# Patient Record
Sex: Female | Born: 1945 | Race: Black or African American | Hispanic: No | Marital: Married | Smoking: Former smoker
Health system: Southern US, Community
[De-identification: ages and names within clinical notes are randomized; demographics above are authoritative.]

## PROBLEM LIST (undated history)

## (undated) DIAGNOSIS — S62102A Fracture of unspecified carpal bone, left wrist, initial encounter for closed fracture: Secondary | ICD-10-CM

## (undated) DIAGNOSIS — M109 Gout, unspecified: Secondary | ICD-10-CM

## (undated) DIAGNOSIS — F0390 Unspecified dementia without behavioral disturbance: Secondary | ICD-10-CM

## (undated) DIAGNOSIS — S62101A Fracture of unspecified carpal bone, right wrist, initial encounter for closed fracture: Secondary | ICD-10-CM

## (undated) DIAGNOSIS — W19XXXA Unspecified fall, initial encounter: Secondary | ICD-10-CM

## (undated) DIAGNOSIS — S02839A Fracture of medial orbital wall, unspecified side, initial encounter for closed fracture: Secondary | ICD-10-CM

## (undated) DIAGNOSIS — I639 Cerebral infarction, unspecified: Secondary | ICD-10-CM

## (undated) DIAGNOSIS — I10 Essential (primary) hypertension: Secondary | ICD-10-CM

## (undated) DIAGNOSIS — R32 Unspecified urinary incontinence: Secondary | ICD-10-CM

## (undated) HISTORY — PX: OTHER SURGICAL HISTORY: SHX169

## (undated) HISTORY — DX: Fracture of medial orbital wall, unspecified side, initial encounter for closed fracture: S02.839A

## (undated) HISTORY — DX: Unspecified fall, initial encounter: W19.XXXA

## (undated) HISTORY — PX: VAGINAL HYSTERECTOMY: SUR661

---

## 1999-06-05 ENCOUNTER — Encounter: Admission: RE | Admit: 1999-06-05 | Discharge: 1999-06-05 | Payer: Self-pay | Admitting: Internal Medicine

## 1999-06-05 ENCOUNTER — Encounter: Payer: Self-pay | Admitting: Internal Medicine

## 2000-06-17 ENCOUNTER — Encounter: Payer: Self-pay | Admitting: Obstetrics and Gynecology

## 2000-06-17 ENCOUNTER — Other Ambulatory Visit: Admission: RE | Admit: 2000-06-17 | Discharge: 2000-06-17 | Payer: Self-pay | Admitting: *Deleted

## 2000-06-17 ENCOUNTER — Encounter: Admission: RE | Admit: 2000-06-17 | Discharge: 2000-06-17 | Payer: Self-pay | Admitting: Obstetrics and Gynecology

## 2001-08-23 ENCOUNTER — Encounter: Payer: Self-pay | Admitting: Neurology

## 2001-08-23 ENCOUNTER — Encounter: Payer: Self-pay | Admitting: Emergency Medicine

## 2001-08-23 ENCOUNTER — Inpatient Hospital Stay (HOSPITAL_COMMUNITY): Admission: EM | Admit: 2001-08-23 | Discharge: 2001-09-05 | Payer: Self-pay | Admitting: Emergency Medicine

## 2001-08-24 ENCOUNTER — Encounter: Payer: Self-pay | Admitting: Neurology

## 2001-08-25 ENCOUNTER — Encounter: Payer: Self-pay | Admitting: Neurology

## 2001-08-26 ENCOUNTER — Encounter: Payer: Self-pay | Admitting: Surgery

## 2001-08-27 ENCOUNTER — Encounter: Payer: Self-pay | Admitting: Surgery

## 2001-08-28 ENCOUNTER — Encounter: Payer: Self-pay | Admitting: Cardiothoracic Surgery

## 2001-08-29 ENCOUNTER — Encounter: Payer: Self-pay | Admitting: Cardiothoracic Surgery

## 2001-08-29 ENCOUNTER — Encounter: Payer: Self-pay | Admitting: Neurology

## 2001-09-04 ENCOUNTER — Encounter: Payer: Self-pay | Admitting: Surgery

## 2001-09-05 ENCOUNTER — Inpatient Hospital Stay (HOSPITAL_COMMUNITY)
Admission: RE | Admit: 2001-09-05 | Discharge: 2001-09-21 | Payer: Self-pay | Admitting: Physical Medicine & Rehabilitation

## 2001-10-03 ENCOUNTER — Encounter
Admission: RE | Admit: 2001-10-03 | Discharge: 2002-01-01 | Payer: Self-pay | Admitting: Physical Medicine & Rehabilitation

## 2002-09-07 ENCOUNTER — Encounter: Payer: Self-pay | Admitting: Internal Medicine

## 2002-09-07 ENCOUNTER — Encounter: Admission: RE | Admit: 2002-09-07 | Discharge: 2002-09-07 | Payer: Self-pay | Admitting: Internal Medicine

## 2003-11-28 ENCOUNTER — Ambulatory Visit (HOSPITAL_COMMUNITY): Admission: RE | Admit: 2003-11-28 | Discharge: 2003-11-28 | Payer: Self-pay | Admitting: General Surgery

## 2005-03-12 ENCOUNTER — Encounter: Admission: RE | Admit: 2005-03-12 | Discharge: 2005-03-12 | Payer: Self-pay | Admitting: Family Medicine

## 2006-08-23 ENCOUNTER — Inpatient Hospital Stay (HOSPITAL_COMMUNITY): Admission: RE | Admit: 2006-08-23 | Discharge: 2006-08-25 | Payer: Self-pay | Admitting: Urology

## 2011-12-18 ENCOUNTER — Other Ambulatory Visit: Payer: Self-pay | Admitting: *Deleted

## 2011-12-18 DIAGNOSIS — N939 Abnormal uterine and vaginal bleeding, unspecified: Secondary | ICD-10-CM

## 2011-12-21 ENCOUNTER — Ambulatory Visit
Admission: RE | Admit: 2011-12-21 | Discharge: 2011-12-21 | Disposition: A | Discharge status: Home / Self Care | Payer: Medicare Other | Source: Ambulatory Visit | Attending: *Deleted | Admitting: *Deleted

## 2011-12-21 ENCOUNTER — Other Ambulatory Visit: Payer: Self-pay | Admitting: Diagnostic Neuroimaging

## 2011-12-21 DIAGNOSIS — N939 Abnormal uterine and vaginal bleeding, unspecified: Secondary | ICD-10-CM

## 2011-12-22 ENCOUNTER — Other Ambulatory Visit: Payer: Self-pay | Admitting: Diagnostic Neuroimaging

## 2011-12-22 DIAGNOSIS — F0390 Unspecified dementia without behavioral disturbance: Secondary | ICD-10-CM

## 2011-12-25 ENCOUNTER — Ambulatory Visit
Admission: RE | Admit: 2011-12-25 | Discharge: 2011-12-25 | Disposition: A | Discharge status: Home / Self Care | Payer: Medicare Other | Source: Ambulatory Visit | Attending: Diagnostic Neuroimaging | Admitting: Diagnostic Neuroimaging

## 2011-12-25 DIAGNOSIS — F0390 Unspecified dementia without behavioral disturbance: Secondary | ICD-10-CM

## 2012-05-17 ENCOUNTER — Inpatient Hospital Stay (HOSPITAL_COMMUNITY)
Admission: EM | Admit: 2012-05-17 | Discharge: 2012-05-21 | DRG: 563 | Disposition: A | Discharge status: Skilled Nursing Facility | Payer: Medicare Other | Attending: Internal Medicine | Admitting: Internal Medicine

## 2012-05-17 ENCOUNTER — Emergency Department (HOSPITAL_COMMUNITY): Payer: Medicare Other

## 2012-05-17 ENCOUNTER — Encounter (HOSPITAL_COMMUNITY): Payer: Self-pay | Admitting: Physical Medicine and Rehabilitation

## 2012-05-17 DIAGNOSIS — I129 Hypertensive chronic kidney disease with stage 1 through stage 4 chronic kidney disease, or unspecified chronic kidney disease: Secondary | ICD-10-CM | POA: Diagnosis present

## 2012-05-17 DIAGNOSIS — N189 Chronic kidney disease, unspecified: Secondary | ICD-10-CM | POA: Diagnosis present

## 2012-05-17 DIAGNOSIS — D72829 Elevated white blood cell count, unspecified: Secondary | ICD-10-CM | POA: Diagnosis present

## 2012-05-17 DIAGNOSIS — W19XXXA Unspecified fall, initial encounter: Secondary | ICD-10-CM | POA: Diagnosis present

## 2012-05-17 DIAGNOSIS — S52501A Unspecified fracture of the lower end of right radius, initial encounter for closed fracture: Secondary | ICD-10-CM

## 2012-05-17 DIAGNOSIS — W06XXXA Fall from bed, initial encounter: Secondary | ICD-10-CM | POA: Diagnosis present

## 2012-05-17 DIAGNOSIS — S0003XA Contusion of scalp, initial encounter: Secondary | ICD-10-CM | POA: Diagnosis present

## 2012-05-17 DIAGNOSIS — N183 Chronic kidney disease, stage 3 unspecified: Secondary | ICD-10-CM | POA: Diagnosis present

## 2012-05-17 DIAGNOSIS — S52599A Other fractures of lower end of unspecified radius, initial encounter for closed fracture: Principal | ICD-10-CM | POA: Diagnosis present

## 2012-05-17 DIAGNOSIS — S02839A Fracture of medial orbital wall, unspecified side, initial encounter for closed fracture: Secondary | ICD-10-CM | POA: Diagnosis present

## 2012-05-17 DIAGNOSIS — F03918 Unspecified dementia, unspecified severity, with other behavioral disturbance: Secondary | ICD-10-CM | POA: Diagnosis present

## 2012-05-17 DIAGNOSIS — N289 Disorder of kidney and ureter, unspecified: Secondary | ICD-10-CM

## 2012-05-17 DIAGNOSIS — F0391 Unspecified dementia with behavioral disturbance: Secondary | ICD-10-CM | POA: Diagnosis present

## 2012-05-17 DIAGNOSIS — S0280XA Fracture of other specified skull and facial bones, unspecified side, initial encounter for closed fracture: Secondary | ICD-10-CM | POA: Diagnosis present

## 2012-05-17 DIAGNOSIS — F0390 Unspecified dementia without behavioral disturbance: Secondary | ICD-10-CM

## 2012-05-17 DIAGNOSIS — S62101A Fracture of unspecified carpal bone, right wrist, initial encounter for closed fracture: Secondary | ICD-10-CM | POA: Diagnosis present

## 2012-05-17 DIAGNOSIS — I160 Hypertensive urgency: Secondary | ICD-10-CM

## 2012-05-17 DIAGNOSIS — F17200 Nicotine dependence, unspecified, uncomplicated: Secondary | ICD-10-CM | POA: Diagnosis present

## 2012-05-17 DIAGNOSIS — I10 Essential (primary) hypertension: Secondary | ICD-10-CM | POA: Diagnosis present

## 2012-05-17 DIAGNOSIS — N179 Acute kidney failure, unspecified: Secondary | ICD-10-CM | POA: Diagnosis present

## 2012-05-17 DIAGNOSIS — S52502A Unspecified fracture of the lower end of left radius, initial encounter for closed fracture: Secondary | ICD-10-CM

## 2012-05-17 HISTORY — DX: Unspecified fall, initial encounter: W19.XXXA

## 2012-05-17 HISTORY — DX: Fracture of unspecified carpal bone, right wrist, initial encounter for closed fracture: S62.101A

## 2012-05-17 HISTORY — DX: Unspecified urinary incontinence: R32

## 2012-05-17 HISTORY — DX: Cerebral infarction, unspecified: I63.9

## 2012-05-17 HISTORY — DX: Unspecified dementia, unspecified severity, without behavioral disturbance, psychotic disturbance, mood disturbance, and anxiety: F03.90

## 2012-05-17 HISTORY — DX: Essential (primary) hypertension: I10

## 2012-05-17 HISTORY — DX: Gout, unspecified: M10.9

## 2012-05-17 HISTORY — DX: Fracture of medial orbital wall, unspecified side, initial encounter for closed fracture: S02.839A

## 2012-05-17 HISTORY — DX: Fracture of unspecified carpal bone, left wrist, initial encounter for closed fracture: S62.102A

## 2012-05-17 LAB — URINALYSIS, ROUTINE W REFLEX MICROSCOPIC
Bilirubin Urine: NEGATIVE
Glucose, UA: NEGATIVE mg/dL

## 2012-05-17 LAB — CBC WITH DIFFERENTIAL/PLATELET
Basophils Relative: 0 % (ref 0–1)
Eosinophils Absolute: 0.1 10*3/uL (ref 0.0–0.7)
Eosinophils Relative: 0 % (ref 0–5)
Lymphocytes Relative: 11 % — ABNORMAL LOW (ref 12–46)
MCH: 30.2 pg (ref 26.0–34.0)
Monocytes Relative: 4 % (ref 3–12)
RBC: 4.21 MIL/uL (ref 3.87–5.11)
RDW: 12.8 % (ref 11.5–15.5)
WBC: 12 10*3/uL — ABNORMAL HIGH (ref 4.0–10.5)

## 2012-05-17 LAB — COMPREHENSIVE METABOLIC PANEL
Albumin: 4.5 g/dL (ref 3.5–5.2)
BUN: 26 mg/dL — ABNORMAL HIGH (ref 6–23)
Calcium: 9.9 mg/dL (ref 8.4–10.5)
Chloride: 104 mEq/L (ref 96–112)
Creatinine, Ser: 1.28 mg/dL — ABNORMAL HIGH (ref 0.50–1.10)
GFR calc Af Amer: 49 mL/min — ABNORMAL LOW (ref >90)
GFR calc non Af Amer: 42 mL/min — ABNORMAL LOW (ref >90)
Total Bilirubin: 0.5 mg/dL (ref 0.3–1.2)
Total Protein: 7.8 g/dL (ref 6.0–8.3)

## 2012-05-17 LAB — PROTIME-INR
INR: 1.07 (ref 0.00–1.49)
Prothrombin Time: 13.8 seconds (ref 11.6–15.2)

## 2012-05-17 MED ORDER — MEMANTINE HCL ER 28 MG PO CP24: 28.0000 mg | ORAL_CAPSULE | Freq: Every day | ORAL | Status: DC

## 2012-05-17 MED ORDER — ACETAMINOPHEN 325 MG PO TABS: 650.0000 mg | ORAL_TABLET | Freq: Four times a day (QID) | ORAL | Status: DC | PRN

## 2012-05-17 MED ORDER — ONDANSETRON HCL 4 MG PO TABS: 4.0000 mg | ORAL_TABLET | Freq: Four times a day (QID) | ORAL | Status: DC | PRN

## 2012-05-17 MED ORDER — LABETALOL HCL 5 MG/ML IV SOLN: 20.0000 mg | Freq: Once | INTRAVENOUS | Status: DC

## 2012-05-17 MED ORDER — HYDRALAZINE HCL 20 MG/ML IJ SOLN
10.0000 mg | Freq: Four times a day (QID) | INTRAMUSCULAR | Status: DC | PRN
  Administered 2012-05-17: 0.5 mg via INTRAVENOUS
  Administered 2012-05-19: 10 mg via INTRAVENOUS
  Filled 2012-05-17 (×3): qty 0.5

## 2012-05-17 MED ORDER — DONEPEZIL HCL 10 MG PO TABS
10.0000 mg | ORAL_TABLET | Freq: Every day | ORAL | Status: DC
  Administered 2012-05-18 – 2012-05-20 (×3): 10 mg via ORAL
  Filled 2012-05-17 (×5): qty 1

## 2012-05-17 MED ORDER — ZIPRASIDONE MESYLATE 20 MG IM SOLR
10.0000 mg | Freq: Once | INTRAMUSCULAR | Status: AC
  Administered 2012-05-17: 10 mg via INTRAMUSCULAR

## 2012-05-17 MED ORDER — ONDANSETRON HCL 4 MG/2ML IJ SOLN
4.0000 mg | Freq: Once | INTRAMUSCULAR | Status: AC
  Administered 2012-05-17: 4 mg via INTRAVENOUS

## 2012-05-17 MED ORDER — SODIUM CHLORIDE 0.9 % IV SOLN
INTRAVENOUS | Status: AC
  Administered 2012-05-17 – 2012-05-18 (×2): via INTRAVENOUS

## 2012-05-17 MED ORDER — MORPHINE SULFATE 4 MG/ML IJ SOLN
INTRAMUSCULAR | Status: AC
  Filled 2012-05-17: qty 1

## 2012-05-17 MED ORDER — ZIPRASIDONE MESYLATE 20 MG IM SOLR
10.0000 mg | Freq: Once | INTRAMUSCULAR | Status: AC
  Administered 2012-05-17: 10 mg via INTRAMUSCULAR
  Filled 2012-05-17: qty 20

## 2012-05-17 MED ORDER — ACETAMINOPHEN 650 MG RE SUPP: 650.0000 mg | Freq: Four times a day (QID) | RECTAL | Status: DC | PRN

## 2012-05-17 MED ORDER — MORPHINE SULFATE 4 MG/ML IJ SOLN
4.0000 mg | Freq: Once | INTRAMUSCULAR | Status: AC
  Administered 2012-05-17: 4 mg via INTRAVENOUS

## 2012-05-17 MED ORDER — ONDANSETRON HCL 4 MG/2ML IJ SOLN: 4.0000 mg | Freq: Four times a day (QID) | INTRAMUSCULAR | Status: DC | PRN

## 2012-05-17 MED ORDER — MORPHINE SULFATE 2 MG/ML IJ SOLN
1.0000 mg | INTRAMUSCULAR | Status: DC | PRN
  Administered 2012-05-20 – 2012-05-21 (×2): 2 mg via INTRAVENOUS
  Filled 2012-05-17 (×2): qty 1

## 2012-05-17 MED ORDER — SODIUM CHLORIDE 0.9 % IV SOLN: INTRAVENOUS | Status: DC

## 2012-05-17 MED ORDER — LORAZEPAM 2 MG/ML IJ SOLN
1.0000 mg | Freq: Four times a day (QID) | INTRAMUSCULAR | Status: DC | PRN
  Administered 2012-05-17 – 2012-05-20 (×5): 1 mg via INTRAVENOUS
  Filled 2012-05-17 (×5): qty 1

## 2012-05-17 MED ORDER — LABETALOL HCL 300 MG PO TABS
300.0000 mg | ORAL_TABLET | Freq: Two times a day (BID) | ORAL | Status: DC
  Administered 2012-05-18 – 2012-05-21 (×7): 300 mg via ORAL
  Filled 2012-05-17 (×11): qty 1

## 2012-05-17 MED ORDER — SODIUM CHLORIDE 0.9 % IV SOLN: Freq: Once | INTRAVENOUS | Status: DC

## 2012-05-17 MED ORDER — LIDOCAINE HCL (PF) 1 % IJ SOLN
10.0000 mL | Freq: Once | INTRAMUSCULAR | Status: AC
  Administered 2012-05-17: 10 mL

## 2012-05-17 MED ORDER — MEMANTINE HCL 10 MG PO TABS
10.0000 mg | ORAL_TABLET | Freq: Two times a day (BID) | ORAL | Status: DC
  Administered 2012-05-18 – 2012-05-21 (×7): 10 mg via ORAL
  Filled 2012-05-17 (×9): qty 1

## 2012-05-17 MED ORDER — SENNOSIDES-DOCUSATE SODIUM 8.6-50 MG PO TABS: 1.0000 | ORAL_TABLET | Freq: Every evening | ORAL | Status: DC | PRN

## 2012-05-17 MED ORDER — CLONIDINE HCL 0.1 MG PO TABS
0.1000 mg | ORAL_TABLET | Freq: Two times a day (BID) | ORAL | Status: DC
  Administered 2012-05-18 – 2012-05-21 (×7): 0.1 mg via ORAL
  Filled 2012-05-17 (×9): qty 1

## 2012-05-17 MED ORDER — ONDANSETRON HCL 4 MG/2ML IJ SOLN
INTRAMUSCULAR | Status: AC
  Filled 2012-05-17: qty 2

## 2012-05-17 MED ORDER — ATORVASTATIN CALCIUM 10 MG PO TABS
10.0000 mg | ORAL_TABLET | Freq: Every day | ORAL | Status: DC
  Administered 2012-05-18 – 2012-05-21 (×4): 10 mg via ORAL
  Filled 2012-05-17 (×6): qty 1

## 2012-05-17 MED ORDER — LIDOCAINE HCL (PF) 1 % IJ SOLN
INTRAMUSCULAR | Status: AC
  Filled 2012-05-17: qty 5

## 2012-05-17 NOTE — Progress Notes (Signed)
Orthopedic Tech Progress Note Patient Details:  Amanda [name] 1946/03/19 [id number] Bilateral splints applied to wrists. Left: Thumb spica and ulna-gutter Right:Thumb spica and dorsal volar. Family present in room with patient. Ortho Devices Type of Ortho Device: Ulna gutter splint;Thumb spica splint;Volar splint Splint Material: Fiberglass Ortho Device/Splint Location: Bilateral Ortho Device/Splint Interventions: Application   Asia R Thompson 05/17/2012, 12:41 PM

## 2012-05-17 NOTE — ED Notes (Addendum)
Pt presents to department for evaluation of fall. Was found by son lying on floor near stairs this morning. Abrasion to forehead and L eyelid. Swelling also noted to L eye. Obvious deformity/swelling noted to L wrist. Able to move all extremities. Can answer simple questions without difficulty. Son denies any mental status changes, states "she is acting like her usual self." pt does not take blood thinners.

## 2012-05-17 NOTE — ED Notes (Signed)
Pt sitting on stretcher with family at bedside. Pt continues to yell out stating "get me off this pot, I'm done" Pt referring to bed side toilet that was removed from her room earlier.

## 2012-05-17 NOTE — Progress Notes (Signed)
Orthopedic Tech Progress Note Patient Details:  Amanda [name] 1946/03/24 [id number] Sugartong splint applied to Left wrist/arm after re-set. Doctor in room to set arm and assist with application of splint. Nurse also present in room.  Ortho Devices Type of Ortho Device: Sugartong splint Splint Material: Plaster Ortho Device/Splint Location: Left Ortho Device/Splint Interventions: Application   Asia R Thompson 05/17/2012, 2:30 PM

## 2012-05-17 NOTE — ED Notes (Addendum)
Family leaving and will return Amanda [name], husband,  [phone]

## 2012-05-17 NOTE — ED Notes (Signed)
Dr.Kuzma in room at this time. Ortho tech called

## 2012-05-17 NOTE — ED Notes (Signed)
Pt agitated and aggressive toward staff. Refuses IV and attempting to get OOB. EDP notified. Pt using BUE. Lt wrist swollen with deformity, pt refuses to allow RN to check vascular status of Lt wrist/hand.

## 2012-05-17 NOTE — ED Provider Notes (Signed)
History     CSN: [id number]  Arrival date & time 05/17/12  0800   First MD Initiated Contact with Patient 05/17/12 [phone]      Chief Complaint  Patient presents with  . Fall    (Consider location/radiation/quality/duration/timing/severity/associated sxs/prior treatment) HPI Comments: Pt's family says that pt fell this morning, suffering abrasions of the left side of the face, has pain in both wrists.  She reportedly is in early stages of dementia.  Patient is a 66 y.o. female presenting with fall.  Fall The accident occurred less than 1 hour ago. She fell from a height of 1 to 2 ft. She landed on carpet. The volume of blood lost was minimal. Point of impact: Face, both wrists. Pain scale: Does not appear to be in pain, does not complain of pain. She was not ambulatory at the scene. There was no entrapment after the fall. There was no drug use involved in the accident. There was no alcohol use involved in the accident. She has tried nothing (To [facility] via private car.) for the symptoms.    Past Medical History  Diagnosis Date  . Hypertension   . Dementia     No past surgical history on file.  No family history on file.  History  Substance Use Topics  . Smoking status: Never Smoker   . Smokeless tobacco: Not on file  . Alcohol Use: No    OB History    Grav Para Term Preterm Abortions TAB SAB Ect Mult Living                  Review of Systems  Unable to perform ROS: Dementia    Allergies  Review of patient's allergies indicates no known allergies.  Home Medications   Current Outpatient Rx  Name Route Sig Dispense Refill  . ATORVASTATIN CALCIUM 10 MG PO TABS Oral Take 10 mg by mouth daily.    Marland Kitchen CLONIDINE HCL 0.1 MG PO TABS Oral Take 0.1 mg by mouth 2 (two) times daily.    . DONEPEZIL HCL 10 MG PO TABS Oral Take 10 mg by mouth at bedtime.    Marland Kitchen LABETALOL HCL 300 MG PO TABS Oral Take 300 mg by mouth 2 (two) times daily.    Marland Kitchen MEMANTINE HCL ER 28 MG PO CP24  Oral Take 28 mg by mouth daily.      BP 193/87  Pulse 62  Resp 14  SpO2 100%  Physical Exam  Nursing note and vitals reviewed. Constitutional: She appears well-developed and well-nourished. No distress.       Has abrasion involving the left forehead, left upper and lower eyelids, with swelling and ecchymosis in the area of the left orbit.  No intrinsic left eye injury apparent.  HENT:  Head: Normocephalic and atraumatic.  Right Ear: External ear normal.  Left Ear: External ear normal.  Mouth/Throat: Oropharynx is clear and moist.  Eyes: EOM are normal. Pupils are equal, round, and reactive to light.  Neck: Normal range of motion. Neck supple.  Cardiovascular: Normal rate, regular rhythm and normal heart sounds.   Pulmonary/Chest: Effort normal and breath sounds normal.  Abdominal: Soft. Bowel sounds are normal. There is no tenderness. There is no rebound and no guarding.  Musculoskeletal:       She has an obvious fracture of the left distal radius with dorsal angulation.  She also has mild swelling over the dorsum of the right distal radius.   Neurological: She is alert.  Awake, answers some questions. No sensory or motor deficit.  Skin: Skin is warm and dry.  Psychiatric: She has a normal mood and affect. Her behavior is normal.    ED Course  Procedures (including critical care time)   Labs Reviewed  CBC WITH DIFFERENTIAL  COMPREHENSIVE METABOLIC PANEL  URINALYSIS, ROUTINE W REFLEX MICROSCOPIC  URINE CULTURE  PROTIME-INR  APTT   Dg Chest 2 View  05/17/2012  *RADIOLOGY REPORT*  Clinical Data: Fall, hypertension, smoker, dementia  CHEST - 2 VIEW  Comparison: 08/23/2006  Findings: Enlargement of cardiac silhouette post median sternotomy. Calcified tortuous thoracic aorta with question aneurysmal dilatation of the aortic arch. Prominent right superior mediastinal soft tissues unchanged, potentially related to a tortuous innominate artery. Pulmonary vascularity normal.  Lungs clear. No pleural effusion or pneumothorax. Diffuse osseous demineralization.  IMPRESSION: Enlargement of cardiac silhouette post median sternotomy. Atherosclerotic changes of the thoracic aorta with question mild aneurysmal dilatation at the aortic arch.   Original Report Authenticated By: Lollie Marrow, M.D.    Dg Wrist Complete Left  05/17/2012  *RADIOLOGY REPORT*  Clinical Data:  Left wrist pain post fall  LEFT WRIST - COMPLETE 3+ VIEW  Comparison: None  Findings: Diffuse osseous demineralization. Displaced ulnar styloid fracture. Comminuted displaced intra-articular fracture distal left radial metaphysis with intra-articular extension into the radiocarpal and questionably distal radioulnar joints. Dorsal displacement of the distal radial fragments with apex volar angulation. Associated soft tissue swelling. No definite carpal fractures identified.  IMPRESSION: Displaced ulnar styloid fracture. Displaced angulated comminuted intra-articular distal left radial metaphyseal fracture.   Original Report Authenticated By: Lollie Marrow, M.D.    Dg Wrist Complete Right  05/17/2012  *RADIOLOGY REPORT*  Clinical Data: Fall, bilateral wrist injuries, struck right frontal region, history dementia, hypertension  RIGHT WRIST - COMPLETE 3+ VIEW  Comparison: None  Findings: Osseous demineralization. Comminuted metaphyseal fracture distal right radius with dorsal displacement of distal radial dorsal fragments. Overlying soft tissue swelling at the distal forearm and carpus. Joint spaces preserved. No additional fracture or dislocation identified.  IMPRESSION: Minimally displaced and comminuted distal right radial metaphyseal fracture. Osseous demineralization.   Original Report Authenticated By: Lollie Marrow, M.D.    Ct Head Wo Contrast  05/17/2012  *RADIOLOGY REPORT*  Clinical Data:  Dementia, fall, found on floor, frontal abrasion, left periorbital swelling  CT HEAD WITHOUT CONTRAST CT CERVICAL SPINE WITHOUT  CONTRAST  Technique:  Multidetector CT imaging of the head and cervical spine was performed following the standard protocol without intravenous contrast.  Multiplanar CT image reconstructions of the cervical spine were also generated.  Comparison:  CT head 12/25/2011  CT HEAD  Findings: Generalized atrophy. Stable ventricular morphology. No midline shift or mass effect. Small vessel chronic ischemic changes of deep cerebral white matter. 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 No intracranial hemorrhage, mass lesion or evidence of acute infarction. Small frontal scalp hematoma. Minimal motion artifacts at vertex, for which repeat imaging was performed. Atherosclerotic calcifications at skull base. Partially opacified ethmoid air cells and left frontal sinus with displaced fracture of the medial wall of the left orbit. Inferior aspect of left orbit not imaged. No definite calvarial fracture identified.  IMPRESSION: Atrophy with small vessel chronic ischemic changes of deep cerebral white matter. 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 No acute intracranial abnormalities. Displaced fracture medial wall left orbit, new.  CT CERVICAL SPINE  Findings: Prevertebral soft tissues normal thickness. Multilevel disc space narrowing and endplate spur formation. Multilevel facet degenerative changes bilaterally greatest at left  C3-C4. Vertebral body heights maintained without fracture or subluxation. Visualized skull base intact. Lung apices clear. Scattered atherosclerotic calcifications in the carotid systems bilaterally.  IMPRESSION:  Degenerative disc and facet disease changes of the cervical spine as above. No acute cervical spine abnormalities.   Original Report Authenticated By: Lollie Marrow, M.D.    Ct Cervical Spine Wo Contrast  05/17/2012  *RADIOLOGY REPORT*  Clinical Data:  Dementia, fall, found on floor, frontal abrasion, left periorbital swelling  CT HEAD WITHOUT CONTRAST CT CERVICAL SPINE WITHOUT CONTRAST  Technique:   Multidetector CT imaging of the head and cervical spine was performed following the standard protocol without intravenous contrast.  Multiplanar CT image reconstructions of the cervical spine were also generated.  Comparison:  CT head 12/25/2011  CT HEAD  Findings: Generalized atrophy. Stable ventricular morphology. No midline shift or mass effect. Small vessel chronic ischemic changes of deep cerebral white matter. 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 No intracranial hemorrhage, mass lesion or evidence of acute infarction. Small frontal scalp hematoma. Minimal motion artifacts at vertex, for which repeat imaging was performed. Atherosclerotic calcifications at skull base. Partially opacified ethmoid air cells and left frontal sinus with displaced fracture of the medial wall of the left orbit. Inferior aspect of left orbit not imaged. No definite calvarial fracture identified.  IMPRESSION: Atrophy with small vessel chronic ischemic changes of deep cerebral white matter. 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 No acute intracranial abnormalities. Displaced fracture medial wall left orbit, new.  CT CERVICAL SPINE  Findings: Prevertebral soft tissues normal thickness. Multilevel disc space narrowing and endplate spur formation. Multilevel facet degenerative changes bilaterally greatest at left C3-C4. Vertebral body heights maintained without fracture or subluxation. Visualized skull base intact. Lung apices clear. Scattered atherosclerotic calcifications in the carotid systems bilaterally.  IMPRESSION:  Degenerative disc and facet disease changes of the cervical spine as above. No acute cervical spine abnormalities.   Original Report Authenticated By: Lollie Marrow, M.D.      Date: 05/17/2012  Rate: 55  Rhythm: normal sinus rhythm  QRS Axis: normal  Intervals: PR prolonged QRS:  Left ventricular hypertrophy.  Left atrial abnormality  ST/T Wave abnormalities: normal  Conduction Disutrbances:none  Narrative  Interpretation: Abnormal EKG  Old EKG Reviewed: none available     11:13 AM Pt has fractures of both wrists, left with significant displacement.  She has hypertensive urgency with Diastolic BP 112 in room at present.  Will need IV  Labetalol.  11:42 AM Now is agitated and combative.  IM Geodon 10 mg IM ordered.  2:26 PM Pt has had her fractures casted.  Her BP remains high.  Will request admission for her for hypertensive urgency.   1. Hypertensive urgency   2. Fracture of left distal radius   3. Fracture of right distal radius   4. Dementia           Carleene Cooper III, MD 05/17/12 1547  Carleene Cooper III, MD 05/29/12 1336

## 2012-05-17 NOTE — ED Notes (Addendum)
Family at bed side with pt. Ortho teck in room attempting to apply BUE splints. Pt room at nurse station with door and windows open.

## 2012-05-17 NOTE — H&P (Signed)
Patient's PCP: Katy Apo, MD  Chief Complaint: Fall and bilateral wrist pain  History of Present Illness: Amanda [name] is a 66 y.o. African American female with history of hypertension and dementia who presents with the above complaints.  Patient due to her dementia is not able to provide any history.  Most of the history was obtained from patient's husband.  He reports that this morning patient went up a flight of steps to use the bathroom and he believes that on her way back she had a mechanical fall and landed forward on her wrists and left forehead.  Husband heard her fall and hurt her crying out saying that she is unable to get up.  She was brought to the emergency department where on imaging patient had bilateral wrist fracture.  Given her uncontrolled hypertension the hospitalist service was asked to admit the patient for Dr. Nehemiah Settle for further care and management.   Past Medical History  Diagnosis Date  . Hypertension   . Dementia    History reviewed. No pertinent past surgical history. Family History  Problem Relation Age of Onset  . Dementia Mother   . Dementia Father   . Dementia Sister   . Dementia Brother    History   Social History  . Marital Status: Married    Spouse Name: N/A    Number of Children: N/A  . Years of Education: N/A   Occupational History  . Not on file.   Social History Main Topics  . Smoking status: Current Some Day Smoker -- 0.7 packs/day  . Smokeless tobacco: Not on file  . Alcohol Use: No  . Drug Use: No  . Sexually Active:    Other Topics Concern  . Not on file   Social History Narrative  . No narrative on file   Allergies: Review of patient's allergies indicates no known allergies.  Meds: Scheduled Meds:   . sodium chloride   Intravenous Once  . cloNIDine  0.1 mg Oral BID  . labetalol  300 mg Oral BID  . lidocaine  10 mL Other Once  .  morphine injection  4 mg Intravenous Once  . ondansetron (ZOFRAN) IV  4 mg  Intravenous Once  . ziprasidone  10 mg Intramuscular Once  . ziprasidone  10 mg Intramuscular Once  . DISCONTD: labetalol  20 mg Intravenous Once  . DISCONTD: labetalol  20 mg Intravenous Once   Continuous Infusions:   . sodium chloride     PRN Meds:.  Review of Systems: All systems reviewed with the patient and positive as per history of present illness, otherwise all other systems are negative.  Physical Exam: Blood pressure 186/76, pulse 63, resp. rate 10, SpO2 100.00%. General: Awake, Oriented to self, No acute distress. HEENT: EOMI, Moist mucous membranes, bruising and carpet burns over left forehead which extends from the maxilla to the scalp Neck: Supple CV: S1 and S2 Lungs: Clear to ascultation bilaterally Abdomen: Soft, Nontender, Nondistended, +bowel sounds. Ext: Good pulses. Trace edema. No clubbing or cyanosis noted.  Both left and right wrists in bandages. Neuro: Cranial Nerves II-XII grossly intact. Has 5/5 motor strength in upper and lower extremities.  Lab results:  [facility] 05/17/12 1042  NA 138  K 3.6  CL 104  CO2 20  GLUCOSE 104*  BUN 26*  CREATININE 1.28*  CALCIUM 9.9  MG --  PHOS --    Basename 05/17/12 1042  AST 33  ALT 24  ALKPHOS 84  BILITOT 0.5  PROT 7.8  ALBUMIN 4.5   No results found for this basename: LIPASE:2,AMYLASE:2 in the last 72 hours  Basename 05/17/12 1042  WBC 12.0*  NEUTROABS 10.1*  HGB 12.7  HCT 38.3  MCV 91.0  PLT 160   No results found for this basename: CKTOTAL:3,CKMB:3,CKMBINDEX:3,TROPONINI:3 in the last 72 hours No components found with this basename: POCBNP:3 No results found for this basename: DDIMER in the last 72 hours No results found for this basename: HGBA1C:2 in the last 72 hours No results found for this basename: CHOL:2,HDL:2,LDLCALC:2,TRIG:2,CHOLHDL:2,LDLDIRECT:2 in the last 72 hours No results found for this basename: TSH,T4TOTAL,FREET3,T3FREE,THYROIDAB in the last 72 hours No results found for  this basename: VITAMINB12:2,FOLATE:2,FERRITIN:2,TIBC:2,IRON:2,RETICCTPCT:2 in the last 72 hours Imaging results:  Dg Chest 2 View  05/17/2012  *RADIOLOGY REPORT*  Clinical Data: Fall, hypertension, smoker, dementia  CHEST - 2 VIEW  Comparison: 08/23/2006  Findings: Enlargement of cardiac silhouette post median sternotomy. Calcified tortuous thoracic aorta with question aneurysmal dilatation of the aortic arch. Prominent right superior mediastinal soft tissues unchanged, potentially related to a tortuous innominate artery. Pulmonary vascularity normal. Lungs clear. No pleural effusion or pneumothorax. Diffuse osseous demineralization.  IMPRESSION: Enlargement of cardiac silhouette post median sternotomy. Atherosclerotic changes of the thoracic aorta with question mild aneurysmal dilatation at the aortic arch.   Original Report Authenticated By: Lollie Marrow, M.D.    Dg Wrist 2 Views Left  05/17/2012  *RADIOLOGY REPORT*  Clinical Data: Wrist fracture, post reduction  LEFT WRIST - 2 VIEW  Comparison: 05/17/2012  Findings: Cast material noted about the wrist.  Improved alignment of the left distal radius and ulnar styloid fractures.  Mild soft tissue swelling.  IMPRESSION: Near anatomic alignment following reduction of the left distal radius and ulnar styloid fractures   Original Report Authenticated By: Judie Petit. Ruel Favors, M.D.    Dg Wrist Complete Left  05/17/2012  *RADIOLOGY REPORT*  Clinical Data:  Left wrist pain post fall  LEFT WRIST - COMPLETE 3+ VIEW  Comparison: None  Findings: Diffuse osseous demineralization. Displaced ulnar styloid fracture. Comminuted displaced intra-articular fracture distal left radial metaphysis with intra-articular extension into the radiocarpal and questionably distal radioulnar joints. Dorsal displacement of the distal radial fragments with apex volar angulation. Associated soft tissue swelling. No definite carpal fractures identified.  IMPRESSION: Displaced ulnar styloid  fracture. Displaced angulated comminuted intra-articular distal left radial metaphyseal fracture.   Original Report Authenticated By: Lollie Marrow, M.D.    Dg Wrist Complete Right  05/17/2012  *RADIOLOGY REPORT*  Clinical Data: Fall, bilateral wrist injuries, struck right frontal region, history dementia, hypertension  RIGHT WRIST - COMPLETE 3+ VIEW  Comparison: None  Findings: Osseous demineralization. Comminuted metaphyseal fracture distal right radius with dorsal displacement of distal radial dorsal fragments. Overlying soft tissue swelling at the distal forearm and carpus. Joint spaces preserved. No additional fracture or dislocation identified.  IMPRESSION: Minimally displaced and comminuted distal right radial metaphyseal fracture. Osseous demineralization.   Original Report Authenticated By: Lollie Marrow, M.D.    Ct Head Wo Contrast  05/17/2012  *RADIOLOGY REPORT*  Clinical Data:  Dementia, fall, found on floor, frontal abrasion, left periorbital swelling  CT HEAD WITHOUT CONTRAST CT CERVICAL SPINE WITHOUT CONTRAST  Technique:  Multidetector CT imaging of the head and cervical spine was performed following the standard protocol without intravenous contrast.  Multiplanar CT image reconstructions of the cervical spine were also generated.  Comparison:  CT head 12/25/2011  CT HEAD  Findings: Generalized atrophy. Stable ventricular morphology. No midline  shift or mass effect. Small vessel chronic ischemic changes of deep cerebral white matter. 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 No intracranial hemorrhage, mass lesion or evidence of acute infarction. Small frontal scalp hematoma. Minimal motion artifacts at vertex, for which repeat imaging was performed. Atherosclerotic calcifications at skull base. Partially opacified ethmoid air cells and left frontal sinus with displaced fracture of the medial wall of the left orbit. Inferior aspect of left orbit not imaged. No definite calvarial fracture identified.   IMPRESSION: Atrophy with small vessel chronic ischemic changes of deep cerebral white matter. 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 No acute intracranial abnormalities. Displaced fracture medial wall left orbit, new.  CT CERVICAL SPINE  Findings: Prevertebral soft tissues normal thickness. Multilevel disc space narrowing and endplate spur formation. Multilevel facet degenerative changes bilaterally greatest at left C3-C4. Vertebral body heights maintained without fracture or subluxation. Visualized skull base intact. Lung apices clear. Scattered atherosclerotic calcifications in the carotid systems bilaterally.  IMPRESSION:  Degenerative disc and facet disease changes of the cervical spine as above. No acute cervical spine abnormalities.   Original Report Authenticated By: Lollie Marrow, M.D.    Ct Cervical Spine Wo Contrast  05/17/2012  *RADIOLOGY REPORT*  Clinical Data:  Dementia, fall, found on floor, frontal abrasion, left periorbital swelling  CT HEAD WITHOUT CONTRAST CT CERVICAL SPINE WITHOUT CONTRAST  Technique:  Multidetector CT imaging of the head and cervical spine was performed following the standard protocol without intravenous contrast.  Multiplanar CT image reconstructions of the cervical spine were also generated.  Comparison:  CT head 12/25/2011  CT HEAD  Findings: Generalized atrophy. Stable ventricular morphology. No midline shift or mass effect. Small vessel chronic ischemic changes of deep cerebral white matter. 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 No intracranial hemorrhage, mass lesion or evidence of acute infarction. Small frontal scalp hematoma. Minimal motion artifacts at vertex, for which repeat imaging was performed. Atherosclerotic calcifications at skull base. Partially opacified ethmoid air cells and left frontal sinus with displaced fracture of the medial wall of the left orbit. Inferior aspect of left orbit not imaged. No definite calvarial fracture identified.  IMPRESSION: Atrophy  with small vessel chronic ischemic changes of deep cerebral white matter. 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 No acute intracranial abnormalities. Displaced fracture medial wall left orbit, new.  CT CERVICAL SPINE  Findings: Prevertebral soft tissues normal thickness. Multilevel disc space narrowing and endplate spur formation. Multilevel facet degenerative changes bilaterally greatest at left C3-C4. Vertebral body heights maintained without fracture or subluxation. Visualized skull base intact. Lung apices clear. Scattered atherosclerotic calcifications in the carotid systems bilaterally.  IMPRESSION:  Degenerative disc and facet disease changes of the cervical spine as above. No acute cervical spine abnormalities.   Original Report Authenticated By: Lollie Marrow, M.D.    Other results: EKG: normal sinus rhythm.  Assessment & Plan by Problem: Bilateral wrist fractures Right wrist x-ray on 05/17/2012 shows a minimally displaced and comminuted distal right radial metaphyseal fracture.  Left wrist x-ray on 05/17/2012 showed displaced ulnar styloid fracture and displaced angulated comminuted intra-articular distal left radial metaphyseal fracture. Dr. Merlyn Lot, hand ortho, evaluated the patient and reduce the fractures and splinted the wrist.  Further workup as per Dr. Merlyn Lot. Patient management with PRN morphine.  Requests PT/OT consultation.  Fall Appears to be mechanical.  EKG shows normal sinus rhythm.  CT of the head and C-spine shows no acute intracranial abnormalities and no acute cervical spine abnormalities.  No further workup.  Displaced fracture medial wall left  orbit Discussed with Dr. Clarisa Kindred, ophthalmology, recommended conservative management.  She indicated that the fracture will heal on its own.  Recommended having the patient followup with her ophthalmologist after discharge.  Uncontrolled hypertension Likely due to pain and due to rebound hypertension from lack of her home medications  this morning.  Restart the patient on home antihypertensive medications.  When necessary hydralazine for SBP greater than 160.  Renal insufficiency Uncertain if acute or chronic.  Gently hydrate the patient on IV fluids and reassess renal function tomorrow.  Leukocytosis Likely reactive from the fall and fractures.  Chest x-ray and urinalysis not suggestive of infectious etiology.  Question mild aneurysmal dilatation of aortic arch on chest x-ray Further workup as per her primary care physician as outpatient.  Left forehead bruise from the fall Conservative management.  Dementia with emotional outbursts Patient given two doses of Zyprexa done in the emergency department.  When necessary and for agitation and anxiety.  Prophylaxis SCDs.  CODE STATUS Full code.  This was discussed with husband, son and patient account admission.  Disposition Admit the patient to inpatient as MedSurg.  Husband expressed wanting to have the patient in rehabilitation.  Patient to be transferred to Dr. Idelle Crouch service on 05/18/2012 at 7:00 AM called office and left message.  Time spent on admission, talking to the patient, and coordinating care was: 60 mins.  Meena Barrantes A, MD 05/17/2012, 4:02 PM

## 2012-05-17 NOTE — ED Notes (Signed)
Pt sitting on stretcher, yelling and cursing at family and staff. Pt becoming aggressive with staff. EDP notified

## 2012-05-17 NOTE — ED Notes (Addendum)
Ulnar gutter & thumb spica placed to Lt wrist by ortho tech Thumb spica to Rt wrist by ortho tech

## 2012-05-17 NOTE — Progress Notes (Signed)
Orthopedic Tech Progress Note Patient Details:  Amanda [name] 1946-06-25 [id number] **Note that patient was not charged twice for ace bandages applied during second splint of Left arm (Sugartong splint application). Doctor applied same ace wraps used in temporary splint to patient since they were applied less than 2 hours earlier and were clean. Patient ID: Amanda [name], female   DOB: 1946-08-02, 66 y.o.   MRN: [id number]   Orie Rout 05/17/2012, 2:33 PM

## 2012-05-17 NOTE — Consult Note (Signed)
Amanda [name] is an 66 y.o. female.   Chief Complaint: bilateral distal radius fractures HPI: 66 yo rhd female fell while getting out of bed this morning.  Injury to both wrists.  Brought to [facility] where XR revealed bilateral distal radius fractures with displacement of left.  Reports no previous injuries to wrists.    Past Medical History  Diagnosis Date  . Hypertension   . Dementia     History reviewed. No pertinent past surgical history.  Family History  Problem Relation Age of Onset  . Dementia Mother   . Dementia Father   . Dementia Sister   . Dementia Brother    Social History:  reports that she has been smoking.  She does not have any smokeless tobacco history on file. She reports that she does not drink alcohol or use illicit drugs.  Allergies: No Known Allergies  Medications Prior to Admission  Medication Sig Dispense Refill  . atorvastatin (LIPITOR) 10 MG tablet Take 10 mg by mouth daily.      . cloNIDine (CATAPRES) 0.1 MG tablet Take 0.1 mg by mouth 2 (two) times daily.      Marland Kitchen donepezil (ARICEPT) 10 MG tablet Take 10 mg by mouth at bedtime.      Marland Kitchen labetalol (NORMODYNE) 300 MG tablet Take 300 mg by mouth 2 (two) times daily.      . Memantine HCl ER (NAMENDA XR) 28 MG CP24 Take 28 mg by mouth daily.        Results for orders placed during the hospital encounter of 05/17/12 (from the past 48 hour(s))  CBC WITH DIFFERENTIAL     Status: Abnormal   Collection Time   05/17/12 10:42 AM      Component Value Range Comment   WBC 12.0 (*) 4.0 - 10.5 K/uL    RBC 4.21  3.87 - 5.11 MIL/uL    Hemoglobin 12.7  12.0 - 15.0 g/dL    HCT 62.1  30.8 - 65.7 %    MCV 91.0  78.0 - 100.0 fL    MCH 30.2  26.0 - 34.0 pg    MCHC 33.2  30.0 - 36.0 g/dL    RDW 84.6  96.2 - 95.2 %    Platelets 160  150 - 400 K/uL    Neutrophils Relative 84 (*) 43 - 77 %    Neutro Abs 10.1 (*) 1.7 - 7.7 K/uL    Lymphocytes Relative 11 (*) 12 - 46 %    Lymphs Abs 1.3  0.7 - 4.0 K/uL    Monocytes  Relative 4  3 - 12 %    Monocytes Absolute 0.5  0.1 - 1.0 K/uL    Eosinophils Relative 0  0 - 5 %    Eosinophils Absolute 0.1  0.0 - 0.7 K/uL    Basophils Relative 0  0 - 1 %    Basophils Absolute 0.0  0.0 - 0.1 K/uL   COMPREHENSIVE METABOLIC PANEL     Status: Abnormal   Collection Time   05/17/12 10:42 AM      Component Value Range Comment   Sodium 138  135 - 145 mEq/L    Potassium 3.6  3.5 - 5.1 mEq/L    Chloride 104  96 - 112 mEq/L    CO2 20  19 - 32 mEq/L    Glucose, Bld 104 (*) 70 - 99 mg/dL    BUN 26 (*) 6 - 23 mg/dL    Creatinine, Ser 8.41 (*) 0.50 -  1.10 mg/dL    Calcium 9.9  8.4 - 82.9 mg/dL    Total Protein 7.8  6.0 - 8.3 g/dL    Albumin 4.5  3.5 - 5.2 g/dL    AST 33  0 - 37 U/L    ALT 24  0 - 35 U/L    Alkaline Phosphatase 84  39 - 117 U/L    Total Bilirubin 0.5  0.3 - 1.2 mg/dL    GFR calc non Af Amer 42 (*) >90 mL/min    GFR calc Af Amer 49 (*) >90 mL/min   PROTIME-INR     Status: Normal   Collection Time   05/17/12 10:42 AM      Component Value Range Comment   Prothrombin Time 13.8  11.6 - 15.2 seconds    INR 1.07  0.00 - 1.49   APTT     Status: Normal   Collection Time   05/17/12 10:42 AM      Component Value Range Comment   aPTT 29  24 - 37 seconds   URINALYSIS, ROUTINE W REFLEX MICROSCOPIC     Status: Abnormal   Collection Time   05/17/12  2:24 PM      Component Value Range Comment   Color, Urine YELLOW  YELLOW    APPearance CLEAR  CLEAR    Specific Gravity, Urine 1.008  1.005 - 1.030    pH 6.0  5.0 - 8.0    Glucose, UA NEGATIVE  NEGATIVE mg/dL    Hgb urine dipstick MODERATE (*) NEGATIVE    Bilirubin Urine NEGATIVE  NEGATIVE    Ketones, ur NEGATIVE  NEGATIVE mg/dL    Protein, ur NEGATIVE  NEGATIVE mg/dL    Urobilinogen, UA 0.2  0.0 - 1.0 mg/dL    Nitrite NEGATIVE  NEGATIVE    Leukocytes, UA TRACE (*) NEGATIVE   URINE MICROSCOPIC-ADD ON     Status: Normal   Collection Time   05/17/12  2:24 PM      Component Value Range Comment   WBC, UA 3-6  <3  WBC/hpf    RBC / HPF 0-2  <3 RBC/hpf    Bacteria, UA RARE  RARE     Dg Chest 2 View  05/17/2012  *RADIOLOGY REPORT*  Clinical Data: Fall, hypertension, smoker, dementia  CHEST - 2 VIEW  Comparison: 08/23/2006  Findings: Enlargement of cardiac silhouette post median sternotomy. Calcified tortuous thoracic aorta with question aneurysmal dilatation of the aortic arch. Prominent right superior mediastinal soft tissues unchanged, potentially related to a tortuous innominate artery. Pulmonary vascularity normal. Lungs clear. No pleural effusion or pneumothorax. Diffuse osseous demineralization.  IMPRESSION: Enlargement of cardiac silhouette post median sternotomy. Atherosclerotic changes of the thoracic aorta with question mild aneurysmal dilatation at the aortic arch.   Original Report Authenticated By: Lollie Marrow, M.D.    Dg Wrist 2 Views Left  05/17/2012  *RADIOLOGY REPORT*  Clinical Data: Wrist fracture, post reduction  LEFT WRIST - 2 VIEW  Comparison: 05/17/2012  Findings: Cast material noted about the wrist.  Improved alignment of the left distal radius and ulnar styloid fractures.  Mild soft tissue swelling.  IMPRESSION: Near anatomic alignment following reduction of the left distal radius and ulnar styloid fractures   Original Report Authenticated By: Judie Petit. Ruel Favors, M.D.    Dg Wrist Complete Left  05/17/2012  *RADIOLOGY REPORT*  Clinical Data:  Left wrist pain post fall  LEFT WRIST - COMPLETE 3+ VIEW  Comparison: None  Findings: Diffuse osseous demineralization. Displaced  ulnar styloid fracture. Comminuted displaced intra-articular fracture distal left radial metaphysis with intra-articular extension into the radiocarpal and questionably distal radioulnar joints. Dorsal displacement of the distal radial fragments with apex volar angulation. Associated soft tissue swelling. No definite carpal fractures identified.  IMPRESSION: Displaced ulnar styloid fracture. Displaced angulated comminuted  intra-articular distal left radial metaphyseal fracture.   Original Report Authenticated By: Lollie Marrow, M.D.    Dg Wrist Complete Right  05/17/2012  *RADIOLOGY REPORT*  Clinical Data: Fall, bilateral wrist injuries, struck right frontal region, history dementia, hypertension  RIGHT WRIST - COMPLETE 3+ VIEW  Comparison: None  Findings: Osseous demineralization. Comminuted metaphyseal fracture distal right radius with dorsal displacement of distal radial dorsal fragments. Overlying soft tissue swelling at the distal forearm and carpus. Joint spaces preserved. No additional fracture or dislocation identified.  IMPRESSION: Minimally displaced and comminuted distal right radial metaphyseal fracture. Osseous demineralization.   Original Report Authenticated By: Lollie Marrow, M.D.    Ct Head Wo Contrast  05/17/2012  *RADIOLOGY REPORT*  Clinical Data:  Dementia, fall, found on floor, frontal abrasion, left periorbital swelling  CT HEAD WITHOUT CONTRAST CT CERVICAL SPINE WITHOUT CONTRAST  Technique:  Multidetector CT imaging of the head and cervical spine was performed following the standard protocol without intravenous contrast.  Multiplanar CT image reconstructions of the cervical spine were also generated.  Comparison:  CT head 12/25/2011  CT HEAD  Findings: Generalized atrophy. Stable ventricular morphology. No midline shift or mass effect. Small vessel chronic ischemic changes of deep cerebral white matter. 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 No intracranial hemorrhage, mass lesion or evidence of acute infarction. Small frontal scalp hematoma. Minimal motion artifacts at vertex, for which repeat imaging was performed. Atherosclerotic calcifications at skull base. Partially opacified ethmoid air cells and left frontal sinus with displaced fracture of the medial wall of the left orbit. Inferior aspect of left orbit not imaged. No definite calvarial fracture identified.  IMPRESSION: Atrophy with small vessel  chronic ischemic changes of deep cerebral white matter. 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 No acute intracranial abnormalities. Displaced fracture medial wall left orbit, new.  CT CERVICAL SPINE  Findings: Prevertebral soft tissues normal thickness. Multilevel disc space narrowing and endplate spur formation. Multilevel facet degenerative changes bilaterally greatest at left C3-C4. Vertebral body heights maintained without fracture or subluxation. Visualized skull base intact. Lung apices clear. Scattered atherosclerotic calcifications in the carotid systems bilaterally.  IMPRESSION:  Degenerative disc and facet disease changes of the cervical spine as above. No acute cervical spine abnormalities.   Original Report Authenticated By: Lollie Marrow, M.D.    Ct Cervical Spine Wo Contrast  05/17/2012  *RADIOLOGY REPORT*  Clinical Data:  Dementia, fall, found on floor, frontal abrasion, left periorbital swelling  CT HEAD WITHOUT CONTRAST CT CERVICAL SPINE WITHOUT CONTRAST  Technique:  Multidetector CT imaging of the head and cervical spine was performed following the standard protocol without intravenous contrast.  Multiplanar CT image reconstructions of the cervical spine were also generated.  Comparison:  CT head 12/25/2011  CT HEAD  Findings: Generalized atrophy. Stable ventricular morphology. No midline shift or mass effect. Small vessel chronic ischemic changes of deep cerebral white matter. 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 No intracranial hemorrhage, mass lesion or evidence of acute infarction. Small frontal scalp hematoma. Minimal motion artifacts at vertex, for which repeat imaging was performed. Atherosclerotic calcifications at skull base. Partially opacified ethmoid air cells and left frontal sinus with displaced fracture of the medial wall of the left  orbit. Inferior aspect of left orbit not imaged. No definite calvarial fracture identified.  IMPRESSION: Atrophy with small vessel chronic ischemic  changes of deep cerebral white matter. 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 No acute intracranial abnormalities. Displaced fracture medial wall left orbit, new.  CT CERVICAL SPINE  Findings: Prevertebral soft tissues normal thickness. Multilevel disc space narrowing and endplate spur formation. Multilevel facet degenerative changes bilaterally greatest at left C3-C4. Vertebral body heights maintained without fracture or subluxation. Visualized skull base intact. Lung apices clear. Scattered atherosclerotic calcifications in the carotid systems bilaterally.  IMPRESSION:  Degenerative disc and facet disease changes of the cervical spine as above. No acute cervical spine abnormalities.   Original Report Authenticated By: Lollie Marrow, M.D.      A comprehensive review of systems was negative.  Blood pressure 186/76, pulse 78, temperature 97.4 F (36.3 C), resp. rate 20, SpO2 96.00%.  General appearance: alert, cooperative and appears stated age Head: abrasions to face.  swelling of left eye. Neck: supple, symmetrical, trachea midline Extremities: decreased sensation in fingers on initial exam.  sensation returned to normal after loosening of ace wrap on splints.  brisk capillary refill.  wiggles all digits.  swelling at left wrist.  compartments soft.  no wounds. Pulses: 2+ and symmetric Skin: Skin color, texture, turgor normal. No rashes or lesions Neurologic: Grossly normal Incision/Wound: na  Assessment/Plan Patient seen at approximately noon today.  Bilateral distal radius fractures with displacement on left side.  Discussed nature of injury with patient.  Recommend closed reduction on left.  May benefit from surgical fixation as an outpatient.  Discussed risks and benefits of closed reduction and she wishes to proceed.  Procedure: Hematoma block performed after cleaning skin with alcohol.  10 ml 1% plain lidocaine for block.  Adequate to give anesthesia at fracture site.  Closed reduction  performed and sugartong splint placed.  Intact sensation and capillary refill after reduction and splinting.  Post reduction radiographs confirm improved alignment.  Follow up in office as outpatient.  Mirl Hillery R 05/17/2012, 6:51 PM

## 2012-05-18 ENCOUNTER — Encounter (HOSPITAL_COMMUNITY): Payer: Self-pay | Admitting: General Practice

## 2012-05-18 LAB — BASIC METABOLIC PANEL
CO2: 23 mEq/L (ref 19–32)
Calcium: 9.6 mg/dL (ref 8.4–10.5)
Creatinine, Ser: 1.11 mg/dL — ABNORMAL HIGH (ref 0.50–1.10)
GFR calc non Af Amer: 50 mL/min — ABNORMAL LOW (ref >90)
Sodium: 139 mEq/L (ref 135–145)

## 2012-05-18 LAB — CBC
MCV: 89.3 fL (ref 78.0–100.0)
Platelets: 180 10*3/uL (ref 150–400)
RDW: 12.9 % (ref 11.5–15.5)
WBC: 8.3 10*3/uL (ref 4.0–10.5)

## 2012-05-18 LAB — URINE CULTURE: Culture: NO GROWTH

## 2012-05-18 MED ORDER — HYDRALAZINE HCL 20 MG/ML IJ SOLN
10.0000 mg | Freq: Once | INTRAMUSCULAR | Status: AC
  Administered 2012-05-18: 01:00:00 via INTRAVENOUS
  Filled 2012-05-18: qty 0.5

## 2012-05-18 MED ORDER — HYDRALAZINE HCL 20 MG/ML IJ SOLN
10.0000 mg | Freq: Once | INTRAMUSCULAR | Status: AC
  Administered 2012-05-18: 10 mg via INTRAVENOUS
  Filled 2012-05-18: qty 0.5

## 2012-05-18 NOTE — Clinical Documentation Improvement (Signed)
Hypertension Documentation Clarification Query  THIS DOCUMENT IS NOT A PERMANENT PART OF THE MEDICAL RECORD  TO RESPOND TO THE THIS QUERY, FOLLOW THE INSTRUCTIONS BELOW:  1. If needed, update documentation for the patient's encounter via the notes activity.  2. Access this query again and click edit on the In [employer].  3. After updating, or not, click F2 to complete all highlighted (required) fields concerning your review. Select "additional documentation in the medical record" OR "no additional documentation provided".  4. Click Sign note button.  5. The deficiency will fall out of your In Basket *Please let us know if you are not able to complete this workflow by phone or e-mail (listed below).        05/18/12  Dear Dr. Darrol Jump  Marton Redwood  In an effort to better capture your patient's severity of illness, reflect appropriate length of stay and utilization of resources, a review of the patient medical record has revealed the following indicators.    Based on your clinical judgment, please clarify and document in a progress note and/or discharge summary the clinical condition associated with the following supporting information:  In responding to this query please exercise your independent judgment.  The fact that a query is asked, does not imply that any particular answer is desired or expected.  Patient's BP ranged from systolic 254-167/161-105 (diastolic)  Patient given 2 doses of IV labetalol and 2 doses of IV Hydralazine within 24 hour period.  Would either of the following conditions be appropriate? If so please document in chart.  Thank you  Possible Clinical Conditions?     " Accelerated Hypertension  " Malignant Hypertension  " Or Other Condition  " Cannot Clinically Determine     Risk Factors: s/p mechanical fall bil radius fx's, left orbital wall fx, dementia, and htn   Signs and Symptoms: SBP range: see above  DBP range: see above     IV  Medications: 20 mg Labetalol x 2 and Hydralazine 10 mg  X 2   You may use possible, probable, or suspect with inpatient documentation. Possible, probable, suspected diagnoses MUST be documented at the time of discharge.  Reviewed: no response from MD f/up 10/15....dcd 11/11 coding pending we leave q open in protrack till then   Thank You,  Lavonda Jumbo  Clinical Documentation Specialist RN, BSN Pager [phone]  HIM off # [phone]  Health Information Management [place]

## 2012-05-18 NOTE — Progress Notes (Signed)
Clinical Social Work Department BRIEF PSYCHOSOCIAL ASSESSMENT 05/18/2012  Patient:  Amanda [name], Amanda [name]     Account Number:  [id number]     Admit date:  05/17/2012  Clinical Social Worker:  Dennison Bulla  Date/Time:  05/18/2012 03:30 PM  Referred by:  Physician  Date Referred:  05/18/2012 Referred for  SNF Placement   Other Referral:   Interview type:  Patient Other interview type:   Family    PSYCHOSOCIAL DATA Living Status:  FAMILY Admitted from facility:   Level of care:   Primary support name:  Amanda [name] Primary support relationship to patient:  SPOUSE Degree of support available:   Strong    CURRENT CONCERNS Current Concerns  Post-Acute Placement   Other Concerns:    SOCIAL WORK ASSESSMENT / PLAN CSW received referral to assist with SNF placement. CSW reviewed chart and met with patient and family at bedside. Patient was confused and struggled with participating in assessment.    CSW introduced myself and explained role. After patient began answering questions inappropriately, CSW stepped outside of room and met with 2 sons Amanda [name] and Amanda [name]). Sons report that patient lives at home with husband. Sons aware of PT/OT recommendations for SNF and are agreeable to plan. Sons report patient will not be agreeable to plan. Sons report that husband will make ultimate decision regarding which facility is chosen but gave CSW permission to fax out patient. CSW provided sons with SNF list and explained process. CSW explained Medicare benefits regarding SNF placement.    CSW submitted for [facility]arr, completed FL2 and faxed out to [facility]. CSW will follow up with bed offers. CSW placed FL2 and dementia note on chart for MD signature.   Assessment/plan status:  Psychosocial Support/Ongoing Assessment of Needs Other assessment/ plan:   Information/referral to community resources:   SNF list    PATIENT'S/FAMILY'S RESPONSE TO PLAN OF CARE: Patient alert but not oriented.  Patient's sons were engaged throughout assessment and asked appropriate questions. Sons feel [facility] SNF placement will be best for patient.        Coverage for Lovette Cliche

## 2012-05-18 NOTE — Progress Notes (Signed)
Occupational Therapy Evaluation Patient Details Name: Amanda [name] MRN: [id number] DOB: 01/19/1946 Today's Date: 05/18/2012 Time: 4098-1191 OT Time Calculation (min): 21 min  OT Assessment / Plan / Recommendation Clinical Impression  Pt. 66 yo female s/p bilateral distal radius fx. Pt. is low cognitive level at baseline due to dementia. Pt. requires +2 total (A) for safety during transfers and bed mobility. Pt. was very lethargic thoughout session, falling asleep every 4-5 minutes. Would benfit from OT acutely to address mobility and increase indepndence with ADL's.      OT Assessment  Patient needs continued OT Services    Follow Up Recommendations  Skilled nursing facility    Barriers to Discharge      Equipment Recommendations  None recommended by PT    Recommendations for Other Services    Frequency  Min 2X/week    Precautions / Restrictions Precautions Precautions: Fall   Pertinent Vitals/Pain None reported   ADL  Toilet Transfer: Simulated;+2 Total assistance Toilet Transfer: Patient Percentage: 80% Toilet Transfer Method: Sit to stand Toilet Transfer Equipment: Raised toilet seat with arms (or 3-in-1 over toilet) Equipment Used: Gait belt Transfers/Ambulation Related to ADLs: Pt. very lethargic, required +2 total (A) and max verbal cues to stay sitting up. Pt. stand-pivot to transfer to chair. Ambulation not assessed.  ADL Comments: Pt. required total (A) to brush teeth with set up in the chair. When prompted to brush teeth pt brought hand to mouth. When presented with toothbrush pt. unsure of what to do with it and would not grasp it. Therapist brushed pt. teeth. Pt. is low cognitive level due to dementia. Husband states he was helping her with a lot of her daily routine.     OT Diagnosis: Cognitive deficits;Generalized weakness  OT Problem List: Decreased activity tolerance;Decreased cognition;Decreased safety awareness;Decreased knowledge of  precautions;Impaired UE functional use OT Treatment Interventions: Therapeutic activities;Patient/family education;Therapeutic exercise;Self-care/ADL training   OT Goals Acute Rehab OT Goals OT Goal Formulation: With family ADL Goals Pt Will Perform Grooming: Sitting, chair;with supervision ADL Goal: Grooming - Progress: Goal set today Pt Will Perform Upper Body Dressing: with min assist;Sitting, chair ADL Goal: Upper Body Dressing - Progress: Goal set today Pt Will Perform Lower Body Dressing: with min assist;Sit to stand from chair ADL Goal: Lower Body Dressing - Progress: Goal set today Pt Will Transfer to Toilet: with min assist;3-in-1 ADL Goal: Toilet Transfer - Progress: Goal set today Pt Will Perform Toileting - Clothing Manipulation: with min assist;Sitting on 3-in-1 or toilet ADL Goal: Toileting - Clothing Manipulation - Progress: Goal set today Pt Will Perform Toileting - Hygiene: with min assist;Sit to stand from 3-in-1/toilet ADL Goal: Toileting - Hygiene - Progress: Goal set today  Visit Information  Last OT Received On: 05/18/12 Assistance Needed: +2 PT/OT Co-Evaluation/Treatment: Yes    Subjective Data  Subjective: [facility]  Patient Stated Goal: None stated    Prior Functioning     Home Living Lives With: Spouse Available Help at Discharge: Family Type of Home: House Home Access: Stairs to enter Secretary/administrator of Steps: 2-3 Entrance Stairs-Rails: Left Home Layout: Two level Alternate Level Stairs-Number of Steps: Split level 7 going up and 7 going down Alternate Level Stairs-Rails: Left Bathroom Shower/Tub: Tub/shower unit;Curtain Bathroom Toilet: Standard Bathroom Accessibility: Yes How Accessible: Accessible via walker Home Adaptive Equipment: None Prior Function Level of Independence: Needs assistance Needs Assistance: Meal Prep Meal Prep: Total Driving: No Vocation: Retired Musician: Expressive  difficulties Dominant Hand: Right  Vision/Perception     Cognition  Overall Cognitive Status: History of cognitive impairments - at baseline Arousal/Alertness: Lethargic Orientation Level: Disoriented to;Place;Time;Situation Behavior During Session: Lethargic    Extremity/Trunk Assessment Right Upper Extremity Assessment RUE ROM/Strength/Tone: Unable to fully assess Left Upper Extremity Assessment LUE ROM/Strength/Tone: Unable to fully assess     Mobility Bed Mobility Bed Mobility: Supine to Sit;Sitting - Scoot to Edge of Bed Supine to Sit: 1: +2 Total assist;HOB elevated Supine to Sit: Patient Percentage: 70% Sitting - Scoot to Edge of Bed: 1: +2 Total assist Sitting - Scoot to Edge of Bed: Patient Percentage: 60% Details for Bed Mobility Assistance: (A) supoorting upper body (posterior lean) and shifting hips OOB.  Max cues for proper technique, foot placement, and safety. Transfers Transfers: Sit to Stand;Stand to Sit Sit to Stand: 1: +2 Total assist;From bed Sit to Stand: Patient Percentage: 60% Stand to Sit: 1: +2 Total assist;To chair/3-in-1 Stand to Sit: Patient Percentage: 70% Details for Transfer Assistance: (A) with balance and initiating mvt.  Knee block to prevent buckling.  Max cues for proper technique, stepping pattern, and safety     Shoulder Instructions     Exercise     Balance Balance Balance Assessed: No   End of Session OT - End of Session Equipment Utilized During Treatment: Gait belt Activity Tolerance: Patient limited by fatigue Patient left: in chair;with call bell/phone within reach;with family/visitor present Nurse Communication: Mobility status  GO     Cleora Fleet 05/18/2012, 11:10 AM

## 2012-05-18 NOTE — Progress Notes (Signed)
I agree with the following treatment note after reviewing documentation.   Johnston, Jesper Stirewalt Brynn   OTR/L Pager: [phone] Office: [phone] .   

## 2012-05-18 NOTE — Progress Notes (Signed)
05/18/12 1054  PT Visit Information  Last PT Received On 05/18/12  Assistance Needed +2  PT/OT Co-Evaluation/Treatment Yes  PT Time Calculation  PT Start Time 1020  PT Stop Time 1041  PT Time Calculation (min) 21 min  Subjective Data  Subjective I am in jail.  Patient Stated Goal To get better  Precautions  Precautions Fall  Home Living  Lives With Spouse  Available Help at Discharge Family  Type of Home House  Home Access Stairs to enter  Entrance Stairs-Number of Steps 2-3  Entrance Stairs-Rails Left  Home Layout Two level  Alternate Level Stairs-Number of Steps Split level 7 going up and 7 going down  Alternate Level Stairs-Rails Left  Bathroom Shower/Tub Tub/shower unit;Curtain  Horticulturist, commercial Yes  How Accessible Accessible via walker  Home Adaptive Equipment None  Prior Function  Level of Independence Needs assistance  Needs Assistance Meal Prep  Meal Prep Total  Driving No  Vocation Retired  Geneticist, molecular Expressive difficulties  Cognition  Overall Cognitive Status History of cognitive impairments - at baseline  Arousal/Alertness Lethargic  Orientation Level Disoriented to;Place;Time;Situation  Behavior During Session Lethargic  Right Upper Extremity Assessment  RUE ROM/Strength/Tone Unable to fully assess  Left Upper Extremity Assessment  LUE ROM/Strength/Tone Unable to fully assess  Bed Mobility  Bed Mobility Supine to Sit;Sitting - Scoot to Edge of Bed  Supine to Sit 1: +2 Total assist;HOB elevated  Supine to Sit: Patient Percentage 70%  Sitting - Scoot to Edge of Bed 1: +2 Total assist  Sitting - Scoot to Edge of Bed: Patient Percentage 60%  Details for Bed Mobility Assistance (A) supoorting upper body (posterior lean) and shifting hips OOB.  Max cues for proper technique, foot placement, and safety.  Transfers  Transfers Sit to Stand;Stand to Sit;Stand Pivot Transfers  Sit to Stand 1: +2 Total  assist;From bed  Sit to Stand: Patient Percentage 60%  Stand to Sit 1: +2 Total assist;To chair/3-in-1  Stand to Sit: Patient Percentage 70%  Stand Pivot Transfers 1: +2 Total assist  Stand Pivot Transfers: Patient Percentage 80%  Details for Transfer Assistance (A) with balance and initiating mvt.  Knee block to prevent buckling.  Max cues for proper technique, stepping pattern, and safety  Ambulation/Gait  Ambulation/Gait Assistance Not tested (comment)  Wheelchair Mobility  Wheelchair Mobility No  Balance  Balance Assessed No  PT - End of Session  Equipment Utilized During Treatment Gait belt  Activity Tolerance Patient limited by fatigue;Treatment limited secondary to medical complications (Comment) (Cognition)  Patient left in chair;with call bell/phone within reach;with family/visitor present  Nurse Communication Mobility status  PT Assessment  Clinical Impression Statement Pt is 66 y.o. F s/p bilateral radial fx.  Pt unable to give history and had difficult time performing transfers and bed mobility without maximal cueing.  Pt will benefit from acute physical therapy services to increase strength, balance, and endurance.  PT Recommendation/Assessment Patient needs continued PT services  PT Problem List Decreased strength;Decreased range of motion;Decreased activity tolerance;Decreased mobility;Decreased balance;Decreased cognition;Decreased knowledge of use of DME;Decreased safety awareness;Decreased knowledge of precautions;Pain  Barriers to Discharge None  PT Therapy Diagnosis  Difficulty walking;Abnormality of gait;Generalized weakness;Acute pain  PT Plan  PT Frequency Min 3X/week  PT Treatment/Interventions DME instruction;Gait training;Stair training;Functional mobility training;Therapeutic activities;Therapeutic exercise;Balance training;Neuromuscular re-education;Cognitive remediation;Patient/family education  PT Recommendation  Recommendations for Other Services Other  (comment) (None)  Follow Up Recommendations Post acute inpatient;Supervision/Assistance - 24 hour  Does the  patient have the potential to tolerate intense rehabilitation? No, Recommend SNF  Equipment Recommended None recommended by PT  Individuals Consulted  Consulted and Agree with Results and Recommendations Patient;Family member/caregiver  Family Member Consulted husband and son  Acute Rehab PT Goals  PT Goal Formulation With patient  Time For Goal Achievement 05/25/12  Potential to Achieve Goals Fair  Pt will go Supine/Side to Sit with min assist;with HOB 0 degrees;with cues (comment type and amount)  PT Goal: Supine/Side to Sit - Progress Goal set today  Pt will go Sit to Supine/Side with min assist;with HOB 0 degrees;with cues (comment type and amount)  PT Goal: Sit to Supine/Side - Progress Goal set today  Pt will go Sit to Stand with min assist;with cues (comment type and amount);without upper extremity assist  PT Goal: Sit to Stand - Progress Goal set today  Pt will go Stand to Sit with min assist;without upper extremity assist;with cues (comment type and amount)  PT Goal: Stand to Sit - Progress Goal set today  Pt will Transfer Bed to Chair/Chair to Bed with min assist;with cues (comment type and amount)  PT Transfer Goal: Bed to Chair/Chair to Bed - Progress Goal set today  Pt will Ambulate 51 - 150 feet;with min assist  PT Goal: Ambulate - Progress Goal set today  Pt will Go Up / Down Stairs 6-9 stairs;with min assist;with cues (comment type and amount)  PT Goal: Up/Down Stairs - Progress Goal set today  Written Expression  Dominant Hand Right    Unable to rate pain.  Amy DiTommaso, SPT  [place], [place]'Brien DPT [phone]

## 2012-05-18 NOTE — Progress Notes (Addendum)
Clinical Social Work Department CLINICAL SOCIAL WORK PLACEMENT NOTE 05/18/2012  Patient:  Amanda [name], Amanda [name]  Account Number:  [id number] Admit date:  05/17/2012  Clinical Social Worker:  Unk Lightning, LCSW  Date/time:  05/18/2012 03:30 PM  Clinical Social Work is seeking post-discharge placement for this patient at the following level of care:   SKILLED NURSING   (*CSW will update this form in Epic as items are completed)   05/18/2012  Patient/family provided with [facility] Department of Clinical Social Work's list of facilities offering this level of care within the geographic area requested by the patient (or if unable, by the patient's family).  05/18/2012  Patient/family informed of their freedom to choose among providers that offer the needed level of care, that participate in Medicare, Medicaid or managed care program needed by the patient, have an available bed and are willing to accept the patient.  05/18/2012  Patient/family informed of [facility]' ownership interest in [facility], as well as of the fact that they are under no obligation to receive care at this facility.  PASARR submitted to EDS on 05/18/2012 PASARR number received from EDS on 05/19/12   FL2 transmitted to all facilities in geographic area requested by pt/family on  05/18/2012 FL2 transmitted to all facilities within larger geographic area on   Patient informed that his/her managed care company has contracts with or will negotiate with  certain facilities, including the following:     Patient/family informed of bed offers received:  05/19/12 Patient chooses bed at [facility] Physician recommends and patient chooses bed at  NA  Patient to be transferred to [facility] on  05/21/12 Patient to be transferred to facility by Ambulance [facility])  The following physician request were entered in Epic:   Additional Comments: Husband agrees to short term SNF. Patient remains alert but disoriented.  Lorri Frederick. [place] Pugh  [phone]

## 2012-05-18 NOTE — Progress Notes (Signed)
Nutrition Brief Note  Patient determined to be at nutrition risk due to low braden score.   Body mass index is 29.87 kg/(m^2). Pt meets criteria for overweight based on current BMI.   Current diet order is Heart Healthy, patient is consuming approximately 50% of meals at this time per son's report. Labs and medications reviewed.   Son reports pt is eating per her usual and is tolerating current diet well.  He denies changes to appetite, intake, or weight PTA.  RD stated availability of clinical nutrition staff and supplements should change in intake occur.  No nutrition interventions warranted at this time. If nutrition issues arise, please consult RD.   Loyce Dys, MS RD LDN Clinical Inpatient Dietitian Pager: [phone] Weekend/After hours pager: [phone]

## 2012-05-18 NOTE — Progress Notes (Signed)
Subjective: Admission H&P reviewed, recommendation from ophthalmology noted in the admitting M.D.'s note, orthopedic close reduction left wrist fracture noted, recommendations for outpatient followup also noted. Apparently patient had a mechanical fall, she has baseline dementia and poor safety awareness. No signs of infection or any other acute event. No new problems overnight. I did discuss with patient's husband, she will need to be in a skilled nursing facility as he cannot manage her at home in her current predicament  Objective: Vital signs in last 24 hours: Temp:  [97.2 F (36.2 C)-98 F (36.7 C)] 98 F (36.7 C) (10/09 0554) Pulse Rate:  [59-106] 104  (10/09 0554) Resp:  [10-20] 18  (10/09 0554) BP: (164-254)/(76-161) 181/121 mmHg (10/09 0554) SpO2:  [96 %-100 %] 100 % (10/09 0554) Weight:  [71.7 kg (158 lb 1.1 oz)] 71.7 kg (158 lb 1.1 oz) (10/09 0554) Weight change:  Last BM Date:  (prior to admission)  Intake/Output from previous day: 10/08 0701 - 10/09 0700 In: 712.5 [I.V.:712.5] Out: -  Intake/Output this shift:    General appearance: patient confused Head: left facial forehead and pale periorbita bruising. Extraocular movement intact Resp: clear to auscultation bilaterally Cardio: regular rate and rhythm, S1, S2 normal, no murmur, click, rub or gallop GI: soft, non-tender; bowel sounds normal; no masses,  no organomegaly and  Extremities: bilateral wrist splints  Lab Results:  Results for orders placed during the hospital encounter of 05/17/12 (from the past 24 hour(s))  CBC WITH DIFFERENTIAL     Status: Abnormal   Collection Time   05/17/12 10:42 AM      Component Value Range   WBC 12.0 (*) 4.0 - 10.5 K/uL   RBC 4.21  3.87 - 5.11 MIL/uL   Hemoglobin 12.7  12.0 - 15.0 g/dL   HCT 78.2  95.6 - 21.3 %   MCV 91.0  78.0 - 100.0 fL   MCH 30.2  26.0 - 34.0 pg   MCHC 33.2  30.0 - 36.0 g/dL   RDW 08.6  57.8 - 46.9 %   Platelets 160  150 - 400 K/uL   Neutrophils  Relative 84 (*) 43 - 77 %   Neutro Abs 10.1 (*) 1.7 - 7.7 K/uL   Lymphocytes Relative 11 (*) 12 - 46 %   Lymphs Abs 1.3  0.7 - 4.0 K/uL   Monocytes Relative 4  3 - 12 %   Monocytes Absolute 0.5  0.1 - 1.0 K/uL   Eosinophils Relative 0  0 - 5 %   Eosinophils Absolute 0.1  0.0 - 0.7 K/uL   Basophils Relative 0  0 - 1 %   Basophils Absolute 0.0  0.0 - 0.1 K/uL  COMPREHENSIVE METABOLIC PANEL     Status: Abnormal   Collection Time   05/17/12 10:42 AM      Component Value Range   Sodium 138  135 - 145 mEq/L   Potassium 3.6  3.5 - 5.1 mEq/L   Chloride 104  96 - 112 mEq/L   CO2 20  19 - 32 mEq/L   Glucose, Bld 104 (*) 70 - 99 mg/dL   BUN 26 (*) 6 - 23 mg/dL   Creatinine, Ser 6.29 (*) 0.50 - 1.10 mg/dL   Calcium 9.9  8.4 - 52.8 mg/dL   Total Protein 7.8  6.0 - 8.3 g/dL   Albumin 4.5  3.5 - 5.2 g/dL   AST 33  0 - 37 U/L   ALT 24  0 - 35 U/L  Alkaline Phosphatase 84  39 - 117 U/L   Total Bilirubin 0.5  0.3 - 1.2 mg/dL   GFR calc non Af Amer 42 (*) >90 mL/min   GFR calc Af Amer 49 (*) >90 mL/min  PROTIME-INR     Status: Normal   Collection Time   05/17/12 10:42 AM      Component Value Range   Prothrombin Time 13.8  11.6 - 15.2 seconds   INR 1.07  0.00 - 1.49  APTT     Status: Normal   Collection Time   05/17/12 10:42 AM      Component Value Range   aPTT 29  24 - 37 seconds  URINALYSIS, ROUTINE W REFLEX MICROSCOPIC     Status: Abnormal   Collection Time   05/17/12  2:24 PM      Component Value Range   Color, Urine YELLOW  YELLOW   APPearance CLEAR  CLEAR   Specific Gravity, Urine 1.008  1.005 - 1.030   pH 6.0  5.0 - 8.0   Glucose, UA NEGATIVE  NEGATIVE mg/dL   Hgb urine dipstick MODERATE (*) NEGATIVE   Bilirubin Urine NEGATIVE  NEGATIVE   Ketones, ur NEGATIVE  NEGATIVE mg/dL   Protein, ur NEGATIVE  NEGATIVE mg/dL   Urobilinogen, UA 0.2  0.0 - 1.0 mg/dL   Nitrite NEGATIVE  NEGATIVE   Leukocytes, UA TRACE (*) NEGATIVE  URINE MICROSCOPIC-ADD ON     Status: Normal   Collection  Time   05/17/12  2:24 PM      Component Value Range   WBC, UA 3-6  <3 WBC/hpf   RBC / HPF 0-2  <3 RBC/hpf   Bacteria, UA RARE  RARE      Studies/Results: Dg Chest 2 View  05/17/2012  *RADIOLOGY REPORT*  Clinical Data: Fall, hypertension, smoker, dementia  CHEST - 2 VIEW  Comparison: 08/23/2006  Findings: Enlargement of cardiac silhouette post median sternotomy. Calcified tortuous thoracic aorta with question aneurysmal dilatation of the aortic arch. Prominent right superior mediastinal soft tissues unchanged, potentially related to a tortuous innominate artery. Pulmonary vascularity normal. Lungs clear. No pleural effusion or pneumothorax. Diffuse osseous demineralization.  IMPRESSION: Enlargement of cardiac silhouette post median sternotomy. Atherosclerotic changes of the thoracic aorta with question mild aneurysmal dilatation at the aortic arch.   Original Report Authenticated By: Lollie Marrow, M.D.    Dg Wrist 2 Views Left  05/17/2012  *RADIOLOGY REPORT*  Clinical Data: Wrist fracture, post reduction  LEFT WRIST - 2 VIEW  Comparison: 05/17/2012  Findings: Cast material noted about the wrist.  Improved alignment of the left distal radius and ulnar styloid fractures.  Mild soft tissue swelling.  IMPRESSION: Near anatomic alignment following reduction of the left distal radius and ulnar styloid fractures   Original Report Authenticated By: Judie Petit. Ruel Favors, M.D.    Dg Wrist Complete Left  05/17/2012  *RADIOLOGY REPORT*  Clinical Data:  Left wrist pain post fall  LEFT WRIST - COMPLETE 3+ VIEW  Comparison: None  Findings: Diffuse osseous demineralization. Displaced ulnar styloid fracture. Comminuted displaced intra-articular fracture distal left radial metaphysis with intra-articular extension into the radiocarpal and questionably distal radioulnar joints. Dorsal displacement of the distal radial fragments with apex volar angulation. Associated soft tissue swelling. No definite carpal fractures  identified.  IMPRESSION: Displaced ulnar styloid fracture. Displaced angulated comminuted intra-articular distal left radial metaphyseal fracture.   Original Report Authenticated By: Lollie Marrow, M.D.    Dg Wrist Complete Right  05/17/2012  *RADIOLOGY  REPORT*  Clinical Data: Fall, bilateral wrist injuries, struck right frontal region, history dementia, hypertension  RIGHT WRIST - COMPLETE 3+ VIEW  Comparison: None  Findings: Osseous demineralization. Comminuted metaphyseal fracture distal right radius with dorsal displacement of distal radial dorsal fragments. Overlying soft tissue swelling at the distal forearm and carpus. Joint spaces preserved. No additional fracture or dislocation identified.  IMPRESSION: Minimally displaced and comminuted distal right radial metaphyseal fracture. Osseous demineralization.   Original Report Authenticated By: Lollie Marrow, M.D.    Ct Head Wo Contrast  05/17/2012  *RADIOLOGY REPORT*  Clinical Data:  Dementia, fall, found on floor, frontal abrasion, left periorbital swelling  CT HEAD WITHOUT CONTRAST CT CERVICAL SPINE WITHOUT CONTRAST  Technique:  Multidetector CT imaging of the head and cervical spine was performed following the standard protocol without intravenous contrast.  Multiplanar CT image reconstructions of the cervical spine were also generated.  Comparison:  CT head 12/25/2011  CT HEAD  Findings: Generalized atrophy. Stable ventricular morphology. No midline shift or mass effect. Small vessel chronic ischemic changes of deep cerebral white matter. 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 No intracranial hemorrhage, mass lesion or evidence of acute infarction. Small frontal scalp hematoma. Minimal motion artifacts at vertex, for which repeat imaging was performed. Atherosclerotic calcifications at skull base. Partially opacified ethmoid air cells and left frontal sinus with displaced fracture of the medial wall of the left orbit. Inferior aspect of left orbit not  imaged. No definite calvarial fracture identified.  IMPRESSION: Atrophy with small vessel chronic ischemic changes of deep cerebral white matter. 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 No acute intracranial abnormalities. Displaced fracture medial wall left orbit, new.  CT CERVICAL SPINE  Findings: Prevertebral soft tissues normal thickness. Multilevel disc space narrowing and endplate spur formation. Multilevel facet degenerative changes bilaterally greatest at left C3-C4. Vertebral body heights maintained without fracture or subluxation. Visualized skull base intact. Lung apices clear. Scattered atherosclerotic calcifications in the carotid systems bilaterally.  IMPRESSION:  Degenerative disc and facet disease changes of the cervical spine as above. No acute cervical spine abnormalities.   Original Report Authenticated By: Lollie Marrow, M.D.    Ct Cervical Spine Wo Contrast  05/17/2012  *RADIOLOGY REPORT*  Clinical Data:  Dementia, fall, found on floor, frontal abrasion, left periorbital swelling  CT HEAD WITHOUT CONTRAST CT CERVICAL SPINE WITHOUT CONTRAST  Technique:  Multidetector CT imaging of the head and cervical spine was performed following the standard protocol without intravenous contrast.  Multiplanar CT image reconstructions of the cervical spine were also generated.  Comparison:  CT head 12/25/2011  CT HEAD  Findings: Generalized atrophy. Stable ventricular morphology. No midline shift or mass effect. Small vessel chronic ischemic changes of deep cerebral white matter. 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 No intracranial hemorrhage, mass lesion or evidence of acute infarction. Small frontal scalp hematoma. Minimal motion artifacts at vertex, for which repeat imaging was performed. Atherosclerotic calcifications at skull base. Partially opacified ethmoid air cells and left frontal sinus with displaced fracture of the medial wall of the left orbit. Inferior aspect of left orbit not imaged. No definite  calvarial fracture identified.  IMPRESSION: Atrophy with small vessel chronic ischemic changes of deep cerebral white matter. 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 No acute intracranial abnormalities. Displaced fracture medial wall left orbit, new.  CT CERVICAL SPINE  Findings: Prevertebral soft tissues normal thickness. Multilevel disc space narrowing and endplate spur formation. Multilevel facet degenerative changes bilaterally greatest at left C3-C4. Vertebral body heights maintained without fracture or  subluxation. Visualized skull base intact. Lung apices clear. Scattered atherosclerotic calcifications in the carotid systems bilaterally.  IMPRESSION:  Degenerative disc and facet disease changes of the cervical spine as above. No acute cervical spine abnormalities.   Original Report Authenticated By: Lollie Marrow, M.D.     Medications:  Prior to Admission:  Prescriptions prior to admission  Medication Sig Dispense Refill  . atorvastatin (LIPITOR) 10 MG tablet Take 10 mg by mouth daily.      . cloNIDine (CATAPRES) 0.1 MG tablet Take 0.1 mg by mouth 2 (two) times daily.      Marland Kitchen donepezil (ARICEPT) 10 MG tablet Take 10 mg by mouth at bedtime.      Marland Kitchen labetalol (NORMODYNE) 300 MG tablet Take 300 mg by mouth 2 (two) times daily.      . Memantine HCl ER (NAMENDA XR) 28 MG CP24 Take 28 mg by mouth daily.       Scheduled:   . atorvastatin  10 mg Oral Daily  . cloNIDine  0.1 mg Oral BID  . donepezil  10 mg Oral QHS  . hydrALAZINE  10 mg Intravenous Once  . hydrALAZINE  10 mg Intravenous Once  . labetalol  300 mg Oral BID  . lidocaine  10 mL Other Once  . memantine  10 mg Oral BID  .  morphine injection  4 mg Intravenous Once  . ondansetron (ZOFRAN) IV  4 mg Intravenous Once  . ziprasidone  10 mg Intramuscular Once  . ziprasidone  10 mg Intramuscular Once  . DISCONTD: sodium chloride   Intravenous Once  . DISCONTD: labetalol  20 mg Intravenous Once  . DISCONTD: labetalol  20 mg Intravenous Once    . DISCONTD: Memantine HCl ER  28 mg Oral Daily   Continuous:   . sodium chloride 75 mL/hr at 05/18/12 0500  . DISCONTD: sodium chloride      Assessment/Plan: Bilateral wrist fractures  Right wrist x-ray on 05/17/2012 shows a minimally displaced and comminuted distal right radial metaphyseal fracture. Left wrist x-ray on 05/17/2012 showed displaced ulnar styloid fracture and displaced angulated comminuted intra-articular distal left radial metaphyseal fracture. Dr. Merlyn Lot, hand ortho, evaluated the patient and reduce the fractures and splinted the wrist. Further workup as per Dr. Merlyn Lot.   Fall  Appears to be mechanical. No further workup.  Displaced fracture medial wall left orbit  Note reviewed from admitting M.D. Conversation with ophthalmology, Dr. Clarisa Kindred, ophthalmology, recommended conservative management. She indicated that the fracture will heal on its own. Recommended having the patient followup with her ophthalmologist after discharge.   hypertension    Known chronic kidney disease  Leukocytosis  Likely reactive from the fall and fractures. Chest x-ray and urinalysis not suggestive of infectious etiology.  Question mild aneurysmal dilatation of aortic arch on chest x-ray  Further workup as per her primary care physician as outpatient. No plans for further evaluation Left forehead bruise from the fall  Conservative management.  Dementia.   Dorlisa Savino D 05/18/2012, 8:59 AM

## 2012-05-19 NOTE — Progress Notes (Signed)
Physical Therapy Treatment Patient Details Name: Amanda [name] MRN: [id number] DOB: [birth year] Today's Date: 05/19/2012 Time: 4098-1191 PT Time Calculation (min): 18 min  PT Assessment / Plan / Recommendation Comments on Treatment Session  Pt able to perform ambulation.  Pt very steady on feet and began to have an anterior lean.  Pt required max cues for all tansfers and ambulation. Goals updated.    Follow Up Recommendations  Post acute inpatient;Supervision/Assistance - 24 hour     Does the patient have the potential to tolerate intense rehabilitation  No, Recommend SNF  Barriers to Discharge        Equipment Recommendations  None recommended by PT    Recommendations for Other Services Other (comment) (None)  Frequency Min 3X/week   Plan Discharge plan remains appropriate;Frequency remains appropriate    Precautions / Restrictions Precautions Precautions: Fall   Pertinent Vitals/Pain Unable to rate or state pain.    Mobility  Bed Mobility Bed Mobility: Supine to Sit;Sitting - Scoot to Edge of Bed Supine to Sit: 1: +2 Total assist;HOB flat Supine to Sit: Patient Percentage: 70% Sitting - Scoot to Edge of Bed: 1: +2 Total assist Sitting - Scoot to Edge of Bed: Patient Percentage: 50% Details for Bed Mobility Assistance: (A) supporting upper body due to posterior lean.  Max cues for limb placement, proper technique, and safety. Transfers Transfers: Sit to Stand;Stand to Sit Sit to Stand: 1: +2 Total assist;From bed Sit to Stand: Patient Percentage: 70% Stand to Sit: 1: +2 Total assist;To chair/3-in-1 Stand to Sit: Patient Percentage: 90% Details for Transfer Assistance: (A) initiating mvt and balance.  Max cues for proper technique and safety Ambulation/Gait Ambulation/Gait Assistance: 1: +2 Total assist Ambulation/Gait: Patient Percentage: 70% Ambulation Distance (Feet): 10 Feet Assistive device: 2 person hand held assist Ambulation/Gait Assistance Details:  (A) with balance (very unsteady) and needed tactile cues for direction.  VC for posture and safety. Gait Pattern: Shuffle;Trunk flexed;Narrow base of support (ambulating sideways) Gait velocity: decreased Stairs: No Wheelchair Mobility Wheelchair Mobility: No    Exercises General Exercises - Lower Extremity Long Arc Quad: AROM;Both;10 reps Hip ABduction/ADduction: AROM;Both;10 reps Hip Flexion/Marching: AROM;Both;10 reps Heel Raises: AROM;Both;10 reps   PT Diagnosis:    PT Problem List:   PT Treatment Interventions:     PT Goals Acute Rehab PT Goals PT Goal Formulation: With patient Time For Goal Achievement: 05/25/12 Potential to Achieve Goals: Fair Pt will go Supine/Side to Sit: with min assist;with HOB 0 degrees;with cues (comment type and amount) PT Goal: Supine/Side to Sit - Progress: Progressing toward goal Pt will go Sit to Stand: with min assist;with cues (comment type and amount);without upper extremity assist PT Goal: Sit to Stand - Progress: Progressing toward goal Pt will go Stand to Sit: with min assist;without upper extremity assist;with cues (comment type and amount) PT Goal: Stand to Sit - Progress: Progressing toward goal Pt will Ambulate: 51 - 150 feet;with min assist PT Goal: Ambulate - Progress: Progressing toward goal Pt will Perform Home Exercise Program: with min assist PT Goal: Perform Home Exercise Program - Progress: Goal set today  Visit Information  Last PT Received On: 05/19/12 Assistance Needed: +2    Subjective Data  Subjective: Did I have a stroke? Patient Stated Goal: To get better   Cognition  Overall Cognitive Status: History of cognitive impairments - at baseline Arousal/Alertness: Lethargic Orientation Level: Disoriented to;Place;Time;Situation Behavior During Session: Lethargic    Balance  Balance Balance Assessed: No  End of Session PT -  End of Session Equipment Utilized During Treatment: Gait belt Activity Tolerance: Patient  limited by fatigue;Treatment limited secondary to medical complications (Comment) Patient left: in chair;with call bell/phone within reach Nurse Communication: Mobility status   GP     Carlon Davidson 05/19/2012, 11:56 AM

## 2012-05-19 NOTE — Progress Notes (Signed)
Clinical Social Work  CSW met with patient, husband and son at bedside. CSW explained SNF process to husband and answered questions since he was not present yesterday. CSW provided family with bed offers and answered further questions regarding SNF process. Family desires to talk about options before making a final decision. CSW agreed to follow up tomorrow to determine which bed is chosen.  [place], [place] [phone] (Coverage for Lovette Cliche)

## 2012-05-19 NOTE — Progress Notes (Signed)
Subjective: Patient resting, somewhat sedated as medication was needed for some difficult to manage behavior last night  Objective: Vital signs in last 24 hours: Temp:  [98.3 F (36.8 C)-98.5 F (36.9 C)] 98.3 F (36.8 C) (10/10 0535) Pulse Rate:  [73-84] 73  (10/10 0635) Resp:  [16] 16  (10/10 0535) BP: (168-184)/(76-80) 168/80 mmHg (10/10 0635) SpO2:  [99 %-100 %] 99 % (10/10 0535) Weight:  [71.2 kg (156 lb 15.5 oz)] 71.2 kg (156 lb 15.5 oz) (10/10 0700) Weight change: -0.5 kg (-1 lb 1.6 oz) Last BM Date: 05/17/12  Intake/Output from previous day:   Intake/Output this shift:    General appearance:  resting however easily aroused Resp: clear to auscultation bilaterally Cardio: regular rate and rhythm, S1, S2 normal, no murmur, click, rub or gallop Extremities: bilateral wrists wrapped  Lab Results:  No results found for this or any previous visit (from the past 24 hour(s)).    Studies/Results: No results found.  Medications:  Prior to Admission:  Prescriptions prior to admission  Medication Sig Dispense Refill  . atorvastatin (LIPITOR) 10 MG tablet Take 10 mg by mouth daily.      . cloNIDine (CATAPRES) 0.1 MG tablet Take 0.1 mg by mouth 2 (two) times daily.      Marland Kitchen donepezil (ARICEPT) 10 MG tablet Take 10 mg by mouth at bedtime.      Marland Kitchen labetalol (NORMODYNE) 300 MG tablet Take 300 mg by mouth 2 (two) times daily.      . Memantine HCl ER (NAMENDA XR) 28 MG CP24 Take 28 mg by mouth daily.       Scheduled:   . atorvastatin  10 mg Oral Daily  . cloNIDine  0.1 mg Oral BID  . donepezil  10 mg Oral QHS  . labetalol  300 mg Oral BID  . memantine  10 mg Oral BID   Continuous:   . sodium chloride 50 mL/hr at 05/18/12 1610   RUE:[id number], acetaminophen, hydrALAZINE, LORazepam, morphine injection, ondansetron (ZOFRAN) IV, ondansetron, senna-docusate  Assessment/Plan: Bilateral wrist fractures  Right wrist x-ray on 05/17/2012 shows a minimally displaced and  comminuted distal right radial metaphyseal fracture. Left wrist x-ray on 05/17/2012 showed displaced ulnar styloid fracture and displaced angulated comminuted intra-articular distal left radial metaphyseal fracture. Dr. Merlyn Lot, hand ortho, evaluated the patient and reduce the fractures and splinted the wrist. Further workup as per Dr. Merlyn Lot.  Fall  Appears to be mechanical. No further workup.  Displaced fracture medial wall left orbit  Note reviewed from admitting M.D. Conversation with ophthalmology, Dr. Clarisa Kindred, ophthalmology, recommended conservative management. She indicated that the fracture will heal on its own. Recommended having the patient followup with her ophthalmologist after discharge.  hypertension  Known chronic kidney disease  Leukocytosis  Likely reactive from the fall and fractures. Chest x-ray and urinalysis not suggestive of infectious etiology.  Question mild aneurysmal dilatation of aortic arch on chest x-ray  . No plans for further evaluation  Left forehead bruise from the fall  Conservative management.  Dementia.  Medically stable for discharge to skilled nursing facility when bed is available  LOS: 2 days   Donevan Biller D 05/19/2012, 3:48 PM

## 2012-05-19 NOTE — Progress Notes (Signed)
Agree with PT treatment note.  Macy Polio, PT DPT [phone]  

## 2012-05-20 MED ORDER — HALOPERIDOL LACTATE 5 MG/ML IJ SOLN
1.0000 mg | Freq: Four times a day (QID) | INTRAMUSCULAR | Status: DC | PRN
Start: 2012-05-20 — End: 2012-05-21
  Administered 2012-05-20: 1 mg via INTRAMUSCULAR
  Filled 2012-05-20: qty 1

## 2012-05-20 MED ORDER — ACETAMINOPHEN 325 MG PO TABS: 650.0000 mg | ORAL_TABLET | Freq: Four times a day (QID) | ORAL | Status: DC | PRN

## 2012-05-20 NOTE — Progress Notes (Signed)
Bed offer received and accepted by [facility] by pt's husband.  Facility required dc summary by 3 pm;  They can accept patient tomorrow (Sat)- request her to be there by 1 pm. Will notify weekend coverage.  Husband spoke with admissions at [facility] and will follow up on Monday to complete d/c paperwork.  Notified Dr. Idelle Crouch office of above; awaiting d/c summary.  AVS has been forwarded to facility for review.  Lorri Frederick. [place]  [phone]

## 2012-05-20 NOTE — Discharge Summary (Signed)
Physician Discharge Summary  Patient ID: Amanda [name] MRN: [id number] DOB/AGE: 1946-07-04 66 y.o.  Admit date: 05/17/2012 Discharge date: 05/20/2012  Admission Diagnoses: Fall with resulting wrist fracture bilateral  Discharge Diagnoses:  Principal Problem:  *Wrist fracture, bilateral Active Problems:  Uncontrolled hypertension  Dementia  Fall  Renal insufficiency  Leukocytosis  Fracture of medial wall of orbit   Discharged Condition: stable  Hospital Course:  Patient [facility] room after sustaining a fall. This was a mechanical fall secondary to poor safety awareness. She sustained wrist fractures and orbital fracture. Patient's left wrist fracture was reduced, closed reduction by orthopedics, plans for further outpatient followup. She was placed in splint bilateral. As for her orbital fracture, conversation took place via the admitting physician and ophthalmology and recommended conservative management with outpatient followup. Patient essentially was hospitalized because of her poor safety awareness and her fall risk. The family could not manage her at home, therefore placement in a skilled nursing facility was indicated. Patient was pleasantly confused throughout her hospitalization without any complaints of pain. The bruising on the left side of herforhead improved. Patient's blood pressure remained controlled. Her renal dysfunction improved dramatically throughout hospitalization after IV fluids suggesting dehydration was a big contributor to her renal dysfunction i.e. Prerenal. At this time patient is medically stable for discharge to a skilled nursing facility     Bilateral wrist fractures  Right wrist x-ray on 05/17/2012 shows a minimally displaced and comminuted distal right radial metaphyseal fracture. Left wrist x-ray on 05/17/2012 showed displaced ulnar styloid fracture and displaced angulated comminuted intra-articular distal left radial  metaphyseal fracture. Dr. Merlyn Lot, hand ortho, evaluated the patient and reduce the fractures and splinted the wrist. Further workup as per Dr. Merlyn Lot.  Fall  Appears to be mechanical. No further workup.  Displaced fracture medial wall left orbit  Note reviewed from admitting M.D. Conversation with ophthalmology, Dr. Clarisa Kindred, ophthalmology, recommended conservative management. She indicated that the fracture will heal on its own. Recommended having the patient followup with her ophthalmologist after discharge.  hypertension  Known chronic kidney disease  Leukocytosis  Likely reactive from the fall and fractures. Chest x-ray and urinalysis not suggestive of infectious etiology.  Question mild aneurysmal dilatation of aortic arch on chest x-ray  . No plans for further evaluation  Left forehead bruise from the fall  Conservative management.  Dementia.     Consults: Treatment Team:  Katy Apo, MD  Significant Diagnostic Studies:Dg Chest 2 View  05/17/2012  *RADIOLOGY REPORT*  Clinical Data: Fall, hypertension, smoker, dementia  CHEST - 2 VIEW  Comparison: 08/23/2006  Findings: Enlargement of cardiac silhouette post median sternotomy. Calcified tortuous thoracic aorta with question aneurysmal dilatation of the aortic arch. Prominent right superior mediastinal soft tissues unchanged, potentially related to a tortuous innominate artery. Pulmonary vascularity normal. Lungs clear. No pleural effusion or pneumothorax. Diffuse osseous demineralization.  IMPRESSION: Enlargement of cardiac silhouette post median sternotomy. Atherosclerotic changes of the thoracic aorta with question mild aneurysmal dilatation at the aortic arch.   Original Report Authenticated By: Lollie Marrow, M.D.    Dg Wrist 2 Views Left  05/17/2012  *RADIOLOGY REPORT*  Clinical Data: Wrist fracture, post reduction  LEFT WRIST - 2 VIEW  Comparison: 05/17/2012  Findings: Cast material noted about the wrist.  Improved alignment of the  left distal radius and ulnar styloid fractures.  Mild soft tissue swelling.  IMPRESSION: Near anatomic alignment following reduction of the left distal radius and ulnar styloid fractures  Original Report Authenticated By: Judie Petit. Ruel Favors, M.D.    Dg Wrist Complete Left  05/17/2012  *RADIOLOGY REPORT*  Clinical Data:  Left wrist pain post fall  LEFT WRIST - COMPLETE 3+ VIEW  Comparison: None  Findings: Diffuse osseous demineralization. Displaced ulnar styloid fracture. Comminuted displaced intra-articular fracture distal left radial metaphysis with intra-articular extension into the radiocarpal and questionably distal radioulnar joints. Dorsal displacement of the distal radial fragments with apex volar angulation. Associated soft tissue swelling. No definite carpal fractures identified.  IMPRESSION: Displaced ulnar styloid fracture. Displaced angulated comminuted intra-articular distal left radial metaphyseal fracture.   Original Report Authenticated By: Lollie Marrow, M.D.    Dg Wrist Complete Right  05/17/2012  *RADIOLOGY REPORT*  Clinical Data: Fall, bilateral wrist injuries, struck right frontal region, history dementia, hypertension  RIGHT WRIST - COMPLETE 3+ VIEW  Comparison: None  Findings: Osseous demineralization. Comminuted metaphyseal fracture distal right radius with dorsal displacement of distal radial dorsal fragments. Overlying soft tissue swelling at the distal forearm and carpus. Joint spaces preserved. No additional fracture or dislocation identified.  IMPRESSION: Minimally displaced and comminuted distal right radial metaphyseal fracture. Osseous demineralization.   Original Report Authenticated By: Lollie Marrow, M.D.    Ct Head Wo Contrast  05/17/2012  *RADIOLOGY REPORT*  Clinical Data:  Dementia, fall, found on floor, frontal abrasion, left periorbital swelling  CT HEAD WITHOUT CONTRAST CT CERVICAL SPINE WITHOUT CONTRAST  Technique:  Multidetector CT imaging of the head and cervical  spine was performed following the standard protocol without intravenous contrast.  Multiplanar CT image reconstructions of the cervical spine were also generated.  Comparison:  CT head 12/25/2011  CT HEAD  Findings: Generalized atrophy. Stable ventricular morphology. No midline shift or mass effect. Small vessel chronic ischemic changes of deep cerebral white matter. 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 No intracranial hemorrhage, mass lesion or evidence of acute infarction. Small frontal scalp hematoma. Minimal motion artifacts at vertex, for which repeat imaging was performed. Atherosclerotic calcifications at skull base. Partially opacified ethmoid air cells and left frontal sinus with displaced fracture of the medial wall of the left orbit. Inferior aspect of left orbit not imaged. No definite calvarial fracture identified.  IMPRESSION: Atrophy with small vessel chronic ischemic changes of deep cerebral white matter. 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 No acute intracranial abnormalities. Displaced fracture medial wall left orbit, new.  CT CERVICAL SPINE  Findings: Prevertebral soft tissues normal thickness. Multilevel disc space narrowing and endplate spur formation. Multilevel facet degenerative changes bilaterally greatest at left C3-C4. Vertebral body heights maintained without fracture or subluxation. Visualized skull base intact. Lung apices clear. Scattered atherosclerotic calcifications in the carotid systems bilaterally.  IMPRESSION:  Degenerative disc and facet disease changes of the cervical spine as above. No acute cervical spine abnormalities.   Original Report Authenticated By: Lollie Marrow, M.D.    Ct Cervical Spine Wo Contrast  05/17/2012  *RADIOLOGY REPORT*  Clinical Data:  Dementia, fall, found on floor, frontal abrasion, left periorbital swelling  CT HEAD WITHOUT CONTRAST CT CERVICAL SPINE WITHOUT CONTRAST  Technique:  Multidetector CT imaging of the head and cervical spine was performed  following the standard protocol without intravenous contrast.  Multiplanar CT image reconstructions of the cervical spine were also generated.  Comparison:  CT head 12/25/2011  CT HEAD  Findings: Generalized atrophy. Stable ventricular morphology. No midline shift or mass effect. Small vessel chronic ischemic changes of deep cerebral white matter. 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 No intracranial hemorrhage, mass  lesion or evidence of acute infarction. Small frontal scalp hematoma. Minimal motion artifacts at vertex, for which repeat imaging was performed. Atherosclerotic calcifications at skull base. Partially opacified ethmoid air cells and left frontal sinus with displaced fracture of the medial wall of the left orbit. Inferior aspect of left orbit not imaged. No definite calvarial fracture identified.  IMPRESSION: Atrophy with small vessel chronic ischemic changes of deep cerebral white matter. 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 No acute intracranial abnormalities. Displaced fracture medial wall left orbit, new.  CT CERVICAL SPINE  Findings: Prevertebral soft tissues normal thickness. Multilevel disc space narrowing and endplate spur formation. Multilevel facet degenerative changes bilaterally greatest at left C3-C4. Vertebral body heights maintained without fracture or subluxation. Visualized skull base intact. Lung apices clear. Scattered atherosclerotic calcifications in the carotid systems bilaterally.  IMPRESSION:  Degenerative disc and facet disease changes of the cervical spine as above. No acute cervical spine abnormalities.   Original Report Authenticated By: Lollie Marrow, M.D.       Discharge Exam: Blood pressure 107/55, pulse 77, temperature 97.4 F (36.3 C), temperature source Oral, resp. rate 18, height 5\' 1"  (1.549 m), weight 71.2 kg (156 lb 15.5 oz), SpO2 96.00%. General appearance: pleasantly confused Head: resolving bruising, left periorbital region. Extraocular movement intact  bilateral otherwise HEENT exam unremarkable Resp: clear to auscultation bilaterally Cardio: regular rate and rhythm, S1, S2 normal, no murmur, click, rub or gallop Extremities: bilateral wrist splints  Disposition:      Medication List     As of 05/20/2012  1:13 PM    TAKE these medications         acetaminophen 325 MG tablet   Commonly known as: TYLENOL   Take 2 tablets (650 mg total) by mouth every 6 (six) hours as needed (or Fever >/= 101).      atorvastatin 10 MG tablet   Commonly known as: LIPITOR   Take 10 mg by mouth daily.      cloNIDine 0.1 MG tablet   Commonly known as: CATAPRES   Take 0.1 mg by mouth 2 (two) times daily.      donepezil 10 MG tablet   Commonly known as: ARICEPT   Take 10 mg by mouth at bedtime.      labetalol 300 MG tablet   Commonly known as: NORMODYNE   Take 300 mg by mouth 2 (two) times daily.      NAMENDA XR 28 MG Cp24   Generic drug: Memantine HCl ER   Take 28 mg by mouth daily.           Follow-up Information    Follow up with Shade Flood, MD.   Contact information:   [address] [facility] [place] [address] [phone]          Signed: Renford Dills D 05/20/2012, 1:13 PM

## 2012-05-20 NOTE — Progress Notes (Signed)
Clinical Social Work  CSW spoke with husband who prefers [facility]. Per chart review, patient is ready for dc but awaiting pasarr number. CSW called [facility] regarding patient's choice. SNF reports they can accept patient today if pasarr number is received but they need dc summary by 3pm. CSW will continue to follow.  [place], [place] [phone]

## 2012-05-21 MED ORDER — WHITE PETROLATUM GEL
Status: AC
  Filled 2012-05-21: qty 5

## 2012-05-21 MED ORDER — HALOPERIDOL 1 MG PO TABS: 1.0000 mg | ORAL_TABLET | Freq: Two times a day (BID) | ORAL | Status: DC | PRN

## 2012-05-21 NOTE — Progress Notes (Signed)
Patient became restless in her bed shortly before 0300, and 3 staff members went in to try to settle her down.  Upon finding that patient had urinated in bed, staff attempted to change linens, patient became increasingly agitated, and as linens were adjusted patient bit myself on my upper left arm, breaking and bruising the skin and drawing a scant amount of blood.

## 2012-05-21 NOTE — Progress Notes (Signed)
At beginning of shift pt was incredibly agitated, combative with staff, impulsive, constantly attempting to get OOB and very unsteady on feet.  It took 3+ staff members to calm her down with no success. Orders received for a sitter and Haldol which was given by her RN. No sitter available at that time and also no sitter available at 2300. She was transferred into 5N32 to be closer to the nurses station and she currently is more calm and cooperative. Family arrived and is staying the night with patient. We are trying to not use a sitter since she is to be discharged to SNF today. At this time she has not needed the sitter. Currently asleep with family at bedside. Will continue to monitor.    Asher Muir, Charge RN

## 2012-05-21 NOTE — Progress Notes (Signed)
Subjective: Some agitation last night, responded to haldol.  Slept well.  Objective: Vital signs in last 24 hours: Temp:  [98.1 F (36.7 C)-98.4 F (36.9 C)] 98.4 F (36.9 C) (10/11 2259) Pulse Rate:  [72-81] 76  (10/12 0245) Resp:  [18] 18  (10/11 2259) BP: (131-177)/(62-82) 146/62 mmHg (10/12 0245) SpO2:  [98 %-100 %] 100 % (10/11 2259) Weight change:  Last BM Date: 05/17/12  Intake/Output from previous day: 10/11 0701 - 10/12 0700 In: 360 [P.O.:360] Out: -  Intake/Output this shift:    General appearance: slowed mentation  Lab Results:  [facility] 05/18/12 0939  WBC 8.3  HGB 12.1  HCT 35.9*  PLT 180   BMET  Basename 05/18/12 0939  NA 139  K 3.5  CL 101  CO2 23  GLUCOSE 162*  BUN 17  CREATININE 1.11*  CALCIUM 9.6    Studies/Results: No results found.  Medications: I have reviewed the patient's current medications.  Assessment/Plan: Dementia ready for discharge.  Haldol prn order written   LOS: 4 days   Sutton Hirsch JOSEPH 05/21/2012, 7:32 AM

## 2012-05-21 NOTE — Progress Notes (Signed)
Pt to transfer to [facility] today via [facility].  Pt's husband at bedside and aware of d/c--pt not oriented. D/C summ and AVS faxed to [facility] D/C packet complete with signed FL2 and chart copy. CSW signing off as no other CSW needs identified at this time.  Dellie Burns, MSW, LCSWA [phone] (Weekends 8:00am-4:30pm)

## 2012-05-21 NOTE — Progress Notes (Signed)
Pt discharged to [facility] via [facility]. All belongings sent with pt.

## 2012-05-27 ENCOUNTER — Other Ambulatory Visit: Payer: Self-pay | Admitting: Orthopedic Surgery

## 2012-05-30 NOTE — Progress Notes (Signed)
Pt in skilled care [facility]-will need istat

## 2012-05-31 ENCOUNTER — Ambulatory Visit (HOSPITAL_BASED_OUTPATIENT_CLINIC_OR_DEPARTMENT_OTHER)
Admission: RE | Admit: 2012-05-31 | Discharge: 2012-05-31 | Disposition: A | Discharge status: Skilled Nursing Facility | Payer: Medicare Other | Source: Ambulatory Visit | Attending: Orthopedic Surgery | Admitting: Orthopedic Surgery

## 2012-05-31 ENCOUNTER — Encounter (HOSPITAL_BASED_OUTPATIENT_CLINIC_OR_DEPARTMENT_OTHER): Payer: Self-pay

## 2012-05-31 ENCOUNTER — Encounter (HOSPITAL_BASED_OUTPATIENT_CLINIC_OR_DEPARTMENT_OTHER): Payer: Self-pay | Admitting: Anesthesiology

## 2012-05-31 ENCOUNTER — Ambulatory Visit (HOSPITAL_BASED_OUTPATIENT_CLINIC_OR_DEPARTMENT_OTHER): Payer: Medicare Other | Admitting: Anesthesiology

## 2012-05-31 ENCOUNTER — Encounter (HOSPITAL_BASED_OUTPATIENT_CLINIC_OR_DEPARTMENT_OTHER)
Admission: RE | Disposition: A | Discharge status: Skilled Nursing Facility | Payer: Self-pay | Source: Ambulatory Visit | Attending: Orthopedic Surgery

## 2012-05-31 DIAGNOSIS — G309 Alzheimer's disease, unspecified: Secondary | ICD-10-CM | POA: Insufficient documentation

## 2012-05-31 DIAGNOSIS — S52599A Other fractures of lower end of unspecified radius, initial encounter for closed fracture: Secondary | ICD-10-CM | POA: Insufficient documentation

## 2012-05-31 DIAGNOSIS — W19XXXA Unspecified fall, initial encounter: Secondary | ICD-10-CM | POA: Insufficient documentation

## 2012-05-31 DIAGNOSIS — F0280 Dementia in other diseases classified elsewhere without behavioral disturbance: Secondary | ICD-10-CM | POA: Insufficient documentation

## 2012-05-31 DIAGNOSIS — I10 Essential (primary) hypertension: Secondary | ICD-10-CM | POA: Insufficient documentation

## 2012-05-31 LAB — POCT I-STAT, CHEM 8
Chloride: 106 mEq/L (ref 96–112)
Creatinine, Ser: 1.7 mg/dL — ABNORMAL HIGH (ref 0.50–1.10)
Glucose, Bld: 86 mg/dL (ref 70–99)
Potassium: 4.1 mEq/L (ref 3.5–5.1)

## 2012-05-31 SURGERY — OPEN REDUCTION INTERNAL FIXATION (ORIF) DISTAL RADIUS FRACTURE
Anesthesia: General | Laterality: Bilateral

## 2012-05-31 MED ORDER — DEXAMETHASONE SODIUM PHOSPHATE 10 MG/ML IJ SOLN
INTRAMUSCULAR | Status: DC | PRN
  Administered 2012-05-31: 5 mg via INTRAVENOUS

## 2012-05-31 MED ORDER — FENTANYL CITRATE 0.05 MG/ML IJ SOLN
INTRAMUSCULAR | Status: DC | PRN
  Administered 2012-05-31: 25 ug via INTRAVENOUS
  Administered 2012-05-31: 12.5 ug via INTRAVENOUS
  Administered 2012-05-31 (×2): 50 ug via INTRAVENOUS

## 2012-05-31 MED ORDER — HYDROCODONE-ACETAMINOPHEN 5-325 MG PO TABS: ORAL_TABLET | ORAL | Status: DC

## 2012-05-31 MED ORDER — MIDAZOLAM HCL 2 MG/2ML IJ SOLN: 1.0000 mg | INTRAMUSCULAR | Status: DC | PRN

## 2012-05-31 MED ORDER — GLYCOPYRROLATE 0.2 MG/ML IJ SOLN
INTRAMUSCULAR | Status: DC | PRN
  Administered 2012-05-31 (×2): 0.1 mg via INTRAVENOUS

## 2012-05-31 MED ORDER — CHLORHEXIDINE GLUCONATE 4 % EX LIQD: 60.0000 mL | Freq: Once | CUTANEOUS | Status: DC

## 2012-05-31 MED ORDER — CEFAZOLIN SODIUM-DEXTROSE 2-3 GM-% IV SOLR
2.0000 g | Freq: Once | INTRAVENOUS | Status: AC
  Administered 2012-05-31: 2 g via INTRAVENOUS

## 2012-05-31 MED ORDER — LACTATED RINGERS IV SOLN
INTRAVENOUS | Status: DC
  Administered 2012-05-31 (×2): via INTRAVENOUS

## 2012-05-31 MED ORDER — EPHEDRINE SULFATE 50 MG/ML IJ SOLN
INTRAMUSCULAR | Status: DC | PRN
  Administered 2012-05-31 (×2): 10 mg via INTRAVENOUS

## 2012-05-31 MED ORDER — ONDANSETRON HCL 4 MG/2ML IJ SOLN
INTRAMUSCULAR | Status: DC | PRN
  Administered 2012-05-31: 4 mg via INTRAVENOUS

## 2012-05-31 MED ORDER — PROPOFOL 10 MG/ML IV BOLUS
INTRAVENOUS | Status: DC | PRN
  Administered 2012-05-31: 100 mg via INTRAVENOUS

## 2012-05-31 MED ORDER — BUPIVACAINE HCL (PF) 0.25 % IJ SOLN
INTRAMUSCULAR | Status: DC | PRN
  Administered 2012-05-31: 10 mL

## 2012-05-31 SURGICAL SUPPLY — 77 items
BANDAGE CONFORM 3  STR LF (GAUZE/BANDAGES/DRESSINGS) IMPLANT
BANDAGE ELASTIC 3 VELCRO ST LF (GAUZE/BANDAGES/DRESSINGS) ×3 IMPLANT
BANDAGE GAUZE ELAST BULKY 4 IN (GAUZE/BANDAGES/DRESSINGS) ×3 IMPLANT
BIT DRILL 2.0 LNG QUCK RELEASE (BIT) IMPLANT
BIT DRILL 2.8X5 QR DISP (BIT) ×2 IMPLANT
BLADE MINI RND TIP GREEN BEAV (BLADE) IMPLANT
BLADE SURG 15 STRL LF DISP TIS (BLADE) ×2 IMPLANT
BLADE SURG 15 STRL SS (BLADE) ×8
BNDG CMPR 9X4 STRL LF SNTH (GAUZE/BANDAGES/DRESSINGS) ×1
BNDG CMPR MD 5X2 ELC HKLP STRL (GAUZE/BANDAGES/DRESSINGS)
BNDG ELASTIC 2 VLCR STRL LF (GAUZE/BANDAGES/DRESSINGS) ×1 IMPLANT
BNDG ESMARK 4X9 LF (GAUZE/BANDAGES/DRESSINGS) ×2 IMPLANT
CHLORAPREP W/TINT 26ML (MISCELLANEOUS) ×3 IMPLANT
CLOTH BEACON ORANGE TIMEOUT ST (SAFETY) ×2 IMPLANT
CORDS BIPOLAR (ELECTRODE) ×2 IMPLANT
COVER MAYO STAND STRL (DRAPES) ×3 IMPLANT
COVER TABLE BACK 60X90 (DRAPES) ×2 IMPLANT
DRAPE EXTREMITY T 121X128X90 (DRAPE) ×3 IMPLANT
DRAPE OEC MINIVIEW 54X84 (DRAPES) ×2 IMPLANT
DRAPE SURG 17X23 STRL (DRAPES) ×3 IMPLANT
DRILL 2.0 LNG QUICK RELEASE (BIT) ×2
GAUZE XEROFORM 1X8 LF (GAUZE/BANDAGES/DRESSINGS) ×4 IMPLANT
GLOVE BIO SURGEON STRL SZ7.5 (GLOVE) ×2 IMPLANT
GLOVE BIOGEL PI IND STRL 8 (GLOVE) ×1 IMPLANT
GLOVE BIOGEL PI IND STRL 8.5 (GLOVE) IMPLANT
GLOVE BIOGEL PI INDICATOR 8 (GLOVE) ×1
GLOVE BIOGEL PI INDICATOR 8.5 (GLOVE) ×1
GLOVE SURG ORTHO 8.0 STRL STRW (GLOVE) ×1 IMPLANT
GOWN BRE IMP PREV XXLGXLNG (GOWN DISPOSABLE) ×3 IMPLANT
GOWN PREVENTION PLUS XLARGE (GOWN DISPOSABLE) ×2 IMPLANT
GOWN STRL REIN XL XLG (GOWN DISPOSABLE) ×4 IMPLANT
GUIDEWIRE ORTHO 0.054X6 (WIRE) ×6 IMPLANT
NEEDLE HYPO 22GX1.5 SAFETY (NEEDLE) IMPLANT
NEEDLE HYPO 25X1 1.5 SAFETY (NEEDLE) ×2 IMPLANT
NS IRRIG 1000ML POUR BTL (IV SOLUTION) ×2 IMPLANT
PACK BASIN DAY SURGERY FS (CUSTOM PROCEDURE TRAY) ×2 IMPLANT
PAD CAST 3X4 CTTN HI CHSV (CAST SUPPLIES) ×2 IMPLANT
PAD CAST 4YDX4 CTTN HI CHSV (CAST SUPPLIES) IMPLANT
PADDING CAST ABS 4INX4YD NS (CAST SUPPLIES) ×1
PADDING CAST ABS COTTON 4X4 ST (CAST SUPPLIES) ×1 IMPLANT
PADDING CAST COTTON 3X4 STRL (CAST SUPPLIES) ×4
PADDING CAST COTTON 4X4 STRL (CAST SUPPLIES)
PLATE ACULOC 2 VDR STD LT (Plate) ×2 IMPLANT
PLATE ACULOCK 2 NARROW RT (Plate) ×1 IMPLANT
SCREW 3.5MMX10.0MM (Screw) ×2 IMPLANT
SCREW 3.5MMX12.0MM (Screw) ×8 IMPLANT
SCREW CORT 24X2.3XNONTOGGLE (Screw) IMPLANT
SCREW CORT 3.5X16 (Screw) ×1 IMPLANT
SCREW CORT FT 18X2.3XLCK HEX (Screw) ×2 IMPLANT
SCREW CORT FT 20X2.3XLCK HEX (Screw) ×2 IMPLANT
SCREW CORTICAL 2.3X24 (Screw) ×2 IMPLANT
SCREW CORTICAL LOCKING 2.3X18M (Screw) ×14 IMPLANT
SCREW CORTICAL LOCKING 2.3X20M (Screw) ×8 IMPLANT
SCREW CORTICAL LOCKING 2.3X22M (Screw) ×2 IMPLANT
SCREW FX18X2.3XSMTH LCK NS CRT (Screw) IMPLANT
SCREW FX20X2.3XSMTH LCK NS CRT (Screw) IMPLANT
SCREW FX22X2.3XLCK SMTH NS CRT (Screw) IMPLANT
SLEEVE SCD COMPRESS KNEE MED (MISCELLANEOUS) IMPLANT
SPLINT PLASTER CAST XFAST 3X15 (CAST SUPPLIES) IMPLANT
SPLINT PLASTER CAST XFAST 4X15 (CAST SUPPLIES) IMPLANT
SPLINT PLASTER XTRA FAST SET 4 (CAST SUPPLIES)
SPLINT PLASTER XTRA FASTSET 3X (CAST SUPPLIES) ×12
SPONGE GAUZE 4X4 12PLY (GAUZE/BANDAGES/DRESSINGS) ×3 IMPLANT
STOCKINETTE 4X48 STRL (DRAPES) ×4 IMPLANT
SUCTION FRAZIER TIP 10 FR DISP (SUCTIONS) IMPLANT
SUT ETHILON 3 0 PS 1 (SUTURE) IMPLANT
SUT ETHILON 4 0 CL P 3 (SUTURE) ×2 IMPLANT
SUT ETHILON 4 0 PS 2 18 (SUTURE) ×2 IMPLANT
SUT VIC AB 3-0 PS1 18 (SUTURE)
SUT VIC AB 3-0 PS1 18XBRD (SUTURE) IMPLANT
SUT VICRYL 4-0 PS2 18IN ABS (SUTURE) ×4 IMPLANT
SYR BULB 3OZ (MISCELLANEOUS) ×2 IMPLANT
SYR CONTROL 10ML LL (SYRINGE) ×1 IMPLANT
TOWEL OR 17X24 6PK STRL BLUE (TOWEL DISPOSABLE) ×4 IMPLANT
TUBE CONNECTING 20X1/4 (TUBING) IMPLANT
UNDERPAD 30X30 INCONTINENT (UNDERPADS AND DIAPERS) ×4 IMPLANT
WATER STERILE IRR 1000ML POUR (IV SOLUTION) ×1 IMPLANT

## 2012-05-31 NOTE — H&P (Signed)
  Amanda [name] is an 66 y.o. female.   Chief Complaint: bilateral distal radius fractures HPI: 66 yo female fell on 05/17/12 injuring bilateral wrists.  Taken to [facility] where XR revealed bilateral distal radius fractures.  Closed reduction of left.  Followed up in office.  Reports no previous injury to wrists.  Presents with husband.  Past Medical History  Diagnosis Date  . Hypertension   . Stroke ~ 2003    "slight memory loss" (05/18/2012)  . Gout     "?feet" (05/18/2012)  . Dementia     "alzheimer's" (05/18/2012)  . Incontinence of urine   . Wrist fracture, bilateral 05/17/2012    "fell down steps" (05/18/2012)    Past Surgical History  Procedure Date  . Vaginal hysterectomy     Family History  Problem Relation Age of Onset  . Dementia Mother   . Dementia Father   . Dementia Sister   . Dementia Brother    Social History:  reports that she has been smoking Cigarettes.  She has never used smokeless tobacco. She reports that she drinks alcohol. She reports that she uses illicit drugs (Marijuana).  Allergies: No Known Allergies  Medications Prior to Admission  Medication Sig Dispense Refill  . acetaminophen (TYLENOL) 325 MG tablet Take 2 tablets (650 mg total) by mouth every 6 (six) hours as needed (or Fever >/= 101).      Marland Kitchen atorvastatin (LIPITOR) 10 MG tablet Take 10 mg by mouth daily.      . cloNIDine (CATAPRES) 0.1 MG tablet Take 0.1 mg by mouth 2 (two) times daily.      Marland Kitchen donepezil (ARICEPT) 10 MG tablet Take 10 mg by mouth at bedtime.      . haloperidol (HALDOL) 1 MG tablet Take 1 tablet (1 mg total) by mouth 2 (two) times daily as needed.  30 tablet  11  . labetalol (NORMODYNE) 300 MG tablet Take 300 mg by mouth 2 (two) times daily.      . Memantine HCl ER (NAMENDA XR) 28 MG CP24 Take 28 mg by mouth daily.        No results found for this or any previous visit (from the past 48 hour(s)).  No results found.   A comprehensive review of systems was negative except  for: Eyes: positive for cataracts and contacts/glasses  Blood pressure 133/82, pulse 54, temperature 97.6 F (36.4 C), temperature source Oral, resp. rate 18, height 5\' 1"  (1.549 m), weight 70.308 kg (155 lb), SpO2 99.00%.  General appearance: alert, cooperative and appears stated age Head: Normocephalic, without obvious abnormality, atraumatic Neck: supple, symmetrical, trachea midline Resp: clear to auscultation bilaterally Cardio: regular rate and rhythm GI: non tender Extremities: intact sensation and capillary refill all digits.  +epl/fpl/io. Pulses: 2+ and symmetric Skin: Skin color, texture, turgor normal. No rashes or lesions Neurologic: Grossly normal Incision/Wound: na  Assessment/Plan Bilateral distal radius fractures with comminution.  Non operative and operative treatment options were discussed with the patient and husband and they wish to proceed with operative treatment. Risks, benefits, and alternatives of surgery were discussed and the patient agrees with the plan of care.    Prospero Mahnke R 05/31/2012, 8:55 AM

## 2012-05-31 NOTE — Anesthesia Postprocedure Evaluation (Signed)
  Anesthesia Post-op Note  Patient: Amanda [name]  Procedure(s) Performed: Procedure(s) (LRB) with comments: OPEN REDUCTION INTERNAL FIXATION (ORIF) DISTAL RADIAL FRACTURE (Bilateral)  Patient Location: PACU  Anesthesia Type: General  Level of Consciousness: awake and alert   Airway and Oxygen Therapy: Patient Spontanous Breathing and Patient connected to face mask oxygen  Post-op Pain: mild  Post-op Assessment: Post-op Vital signs reviewed  Post-op Vital Signs: Reviewed  Complications: No apparent anesthesia complications

## 2012-05-31 NOTE — Anesthesia Preprocedure Evaluation (Addendum)
Anesthesia Evaluation  Patient identified by MRN, date of birth, ID band Patient awake    Reviewed: Allergy & Precautions, H&P , NPO status , Patient's Chart, lab work & pertinent test results, reviewed documented beta blocker date and time   Airway Mallampati: I TM Distance: >3 FB Neck ROM: Full    Dental  (+) Teeth Intact and Dental Advisory Given   Pulmonary  breath sounds clear to auscultation        Cardiovascular hypertension, Pt. on medications and Pt. on home beta blockers Angina: Cabg hx?  Denies SOB or CP. + CAD (CABG HX. Denies CP or SOB) Rhythm:Regular Rate:Normal     Neuro/Psych ? Dementia. Answers questions appropriately    GI/Hepatic   Endo/Other    Renal/GU      Musculoskeletal   Abdominal   Peds  Hematology   Anesthesia Other Findings Bilateral splints on wrist/forarms.  Reproductive/Obstetrics                           Anesthesia Physical Anesthesia Plan  ASA: III  Anesthesia Plan: General   Post-op Pain Management:    Induction: Intravenous  Airway Management Planned: LMA  Additional Equipment:   Intra-op Plan:   Post-operative Plan: Extubation in OR  Informed Consent: I have reviewed the patients History and Physical, chart, labs and discussed the procedure including the risks, benefits and alternatives for the proposed anesthesia with the patient or authorized representative who has indicated his/her understanding and acceptance.   Dental advisory given  Plan Discussed with: CRNA, Anesthesiologist and Surgeon  Anesthesia Plan Comments:         Anesthesia Quick Evaluation

## 2012-05-31 NOTE — Anesthesia Procedure Notes (Signed)
Procedure Name: LMA Insertion Date/Time: 05/31/2012 9:16 AM Performed by: Burna Cash Pre-anesthesia Checklist: Patient identified, Emergency Drugs available, Suction available and Patient being monitored Patient Re-evaluated:Patient Re-evaluated prior to inductionOxygen Delivery Method: Circle System Utilized Preoxygenation: Pre-oxygenation with 100% oxygen Intubation Type: IV induction Ventilation: Mask ventilation without difficulty LMA: LMA inserted LMA Size: 4.0 Number of attempts: 1 Airway Equipment and Method: bite block Placement Confirmation: positive ETCO2 Tube secured with: Tape Dental Injury: Teeth and Oropharynx as per pre-operative assessment

## 2012-05-31 NOTE — Transfer of Care (Signed)
Immediate Anesthesia Transfer of Care Note  Patient: Amanda [name]  Procedure(s) Performed: Procedure(s) (LRB) with comments: OPEN REDUCTION INTERNAL FIXATION (ORIF) DISTAL RADIAL FRACTURE (Bilateral)  Patient Location: PACU  Anesthesia Type: General  Level of Consciousness: sedated  Airway & Oxygen Therapy: Patient Spontanous Breathing and Patient connected to face mask oxygen  Post-op Assessment: Report given to PACU RN and Post -op Vital signs reviewed and stable  Post vital signs: Reviewed and stable  Complications: No apparent anesthesia complications

## 2012-05-31 NOTE — Op Note (Signed)
Dictation [phone]

## 2012-06-01 NOTE — Op Note (Deleted)
NAME:  Amanda [name], Amanda [name]          ACCOUNT NO.:  [id number]  MEDICAL RECORD NO.:  [id number]  LOCATION:  5N32C                        FACILITY:  [facility]  PHYSICIAN:  Haddon Fyfe, MD        DATE OF BIRTH:  12/27/1945  DATE OF PROCEDURE:  05/31/2012 DATE OF DISCHARGE:  05/31/2012                              OPERATIVE REPORT   PREOPERATIVE DIAGNOSIS:  Bilateral distal radius fractures.  POSTOPERATIVE DIAGNOSIS:  Bilateral distal radius fractures.  PROCEDURE:  Open reduction and internal fixation bilateral distal radius fractures, comminuted intra-articular on the left.  SURGEON:  Anandi Abramo, MD  ASSISTANT:  Gary Reema Chick, M.D.  ANESTHESIA:  General.  IV FLUIDS:  Per anesthesia flow sheet.  ESTIMATED BLOOD LOSS:  Minimal.  COMPLICATIONS:  None.  SPECIMENS:  None.  TOURNIQUET TIME:  Right upper extremity 49 minutes.  Left upper extremity 73 minutes.  DISPOSITION:  Stable to PACU.  INDICATIONS: Amanda [name] is a 66-year-old female who 2 weeks ago  fell injuring bilateral wrists. She was taken to the [facility]  [facility]gency Department where radiographs were taken revealing bilateral  distal radius fractures. Closed reduction was performed on the left in  the emergency department due to significant displacement. She followed  up in the office. She is present with her husband. We discussed  nonoperative and operative treatment options for treatment of distal  radius fractures. Risks, benefits, and alternatives of surgery were  discussed including the risk of blood loss, infection, damage to nerves,  vessels, tendons, ligaments, bone; failure of surgery; need for  additional surgery, complications with wound healing; continued pain;  nonunion; malunion; stiffness. They voiced understanding of these risks  and elected to proceed.  OPERATIVE COURSE: After being identified preoperatively by myself, the  patient and I agreed upon procedure and site procedure. Surgical site  was  marked. The risks, benefits, and alternatives of surgery were  reviewed and they wished to proceed. Surgical consent had been signed.  She was given 2 g of IV Ancef as preoperative antibiotic prophylaxis.  She was transported to the operating room and placed on the operative  table in supine position with the right upper extremity on arm board.  General anesthesia was induced by anesthesiologist. The right upper  extremity was prepped and draped in normal sterile orthopedic fashion.  Surgical pause performed between surgeons, anesthesia, operating staff,  and all were in agreement as to the patient procedure and site of  procedure. Tourniquet at the proximal aspect of the extremity was  inflated to 250 mmHg after exsanguination of the limb with an Esmarch  bandage. Standard volar Henry approach was used. The superficial and  deep portions of the FCR tendon sheath were incised and the FCR and FPL  swept ulnarly to protect the palmar cutaneous branch of median nerve.  The brachioradialis was released from the radial side of the wrist. The  operative drapes was released and elevated using the periosteal  elevator. Fracture site was easily identified. A narrow plate from the  Acumed volar distal radial locking set was selected. It was placed onto  the distal fragment and secured using guide pins. C-arm was used in AP  and lateral projections to ensure appropriate   reduction and position of  hardware. The plate was going to be used to regain the volar tilt. All  the distal holes were filled using standard AO drilling and measuring  technique. Smooth locking pegs were used in all holes except for the 2  radial styloid holes in which locking screws were placed. The holes in  the shaft of the plate were then filled using nonlocking cortical  screws. This provided good reduction of the fracture. C-arm was used  in AP and lateral projections to ensure appropriate reduction and  position of hardware,  which was the case. The wound was copiously  irrigated with sterile saline. The pronator quadratus was repaired back  over top of the plate using 4-0 Vicryl suture. A single inverted Vicryl  Suture was placed in the subcutaneous tissues and skin was closed with 4-0  nylon. The wound was dressed with sterile Xeroform, 4x4s, and wrapped  with Kerlix. A volar splint was placed and wrapped with Kerlix and Ace  bandage. Tourniquet was deflated at 49 minutes. The fingertips were  pink with brisk capillary refill after deflation of tourniquet.  Operative drapes were broken down. The left upper extremity was then  prepped and draped in normal sterile orthopedic fashion. The tourniquet  proximal aspect of the extremity was inflated to 250 mmHg after  exsanguination of the limb with an Esmarch bandage. Again standard  volar Henry approach was used. The superficial and deep portions of the  FCR tendon sheath were incised. The FCR and FPL were swept ulnarly to  protect the palmar cutaneous branch of median nerve. The  brachioradialis was released from the radial side of the radius and the  pronator quadratus released and elevated using the periosteal elevator.  The fracture site was identified. It was reduced under direct  visualization. A standard volar distal radial locking plate was  selected and secured to the bone using the guide pins. C-arm was used  in AP and lateral projections to ensure appropriate reduction, which was  the case. A single screw was placed in the slotted hole in the shaft of  the plate. A nonlocking screw was then placed in the distal ulnar sided  hole in the distal aspect of the plate. This brought the bone up to the  plate. The remaining holes were filled using standard AO drilling and  measuring technique with locking pegs with the exception of the radial  styloid screws, which were filled with locking screws. The remaining 2  holes in the shaft of the plate were then  filled with nonlocking screws.  C-arm was used in AP and lateral projections to ensure appropriate  reduction and position of hardware, which was the case. There was no  intra-articular penetration. The wound was irrigated copiously with  sterile saline. The pronator quadratus was repaired using 4-0 Vicryl  suture. 4-0 Vicryl suture was used for a single inverted suture in the  subcutaneous tissues. The skin was closed with 4-0 nylon. The wound  was injected with 10 mL of 0.25% plain Marcaine to aid in postoperative  analgesia. The wound was then dressed with sterile Xeroform, 4 x 4s,  and wrapped with Kerlix. A volar splint was placed and wrapped with  Kerlix and Ace bandage. Tourniquet was deflated at 73 minutes. The  fingertips were pink with brisk capillary refill after deflation of  tourniquet. Operative drapes were broken down. The patient was awoken  from anesthesia safely. She was transferred back to the stretcher and    taken to PACU in stable condition. I will see her back in the office in  1 week for postoperative followup. I will give her Norco 5/325 one to  two p.o. q.6 hours p.r.n. pain, dispensed #50.        Harjit Douds, MD    KK/MEDQ  D:  05/31/2012  T:  06/01/2012  Job:  [id number] 

## 2012-06-01 NOTE — Op Note (Signed)
NAME:  Amanda [name], MARCIAL NO.:  [id number]  MEDICAL RECORD NO.:  [id number]  LOCATION:  5N32C                        FACILITY:  [facility]  PHYSICIAN:  Betha Loa, MD        DATE OF BIRTH:  11-29-1945  DATE OF PROCEDURE:  05/31/2012 DATE OF DISCHARGE:  05/31/2012                              OPERATIVE REPORT   PREOPERATIVE DIAGNOSIS:  Bilateral distal radius fractures.  POSTOPERATIVE DIAGNOSIS:  Bilateral distal radius fractures.  PROCEDURE:  Open reduction and internal fixation bilateral distal radius fractures, comminuted intra-articular on the left.  SURGEON:  Betha Loa, MD  ASSISTANT:  Cindee Salt, M.D.  ANESTHESIA:  General.  IV FLUIDS:  Per anesthesia flow sheet.  ESTIMATED BLOOD LOSS:  Minimal.  COMPLICATIONS:  None.  SPECIMENS:  None.  TOURNIQUET TIME:  Right upper extremity 49 minutes.  Left upper extremity 73 minutes.  DISPOSITION:  Stable to PACU.  INDICATIONS: [name] is a 66 year old female who 2 weeks ago  fell injuring bilateral wrists. She was taken to the [facility]  Emergency Department where radiographs were taken revealing bilateral  distal radius fractures. Closed reduction was performed on the left in  the emergency department due to significant displacement. She followed  up in the office. She is present with her husband. We discussed  nonoperative and operative treatment options for treatment of distal  radius fractures. Risks, benefits, and alternatives of surgery were  discussed including the risk of blood loss, infection, damage to nerves,  vessels, tendons, ligaments, bone; failure of surgery; need for  additional surgery, complications with wound healing; continued pain;  nonunion; malunion; stiffness. They voiced understanding of these risks  and elected to proceed.  OPERATIVE COURSE: After being identified preoperatively by myself, the  patient and I agreed upon procedure and site procedure. Surgical site  was  marked. The risks, benefits, and alternatives of surgery were  reviewed and they wished to proceed. Surgical consent had been signed.  She was given 2 g of IV Ancef as preoperative antibiotic prophylaxis.  She was transported to the operating room and placed on the operative  table in supine position with the right upper extremity on arm board.  General anesthesia was induced by anesthesiologist. The right upper  extremity was prepped and draped in normal sterile orthopedic fashion.  Surgical pause performed between surgeons, anesthesia, operating staff,  and all were in agreement as to the patient procedure and site of  procedure. Tourniquet at the proximal aspect of the extremity was  inflated to 250 mmHg after exsanguination of the limb with an Esmarch  bandage. Standard volar Sherilyn Cooter approach was used. The superficial and  deep portions of the FCR tendon sheath were incised and the FCR and FPL  swept ulnarly to protect the palmar cutaneous branch of median nerve.  The brachioradialis was released from the radial side of the wrist. The  operative drapes was released and elevated using the periosteal  elevator. Fracture site was easily identified. A narrow plate from the  Acumed volar distal radial locking set was selected. It was placed onto  the distal fragment and secured using guide pins. C-arm was used in AP  and lateral projections to ensure appropriate  reduction and position of  hardware. The plate was going to be used to regain the volar tilt. All  the distal holes were filled using standard AO drilling and measuring  technique. Smooth locking pegs were used in all holes except for the 2  radial styloid holes in which locking screws were placed. The holes in  the shaft of the plate were then filled using nonlocking cortical  screws. This provided good reduction of the fracture. C-arm was used  in AP and lateral projections to ensure appropriate reduction and  position of hardware,  which was the case. The wound was copiously  irrigated with sterile saline. The pronator quadratus was repaired back  over top of the plate using 4-0 Vicryl suture. A single inverted Vicryl  Suture was placed in the subcutaneous tissues and skin was closed with 4-0  nylon. The wound was dressed with sterile Xeroform, 4x4s, and wrapped  with Kerlix. A volar splint was placed and wrapped with Kerlix and Ace  bandage. Tourniquet was deflated at 49 minutes. The fingertips were  pink with brisk capillary refill after deflation of tourniquet.  Operative drapes were broken down. The left upper extremity was then  prepped and draped in normal sterile orthopedic fashion. The tourniquet  proximal aspect of the extremity was inflated to 250 mmHg after  exsanguination of the limb with an Esmarch bandage. Again standard  volar Sherilyn Cooter approach was used. The superficial and deep portions of the  FCR tendon sheath were incised. The FCR and FPL were swept ulnarly to  protect the palmar cutaneous branch of median nerve. The  brachioradialis was released from the radial side of the radius and the  pronator quadratus released and elevated using the periosteal elevator.  The fracture site was identified. It was reduced under direct  visualization. A standard volar distal radial locking plate was  selected and secured to the bone using the guide pins. C-arm was used  in AP and lateral projections to ensure appropriate reduction, which was  the case. A single screw was placed in the slotted hole in the shaft of  the plate. A nonlocking screw was then placed in the distal ulnar sided  hole in the distal aspect of the plate. This brought the bone up to the  plate. The remaining holes were filled using standard AO drilling and  measuring technique with locking pegs with the exception of the radial  styloid screws, which were filled with locking screws. The remaining 2  holes in the shaft of the plate were then  filled with nonlocking screws.  C-arm was used in AP and lateral projections to ensure appropriate  reduction and position of hardware, which was the case. There was no  intra-articular penetration. The wound was irrigated copiously with  sterile saline. The pronator quadratus was repaired using 4-0 Vicryl  suture. 4-0 Vicryl suture was used for a single inverted suture in the  subcutaneous tissues. The skin was closed with 4-0 nylon. The wound  was injected with 10 mL of 0.25% plain Marcaine to aid in postoperative  analgesia. The wound was then dressed with sterile Xeroform, 4 x 4s,  and wrapped with Kerlix. A volar splint was placed and wrapped with  Kerlix and Ace bandage. Tourniquet was deflated at 73 minutes. The  fingertips were pink with brisk capillary refill after deflation of  tourniquet. Operative drapes were broken down. The patient was awoken  from anesthesia safely. She was transferred back to the stretcher and  taken to PACU in stable condition. I will see her back in the office in  1 week for postoperative followup. I will give her Norco 5/325 one to  two p.o. q.6 hours p.r.n. pain, dispensed #50.        Betha Loa, MD    KK/MEDQ  D:  05/31/2012  T:  06/01/2012  Job:  [id number]

## 2012-10-24 ENCOUNTER — Other Ambulatory Visit: Payer: Self-pay

## 2012-10-24 DIAGNOSIS — Z1231 Encounter for screening mammogram for malignant neoplasm of breast: Secondary | ICD-10-CM

## 2012-11-25 ENCOUNTER — Ambulatory Visit
Admission: RE | Admit: 2012-11-25 | Discharge: 2012-11-25 | Disposition: A | Discharge status: Home / Self Care | Payer: Medicare Other | Source: Ambulatory Visit

## 2012-11-25 DIAGNOSIS — Z1231 Encounter for screening mammogram for malignant neoplasm of breast: Secondary | ICD-10-CM

## 2012-12-12 ENCOUNTER — Encounter: Payer: Self-pay | Admitting: Nurse Practitioner

## 2012-12-12 ENCOUNTER — Non-Acute Institutional Stay (SKILLED_NURSING_FACILITY): Payer: Medicare Other | Admitting: Nurse Practitioner

## 2012-12-12 DIAGNOSIS — F0391 Unspecified dementia with behavioral disturbance: Secondary | ICD-10-CM

## 2012-12-12 DIAGNOSIS — F03918 Unspecified dementia, unspecified severity, with other behavioral disturbance: Secondary | ICD-10-CM

## 2012-12-12 DIAGNOSIS — F29 Unspecified psychosis not due to a substance or known physiological condition: Secondary | ICD-10-CM

## 2012-12-12 DIAGNOSIS — F411 Generalized anxiety disorder: Secondary | ICD-10-CM

## 2012-12-12 DIAGNOSIS — I10 Essential (primary) hypertension: Secondary | ICD-10-CM

## 2012-12-12 DIAGNOSIS — N289 Disorder of kidney and ureter, unspecified: Secondary | ICD-10-CM

## 2012-12-12 DIAGNOSIS — E559 Vitamin D deficiency, unspecified: Secondary | ICD-10-CM

## 2012-12-12 NOTE — Assessment & Plan Note (Addendum)
Currently on namenda and aricept. Recently started on neudexta by psych services due to crying without reason

## 2012-12-12 NOTE — Assessment & Plan Note (Signed)
Currently on Lamictal and Risperdal will check labs

## 2012-12-12 NOTE — Assessment & Plan Note (Signed)
On zoloft- appears stable at this time

## 2012-12-12 NOTE — Assessment & Plan Note (Signed)
Will add vit D 1000 units daily

## 2012-12-12 NOTE — Assessment & Plan Note (Signed)
Patient is stable; continue current regimen. Will monitor and make changes as necessary.  

## 2012-12-12 NOTE — Assessment & Plan Note (Signed)
Will follow up cmp

## 2012-12-12 NOTE — Progress Notes (Signed)
Patient ID: Amanda [name], female   DOB: 10-29-1945, 67 y.o.   MRN: [id number]  Chief Complaint: medical management of chronic conditions   HPI:  67 year old female with a PMH of renal insufficieny, dementia with behaviors, HTN and psychosis who is a long term resident of [facility] [place]. pt doing well in the current setting. pt is alert and pleasantly confused. does have verbal outburst with agitation that have gotten worse- psych now seeing pt and recently Rx nuedexta due to unexplained episode of crying out.  Staff currently without concerns  Review of Systems:  Review of Systems  Unable to perform ROS: dementia    Medications: Patient's Medications  New Prescriptions   No medications on file  Previous Medications   ATORVASTATIN (LIPITOR) 10 MG TABLET    Take 10 mg by mouth daily.   CLONIDINE (CATAPRES) 0.1 MG TABLET    Take 0.1 mg by mouth 2 (two) times daily.   DEXTROMETHORPHAN-QUINIDINE (NUEDEXTA) 20-10 MG CAPS    Take by mouth daily.   DONEPEZIL (ARICEPT) 10 MG TABLET    Take 10 mg by mouth at bedtime.   HYDROCODONE-ACETAMINOPHEN (NORCO) 5-325 MG PER TABLET    1-2 tabs po q6 hours prn pain   LABETALOL (NORMODYNE) 300 MG TABLET    Take 300 mg by mouth 2 (two) times daily.   LAMOTRIGINE (LAMICTAL) 100 MG TABLET    Take 100 mg by mouth daily.   LORATADINE (CLARITIN) 10 MG TABLET    Take 10 mg by mouth daily.   MEMANTINE HCL ER (NAMENDA XR) 28 MG CP24    Take 28 mg by mouth daily.   MULTIPLE MINERALS-VITAMINS (CALCIUM & VIT D3 BONE HEALTH PO)    Take by mouth daily.   RISPERIDONE (RISPERDAL) 0.25 MG TABLET    Take 0.25 mg by mouth 2 (two) times daily.   SERTRALINE (ZOLOFT) 50 MG TABLET    Take 50 mg by mouth daily.  Modified Medications   No medications on file  Discontinued Medications   HALOPERIDOL (HALDOL) 1 MG TABLET    Take 1 tablet (1 mg total) by mouth 2 (two) times daily as needed.     Physical Exam: Physical Exam  Nursing note and vitals reviewed. Constitutional: She  is well-developed, well-nourished, and in no distress. No distress.  HENT:  Head: Normocephalic and atraumatic.  Cardiovascular: Normal rate, regular rhythm and normal heart sounds.   Pulmonary/Chest: Effort normal and breath sounds normal.  Abdominal: Soft. Bowel sounds are normal. She exhibits no distension. There is no tenderness.  Musculoskeletal: Normal range of motion.  Neurological: She is alert.  Skin: Skin is warm and dry. She is not diaphoretic.     Filed Vitals:   12/12/12 1558  BP: 142/76  Pulse: 60  Temp: 97.8 F (36.6 C)  Resp: 18  Weight: 119 lb (53.978 kg)      Labs reviewed: Basic Metabolic Panel:  Recent Labs  40/98/11 1042 05/18/12 0939 05/31/12 0851  NA 138 139 144  K 3.6 3.5 4.1  CL 104 101 106  CO2 20 23  --   GLUCOSE 104* 162* 86  BUN 26* 17 39*  CREATININE 1.28* 1.11* 1.70*  CALCIUM 9.9 9.6  --     Liver Function Tests:  Recent Labs  05/17/12 1042  AST 33  ALT 24  ALKPHOS 84  BILITOT 0.5  PROT 7.8  ALBUMIN 4.5    CBC:  Recent Labs  05/17/12 1042 05/18/12 0939 05/31/12 0851  WBC  12.0* 8.3  --   NEUTROABS 10.1*  --   --   HGB 12.7 12.1 11.6*  HCT 38.3 35.9* 34.0*  MCV 91.0 89.3  --   PLT 160 180  --    09/14/12 CBC NO Diff (Complete Blood Count)       Result: 09/14/2012 2:26 PM    ( Status: F )       C     WBC  7.9        4.0-10.5  K/uL  SLN       RBC  3.80     L  3.87-5.11  MIL/uL  SLN       Hemoglobin  11.2     L  12.0-15.0  g/dL  SLN       Hematocrit  33.4     L  36.0-46.0  %  SLN       MCV  87.9        78.0-100.0  fL  SLN       MCH  29.5        26.0-34.0  pg  SLN       MCHC  33.5        30.0-36.0  g/dL  SLN       RDW  16.1        11.5-15.5  %  SLN       Platelet Count  294        150-400  K/uL  SLN      Comprehensive Metabolic Panel       Result: 09/14/2012 2:41 PM    ( Status: F )            Sodium  142        135-145  mEq/L  SLN       Potassium  3.9        3.5-5.3  mEq/L  SLN       Chloride  110        96-112   mEq/L  SLN       CO2  22        19-32  mEq/L  SLN       Glucose  95        70-99  mg/dL  SLN       BUN  39     H  6-23  mg/dL  SLN       Creatinine  1.44     H  0.50-1.10  mg/dL  SLN       Bilirubin, Total  0.2     L  0.3-1.2  mg/dL  SLN       Alkaline Phosphatase  64        39-117  U/L  SLN       AST/SGOT  33        0-37  U/L  SLN       ALT/SGPT  39     H  0-35  U/L  SLN       Total Protein  6.9        6.0-8.3  g/dL  SLN       Albumin  4.1        3.5-5.2  g/dL  SLN       Calcium  9.3        8.4-10.5     09/26/12: Lipid Profile         Cholesterol  141  0-200  mg/dL  SLN  C     Triglyceride  84        <150  mg/dL  SLN       HDL Cholesterol  44        >39  mg/dL  SLN       Total Chol/HDL Ratio  3.2         Ratio  SLN       VLDL Cholesterol (Calc)  17        0-40  mg/dL  SLN       LDL Cholesterol (Calc)  80        0-99  mg/dL  SLN  C    TSH, Ultrasensitive          TSH  1.363        0.350-4.500  uIU/mL  SLN      Vitamin D (25-Hydroxy)              Vitamin D (25-Hydroxy)  28     L  30-89  ng/mL  SLN  C         Assessment/Plan Dementia, unspecified, with behavioral disturbance Currently on namenda and aricept. Recently started on neudexta by psych services due to crying without reason  Renal insufficiency Will follow up cmp  Anxiety state, unspecified On zoloft- appears stable at this time  Unspecified psychosis Currently on Lamictal and Risperdal will check labs  Essential hypertension, benign Patient is stable; continue current regimen. Will monitor and make changes as necessary.   Unspecified vitamin D deficiency Will add vit D 1000 units daily

## 2012-12-29 ENCOUNTER — Encounter: Payer: Self-pay | Admitting: Nurse Practitioner

## 2012-12-29 ENCOUNTER — Non-Acute Institutional Stay (SKILLED_NURSING_FACILITY): Payer: Medicare Other | Admitting: Nurse Practitioner

## 2012-12-29 DIAGNOSIS — R5383 Other fatigue: Secondary | ICD-10-CM

## 2012-12-29 DIAGNOSIS — F29 Unspecified psychosis not due to a substance or known physiological condition: Secondary | ICD-10-CM

## 2012-12-29 DIAGNOSIS — R5381 Other malaise: Secondary | ICD-10-CM

## 2012-12-29 NOTE — Progress Notes (Signed)
Patient ID: ARCELIA PALS, female   DOB: February 23, 1946, 67 y.o.   MRN: [id number]  Nursing Home Location:  [facility]   Place of Service: SNF (31)   Chief Complaint: AV: change in condition  HPI:  67 year old female with a PMH of renal insufficieny, dementia with behaviors, HTN and psychosis who is a long term resident of [facility] who is being followed by psych services due to psychosis. She has been started on neudexa and lamictal (which was recently increased). Staff reports now all she does is sleep. Does not come out of her room and when placed in the wheelchair she is sleeping. Staff also notices increased confusion and more combative behaviors since these medications were added. Pt is very lethargic on exam and unable to participate in ROS and does not allow me to do a PE due to being combative and agitated.    Review of Systems:   unable due to ROS Medications: Patient's Medications  New Prescriptions   No medications on file  Previous Medications   ATORVASTATIN (LIPITOR) 10 MG TABLET    Take 10 mg by mouth daily.   CLONIDINE (CATAPRES) 0.1 MG TABLET    Take 0.1 mg by mouth 2 (two) times daily.   DEXTROMETHORPHAN-QUINIDINE (NUEDEXTA) 20-10 MG CAPS    Take by mouth daily.   DONEPEZIL (ARICEPT) 10 MG TABLET    Take 10 mg by mouth at bedtime.   HYDROCODONE-ACETAMINOPHEN (NORCO) 5-325 MG PER TABLET    1-2 tabs po q6 hours prn pain   LABETALOL (NORMODYNE) 300 MG TABLET    Take 300 mg by mouth 2 (two) times daily.   LAMOTRIGINE (LAMICTAL) 100 MG TABLET    Take 100 mg by mouth daily.   LORATADINE (CLARITIN) 10 MG TABLET    Take 10 mg by mouth daily.   MEMANTINE HCL ER (NAMENDA XR) 28 MG CP24    Take 28 mg by mouth daily.   MULTIPLE MINERALS-VITAMINS (CALCIUM & VIT D3 BONE HEALTH PO)    Take by mouth daily.   RISPERIDONE (RISPERDAL) 0.25 MG TABLET    Take 0.25 mg by mouth 2 (two) times daily.   SERTRALINE (ZOLOFT) 50 MG TABLET    Take 50 mg by mouth daily.  Modified  Medications   No medications on file  Discontinued Medications   No medications on file     Physical Exam:  Filed Vitals:   12/29/12 1631  BP: 112/77  Pulse: 71  Temp: 97.5 F (36.4 C)  Resp: 16   Unable to do PE due to pts agitation.   Assessment/Plan Unspecified psychosis Worse with recent additions of medication. Will titrate Lamictal off and discont neudexa- staff educated to monitor behaviors and to notify of any adverse changes    Fatigue and malaise  most likely due to recent increase in psych medications however will get labs at this time to rule out other causes.  UA C&S, CBC with diff, and BMP

## 2013-01-05 NOTE — Assessment & Plan Note (Signed)
Worse with recent additions of medication. Will titrate Lamictal off and discont neudexa- staff educated to monitor behaviors and to notify of any adverse changes

## 2013-01-06 ENCOUNTER — Encounter: Payer: Self-pay | Admitting: Internal Medicine

## 2013-01-31 ENCOUNTER — Encounter: Payer: Self-pay | Admitting: Nurse Practitioner

## 2013-01-31 ENCOUNTER — Non-Acute Institutional Stay (SKILLED_NURSING_FACILITY): Payer: Medicare Other | Admitting: Nurse Practitioner

## 2013-01-31 DIAGNOSIS — F0391 Unspecified dementia with behavioral disturbance: Secondary | ICD-10-CM

## 2013-01-31 DIAGNOSIS — F411 Generalized anxiety disorder: Secondary | ICD-10-CM

## 2013-01-31 DIAGNOSIS — N289 Disorder of kidney and ureter, unspecified: Secondary | ICD-10-CM

## 2013-01-31 DIAGNOSIS — I10 Essential (primary) hypertension: Secondary | ICD-10-CM

## 2013-01-31 NOTE — Progress Notes (Signed)
Patient ID: Amanda [name], female   DOB: Apr 06, 1946, 67 y.o.   MRN: [id number]  Nursing Home Location:  [facility]   Place of Service: SNF (31)   Chief Complaint: medical management of chronic conditions   HPI:  67 year old female with a PMH of renal insufficieny, dementia with behaviors, HTN and psychosis who is a long term resident of [facility] who is being seen today for routine follow up. Pt with recent medication adjustments in psych meds (lamictal and neudexa were stopped) . She is now more alert, cooperative and coherent.  Staff reports pt is doing well and has no concerns at this time.  Review of Systems:  Review of Systems  Unable to perform ROS: dementia     Medications: Patient's Medications  New Prescriptions   No medications on file  Previous Medications   ATORVASTATIN (LIPITOR) 10 MG TABLET    Take 10 mg by mouth daily.   CLONIDINE (CATAPRES) 0.1 MG TABLET    Take 0.1 mg by mouth 2 (two) times daily.   DONEPEZIL (ARICEPT) 10 MG TABLET    Take 10 mg by mouth at bedtime.   HYDROCODONE-ACETAMINOPHEN (NORCO) 5-325 MG PER TABLET    1-2 tabs po q6 hours prn pain   LABETALOL (NORMODYNE) 300 MG TABLET    Take 300 mg by mouth 2 (two) times daily.   LORATADINE (CLARITIN) 10 MG TABLET    Take 10 mg by mouth daily.   MEMANTINE HCL ER (NAMENDA XR) 28 MG CP24    Take 28 mg by mouth daily.   MULTIPLE MINERALS-VITAMINS (CALCIUM & VIT D3 BONE HEALTH PO)    Take by mouth daily.   RISPERIDONE (RISPERDAL) 0.25 MG TABLET    Take 0.25 mg by mouth at bedtime.    SERTRALINE (ZOLOFT) 50 MG TABLET    Take 50 mg by mouth daily.  Modified Medications   No medications on file  Discontinued Medications   DEXTROMETHORPHAN-QUINIDINE (NUEDEXTA) 20-10 MG CAPS    Take by mouth daily.   LAMOTRIGINE (LAMICTAL) 100 MG TABLET    Take 100 mg by mouth daily.     Physical Exam:  Filed Vitals:   01/31/13 1715  BP: 180/87  Pulse: 60  Temp: 97.8 F (36.6 C)  Resp: 18  Weight:  126 lb (57.153 kg)    Physical Exam  Constitutional: She is well-developed, well-nourished, and in no distress. No distress.  HENT:  Head: Normocephalic and atraumatic.  Cardiovascular: Normal rate, regular rhythm and normal heart sounds.   Pulmonary/Chest: Effort normal and breath sounds normal.  Abdominal: Soft. Bowel sounds are normal. She exhibits no distension. There is no tenderness.  Musculoskeletal: Normal range of motion. She exhibits no edema and no tenderness.  Neurological: She is alert.  Skin: Skin is warm and dry. She is not diaphoretic.  Psychiatric:  pleasantly confused      Labs reviewed:       Result: 12/17/2012 11:10 PM    ( Status: F )       C     Sodium  137        135-145  mEq/L  SLN       Potassium  3.8        3.5-5.3  mEq/L  SLN       Chloride  105        96-112  mEq/L  SLN       CO2  24  19-32  mEq/L  SLN       Glucose  87        70-99  mg/dL  SLN       BUN  29     H  6-23  mg/dL  SLN       Creatinine  1.37     H  0.50-1.10  mg/dL  SLN       Calcium  9.6        8.4-10.5  mg/dL  SLN       Est GFR, African American  45     L   mL/min  SLN       Est GFR, NonAfrican American  39     L   mL/min  SLN  C    CBC NO Diff (Complete Blood Count)       Result: 12/17/2012 11:01 PM    ( Status: F )            WBC  5.9        4.0-10.5  K/uL  SLN       RBC  3.75     L  3.87-5.11  MIL/uL  SLN       Hemoglobin  11.4     L  12.0-15.0  g/dL  SLN       Hematocrit  33.3     L  36.0-46.0  %  SLN       MCV  88.8        78.0-100.0  fL  SLN       MCH  30.4        26.0-34.0  pg  SLN       MCHC  34.2        30.0-36.0  g/dL  SLN       RDW  21.3        11.5-15.5  %  SLN       Platelet Count  264        150-400  K/uL  SLN      Hemoglobin A1C       Result: 12/17/2012 11:56 PM    ( Status: F )            Hemoglobin A1C  5.9     H  <5.7  %  SLN  C     Estimated Average Glucose  123     H  <117  mg/dL  SLN    CBC NO Diff (Complete Blood Count)       Result: 12/30/2012 1:57 PM     ( Status: F )            WBC  7.9        4.0-10.5  K/uL  SLN       RBC  4.09        3.87-5.11  MIL/uL  SLN       Hemoglobin  12.4        12.0-15.0  g/dL  SLN       Hematocrit  36.2        36.0-46.0  %  SLN       MCV  88.5        78.0-100.0  fL  SLN       MCH  30.3        26.0-34.0  pg  SLN       MCHC  34.3        30.0-36.0  g/dL  SLN       RDW  13.6        11.5-15.5  %  SLN       Platelet Count  233        150-400  K/uL  SLN      Basic Metabolic Panel       Result: 12/30/2012 3:48 PM    ( Status: F )            Sodium  139        135-145  mEq/L  SLN       Potassium  3.9        3.5-5.3  mEq/L  SLN       Chloride  102        96-112  mEq/L  SLN       CO2  28        19-32  mEq/L  SLN       Glucose  93        70-99  mg/dL  SLN       BUN  33     H  6-23  mg/dL  SLN       Creatinine  1.61     H  0.50-1.10  mg/dL  SLN       Calcium  9.5        COLONY COUNT:                      NO GROWTH  FINAL REPORT                      NO GROWTH  Assessment/Plan   1.   Anxiety state, unspecified 300.00     Pt currently taking risperdal q hs and stable- min outburst per staff- pt generally in a good mood   2.   Dementia, unspecified, with behavioral disturbance 294.21     Stable on aricept and namenda   3.   Essential hypertension, benign 401.1     Elevation in blood pressures occasionally - will add PRN clonidine 0.1 for sbp over 180  VS q am   4.   Renal insufficiency 593.9  Patient is stable; continue current regimen.

## 2013-02-27 ENCOUNTER — Encounter: Payer: Self-pay | Admitting: Nurse Practitioner

## 2013-02-27 ENCOUNTER — Non-Acute Institutional Stay (SKILLED_NURSING_FACILITY): Payer: Medicare Other | Admitting: Nurse Practitioner

## 2013-02-27 DIAGNOSIS — F411 Generalized anxiety disorder: Secondary | ICD-10-CM

## 2013-02-27 DIAGNOSIS — I10 Essential (primary) hypertension: Secondary | ICD-10-CM

## 2013-02-27 DIAGNOSIS — F0391 Unspecified dementia with behavioral disturbance: Secondary | ICD-10-CM

## 2013-02-27 DIAGNOSIS — E785 Hyperlipidemia, unspecified: Secondary | ICD-10-CM | POA: Insufficient documentation

## 2013-02-27 NOTE — Progress Notes (Signed)
Patient ID: Amanda [name], female   DOB: 01-18-1946, 67 y.o.   MRN: [id number]  Nursing Home Location:  [facility]   Place of Service: SNF (31)  Chief Complaint  Patient presents with  . Medical Managment of Chronic Issues    HPI:  67 year old female with a PMH of renal insufficieny, dementia with behaviors, HTN and psychosis who is a long term resident of [facility] who is being seen today for routine follow up. Husband with pt during today's visit and has no concerns.  Assessment of ongoing issues: Hypertension- uncontrolled- on reviewing VS sbp elevated above 180 frequently- taking clonidine 0.1 mg q 12 hours and labetalol 300 mg BID  pt with PRN clonidine 0.1 mg  Dementia- stable-- pt conts to be stable on medications-- more alert, cooperative and coherent. Anxiety- stable on zoloft Hyperlipidemia- stable on lipitor Staff reports pt is doing well and has no concerns at this time.    Review of Systems:  Unable to preform ROS due to dementia  Medications: Patient's Medications  New Prescriptions   No medications on file  Previous Medications   ATORVASTATIN (LIPITOR) 10 MG TABLET    Take 10 mg by mouth daily.   CLONIDINE (CATAPRES) 0.1 MG TABLET    Take 0.1 mg by mouth 2 (two) times daily.   DONEPEZIL (ARICEPT) 10 MG TABLET    Take 10 mg by mouth at bedtime.   HYDROCODONE-ACETAMINOPHEN (NORCO) 5-325 MG PER TABLET    1-2 tabs po q6 hours prn pain   LABETALOL (NORMODYNE) 300 MG TABLET    Take 300 mg by mouth 2 (two) times daily.   LORATADINE (CLARITIN) 10 MG TABLET    Take 10 mg by mouth daily.   MEMANTINE HCL ER (NAMENDA XR) 28 MG CP24    Take 28 mg by mouth daily.   MULTIPLE MINERALS-VITAMINS (CALCIUM & VIT D3 BONE HEALTH PO)    Take by mouth daily.   RISPERIDONE (RISPERDAL) 0.25 MG TABLET    Take 0.25 mg by mouth at bedtime.    SERTRALINE (ZOLOFT) 50 MG TABLET    Take 50 mg by mouth daily.  Modified Medications   No medications on file  Discontinued  Medications   No medications on file     Physical Exam:  Filed Vitals:   02/27/13 0943  BP: 153/86  Pulse: 62  Temp: 97.7 F (36.5 C)  Resp: 20  Weight: 126 lb (57.153 kg)    Physical Exam  Constitutional: She is well-developed, well-nourished, and in no distress. No distress.  HENT:  Head: Normocephalic and atraumatic.  Mouth/Throat: Oropharynx is clear and moist. No oropharyngeal exudate.  Eyes: Conjunctivae and EOM are normal. Pupils are equal, round, and reactive to light.  Neck: Normal range of motion. Neck supple. No thyromegaly present.  Cardiovascular: Normal rate, regular rhythm and normal heart sounds.   Pulmonary/Chest: Effort normal and breath sounds normal. No respiratory distress.  Abdominal: Soft. Bowel sounds are normal. She exhibits no distension.  Musculoskeletal: She exhibits no edema and no tenderness.  Self propels in WC; no pain on tenderness noted when joints examined    Neurological: She is alert.  Skin: Skin is warm and dry. She is not diaphoretic.      Labs reviewed/Significant Diagnostic Results: Lipid Profile       Result: 02/17/2013 4:23 PM    ( Status: F )            Cholesterol  147  0-200  mg/dL  SLN  C     Triglyceride  67        <150  mg/dL  SLN       HDL Cholesterol  42        >39  mg/dL  SLN       Total Chol/HDL Ratio  3.5         Ratio  SLN       VLDL Cholesterol (Calc)  13        0-40  mg/dL  SLN       LDL Cholesterol (Calc)  92        0-99  mg/dL  SLN     CBC NO Diff (Complete Blood Count)       Result: 12/30/2012 1:57 PM    ( Status: F )            WBC  7.9        4.0-10.5  K/uL  SLN       RBC  4.09        3.87-5.11  MIL/uL  SLN       Hemoglobin  12.4        12.0-15.0  g/dL  SLN       Hematocrit  36.2        36.0-46.0  %  SLN       MCV  88.5        78.0-100.0  fL  SLN       MCH  30.3        26.0-34.0  pg  SLN       MCHC  34.3        30.0-36.0  g/dL  SLN       RDW  08.6        11.5-15.5  %  SLN       Platelet Count  233         150-400  K/uL  SLN      Basic Metabolic Panel       Result: 12/30/2012 3:48 PM    ( Status: F )            Sodium  139        135-145  mEq/L  SLN       Potassium  3.9        3.5-5.3  mEq/L  SLN       Chloride  102        96-112  mEq/L  SLN       CO2  28        19-32  mEq/L  SLN       Glucose  93        70-99  mg/dL  SLN       BUN  33     H  6-23  mg/dL  SLN       Creatinine  1.61     H  0.50-1.10  mg/dL  SLN       Calcium  9.5        8.4-10.5  mg/dL  SLN         Assessment/Plan  1.   Dementia, unspecified, with behavioral disturbance 294.21   Patients dementia is stable; expect a slow decline due to disease progression- continue current regimen. Will monitor and make changes as necessary.   2.   Anxiety state, unspecified 300.00   Stable on current medication   3.   Essential hypertension,  benign 401.1   Worse- will increase clonidine to 0.1 mg TID and have staff monitor bp q shift   4.   Other and unspecified hyperlipidemia stable; no side effects noted from medications. Will cont current plan of care.

## 2013-03-08 ENCOUNTER — Other Ambulatory Visit: Payer: Self-pay | Admitting: Geriatric Medicine

## 2013-03-08 MED ORDER — HYDROCODONE-ACETAMINOPHEN 5-325 MG PO TABS: ORAL_TABLET | ORAL | Status: DC

## 2013-03-14 ENCOUNTER — Encounter: Payer: Self-pay | Admitting: Nurse Practitioner

## 2013-03-14 ENCOUNTER — Non-Acute Institutional Stay (SKILLED_NURSING_FACILITY): Payer: Medicare Other | Admitting: Nurse Practitioner

## 2013-03-14 DIAGNOSIS — I10 Essential (primary) hypertension: Secondary | ICD-10-CM

## 2013-03-14 NOTE — Progress Notes (Signed)
Patient ID: Amanda [name], female   DOB: 09/06/1945, 67 y.o.   MRN: [id number]  Nursing Home Location:  [facility]   Place of Service: SNF (31)  Chief Complaint  Patient presents with  . Acute Visit    HPI:  67 year old female with hx of htn, dementia, renal insuffiencey, and psychosis was seen today due to elevation of blood pressure. At last visit pts blood was elevated and clonidine was increase however blood pressure remains high. Pt with advanced dementia and unable to provide history but no headaches, facial droop or slurred speech.  Review of Systems:   DATA OBTAINED: from patient, nurse, medical record GENERAL: Feels well no fevers, fatigue, appetite changes EYES: No eye pain, redness, discharge EARS: No tinnitus NOSE: No congestion, drainage or bleeding RESPIRATORY: No cough, wheezing, SOB CARDIAC: No chest pain, palpitations, lower extremity edema  NEUROLOGIC: Awake, alert, appropriate to situation, No change in mental status. Moves all four, no focal deficits PSYCHIATRIC: No overt anxiety or sadness. Sleeps well. No behavior issue.  AMBULATION:  Self propels    Medications: Patient's Medications  New Prescriptions   No medications on file  Previous Medications   ATORVASTATIN (LIPITOR) 10 MG TABLET    Take 10 mg by mouth daily.   CLONIDINE (CATAPRES) 0.1 MG TABLET    Take 0.1 mg by mouth 3 (three) times daily.    DONEPEZIL (ARICEPT) 10 MG TABLET    Take 10 mg by mouth at bedtime.   HYDROCODONE-ACETAMINOPHEN (NORCO) 5-325 MG PER TABLET    1-2 tabs po q6 hours prn pain   LABETALOL (NORMODYNE) 300 MG TABLET    Take 300 mg by mouth 2 (two) times daily.   LORATADINE (CLARITIN) 10 MG TABLET    Take 10 mg by mouth daily.   MEMANTINE HCL ER (NAMENDA XR) 28 MG CP24    Take 28 mg by mouth daily.   MULTIPLE MINERALS-VITAMINS (CALCIUM & VIT D3 BONE HEALTH PO)    Take by mouth daily.   RISPERIDONE (RISPERDAL) 0.25 MG TABLET    Take 0.25 mg by mouth at  bedtime.    SERTRALINE (ZOLOFT) 50 MG TABLET    Take 50 mg by mouth daily.  Modified Medications   No medications on file  Discontinued Medications   No medications on file     Physical Exam:  Filed Vitals:   03/14/13 1516  BP: 192/97  Pulse: 70  Temp: 98.2 F (36.8 C)  Resp: 18    GENERAL APPEARANCE: Alert, conversant. Appropriately groomed. No acute distress.  SKIN: No diaphoresis rash, or wounds HEAD: Normocephalic, atraumatic   EYES: Conjunctiva/lids clear. Pupils round, reactive. EOMs intact.  EARS: External exam WNL, canals clear. Hearing grossly normal.  NOSE: No deformity or discharge.  MOUTH/THROAT: Lips w/o lesions. Mouth and throat normal. Tongue moist, w/o lesion.  NECK: No thyroid tenderness, enlargement or nodule  RESPIRATORY: Breathing is even, unlabored. Lung sounds are clear   CARDIOVASCULAR: Heart RRR no murmurs, rubs or gallops. No peripheral edema.  ARTERIAL: radial pulse 2+ GASTROINTESTINAL: Abdomen is soft, non-tender, not distended w/ normal bowel sounds. MUSCULOSKELETAL: No abnormal joints or musculature NEUROLOGIC: Oriented to self. Cranial nerves 2-12 grossly intact. Moves all extremities no tremor. PSYCHIATRIC: Mood and affect appropriate to situation, no behavioral issues  Labs reviewed/Significant Diagnostic Results: Lipid Profile  Result: 02/17/2013 4:23 PM ( Status: F )  Cholesterol 147 0-200 mg/dL SLN C  Triglyceride 67 <150 mg/dL SLN  HDL Cholesterol 42 >39  mg/dL SLN  Total Chol/HDL Ratio 3.5 Ratio SLN  VLDL Cholesterol (Calc) 13 1-61 mg/dL SLN  LDL Cholesterol (Calc) 92 0-96 mg/dL SLN  CBC NO Diff (Complete Blood Count)  Result: 12/30/2012 1:57 PM ( Status: F )  WBC 7.9 4.0-10.5 K/uL SLN  RBC 4.09 3.87-5.11 MIL/uL SLN  Hemoglobin 12.4 12.0-15.0 g/dL SLN  Hematocrit 04.5 40.9-81.1 % SLN  MCV 88.5 78.0-100.0 fL SLN  MCH 30.3 26.0-34.0 pg SLN  MCHC 34.3 30.0-36.0 g/dL SLN  RDW 91.4 78.2-95.6 % SLN  Platelet Count 233 150-400 K/uL SLN   Basic Metabolic Panel  Result: 12/30/2012 3:48 PM ( Status: F )  Sodium 139 135-145 mEq/L SLN  Potassium 3.9 3.5-5.3 mEq/L SLN  Chloride 102 96-112 mEq/L SLN  CO2 28 19-32 mEq/L SLN  Glucose 93 70-99 mg/dL SLN  BUN 33 H 2-13 mg/dL SLN  Creatinine 0.86 H 0.50-1.10 mg/dL SLN  Calcium 9.5 5.7-84.6 mg/dL SLN      Assessment/Plan Hypertension- cont PRNs and current medications will add Norvasc 10 mg daily and cont to monitor bps q shift

## 2013-03-21 ENCOUNTER — Other Ambulatory Visit: Payer: Self-pay | Admitting: Geriatric Medicine

## 2013-03-27 ENCOUNTER — Non-Acute Institutional Stay (SKILLED_NURSING_FACILITY): Payer: Medicare Other | Admitting: Nurse Practitioner

## 2013-03-27 DIAGNOSIS — I10 Essential (primary) hypertension: Secondary | ICD-10-CM

## 2013-03-27 DIAGNOSIS — F0391 Unspecified dementia with behavioral disturbance: Secondary | ICD-10-CM

## 2013-03-27 DIAGNOSIS — F411 Generalized anxiety disorder: Secondary | ICD-10-CM

## 2013-03-27 DIAGNOSIS — N289 Disorder of kidney and ureter, unspecified: Secondary | ICD-10-CM

## 2013-03-27 DIAGNOSIS — E785 Hyperlipidemia, unspecified: Secondary | ICD-10-CM

## 2013-03-27 NOTE — Progress Notes (Signed)
Patient ID: Amanda [name], female   DOB: Mar 10, 1946, 67 y.o.   MRN: [id number]  Nursing Home Location:  [facility]   Place of Service: SNF (31)  Chief Complaint  Patient presents with  . Medical Managment of Chronic Issues    HPI:  67 year old female with a PMH of renal insufficieny, dementia with behaviors, HTN and psychosis who is a long term resident of [facility] who is being seen today for routine follow up. Pt was recently seen due to worsening blood pressure and started on norvasc. Staff without any concerns today and pt without complaints Assessment of ongoing issues:  Hypertension- improved-taking clonidine 0.1 mg q 12 hours and labetalol 300 mg BID and norvasc 10 mg daily  pt with PRN clonidine 0.1 mg  Dementia- stable-- pt conts to be stable on medications-- more alert, cooperative and coherent.  Anxiety- stable on zoloft  Hyperlipidemia- stable on lipitor  Staff reports pt is doing well and has no concerns at this time.    Review of Systems:  Data difficult to obtain due to pts dementia- pt reports she is fine today and does not offer any other information.  Medications: Patient's Medications  New Prescriptions   No medications on file  Previous Medications   AMLODIPINE (NORVASC) 10 MG TABLET    Take 10 mg by mouth daily.   ATORVASTATIN (LIPITOR) 10 MG TABLET    Take 10 mg by mouth daily.   CLONIDINE (CATAPRES) 0.1 MG TABLET    Take 0.1 mg by mouth 3 (three) times daily.    DONEPEZIL (ARICEPT) 10 MG TABLET    Take 10 mg by mouth at bedtime.   HYDROCODONE-ACETAMINOPHEN (NORCO) 5-325 MG PER TABLET    1-2 tabs po q6 hours prn pain   LABETALOL (NORMODYNE) 300 MG TABLET    Take 300 mg by mouth 2 (two) times daily.   LORATADINE (CLARITIN) 10 MG TABLET    Take 10 mg by mouth daily.   MEMANTINE HCL ER (NAMENDA XR) 28 MG CP24    Take 28 mg by mouth daily.   MULTIPLE MINERALS-VITAMINS (CALCIUM & VIT D3 BONE HEALTH PO)    Take by mouth daily.   RISPERIDONE  (RISPERDAL) 0.25 MG TABLET    Take 0.25 mg by mouth at bedtime.    SERTRALINE (ZOLOFT) 50 MG TABLET    Take 50 mg by mouth daily.  Modified Medications   No medications on file  Discontinued Medications   No medications on file     Physical Exam:  Filed Vitals:   03/27/13 1225  BP: 146/72  Pulse: 62  Temp: 98.5 F (36.9 C)  Resp: 18    GENERAL APPEARANCE: Alert, Appropriately groomed. No acute distress.  SKIN: No diaphoresis rash, or wounds RESPIRATORY: Breathing is even, unlabored. Lung sounds are clear   CARDIOVASCULAR: Heart RRR no murmurs, rubs or gallops. No peripheral edema.  ARTERIAL: radial pulse 2+   GASTROINTESTINAL: Abdomen is soft, non-tender, not distended w/ normal bowel sounds. GENITOURINARY: Bladder non tender, not distended  MUSCULOSKELETAL: No abnormal joints or musculature NEUROLOGIC: Oriented to self only  PSYCHIATRIC: Mood and affect appropriate to situation, no behavioral issues noted pt easily redirected   Labs reviewed/Significant Diagnostic Results: Lipid Profile       Result: 02/17/2013 4:23 PM    ( Status: F )            Cholesterol  147        0-200  mg/dL  SLN  C     Triglyceride  67        <150  mg/dL  SLN       HDL Cholesterol  42        >39  mg/dL  SLN       Total Chol/HDL Ratio  3.5         Ratio  SLN       VLDL Cholesterol (Calc)  13        0-40  mg/dL  SLN       LDL Cholesterol (Calc)  92        0-99  mg/dL  SLN  C    Result: 04/23/7828 1:57 PM ( Status: F )  WBC 7.9 4.0-10.5 K/uL SLN  RBC 4.09 3.87-5.11 MIL/uL SLN  Hemoglobin 12.4 12.0-15.0 g/dL SLN  Hematocrit 56.2 13.0-86.5 % SLN  MCV 88.5 78.0-100.0 fL SLN  MCH 30.3 26.0-34.0 pg SLN  MCHC 34.3 30.0-36.0 g/dL SLN  RDW 78.4 69.6-29.5 % SLN  Platelet Count 233 150-400 K/uL SLN  Basic Metabolic Panel  Result: 12/30/2012 3:48 PM ( Status: F )  Sodium 139 135-145 mEq/L SLN  Potassium 3.9 3.5-5.3 mEq/L SLN  Chloride 102 96-112 mEq/L SLN  CO2 28 19-32 mEq/L SLN  Glucose 93 70-99 mg/dL  SLN  BUN 33 H 2-84 mg/dL SLN  Creatinine 1.32 H 0.50-1.10 mg/dL SLN  Calcium 9.5 4.4-01.0 mg/dL SLN         Assessment/Plan 1. Essential hypertension, benign Improved with starting norvasc; no side effects noted from medication  2. Dementia, unspecified, with behavioral disturbance Stable on current medicatins  3. Anxiety state, unspecified Stable no noted increase in behaviors from staff   4. Other and unspecified hyperlipidemia Stable recent lipid panel at goal  5. Renal insufficiency  will follow up bmp

## 2013-04-05 ENCOUNTER — Other Ambulatory Visit: Payer: Self-pay | Admitting: *Deleted

## 2013-04-05 MED ORDER — AMBULATORY NON FORMULARY MEDICATION: Status: DC

## 2013-04-11 ENCOUNTER — Other Ambulatory Visit: Payer: Self-pay | Admitting: *Deleted

## 2013-04-11 MED ORDER — AMBULATORY NON FORMULARY MEDICATION: Status: DC

## 2013-04-26 ENCOUNTER — Encounter: Payer: Self-pay | Admitting: Internal Medicine

## 2013-04-26 ENCOUNTER — Non-Acute Institutional Stay (SKILLED_NURSING_FACILITY): Payer: Medicare Other | Admitting: Internal Medicine

## 2013-04-26 DIAGNOSIS — H6122 Impacted cerumen, left ear: Secondary | ICD-10-CM

## 2013-04-26 DIAGNOSIS — H6120 Impacted cerumen, unspecified ear: Secondary | ICD-10-CM

## 2013-04-26 NOTE — Progress Notes (Signed)
MRN: [id number] Name: Amanda [name]  Sex: female Age: 67 y.o. DOB: 1945-11-28  PSC #: Sonny Dandy Facility/Room: 117A Level Of Care: SNF Provider: Merrilee Seashore D Emergency Contacts: Extended Emergency Contact Information Primary Emergency Contact: Berman,[name] Address: [address]          [place], [place] [address] [place] Home Phone: [phone] Mobile Phone: [phone] Relation: Spouse Secondary Emergency Contact: Romeo [place] Mobile Phone: [phone] Relation: Son     Allergies: Review of patient's allergies indicates no known allergies.  Chief Complaint  Patient presents with  . Acute Visit    HPI: Patient is 67 y.o. female who the nurses asked me to see for wax in her L ear.   Past Medical History  Diagnosis Date  . Hypertension   . Stroke ~ 2003    "slight memory loss" (05/18/2012)  . Gout     "?feet" (05/18/2012)  . Dementia     "alzheimer's" (05/18/2012)  . Incontinence of urine   . Wrist fracture, bilateral 05/17/2012    "fell down steps" (05/18/2012)    Past Surgical History  Procedure Laterality Date  . Vaginal hysterectomy        Medication List       This list is accurate as of: 04/26/13 10:09 PM.  Always use your most recent med list.               AMBULATORY NON FORMULARY MEDICATION  - Lorazepam 0.5mg  gel  - Sig: Apply 0.5mg  topically every 12 hours as needed for anxiety     amLODipine 10 MG tablet  Commonly known as:  NORVASC  Take 10 mg by mouth daily.     atorvastatin 10 MG tablet  Commonly known as:  LIPITOR  Take 10 mg by mouth daily.     CALCIUM & VIT D3 BONE HEALTH PO  Take by mouth daily.     cloNIDine 0.1 MG tablet  Commonly known as:  CATAPRES  Take 0.1 mg by mouth 3 (three) times daily.     donepezil 10 MG tablet  Commonly known as:  ARICEPT  Take 10 mg by mouth at bedtime.     HYDROcodone-acetaminophen 5-325 MG per tablet  Commonly known as:   NORCO  1-2 tabs po q6 hours prn pain     labetalol 300 MG tablet  Commonly known as:  NORMODYNE  Take 300 mg by mouth 2 (two) times daily.     loratadine 10 MG tablet  Commonly known as:  CLARITIN  Take 10 mg by mouth daily.     NAMENDA XR 28 MG Cp24  Generic drug:  Memantine HCl ER  Take 28 mg by mouth daily.     risperiDONE 0.25 MG tablet  Commonly known as:  RISPERDAL  Take 0.25 mg by mouth at bedtime.     sertraline 50 MG tablet  Commonly known as:  ZOLOFT  Take 50 mg by mouth daily.        No orders of the defined types were placed in this encounter.     There is no immunization history on file for this patient.  History  Substance Use Topics  . Smoking status: Current Some Day Smoker    Types: Cigarettes  . Smokeless tobacco: Never Used     Comment: 05/18/2012 "used to smoke like a fish; now smokes 1-2 cigarettes/wk; has smoked 30-40 years"  . Alcohol Use: Yes     Comment: 05/18/2012 "probably drank 3  beers/wk til maybe 2 yr ago"    Family history is noncontributory    Review of Systems  DATA OBTAINED: from patient, nurse, GENERAL: Feels well no fevers, fatigue SKIN: No itching, rash or wounds EYES: No eye pain, redness, discharge EARS: No earache, tinnitus, some decrease in hearing NOSE: No congestion, drainage or bleeding  MOUTH/THROAT: No mouth or tooth pain, No sore throat, No difficulty chewing or swallowing    Filed Vitals:   04/26/13 1605  BP: 161/98  Pulse: 78  Temp: 97.8 F (36.6 C)  Resp: 20    Physical Exam  GENERAL APPEARANCE: Alert, conversant. Appropriately groomed. No acute distress.  SKIN: No diaphoresis rash HEAD: Normocephalic, atraumatic  EYES: Conjunctiva/lids clear. Pupils round, reactive. EOMs intact.  EARS: External exam WNL, L canal with visible wax; Hearing grossly normal.  NOSE: No deformity or discharge.  MOUTH/THROAT: Lips w/o lesions.   RESPIRATORY: Breathing is even, unlabored. Lung sounds are clear    CARDIOVASCULAR: Heart RRR no murmurs, rubs or gallops. No peripheral edema.  MUSCULOSKELETAL: No abnormal joints or musculature. PSYCHIATRIC: Mood and affect appropriate to situation, no behavioral issues  Patient Active Problem List   Diagnosis Date Noted  . Other and unspecified hyperlipidemia 02/27/2013  . Unspecified psychosis 12/12/2012  . Anxiety state, unspecified 12/12/2012  . Unspecified vitamin D deficiency 12/12/2012  . Wrist fracture, bilateral 05/17/2012  . Fall 05/17/2012  . Renal insufficiency 05/17/2012  . Leukocytosis 05/17/2012  . Fracture of medial wall of orbit 05/17/2012  . Essential hypertension, benign   . Dementia, unspecified, with behavioral disturbance      Procedure - a large piece of wax was removed manually from pt's ear canal per ADAMD; pt tolerated well    Assessment and Plan  CERUMEN IMPACTION LEFT EAR - large piece was removed manually;orderd debrox twice a day  Margit Hanks, MD

## 2013-05-01 ENCOUNTER — Encounter: Payer: Self-pay | Admitting: Nurse Practitioner

## 2013-05-01 ENCOUNTER — Non-Acute Institutional Stay (SKILLED_NURSING_FACILITY): Payer: Medicare Other | Admitting: Nurse Practitioner

## 2013-05-01 DIAGNOSIS — E559 Vitamin D deficiency, unspecified: Secondary | ICD-10-CM

## 2013-05-01 DIAGNOSIS — E785 Hyperlipidemia, unspecified: Secondary | ICD-10-CM

## 2013-05-01 DIAGNOSIS — I10 Essential (primary) hypertension: Secondary | ICD-10-CM

## 2013-05-01 DIAGNOSIS — N289 Disorder of kidney and ureter, unspecified: Secondary | ICD-10-CM

## 2013-05-01 DIAGNOSIS — F0391 Unspecified dementia with behavioral disturbance: Secondary | ICD-10-CM

## 2013-05-01 DIAGNOSIS — F411 Generalized anxiety disorder: Secondary | ICD-10-CM

## 2013-05-01 NOTE — Progress Notes (Signed)
Patient ID: Amanda [name], female   DOB: [birth year], 67 y.o.   MRN: [id number]  Nursing Home Location:  [facility]   Place of Service: SNF (31)  Chief Complaint  Patient presents with  . Medical Managment of Chronic Issues    HPI:  67 year old female with a PMH of renal insufficieny, dementia with behaviors, HTN and psychosis who is a long term resident of [facility] who is being seen today for routine follow up. Staff without any concerns today and pt without complaints  Assessment of ongoing issues:  Hypertension- improved however still with increase BP; infrequent BP on review of nursing notes-taking clonidine 0.1 mg q 12 hours and labetalol 300 mg BID and norvasc 10 mg daily pt with PRN clonidine 0.1 mg  Dementia- stable-- pt conts to be stable on medications-- more alert, cooperative and coherent.  Anxiety- stable on zoloft; takes Risperdal 0.25mg  qhs Hyperlipidemia- stable on lipitor   Review of Systems:  Unable to obtain due to dementia; pt denies SOB, chest pain, changes or problems in bowel or bladder; denies pain   Medications: Patient's Medications  New Prescriptions   No medications on file  Previous Medications   AMBULATORY NON FORMULARY MEDICATION    Lorazepam 0.5mg  gel Sig: Apply 0.5mg  topically every 12 hours as needed for anxiety   AMLODIPINE (NORVASC) 10 MG TABLET    Take 10 mg by mouth daily.   ATORVASTATIN (LIPITOR) 10 MG TABLET    Take 10 mg by mouth daily.   CLONIDINE (CATAPRES) 0.1 MG TABLET    Take 0.1 mg by mouth 3 (three) times daily.    DONEPEZIL (ARICEPT) 10 MG TABLET    Take 10 mg by mouth at bedtime.   HYDROCODONE-ACETAMINOPHEN (NORCO) 5-325 MG PER TABLET    1-2 tabs po q6 hours prn pain   LABETALOL (NORMODYNE) 300 MG TABLET    Take 300 mg by mouth 2 (two) times daily.   LORATADINE (CLARITIN) 10 MG TABLET    Take 10 mg by mouth daily.   MEMANTINE HCL ER (NAMENDA XR) 28 MG CP24    Take 28 mg by mouth daily.   MULTIPLE  MINERALS-VITAMINS (CALCIUM & VIT D3 BONE HEALTH PO)    Take by mouth daily.   RISPERIDONE (RISPERDAL) 0.25 MG TABLET    Take 0.25 mg by mouth at bedtime.    SERTRALINE (ZOLOFT) 50 MG TABLET    Take 50 mg by mouth daily.  Modified Medications   No medications on file  Discontinued Medications   No medications on file     Physical Exam:  Filed Vitals:   05/01/13 1225  BP: 140/72  Pulse: 70  Temp: 98.6 F (37 C)  Resp: 18    GENERAL APPEARANCE: Alert, conversant. Appropriately groomed. No acute distress.  SKIN: No diaphoresis rash, or wounds HEAD: Normocephalic, atraumatic  EYES: Conjunctiva/lids clear. Pupils round, reactive. EOMs intact.  EARS: External exam WNL NOSE: No deformity or discharge.  MOUTH/THROAT: Lips w/o lesions. Mouth and throat normal. Tongue moist, w/o lesion.  NECK: No thyroid tenderness, enlargement or nodule   RESPIRATORY: Breathing is even, unlabored. Lung sounds are clear  CARDIOVASCULAR: Heart RRR no murmurs, rubs or gallops. No peripheral edema.  ARTERIAL: radial pulse 2+  GASTROINTESTINAL: Abdomen is soft, non-tender, not distended w/ normal bowel sounds. GENITOURINARY: Bladder non tender, not distended  MUSCULOSKELETAL: No abnormal joints or musculature  NEUROLOGIC: Oriented to self only  PSYCHIATRIC: Mood and affect appropriate to situation, no behavioral  issues noted pt easily redirected    Labs reviewed/Significant Diagnostic Results: Lipid Profile  Result: 02/17/2013 4:23 PM ( Status: F )  Cholesterol 147 0-200 mg/dL SLN C  Triglyceride 67 <150 mg/dL SLN  HDL Cholesterol 42 >39 mg/dL SLN  Total Chol/HDL Ratio 3.5 Ratio SLN  VLDL Cholesterol (Calc) 13 2-95 mg/dL SLN  LDL Cholesterol (Calc) 92 6-21 mg/dL SLN C  Result: 10/15/6576 1:57 PM ( Status: F )  WBC 7.9 4.0-10.5 K/uL SLN  RBC 4.09 3.87-5.11 MIL/uL SLN  Hemoglobin 12.4 12.0-15.0 g/dL SLN  Hematocrit 46.9 62.9-52.8 % SLN  MCV 88.5 78.0-100.0 fL SLN  MCH 30.3 26.0-34.0 pg SLN  MCHC  34.3 30.0-36.0 g/dL SLN  RDW 41.3 24.4-01.0 % SLN  Platelet Count 233 150-400 K/uL SLN  Basic Metabolic Panel  Result: 12/30/2012 3:48 PM ( Status: F )  Sodium 139 135-145 mEq/L SLN  Potassium 3.9 3.5-5.3 mEq/L SLN  Chloride 102 96-112 mEq/L SLN  CO2 28 19-32 mEq/L SLN  Glucose 93 70-99 mg/dL SLN  BUN 33 H 2-72 mg/dL SLN  Creatinine 5.36 H 0.50-1.10 mg/dL SLN  Calcium 9.5 6.4-40.3 mg/dL SLN   Lipid Profile       Result: 02/17/2013 4:23 PM    ( Status: F )            Cholesterol  147        0-200  mg/dL  SLN  C     Triglyceride  67        <150  mg/dL  SLN       HDL Cholesterol  42        >39  mg/dL  SLN       Total Chol/HDL Ratio  3.5         Ratio  SLN       VLDL Cholesterol (Calc)  13        0-40  mg/dL  SLN       LDL Cholesterol (Calc)  92     BMP with Estimated GFR       Result: 04/04/2013 11:33 AM    ( Status: F )            Sodium  141        135-145  mEq/L  SLN       Potassium  3.7        3.5-5.3  mEq/L  SLN       Chloride  105        96-112  mEq/L  SLN       CO2  24        19-32  mEq/L  SLN       Glucose  81        70-99  mg/dL  SLN       BUN  31     H  6-23  mg/dL  SLN       Creatinine  1.34     H  0.50-1.10  mg/dL  SLN       Calcium  9.4        8.4-10.5  mg/dL  SLN       Est GFR, African American  47     L   mL/min  SLN       Est GFR, NonAfrican American  40     L   mL/min  SLN  C        Assessment/Plan  1. Essential hypertension, benign  Improved with starting  norvasc however still elevated; ordered daily BP   2. Dementia, unspecified, with behavioral disturbance  Stable on current medicatins   3. Anxiety state, unspecified  Stable no noted increase in behaviors from staff; on risperdal for behaviors; will decrease 0.25 mg to 1/2 tablet to attempt dose reduction; will also get A1c due to being on risperdal   4. Other and unspecified hyperlipidemia  Stable and tolerating medications  5. Renal insufficiency  Kidney function stable  6. Vid D Deficiency Will  follow up VIt D level

## 2013-05-23 ENCOUNTER — Non-Acute Institutional Stay (SKILLED_NURSING_FACILITY): Payer: Medicare Other | Admitting: Nurse Practitioner

## 2013-05-23 ENCOUNTER — Encounter: Payer: Self-pay | Admitting: Nurse Practitioner

## 2013-05-23 DIAGNOSIS — I10 Essential (primary) hypertension: Secondary | ICD-10-CM

## 2013-05-23 DIAGNOSIS — F411 Generalized anxiety disorder: Secondary | ICD-10-CM

## 2013-05-23 DIAGNOSIS — E559 Vitamin D deficiency, unspecified: Secondary | ICD-10-CM

## 2013-05-23 DIAGNOSIS — F0391 Unspecified dementia with behavioral disturbance: Secondary | ICD-10-CM

## 2013-05-23 NOTE — Progress Notes (Signed)
Patient ID: Amanda [name], female   DOB: April 15, 1946, 67 y.o.   MRN: [id number]   PCP: Katy Apo, MD   No Known Allergies  Chief Complaint  Patient presents with  . Medical Managment of Chronic Issues    HPI:  67 year old female with a PMH of renal insufficieny, dementia with behaviors, HTN and psychosis who is a long term resident of [facility] who is being seen today for routine follow up.  Staff without any concerns today and pt without complaints  Assessment of ongoing issues:  Hypertension- improved; taking clonidine 0.1 mg q 12 hours and labetalol 300 mg BID and norvasc 10 mg daily pt with PRN clonidine 0.1 mg  Dementia- stable-- pt conts to be stable on medications-- more alert, cooperative and coherent.  Anxiety- stable on zoloft; takes Risperdal 0.125mg  qhs which has recently been reduced; no changes in behavior or anxiety noted Hyperlipidemia- stable on lipitor      Review of Systems:  Unable to obtain due to dementia; husband at bedside- no concerns    Past Medical History  Diagnosis Date  . Hypertension   . Stroke ~ 2003    "slight memory loss" (05/18/2012)  . Gout     "?feet" (05/18/2012)  . Dementia     "alzheimer's" (05/18/2012)  . Incontinence of urine   . Wrist fracture, bilateral 05/17/2012    "fell down steps" (05/18/2012)   Past Surgical History  Procedure Laterality Date  . Vaginal hysterectomy     Social History:   reports that she has been smoking Cigarettes.  She has been smoking about 0.00 packs per day. She has never used smokeless tobacco. She reports that she drinks alcohol. She reports that she uses illicit drugs (Marijuana).  Family History  Problem Relation Age of Onset  . Dementia Mother   . Dementia Father   . Dementia Sister   . Dementia Brother     Medications: Patient's Medications  New Prescriptions   No medications on file  Previous Medications   AMBULATORY NON FORMULARY MEDICATION    Lorazepam 0.5mg  gel Sig:  Apply 0.5mg  topically every 12 hours as needed for anxiety   AMLODIPINE (NORVASC) 10 MG TABLET    Take 10 mg by mouth daily.   ATORVASTATIN (LIPITOR) 10 MG TABLET    Take 10 mg by mouth daily.   CLONIDINE (CATAPRES) 0.1 MG TABLET    Take 0.1 mg by mouth 3 (three) times daily.    DONEPEZIL (ARICEPT) 10 MG TABLET    Take 10 mg by mouth at bedtime.   HYDROCODONE-ACETAMINOPHEN (NORCO) 5-325 MG PER TABLET    1-2 tabs po q6 hours prn pain   LABETALOL (NORMODYNE) 300 MG TABLET    Take 300 mg by mouth 2 (two) times daily.   LORATADINE (CLARITIN) 10 MG TABLET    Take 10 mg by mouth daily.   MEMANTINE HCL ER (NAMENDA XR) 28 MG CP24    Take 28 mg by mouth daily.   MULTIPLE MINERALS-VITAMINS (CALCIUM & VIT D3 BONE HEALTH PO)    Take by mouth daily.   RISPERIDONE (RISPERDAL) 0.25 MG TABLET    Take 0.125 mg by mouth at bedtime.    SERTRALINE (ZOLOFT) 50 MG TABLET    Take 50 mg by mouth daily.  Modified Medications   No medications on file  Discontinued Medications   No medications on file     Physical Exam:  Filed Vitals:   05/23/13 1316  BP: 140/77  Pulse:  70  Temp: 97.8 F (36.6 C)  Resp: 18   GENERAL APPEARANCE: Alert, conversant. Appropriately groomed. No acute distress.  SKIN: No diaphoresis rash, or wounds  HEENT:unremarkable NECK: No thyroid tenderness, enlargement or nodule  RESPIRATORY: Breathing is even, unlabored. Lung sounds are clear  CARDIOVASCULAR: Heart RRR no murmurs, rubs or gallops. No peripheral edema.  ARTERIAL: radial pulse 2+  GASTROINTESTINAL: Abdomen is soft, non-tender, not distended w/ normal bowel sounds.  GENITOURINARY: Bladder non tender, not distended  MUSCULOSKELETAL: No abnormal joints or musculature  NEUROLOGIC: Oriented to self only  PSYCHIATRIC: Mood and affect appropriate to situation, no behavioral issues noted pt easily redirected     Labs reviewed: Basic Metabolic Panel:  Recent Labs  16/10/96 0851  NA 144  K 4.1  CL 106  GLUCOSE 86  BUN  39*  CREATININE 1.70*   Liver Function Tests: No results found for this basename: AST, ALT, ALKPHOS, BILITOT, PROT, ALBUMIN,  in the last 8760 hours No results found for this basename: LIPASE, AMYLASE,  in the last 8760 hours No results found for this basename: AMMONIA,  in the last 8760 hours CBC:  Recent Labs  05/31/12 0851  HGB 11.6*  HCT 34.0*   Lipid Profile  Result: 02/17/2013 4:23 PM ( Status: F )  Cholesterol 147 0-200 mg/dL SLN C  Triglyceride 67 <150 mg/dL SLN  HDL Cholesterol 42 >39 mg/dL SLN  Total Chol/HDL Ratio 3.5 Ratio SLN  VLDL Cholesterol (Calc) 13 0-45 mg/dL SLN  LDL Cholesterol (Calc) 92 4-09 mg/dL SLN C  Result: 03/20/9146 1:57 PM ( Status: F )  WBC 7.9 4.0-10.5 K/uL SLN  RBC 4.09 3.87-5.11 MIL/uL SLN  Hemoglobin 12.4 12.0-15.0 g/dL SLN  Hematocrit 82.9 56.2-13.0 % SLN  MCV 88.5 78.0-100.0 fL SLN  MCH 30.3 26.0-34.0 pg SLN  MCHC 34.3 30.0-36.0 g/dL SLN  RDW 86.5 78.4-69.6 % SLN  Platelet Count 233 150-400 K/uL SLN  Basic Metabolic Panel  Result: 12/30/2012 3:48 PM ( Status: F )  Sodium 139 135-145 mEq/L SLN  Potassium 3.9 3.5-5.3 mEq/L SLN  Chloride 102 96-112 mEq/L SLN  CO2 28 19-32 mEq/L SLN  Glucose 93 70-99 mg/dL SLN  BUN 33 H 2-95 mg/dL SLN  Creatinine 2.84 H 0.50-1.10 mg/dL SLN  Calcium 9.5 1.3-24.4 mg/dL SLN  Lipid Profile  Result: 02/17/2013 4:23 PM ( Status: F )  Cholesterol 147 0-200 mg/dL SLN C  Triglyceride 67 <150 mg/dL SLN  HDL Cholesterol 42 >39 mg/dL SLN  Total Chol/HDL Ratio 3.5 Ratio SLN  VLDL Cholesterol (Calc) 13 0-10 mg/dL SLN  LDL Cholesterol (Calc) 92  BMP with Estimated GFR  Result: 04/04/2013 11:33 AM ( Status: F )  Sodium 141 135-145 mEq/L SLN  Potassium 3.7 3.5-5.3 mEq/L SLN  Chloride 105 96-112 mEq/L SLN  CO2 24 19-32 mEq/L SLN  Glucose 81 70-99 mg/dL SLN  BUN 31 H 2-72 mg/dL SLN  Creatinine 5.36 H 0.50-1.10 mg/dL SLN  Calcium 9.4 6.4-40.3 mg/dL SLN  Est GFR, African American 47 L mL/min SLN  Est GFR, NonAfrican  American 40 L mL/min SLN C   Hemoglobin A1C       Result: 05/02/2013 6:07 PM    ( Status: F )            Hemoglobin A1C  6.2     H  <5.7  %  SLN  C     Estimated Average Glucose  131     H  <117  mg/dL  SLN  Vitamin D (25-Hydroxy)       Result: 05/03/2013 1:00 AM    ( Status: F )            Vitamin D (25-Hydroxy)  45       Assessment/Plan  1. Essential hypertension, benign Improved; blood pressure at goal sbp <140   2. Dementia, unspecified, with behavioral disturbance Patient is stable; continue current regimen. Will monitor and make changes as necessary.  3. Anxiety state, unspecified Stable; dose reduction of Risperdal last month; no increase in behaviors noted; will dc Risperdal and cont to monitor   4. Unspecified vitamin D deficiency Patient is stable; continue current regimen. Will monitor and make changes as necessary.

## 2013-06-27 ENCOUNTER — Non-Acute Institutional Stay (SKILLED_NURSING_FACILITY): Payer: Medicare Other | Admitting: Nurse Practitioner

## 2013-06-27 DIAGNOSIS — E785 Hyperlipidemia, unspecified: Secondary | ICD-10-CM

## 2013-06-27 DIAGNOSIS — F411 Generalized anxiety disorder: Secondary | ICD-10-CM

## 2013-06-27 DIAGNOSIS — F0391 Unspecified dementia with behavioral disturbance: Secondary | ICD-10-CM

## 2013-06-27 DIAGNOSIS — I10 Essential (primary) hypertension: Secondary | ICD-10-CM

## 2013-06-27 DIAGNOSIS — E559 Vitamin D deficiency, unspecified: Secondary | ICD-10-CM

## 2013-06-27 NOTE — Progress Notes (Signed)
Patient ID: Amanda [name], female   DOB: 09/15/1945, 67 y.o.   MRN: [id number] Nursing Home Location:  [facility]   Place of Service: SNF (31)  PCP: Katy Apo, MD  No Known Allergies  Chief Complaint  Patient presents with  . Medical Managment of Chronic Issues    HPI:  67 year old female with a PMH of renal insufficieny, dementia with behaviors, HTN and psychosis who is a long term resident of [facility] who is being seen today for routine follow up.  Staff without any concerns today and pt without complaints  Assessment of ongoing issues:  Hypertension- improved; taking clonidine 0.1 mg q 12 hours and labetalol 300 mg BID and norvasc 10 mg daily pt with PRN clonidine 0.1 mg  Dementia- stable-- pt conts to be stable on medications-- more alert, cooperative and coherent.  Anxiety- stable on zoloft; takes Risperdal has been stopped; no changes in behavior or anxiety noted  Hyperlipidemia- stable on lipitor    Review of Systems:  Unable to obtain  Past Medical History  Diagnosis Date  . Hypertension   . Stroke ~ 2003    "slight memory loss" (05/18/2012)  . Gout     "?feet" (05/18/2012)  . Dementia     "alzheimer's" (05/18/2012)  . Incontinence of urine   . Wrist fracture, bilateral 05/17/2012    "fell down steps" (05/18/2012)   Past Surgical History  Procedure Laterality Date  . Vaginal hysterectomy     Social History:   reports that she has been smoking Cigarettes.  She has been smoking about 0.00 packs per day. She has never used smokeless tobacco. She reports that she drinks alcohol. She reports that she uses illicit drugs (Marijuana).  Family History  Problem Relation Age of Onset  . Dementia Mother   . Dementia Father   . Dementia Sister   . Dementia Brother     Medications: Patient's Medications  New Prescriptions   No medications on file  Previous Medications   AMBULATORY NON FORMULARY MEDICATION    Lorazepam 0.5mg  gel Sig: Apply  0.5mg  topically every 12 hours as needed for anxiety   AMLODIPINE (NORVASC) 10 MG TABLET    Take 10 mg by mouth daily.   ATORVASTATIN (LIPITOR) 10 MG TABLET    Take 10 mg by mouth daily.   CLONIDINE (CATAPRES) 0.1 MG TABLET    Take 0.1 mg by mouth 3 (three) times daily.    DONEPEZIL (ARICEPT) 10 MG TABLET    Take 10 mg by mouth at bedtime.   HYDROCODONE-ACETAMINOPHEN (NORCO) 5-325 MG PER TABLET    1-2 tabs po q6 hours prn pain   LABETALOL (NORMODYNE) 300 MG TABLET    Take 300 mg by mouth 2 (two) times daily.   LORATADINE (CLARITIN) 10 MG TABLET    Take 10 mg by mouth daily.   MEMANTINE HCL ER (NAMENDA XR) 28 MG CP24    Take 28 mg by mouth daily.   MULTIPLE MINERALS-VITAMINS (CALCIUM & VIT D3 BONE HEALTH PO)    Take by mouth daily.   SERTRALINE (ZOLOFT) 50 MG TABLET    Take 50 mg by mouth daily.  Modified Medications   No medications on file  Discontinued Medications   RISPERIDONE (RISPERDAL) 0.25 MG TABLET    Take 0.125 mg by mouth at bedtime.      Physical Exam: Physical Exam  Constitutional: She is well-developed, well-nourished, and in no distress. No distress.  HENT:  Head: Normocephalic and  atraumatic.  Eyes: Conjunctivae and EOM are normal. Pupils are equal, round, and reactive to light.  Cardiovascular: Normal rate, regular rhythm and normal heart sounds.   Pulmonary/Chest: Effort normal and breath sounds normal. No respiratory distress.  Abdominal: Soft. Bowel sounds are normal. She exhibits no distension.  Musculoskeletal: She exhibits no edema and no tenderness.  Self propels in WC; no pain on tenderness noted when joints examined    Neurological: She is alert.  Skin: Skin is warm and dry. She is not diaphoretic.     Filed Vitals:   06/27/13 1353  BP: 132/79  Pulse: 79  Resp: 20  Weight: 128 lb (58.06 kg)      Labs reviewed: Result: 02/17/2013 4:23 PM ( Status: F )  Cholesterol 147 0-200 mg/dL SLN C  Triglyceride 67 <150 mg/dL SLN  HDL Cholesterol 42 >39 mg/dL  SLN  Total Chol/HDL Ratio 3.5 Ratio SLN  VLDL Cholesterol (Calc) 13 9-60 mg/dL SLN  LDL Cholesterol (Calc) 92 4-54 mg/dL SLN C  Result: 0/98/1191 1:57 PM ( Status: F )  WBC 7.9 4.0-10.5 K/uL SLN  RBC 4.09 3.87-5.11 MIL/uL SLN  Hemoglobin 12.4 12.0-15.0 g/dL SLN  Hematocrit 47.8 29.5-62.1 % SLN  MCV 88.5 78.0-100.0 fL SLN  MCH 30.3 26.0-34.0 pg SLN  MCHC 34.3 30.0-36.0 g/dL SLN  RDW 30.8 65.7-84.6 % SLN  Platelet Count 233 150-400 K/uL SLN  Basic Metabolic Panel  Result: 12/30/2012 3:48 PM ( Status: F )  Sodium 139 135-145 mEq/L SLN  Potassium 3.9 3.5-5.3 mEq/L SLN  Chloride 102 96-112 mEq/L SLN  CO2 28 19-32 mEq/L SLN  Glucose 93 70-99 mg/dL SLN  BUN 33 H 9-62 mg/dL SLN  Creatinine 9.52 H 0.50-1.10 mg/dL SLN  Calcium 9.5 8.4-13.2 mg/dL SLN  Lipid Profile  Result: 02/17/2013 4:23 PM ( Status: F )  Cholesterol 147 0-200 mg/dL SLN C  Triglyceride 67 <150 mg/dL SLN  HDL Cholesterol 42 >39 mg/dL SLN  Total Chol/HDL Ratio 3.5 Ratio SLN  VLDL Cholesterol (Calc) 13 4-40 mg/dL SLN  LDL Cholesterol (Calc) 92  BMP with Estimated GFR  Result: 04/04/2013 11:33 AM ( Status: F )  Sodium 141 135-145 mEq/L SLN  Potassium 3.7 3.5-5.3 mEq/L SLN  Chloride 105 96-112 mEq/L SLN  CO2 24 19-32 mEq/L SLN  Glucose 81 70-99 mg/dL SLN  BUN 31 H 1-02 mg/dL SLN  Creatinine 7.25 H 0.50-1.10 mg/dL SLN  Calcium 9.4 3.6-64.4 mg/dL SLN  Est GFR, African American 47 L mL/min SLN  Est GFR, NonAfrican American 40 L mL/min SLN C   Hemoglobin A1C  Result: 05/02/2013 6:07 PM ( Status: F )  Hemoglobin A1C 6.2 H <5.7 % SLN C  Estimated Average Glucose 131 H <117 mg/dL SLN  Vitamin D (03-[id number])  Result: 05/03/2013 1:00 AM ( Status: F )  Vitamin D (25-Hydroxy) 45  Lipid Profile    Result: 06/19/2013 4:04 PM   ( Status: F )       Cholesterol 148     0-200 mg/dL SLN C Triglyceride 638   H <150 mg/dL SLN   HDL Cholesterol 36   L >39 mg/dL SLN   Total Chol/HDL Ratio 4.1      Ratio SLN   VLDL Cholesterol  (Calc) 57   H 0-40 mg/dL SLN   LDL Cholesterol (Calc) 55     0-99 mg/dL SLN C Liver Profile    Result: 06/19/2013 4:04 PM   ( Status: F )       Bilirubin, Total 0.3  0.3-1.2 mg/dL SLN   Bilirubin, Direct <0.1     0.0-0.3 mg/dL SLN   Indirect Bilirubin NOT CALC     0.0-0.9 mg/dL SLN   Alkaline Phosphatase 77     39-117 U/L SLN   AST/SGOT 19     0-37 U/L SLN   ALT/SGPT 12     0-35 U/L SLN   Total Protein 7.3     6.0-8.3 g/dL SLN   Albumin 4.3     Assessment/Plan 1. Essential hypertension, benign Patient is stable; continue current regimen. Will monitor and make changes as necessary.  2. Dementia, unspecified, with behavioral disturbance Remains stable on current medications  3. Anxiety state, unspecified Stable; off Risperdal for 1 month; no behaviors noted; will cont to monitor; next month possible attempt for dose reduction of zoloft if no increase in anxiety n0ted  4. Other and unspecified hyperlipidemia triglycerides markedly higher than lipids done less than 6 months ago; will have staff redraw making sure pt has been fasting  5. Unspecified vitamin D deficiency Stable at this time.

## 2013-07-24 ENCOUNTER — Encounter: Payer: Self-pay | Admitting: Internal Medicine

## 2013-07-24 ENCOUNTER — Non-Acute Institutional Stay (SKILLED_NURSING_FACILITY): Payer: Medicare Other | Admitting: Internal Medicine

## 2013-07-24 DIAGNOSIS — E559 Vitamin D deficiency, unspecified: Secondary | ICD-10-CM

## 2013-07-24 DIAGNOSIS — N289 Disorder of kidney and ureter, unspecified: Secondary | ICD-10-CM

## 2013-07-24 DIAGNOSIS — F0391 Unspecified dementia with behavioral disturbance: Secondary | ICD-10-CM

## 2013-07-24 DIAGNOSIS — I10 Essential (primary) hypertension: Secondary | ICD-10-CM

## 2013-07-24 DIAGNOSIS — E785 Hyperlipidemia, unspecified: Secondary | ICD-10-CM

## 2013-07-24 DIAGNOSIS — R05 Cough: Secondary | ICD-10-CM

## 2013-07-24 NOTE — Assessment & Plan Note (Signed)
Vit D 04/2013 was 45 so 1000u daily should be fine

## 2013-07-24 NOTE — Assessment & Plan Note (Signed)
Last BUN/Cr  31/1.34  03/2013;stable

## 2013-07-24 NOTE — Assessment & Plan Note (Signed)
Pt no longer on Neudexta; no behavoir problems today; continue namemda, aricept, zoloft

## 2013-07-24 NOTE — Assessment & Plan Note (Signed)
FLP 06/28/2013  HDL 35; LDL84; Pt's HbA1c 08/6107 was 6.2 so LDL is fine; LFT's fine, continue lipitor

## 2013-07-24 NOTE — Progress Notes (Signed)
MRN: [id number] Name: Amanda [name]  Sex: female Age: 67 y.o. DOB: [birth year]  PSC #: [facility] Facility/Room: 117A Level Of Care: SNF Provider: Merrilee Seashore D Emergency Contacts: Extended Emergency Contact Information Primary Emergency Contact: Neto,[name] Address: [address]          [place], [place] [address] [place] Home Phone: [phone] Mobile Phone: [phone] Relation: Spouse Secondary Emergency Contact: Romeo [place] Mobile Phone: [phone] Relation: Son  Code Status: FULL  Allergies: Review of patient's allergies indicates no known allergies.  Chief Complaint  Patient presents with  . Medical Managment of Chronic Issues    HPI: Patient is 67 y.o. female who is full time resident. Following chronic med problems as well as new cough.  Past Medical History  Diagnosis Date  . Hypertension   . Stroke ~ 2003    "slight memory loss" (05/18/2012)  . Gout     "?feet" (05/18/2012)  . Dementia     "alzheimer's" (05/18/2012)  . Incontinence of urine   . Wrist fracture, bilateral 05/17/2012    "fell down steps" (05/18/2012)    Past Surgical History  Procedure Laterality Date  . Vaginal hysterectomy        Medication List       This list is accurate as of: 07/24/13  3:11 PM.  Always use your most recent med list.               AMBULATORY NON FORMULARY MEDICATION  - Lorazepam 0.5mg  gel  - Sig: Apply 0.5mg  topically every 12 hours as needed for anxiety     amLODipine 10 MG tablet  Commonly known as:  NORVASC  Take 10 mg by mouth daily.     atorvastatin 10 MG tablet  Commonly known as:  LIPITOR  Take 10 mg by mouth daily.     CALCIUM & VIT D3 BONE HEALTH PO  Take by mouth daily.     cloNIDine 0.1 MG tablet  Commonly known as:  CATAPRES  Take 0.1 mg by mouth 3 (three) times daily.     donepezil 10 MG tablet  Commonly known as:  ARICEPT  Take 10 mg by mouth at bedtime.     HYDROcodone-acetaminophen 5-325 MG per tablet  Commonly known as:  NORCO  1-2 tabs po q6 hours prn pain     labetalol 300 MG tablet  Commonly known as:  NORMODYNE  Take 300 mg by mouth 2 (two) times daily.     loratadine 10 MG tablet  Commonly known as:  CLARITIN  Take 10 mg by mouth daily.     NAMENDA XR 28 MG Cp24  Generic drug:  Memantine HCl ER  Take 28 mg by mouth daily.     sertraline 50 MG tablet  Commonly known as:  ZOLOFT  Take 50 mg by mouth daily.        No orders of the defined types were placed in this encounter.    Immunization History  Administered Date(s) Administered  . Influenza-Unspecified 05/10/2013  . Pneumococcal-Unspecified 06/03/2012    History  Substance Use Topics  . Smoking status: Current Some Day Smoker    Types: Cigarettes  . Smokeless tobacco: Never Used     Comment: 05/18/2012 "used to smoke like a fish; now smokes 1-2 cigarettes/wk; has smoked 30-40 years"  . Alcohol Use: Yes     Comment: 05/18/2012 "probably drank 3 beers/wk til maybe 2 yr ago"    Review of Systems- UTO  from pt sec dementia; nurse reports new dry cough; no change in MS    Filed Vitals:   07/24/13 1457  BP: 121/72  Pulse: 60  Temp: 97 F (36.1 C)  Resp: 18    Physical Exam  GENERAL APPEARANCE: Alert, conversant. Appropriately groomed. No acute distress  SKIN: No diaphoresis rash, or wounds HEENT: Unremarkable RESPIRATORY: Breathing is even, unlabored. Lung sounds are clear  O2 sat at bedside Per ADAMD was 96-97 with pulse 61 CARDIOVASCULAR: Heart RRR no murmurs, rubs or gallops. No peripheral edema  GASTROINTESTINAL: Abdomen is soft, non-tender, not distended w/ normal bowel sounds.  GENITOURINARY: Bladder non tender, not distended  MUSCULOSKELETAL: No abnormal joints or musculature NEUROLOGIC: Cranial nerves 2-12 grossly intact. Dec movement RUE PSYCHIATRIC: Happily having a life in her mind, no behavioral issues  Patient Active Problem List    Diagnosis Date Noted  . Other and unspecified hyperlipidemia 02/27/2013  . Unspecified psychosis 12/12/2012  . Anxiety state, unspecified 12/12/2012  . Unspecified vitamin D deficiency 12/12/2012  . Wrist fracture, bilateral 05/17/2012  . Fall 05/17/2012  . Renal insufficiency 05/17/2012  . Leukocytosis 05/17/2012  . Fracture of medial wall of orbit 05/17/2012  . Essential hypertension, benign   . Dementia, unspecified, with behavioral disturbance     CBC    Component Value Date/Time   WBC 8.3 05/18/2012 0939   RBC 4.02 05/18/2012 0939   HGB 11.6* 05/31/2012 0851   HCT 34.0* 05/31/2012 0851   PLT 180 05/18/2012 0939   MCV 89.3 05/18/2012 0939   LYMPHSABS 1.3 05/17/2012 1042   MONOABS 0.5 05/17/2012 1042   EOSABS 0.1 05/17/2012 1042   BASOSABS 0.0 05/17/2012 1042    CMP     Component Value Date/Time   NA 144 05/31/2012 0851   K 4.1 05/31/2012 0851   CL 106 05/31/2012 0851   CO2 23 05/18/2012 0939   GLUCOSE 86 05/31/2012 0851   BUN 39* 05/31/2012 0851   CREATININE 1.70* 05/31/2012 0851   CALCIUM 9.6 05/18/2012 0939   PROT 7.8 05/17/2012 1042   ALBUMIN 4.5 05/17/2012 1042   AST 33 05/17/2012 1042   ALT 24 05/17/2012 1042   ALKPHOS 84 05/17/2012 1042   BILITOT 0.5 05/17/2012 1042   GFRNONAA 50* 05/18/2012 0939   GFRAA 58* 05/18/2012 0939    Assessment and Plan  Essential hypertension, benign Appears controlled on current regimen-will continue labetolol, clonidine, norvasc;lAST bun/cR  31/1.34  Renal insufficiency Last BUN/Cr  31/1.34  03/2013;stable  Dementia, unspecified, with behavioral disturbance Pt no longer on Neudexta; no behavoir problems today; continue namemda, aricept, zoloft  Other and unspecified hyperlipidemia FLP 06/28/2013  HDL 35; LDL84; Pt's HbA1c 08/6107 was 6.2 so LDL is fine; LFT's fine, continue lipitor  Unspecified vitamin D deficiency Vit D 04/2013 was 45 so 1000u daily should be fine  COUGH- pt did not cough during interview, exam benign, O2 sat  good, pt does not look sick -will start Robitussin 2 tsp q 6 prn  Margit Hanks, MD

## 2013-07-24 NOTE — Assessment & Plan Note (Addendum)
Appears controlled on current regimen-will continue labetolol, clonidine, norvasc;lAST bun/cR  31/1.34

## 2013-08-29 ENCOUNTER — Non-Acute Institutional Stay (SKILLED_NURSING_FACILITY): Payer: Medicare Other | Admitting: Nurse Practitioner

## 2013-08-29 ENCOUNTER — Encounter: Payer: Self-pay | Admitting: Nurse Practitioner

## 2013-08-29 DIAGNOSIS — N289 Disorder of kidney and ureter, unspecified: Secondary | ICD-10-CM

## 2013-08-29 DIAGNOSIS — E785 Hyperlipidemia, unspecified: Secondary | ICD-10-CM

## 2013-08-29 DIAGNOSIS — F0391 Unspecified dementia with behavioral disturbance: Secondary | ICD-10-CM

## 2013-08-29 DIAGNOSIS — F411 Generalized anxiety disorder: Secondary | ICD-10-CM

## 2013-08-29 DIAGNOSIS — I10 Essential (primary) hypertension: Secondary | ICD-10-CM

## 2013-08-29 DIAGNOSIS — E559 Vitamin D deficiency, unspecified: Secondary | ICD-10-CM

## 2013-08-29 DIAGNOSIS — F03918 Unspecified dementia, unspecified severity, with other behavioral disturbance: Secondary | ICD-10-CM

## 2013-08-29 NOTE — Progress Notes (Signed)
Patient ID: Amanda [name], female   DOB: 04/27/1946, 68 y.o.   MRN: [id number]    Nursing Home Location:  [facility]   Place of Service: SNF (31)  PCP: Katy Apo, MD  No Known Allergies  Chief Complaint  Patient presents with  . Medical Managment of Chronic Issues    HPI:  68 year old female with a PMH of renal insufficieny, dementia with behaviors, HTN and psychosis who is a long term resident of [facility] who is being seen today for routine follow up on chronic conditions  Staff without any concerns today and pt without complaints  Assessment of ongoing issues:  Hypertension- currently stable on current medication Dementia- stable and doing well without behaviors Anxiety- stable on zoloft Hyperlipidemia- stable on lipitor      Review of Systems:  Unable to obtain  Past Medical History  Diagnosis Date  . Hypertension   . Stroke ~ 2003    "slight memory loss" (05/18/2012)  . Gout     "?feet" (05/18/2012)  . Dementia     "alzheimer's" (05/18/2012)  . Incontinence of urine   . Wrist fracture, bilateral 05/17/2012    "fell down steps" (05/18/2012)   Past Surgical History  Procedure Laterality Date  . Vaginal hysterectomy     Social History:   reports that she has been smoking Cigarettes.  She has been smoking about 0.00 packs per day. She has never used smokeless tobacco. She reports that she drinks alcohol. She reports that she uses illicit drugs (Marijuana).  Family History  Problem Relation Age of Onset  . Dementia Mother   . Dementia Father   . Dementia Sister   . Dementia Brother     Medications: Patient's Medications  New Prescriptions   No medications on file  Previous Medications   AMBULATORY NON FORMULARY MEDICATION    Lorazepam 0.5mg  gel Sig: Apply 0.5mg  topically every 12 hours as needed for anxiety   AMLODIPINE (NORVASC) 10 MG TABLET    Take 10 mg by mouth daily.   ATORVASTATIN (LIPITOR) 10 MG TABLET    Take 10 mg by  mouth daily.   CLONIDINE (CATAPRES) 0.1 MG TABLET    Take 0.1 mg by mouth 3 (three) times daily.    DONEPEZIL (ARICEPT) 10 MG TABLET    Take 10 mg by mouth at bedtime.   HYDROCODONE-ACETAMINOPHEN (NORCO) 5-325 MG PER TABLET    1-2 tabs po q6 hours prn pain   LABETALOL (NORMODYNE) 300 MG TABLET    Take 300 mg by mouth 2 (two) times daily.   LORATADINE (CLARITIN) 10 MG TABLET    Take 10 mg by mouth daily.   MEMANTINE HCL ER (NAMENDA XR) 28 MG CP24    Take 28 mg by mouth daily.   MULTIPLE MINERALS-VITAMINS (CALCIUM & VIT D3 BONE HEALTH PO)    Take by mouth daily.   SERTRALINE (ZOLOFT) 50 MG TABLET    Take 50 mg by mouth daily.  Modified Medications   No medications on file  Discontinued Medications   No medications on file     Physical Exam: Physical Exam  Constitutional: She is well-developed, well-nourished, and in no distress. No distress.  HENT:  Head: Normocephalic and atraumatic.  Mouth/Throat: Oropharynx is clear and moist. No oropharyngeal exudate.  Eyes: Conjunctivae and EOM are normal. Pupils are equal, round, and reactive to light.  Cardiovascular: Normal rate, regular rhythm and normal heart sounds.   Pulmonary/Chest: Effort normal and breath sounds normal.  Abdominal: Soft. Bowel sounds are normal. She exhibits no distension. There is no tenderness.  Musculoskeletal: Normal range of motion. She exhibits no edema and no tenderness.  Neurological: [facility] is alert.  [facility]  Skin: Skin is warm and dry. She is not diaphoretic.  Psychiatric:  pleasantly confused      Filed Vitals:   08/29/13 1607  BP: 138/77  Pulse: 67  Temp: 97 F (36.1 C)  Resp: 20      Labs reviewed: Hemoglobin A1C  Result: 05/02/2013 6:07 PM ( Status: F )  Hemoglobin A1C 6.2 H <5.7 % SLN C  Estimated Average Glucose 131 H <117 mg/dL SLN  Vitamin D (96-[id number]-Hydroxy)  Result: 05/03/2013 1:00 AM ( Status: F )  Vitamin D (25-Hydroxy) 45  Lipid Profile  Result: 06/19/2013 4:04 PM ( Status: F )    Cholesterol 148 0-200 mg/dL SLN C  Triglyceride 119285 H <150 mg/dL SLN  HDL Cholesterol 36 L >39 mg/dL SLN  Total Chol/HDL Ratio 4.1 Ratio SLN  VLDL Cholesterol (Calc) 57 H 0-40 mg/dL SLN  LDL Cholesterol (Calc) 55 1-470-99 mg/dL SLN C  Liver Profile  Result: 06/19/2013 4:04 PM ( Status: F )  Bilirubin, Total 0.3 0.3-1.2 mg/dL SLN  Bilirubin, Direct <0.1 0.0-0.3 mg/dL SLN  Indirect Bilirubin NOT CALC 0.0-0.9 mg/dL SLN  Alkaline Phosphatase 77 39-117 U/L SLN  AST/SGOT 19 0-37 U/L SLN  ALT/SGPT 12 0-35 U/L SLN  Total Protein 7.3 6.0-8.3 g/dL SLN  Albumin 4.3      Assessment/Plan 1. Dementia, unspecified, with behavioral disturbance -does well in currentl environment; conts on namenda and aricept  2. Renal insufficiency -will follow up blood work; cbc and cmp  3. Anxiety state, unspecified -conts on zoloft  4. Unspecified vitamin D deficiency -conts vit D supplement  5. Other and unspecified hyperlipidemia -conts lipitor; LDL at goal  6. Essential hypertension, benign -has been hard to control in the past; blood pressure has been better on current medications

## 2013-10-03 ENCOUNTER — Encounter: Payer: Self-pay | Admitting: Nurse Practitioner

## 2013-10-03 ENCOUNTER — Non-Acute Institutional Stay (SKILLED_NURSING_FACILITY): Payer: Medicare Other | Admitting: Nurse Practitioner

## 2013-10-03 DIAGNOSIS — F0391 Unspecified dementia with behavioral disturbance: Secondary | ICD-10-CM

## 2013-10-03 DIAGNOSIS — F411 Generalized anxiety disorder: Secondary | ICD-10-CM

## 2013-10-03 DIAGNOSIS — F03918 Unspecified dementia, unspecified severity, with other behavioral disturbance: Secondary | ICD-10-CM

## 2013-10-03 DIAGNOSIS — I10 Essential (primary) hypertension: Secondary | ICD-10-CM

## 2013-10-03 DIAGNOSIS — E785 Hyperlipidemia, unspecified: Secondary | ICD-10-CM

## 2013-10-03 NOTE — Progress Notes (Signed)
Patient ID: Amanda [name], female   DOB: June 22, 1946, 68 y.o.   MRN: [id number]    Nursing Home Location:  [facility]   Place of Service: SNF (53)  PCP: Kandice Hams, MD  No Known Allergies  Chief Complaint  Patient presents with  . Medical Managment of Chronic Issues    HPI:  68 year old female with a PMH of renal insufficieny, dementia with behaviors, HTN and psychosis who is a long term resident of [facility] who is being seen today for routine follow up on chronic conditions; there has been no notable changes in the last month, pt doing well and Staff without any concerns today and pt without complaints  Assessment of ongoing issues:  Hypertension- currently stable on current medication  Dementia- stable and doing well without behaviors  Anxiety- stable on Zoloft; has done well with dose reduction  Hyperlipidemia- stable on lipitor   Review of Systems:  Unable to obtain  Past Medical History  Diagnosis Date  . Hypertension   . Stroke ~ 2003    "slight memory loss" (05/18/2012)  . Gout     "?feet" (05/18/2012)  . Dementia     "alzheimer's" (05/18/2012)  . Incontinence of urine   . Wrist fracture, bilateral 05/17/2012    "fell down steps" (05/18/2012)   Past Surgical History  Procedure Laterality Date  . Vaginal hysterectomy     Social History:   reports that she has been smoking Cigarettes.  She has been smoking about 0.00 packs per day. She has never used smokeless tobacco. She reports that she drinks alcohol. She reports that she uses illicit drugs (Marijuana).  Family History  Problem Relation Age of Onset  . Dementia Mother   . Dementia Father   . Dementia Sister   . Dementia Brother     Medications: Patient's Medications  New Prescriptions   No medications on file  Previous Medications   AMBULATORY NON FORMULARY MEDICATION    Lorazepam 0.51m gel Sig: Apply 0.530mtopically every 12 hours as needed for anxiety   AMLODIPINE  (NORVASC) 10 MG TABLET    Take 10 mg by mouth daily.   ATORVASTATIN (LIPITOR) 10 MG TABLET    Take 10 mg by mouth daily.   CLONIDINE (CATAPRES) 0.1 MG TABLET    Take 0.1 mg by mouth 3 (three) times daily.    DONEPEZIL (ARICEPT) 10 MG TABLET    Take 10 mg by mouth at bedtime.   HYDROCODONE-ACETAMINOPHEN (NORCO) 5-325 MG PER TABLET    1-2 tabs po q6 hours prn pain   LABETALOL (NORMODYNE) 300 MG TABLET    Take 300 mg by mouth 2 (two) times daily.   LORATADINE (CLARITIN) 10 MG TABLET    Take 10 mg by mouth daily.   MEMANTINE HCL ER (NAMENDA XR) 28 MG CP24    Take 28 mg by mouth daily.   MULTIPLE MINERALS-VITAMINS (CALCIUM & VIT D3 BONE HEALTH PO)    Take by mouth daily.   SERTRALINE (ZOLOFT) 50 MG TABLET    Take 25 mg by mouth daily.   Modified Medications   No medications on file  Discontinued Medications   No medications on file     Physical Exam:  Filed Vitals:   10/03/13 1552  BP: 131/76  Pulse: 69  Temp: 97.9 F (36.6 C)  Resp: 20    Physical Exam  Constitutional: She is well-developed, well-nourished, and in no distress.  HENT:  Head: Normocephalic and atraumatic.  Nose: Nose normal.  Mouth/Throat: Oropharynx is clear and moist. No oropharyngeal exudate.  Eyes: Conjunctivae and EOM are normal. Pupils are equal, round, and reactive to light.  Neck: Normal range of motion.  Cardiovascular: Normal rate, regular rhythm and normal heart sounds.   Pulmonary/Chest: Effort normal and breath sounds normal. No respiratory distress.  Abdominal: Soft. Bowel sounds are normal. She exhibits no distension.  Musculoskeletal: Normal range of motion. She exhibits no edema and no tenderness.  Neurological: She is alert.  In [facility]  Skin: Skin is warm and dry. She is not diaphoretic.  Psychiatric:  pleasantly confused      Labs reviewed: BMP with Estimated GFR    Result: 04/04/2013 11:33 AM   ( Status: F )       Sodium 141     135-145 mEq/L SLN   Potassium 3.7     3.5-5.3 mEq/L SLN     Chloride 105     96-112 mEq/L SLN   CO2 24     19-32 mEq/L SLN   Glucose 81     70-99 mg/dL SLN   BUN 31   H 6-23 mg/dL SLN   Creatinine 1.34   H 0.50-1.10 mg/dL SLN   Calcium 9.4     8.4-10.5 mg/dL SLN   Est GFR, African American 47   L  mL/min SLN   Est GFR, NonAfrican American 40   L  mL/min SLN  Hemoglobin A1C  Result: 05/02/2013 6:07 PM ( Status: F )  Hemoglobin A1C 6.2 H <5.7 % SLN C  Estimated Average Glucose 131 H <117 mg/dL SLN  Vitamin D (25-Hydroxy)  Result: 05/03/2013 1:00 AM ( Status: F )  Vitamin D (25-Hydroxy) 45  Lipid Profile  Result: 06/19/2013 4:04 PM ( Status: F )  Cholesterol 148 0-200 mg/dL SLN C  Triglyceride 285 H <150 mg/dL SLN  HDL Cholesterol 36 L >39 mg/dL SLN  Total Chol/HDL Ratio 4.1 Ratio SLN  VLDL Cholesterol (Calc) 57 H 0-40 mg/dL SLN  LDL Cholesterol (Calc) 55 0-99 mg/dL SLN C  Liver Profile  Result: 06/19/2013 4:04 PM ( Status: F )  Bilirubin, Total 0.3 0.3-1.2 mg/dL SLN  Bilirubin, Direct <0.1 0.0-0.3 mg/dL SLN  Indirect Bilirubin NOT CALC 0.0-0.9 mg/dL SLN  Alkaline Phosphatase 77 39-117 U/L SLN  AST/SGOT 19 0-37 U/L SLN  ALT/SGPT 12 0-35 U/L SLN  Total Protein 7.3 6.0-8.3 g/dL SLN  Albumin 4.3  CBC with Diff    Result: 08/31/2013 11:26 PM   ( Status: F )     C WBC 6.9     4.0-10.5 K/uL SLN   RBC 4.04     3.87-5.11 MIL/uL SLN   Hemoglobin 12.3     12.0-15.0 g/dL SLN   Hematocrit 36.6     36.0-46.0 % SLN   MCV 90.6     78.0-100.0 fL SLN   MCH 30.4     26.0-34.0 pg SLN   MCHC 33.6     30.0-36.0 g/dL SLN   RDW 13.0     11.5-15.5 % SLN   Platelet Count 263     150-400 K/uL SLN   Granulocyte % 52     43-77 % SLN   Absolute Gran 3.6     1.7-7.7 K/uL SLN   Lymph % 37     12-46 % SLN   Absolute Lymph 2.6     0.7-4.0 K/uL SLN   Mono % 7     3-12 % SLN   Absolute Mono  0.5     0.1-1.0 K/uL SLN   Eos % 4     0-5 % SLN   Absolute Eos 0.3     0.0-0.7 K/uL SLN   Baso % 0     0-1 % SLN   Absolute Baso 0.0     0.0-0.1 K/uL SLN   Smear  Review Criteria for review not met  SLN   Comprehensive Metabolic Panel    Result: 09/01/2013 1:23 AM   ( Status: F )       Sodium 142     135-145 mEq/L SLN   Potassium 3.9     3.5-5.3 mEq/L SLN   Chloride 105     96-112 mEq/L SLN   CO2 28     19-32 mEq/L SLN   Glucose 94     70-99 mg/dL SLN   BUN 25   H 6-23 mg/dL SLN   Creatinine 1.38   H 0.50-1.10 mg/dL SLN   Bilirubin, Total 0.4     0.3-1.2 mg/dL SLN   Alkaline Phosphatase 82     39-117 U/L SLN   AST/SGOT 20     0-37 U/L SLN   ALT/SGPT 17     0-35 U/L SLN   Total Protein 7.2     6.0-8.3 g/dL SLN   Albumin 4.1     3.5-5.2 g/dL SLN   Calcium 9.6     8.4-10.5 mg/dL SLN    Lipid Profile    Result: 09/21/2013 10:32 AM   ( Status: F )     C Cholesterol 133     0-200 mg/dL SLN C Triglyceride 161   H <150 mg/dL SLN   HDL Cholesterol 37   L >39 mg/dL SLN   Total Chol/HDL Ratio 3.6      Ratio SLN   VLDL Cholesterol (Calc) 32     0-40 mg/dL SLN   LDL Cholesterol (Calc) 64     0-99  Assessment/Plan 1. Dementia, unspecified, with behavioral disturbance -stable in current environment; no notable changes in the last month  2. Essential hypertension, benign -stable on current medications  3. Other and unspecified hyperlipidemia -LdLat goal; will cont to follow conts on lipitor  4. Anxiety state, unspecified -doing well; decrease zoloft and pt has tolerated change so far

## 2013-10-31 ENCOUNTER — Other Ambulatory Visit: Payer: Self-pay | Admitting: *Deleted

## 2013-10-31 ENCOUNTER — Non-Acute Institutional Stay (SKILLED_NURSING_FACILITY): Payer: Medicare Other | Admitting: Nurse Practitioner

## 2013-10-31 DIAGNOSIS — F29 Unspecified psychosis not due to a substance or known physiological condition: Secondary | ICD-10-CM

## 2013-10-31 DIAGNOSIS — E559 Vitamin D deficiency, unspecified: Secondary | ICD-10-CM

## 2013-10-31 DIAGNOSIS — E785 Hyperlipidemia, unspecified: Secondary | ICD-10-CM

## 2013-10-31 DIAGNOSIS — F0391 Unspecified dementia with behavioral disturbance: Secondary | ICD-10-CM

## 2013-10-31 DIAGNOSIS — I10 Essential (primary) hypertension: Secondary | ICD-10-CM

## 2013-10-31 DIAGNOSIS — F03918 Unspecified dementia, unspecified severity, with other behavioral disturbance: Secondary | ICD-10-CM

## 2013-10-31 DIAGNOSIS — F411 Generalized anxiety disorder: Secondary | ICD-10-CM

## 2013-10-31 MED ORDER — HYDROCODONE-ACETAMINOPHEN 5-325 MG PO TABS: ORAL_TABLET | ORAL | Status: DC

## 2013-10-31 NOTE — Telephone Encounter (Signed)
[facility] 

## 2013-10-31 NOTE — Progress Notes (Signed)
Patient ID: Amanda [name], female   DOB: 1946/01/11, 68 y.o.   MRN: [id number]    Nursing Home Location:  [facility]   Place of Service: SNF (46)  PCP: Kandice Hams, MD  No Known Allergies  Chief Complaint  Patient presents with  . Medical Managment of Chronic Issues    HPI:  68 year old female with a PMH of renal insufficieny, dementia with behaviors, HTN and psychosis who is a long term resident of [facility] who is being seen today for routine follow up on chronic conditions; there has been no notable changes in the last month, pt doing well and Staff without any concerns today, husband with pt during the visit and reports she has been healthy other than her memory loss. Doing well at [facility], no increase in behaviors  Assessment of ongoing issues:  Hypertension- currently stable on current medication  Dementia- stable and doing well without behaviors  Anxiety- stable on Zoloft Hyperlipidemia- stable on lipitor    Review of Systems:  Review of Systems  Unable to perform ROS: dementia     Past Medical History  Diagnosis Date  . Hypertension   . Stroke ~ 2003    "slight memory loss" (05/18/2012)  . Gout     "?feet" (05/18/2012)  . Dementia     "alzheimer's" (05/18/2012)  . Incontinence of urine   . Wrist fracture, bilateral 05/17/2012    "fell down steps" (05/18/2012)   Past Surgical History  Procedure Laterality Date  . Vaginal hysterectomy     Social History:   reports that she has been smoking Cigarettes.  She has been smoking about 0.00 packs per day. She has never used smokeless tobacco. She reports that she drinks alcohol. She reports that she uses illicit drugs (Marijuana).  Family History  Problem Relation Age of Onset  . Dementia Mother   . Dementia Father   . Dementia Sister   . Dementia Brother     Medications: Patient's Medications  New Prescriptions   No medications on file  Previous Medications   AMBULATORY NON  FORMULARY MEDICATION    Lorazepam 0.56m gel Sig: Apply 0.551mtopically every 12 hours as needed for anxiety   AMLODIPINE (NORVASC) 10 MG TABLET    Take 10 mg by mouth daily.   ATORVASTATIN (LIPITOR) 10 MG TABLET    Take 10 mg by mouth daily.   CLONIDINE (CATAPRES) 0.1 MG TABLET    Take 0.1 mg by mouth 3 (three) times daily.    DONEPEZIL (ARICEPT) 10 MG TABLET    Take 10 mg by mouth at bedtime.   HYDROCODONE-ACETAMINOPHEN (NORCO) 5-325 MG PER TABLET    Take one tablet by mouth every 6 hours as needed; Take two tablets by mouth every 6 hours as needed for pain   LABETALOL (NORMODYNE) 300 MG TABLET    Take 300 mg by mouth 2 (two) times daily.   LORATADINE (CLARITIN) 10 MG TABLET    Take 10 mg by mouth daily.   MEMANTINE HCL ER (NAMENDA XR) 28 MG CP24    Take 28 mg by mouth daily.   MULTIPLE MINERALS-VITAMINS (CALCIUM & VIT D3 BONE HEALTH PO)    Take by mouth daily.   SERTRALINE (ZOLOFT) 50 MG TABLET    Take 25 mg by mouth daily.   Modified Medications   No medications on file  Discontinued Medications   No medications on file     Physical Exam:  Filed Vitals:   10/31/13  1146  BP: 120/64  Pulse: 60  Temp: 98.2 F (36.8 C)  Resp: 18  Weight: 125 lb (56.7 kg)   Physical Exam  Constitutional: She is well-developed, well-nourished, and in no distress. No distress.  HENT:  Head: Normocephalic and atraumatic.  Right Ear: External ear normal.  Left Ear: External ear normal.  Nose: Nose normal.  Mouth/Throat: Oropharynx is clear and moist. No oropharyngeal exudate.  Eyes: Conjunctivae and EOM are normal. Pupils are equal, round, and reactive to light.  Cardiovascular: Normal rate, regular rhythm and normal heart sounds.   Pulmonary/Chest: Effort normal and breath sounds normal. No respiratory distress.  Abdominal: Soft. Bowel sounds are normal. She exhibits no distension. There is no tenderness.  Musculoskeletal: Normal range of motion. She exhibits no edema and no tenderness.    Neurological: She is alert.  In [facility]  Skin: Skin is warm and dry. She is not diaphoretic.  Psychiatric:  pleasantly confused       Labs reviewed: BMP with Estimated GFR  Result: 04/04/2013 11:33 AM ( Status: F )  Sodium 141 135-145 mEq/L SLN  Potassium 3.7 3.5-5.3 mEq/L SLN  Chloride 105 96-112 mEq/L SLN  CO2 24 19-32 mEq/L SLN  Glucose 81 70-99 mg/dL SLN  BUN 31 H 6-23 mg/dL SLN  Creatinine 1.34 H 0.50-1.10 mg/dL SLN  Calcium 9.4 8.4-10.5 mg/dL SLN  Est GFR, African American 47 L mL/min SLN  Est GFR, NonAfrican American 40 L mL/min SLN  Hemoglobin A1C  Result: 05/02/2013 6:07 PM ( Status: F )  Hemoglobin A1C 6.2 H <5.7 % SLN C  Estimated Average Glucose 131 H <117 mg/dL SLN  Vitamin D (25-Hydroxy)  Result: 05/03/2013 1:00 AM ( Status: F )  Vitamin D (25-Hydroxy) 45  Lipid Profile  Result: 06/19/2013 4:04 PM ( Status: F )  Cholesterol 148 0-200 mg/dL SLN C  Triglyceride 285 H <150 mg/dL SLN  HDL Cholesterol 36 L >39 mg/dL SLN  Total Chol/HDL Ratio 4.1 Ratio SLN  VLDL Cholesterol (Calc) 57 H 0-40 mg/dL SLN  LDL Cholesterol (Calc) 55 0-99 mg/dL SLN C  Liver Profile  Result: 06/19/2013 4:04 PM ( Status: F )  Bilirubin, Total 0.3 0.3-1.2 mg/dL SLN  Bilirubin, Direct <0.1 0.0-0.3 mg/dL SLN  Indirect Bilirubin NOT CALC 0.0-0.9 mg/dL SLN  Alkaline Phosphatase 77 39-117 U/L SLN  AST/SGOT 19 0-37 U/L SLN  ALT/SGPT 12 0-35 U/L SLN  Total Protein 7.3 6.0-8.3 g/dL SLN  Albumin 4.3  CBC with Diff  Result: 08/31/2013 11:26 PM ( Status: F ) C  WBC 6.9 4.0-10.5 K/uL SLN  RBC 4.04 3.87-5.11 MIL/uL SLN  Hemoglobin 12.3 12.0-15.0 g/dL SLN  Hematocrit 36.6 36.0-46.0 % SLN  MCV 90.6 78.0-100.0 fL SLN  MCH 30.4 26.0-34.0 pg SLN  MCHC 33.6 30.0-36.0 g/dL SLN  RDW 13.0 11.5-15.5 % SLN  Platelet Count 263 150-400 K/uL SLN  Granulocyte % 52 43-77 % SLN  Absolute Gran 3.6 1.7-7.7 K/uL SLN  Lymph % 37 12-46 % SLN  Absolute Lymph 2.6 0.7-4.0 K/uL SLN  Mono % 7 3-12 % SLN  Absolute Mono  0.5 0.1-1.0 K/uL SLN  Eos % 4 0-5 % SLN  Absolute Eos 0.3 0.0-0.7 K/uL SLN  Baso % 0 0-1 % SLN  Absolute Baso 0.0 0.0-0.1 K/uL SLN  Smear Review Criteria for review not met SLN  Comprehensive Metabolic Panel  Result: 1/60/1093 1:23 AM ( Status: F )  Sodium 142 135-145 mEq/L SLN  Potassium 3.9 3.5-5.3 mEq/L SLN  Chloride 105 96-112 mEq/L  SLN  CO2 28 19-32 mEq/L SLN  Glucose 94 70-99 mg/dL SLN  BUN 25 H 6-23 mg/dL SLN  Creatinine 1.38 H 0.50-1.10 mg/dL SLN  Bilirubin, Total 0.4 0.3-1.2 mg/dL SLN  Alkaline Phosphatase 82 39-117 U/L SLN  AST/SGOT 20 0-37 U/L SLN  ALT/SGPT 17 0-35 U/L SLN  Total Protein 7.2 6.0-8.3 g/dL SLN  Albumin 4.1 3.5-5.2 g/dL SLN  Calcium 9.6 8.4-10.5 mg/dL SLN  Lipid Profile  Result: 09/21/2013 10:32 AM ( Status: F ) C  Cholesterol 133 0-200 mg/dL SLN C  Triglyceride 161 H <150 mg/dL SLN  HDL Cholesterol 37 L >39 mg/dL SLN  Total Chol/HDL Ratio 3.6 Ratio SLN  VLDL Cholesterol (Calc) 32 0-40 mg/dL SLN  LDL Cholesterol (Calc) 64 0-99  Assessment/Plan 1. Essential hypertension, benign -overall remains at goal; conts on clonidine, norvasc, labetalol  2. Dementia, unspecified, with behavioral disturbance -advanced, requires max assistance, eating well per husband, no major changes in the last months -no behaviors  -cont on namenda and aricept   3. Unspecified psychosis -remains controlled, no episodes  4. Anxiety state, unspecified -remains stable; conts zoloft 25 mg   5. Unspecified vitamin D deficiency -cont Vit D 1000 units daily as supplement  6. Other and unspecified hyperlipidemia LDL at goal; conts lipitor

## 2013-11-24 ENCOUNTER — Non-Acute Institutional Stay (SKILLED_NURSING_FACILITY): Payer: Medicare Other | Admitting: Nurse Practitioner

## 2013-11-24 DIAGNOSIS — I10 Essential (primary) hypertension: Secondary | ICD-10-CM

## 2013-11-24 DIAGNOSIS — F411 Generalized anxiety disorder: Secondary | ICD-10-CM

## 2013-11-24 DIAGNOSIS — E785 Hyperlipidemia, unspecified: Secondary | ICD-10-CM

## 2013-11-24 DIAGNOSIS — F29 Unspecified psychosis not due to a substance or known physiological condition: Secondary | ICD-10-CM

## 2013-11-24 DIAGNOSIS — F03918 Unspecified dementia, unspecified severity, with other behavioral disturbance: Secondary | ICD-10-CM

## 2013-11-24 DIAGNOSIS — E559 Vitamin D deficiency, unspecified: Secondary | ICD-10-CM

## 2013-11-24 DIAGNOSIS — J309 Allergic rhinitis, unspecified: Secondary | ICD-10-CM

## 2013-11-24 DIAGNOSIS — F0391 Unspecified dementia with behavioral disturbance: Secondary | ICD-10-CM

## 2013-11-24 NOTE — Progress Notes (Signed)
Patient ID: Amanda [name], female   DOB: [birth year], 68 y.o.   MRN: [id number]    Nursing Home Location:  [facility]   Place of Service: SNF (77)  PCP: Kandice Hams, MD  No Known Allergies  Chief Complaint  Patient presents with  . Medical Managment of Chronic Issues    HPI:  68 year old female with a PMH of renal insufficieny, dementia with behaviors, HTN and psychosis who is a long term resident of [facility] who is being seen today for routine follow up on chronic conditions; There has been no significant change in chronic conditions the last month and no new concerns per family or nursing.   Review of Systems:  Review of Systems  Unable to perform ROS: dementia     Past Medical History  Diagnosis Date  . Hypertension   . Stroke ~ 2003    "slight memory loss" (05/18/2012)  . Gout     "?feet" (05/18/2012)  . Dementia     "alzheimer's" (05/18/2012)  . Incontinence of urine   . Wrist fracture, bilateral 05/17/2012    "fell down steps" (05/18/2012)   Past Surgical History  Procedure Laterality Date  . Vaginal hysterectomy     Social History:   reports that she has been smoking Cigarettes.  She has been smoking about 0.00 packs per day. She has never used smokeless tobacco. She reports that she drinks alcohol. She reports that she uses illicit drugs (Marijuana).  Family History  Problem Relation Age of Onset  . Dementia Mother   . Dementia Father   . Dementia Sister   . Dementia Brother     Medications: Patient's Medications  New Prescriptions   No medications on file  Previous Medications   AMBULATORY NON FORMULARY MEDICATION    Lorazepam 0.33m gel Sig: Apply 0.523mtopically every 12 hours as needed for anxiety   AMLODIPINE (NORVASC) 10 MG TABLET    Take 10 mg by mouth daily.   ATORVASTATIN (LIPITOR) 10 MG TABLET    Take 10 mg by mouth daily.   CLONIDINE (CATAPRES) 0.1 MG TABLET    Take 0.1 mg by mouth 3 (three) times daily.    DONEPEZIL (ARICEPT) 10 MG TABLET    Take 10 mg by mouth at bedtime.   HYDROCODONE-ACETAMINOPHEN (NORCO) 5-325 MG PER TABLET    Take one tablet by mouth every 6 hours as needed; Take two tablets by mouth every 6 hours as needed for pain   LABETALOL (NORMODYNE) 300 MG TABLET    Take 300 mg by mouth 2 (two) times daily.   LORATADINE (CLARITIN) 10 MG TABLET    Take 10 mg by mouth daily.   MEMANTINE HCL ER (NAMENDA XR) 28 MG CP24    Take 28 mg by mouth daily.   MULTIPLE MINERALS-VITAMINS (CALCIUM & VIT D3 BONE HEALTH PO)    Take by mouth daily.   SERTRALINE (ZOLOFT) 50 MG TABLET    Take 25 mg by mouth daily.   Modified Medications   No medications on file  Discontinued Medications   No medications on file     Physical Exam:  Filed Vitals:   11/24/13 1254  BP: 140/78  Pulse: 68  Temp: 97.1 F (36.2 C)  Resp: 20   Physical Exam  Constitutional: She is well-developed, well-nourished, and in no distress.  HENT:  Head: Normocephalic and atraumatic.  Right Ear: External ear normal.  Left Ear: External ear normal.  Nose: Nose normal.  Mouth/Throat:  Oropharynx is clear and moist. No oropharyngeal exudate.  Eyes: Conjunctivae and EOM are normal. Pupils are equal, round, and reactive to light.  Neck: Normal range of motion. Neck supple.  Cardiovascular: Normal rate, regular rhythm and normal heart sounds.   Pulmonary/Chest: Effort normal and breath sounds normal. No respiratory distress.  Abdominal: Soft. Bowel sounds are normal. She exhibits no distension. There is no tenderness.  Musculoskeletal: Normal range of motion. She exhibits no edema and no tenderness.  Neurological: She is alert.  Skin: Skin is warm and dry. She is not diaphoretic.  Psychiatric:  pleasantly confused with occasional outburst that are able to be re-directed      Labs reviewed: BMP with Estimated GFR  Result: 04/04/2013 11:33 AM ( Status: F )  Sodium 141 135-145 mEq/L SLN  Potassium 3.7 3.5-5.3 mEq/L SLN    Chloride 105 96-112 mEq/L SLN  CO2 24 19-32 mEq/L SLN  Glucose 81 70-99 mg/dL SLN  BUN 31 H 6-23 mg/dL SLN  Creatinine 1.34 H 0.50-1.10 mg/dL SLN  Calcium 9.4 8.4-10.5 mg/dL SLN  Est GFR, African American 47 L mL/min SLN  Est GFR, NonAfrican American 40 L mL/min SLN  Hemoglobin A1C  Result: 05/02/2013 6:07 PM ( Status: F )  Hemoglobin A1C 6.2 H <5.7 % SLN C  Estimated Average Glucose 131 H <117 mg/dL SLN  Vitamin D (25-Hydroxy)  Result: 05/03/2013 1:00 AM ( Status: F )  Vitamin D (25-Hydroxy) 45  Lipid Profile  Result: 06/19/2013 4:04 PM ( Status: F )  Cholesterol 148 0-200 mg/dL SLN C  Triglyceride 285 H <150 mg/dL SLN  HDL Cholesterol 36 L >39 mg/dL SLN  Total Chol/HDL Ratio 4.1 Ratio SLN  VLDL Cholesterol (Calc) 57 H 0-40 mg/dL SLN  LDL Cholesterol (Calc) 55 0-99 mg/dL SLN C  Liver Profile  Result: 06/19/2013 4:04 PM ( Status: F )  Bilirubin, Total 0.3 0.3-1.2 mg/dL SLN  Bilirubin, Direct <0.1 0.0-0.3 mg/dL SLN  Indirect Bilirubin NOT CALC 0.0-0.9 mg/dL SLN  Alkaline Phosphatase 77 39-117 U/L SLN  AST/SGOT 19 0-37 U/L SLN  ALT/SGPT 12 0-35 U/L SLN  Total Protein 7.3 6.0-8.3 g/dL SLN  Albumin 4.3  CBC with Diff  Result: 08/31/2013 11:26 PM ( Status: F ) C  WBC 6.9 4.0-10.5 K/uL SLN  RBC 4.04 3.87-5.11 MIL/uL SLN  Hemoglobin 12.3 12.0-15.0 g/dL SLN  Hematocrit 36.6 36.0-46.0 % SLN  MCV 90.6 78.0-100.0 fL SLN  MCH 30.4 26.0-34.0 pg SLN  MCHC 33.6 30.0-36.0 g/dL SLN  RDW 13.0 11.5-15.5 % SLN  Platelet Count 263 150-400 K/uL SLN  Granulocyte % 52 43-77 % SLN  Absolute Gran 3.6 1.7-7.7 K/uL SLN  Lymph % 37 12-46 % SLN  Absolute Lymph 2.6 0.7-4.0 K/uL SLN  Mono % 7 3-12 % SLN  Absolute Mono 0.5 0.1-1.0 K/uL SLN  Eos % 4 0-5 % SLN  Absolute Eos 0.3 0.0-0.7 K/uL SLN  Baso % 0 0-1 % SLN  Absolute Baso 0.0 0.0-0.1 K/uL SLN  Smear Review Criteria for review not met SLN  Comprehensive Metabolic Panel  Result: 11/03/7122 1:23 AM ( Status: F )  Sodium 142 135-145 mEq/L SLN   Potassium 3.9 3.5-5.3 mEq/L SLN  Chloride 105 96-112 mEq/L SLN  CO2 28 19-32 mEq/L SLN  Glucose 94 70-99 mg/dL SLN  BUN 25 H 6-23 mg/dL SLN  Creatinine 1.38 H 0.50-1.10 mg/dL SLN  Bilirubin, Total 0.4 0.3-1.2 mg/dL SLN  Alkaline Phosphatase 82 39-117 U/L SLN  AST/SGOT 20 0-37 U/L SLN  ALT/SGPT 17  0-35 U/L SLN  Total Protein 7.2 6.0-8.3 g/dL SLN  Albumin 4.1 3.5-5.2 g/dL SLN  Calcium 9.6 8.4-10.5 mg/dL SLN  Lipid Profile  Result: 09/21/2013 10:32 AM ( Status: F ) C  Cholesterol 133 0-200 mg/dL SLN C  Triglyceride 161 H <150 mg/dL SLN  HDL Cholesterol 37 L >39 mg/dL SLN  Total Chol/HDL Ratio 3.6 Ratio SLN  VLDL Cholesterol (Calc) 32 0-40 mg/dL SLN  LDL Cholesterol (Calc) 64 0-99  Assessment/Plan 1. Unspecified psychosis -remains stable. No worsening behaviors noted  2. Other and unspecified hyperlipidemia -conts lipitor- LDL currently at goal.   3. Anxiety state, unspecified -stable on current medication  4. Dementia, unspecified, with behavioral disturbance -stable at this time and without significant   5. Essential hypertension, benign -elevated at times on reivew of vital signs but overall all controlled for age   97. Unspecified vitamin D deficiency -conts on vit d 1000 units daily   7. Allergic rhinitis -stable on claritin

## 2013-12-04 ENCOUNTER — Other Ambulatory Visit: Payer: Self-pay | Admitting: *Deleted

## 2013-12-04 MED ORDER — LORAZEPAM 1 MG PO TABS: ORAL_TABLET | ORAL | Status: DC

## 2013-12-04 MED ORDER — LORAZEPAM 2 MG PO TABS: ORAL_TABLET | ORAL | Status: DC

## 2013-12-04 NOTE — Telephone Encounter (Signed)
[facility]  

## 2013-12-04 NOTE — Telephone Encounter (Signed)
[facility] 

## 2013-12-26 ENCOUNTER — Non-Acute Institutional Stay (SKILLED_NURSING_FACILITY): Payer: Medicare Other | Admitting: Nurse Practitioner

## 2013-12-26 DIAGNOSIS — F03918 Unspecified dementia, unspecified severity, with other behavioral disturbance: Secondary | ICD-10-CM

## 2013-12-26 DIAGNOSIS — I10 Essential (primary) hypertension: Secondary | ICD-10-CM

## 2013-12-26 DIAGNOSIS — E559 Vitamin D deficiency, unspecified: Secondary | ICD-10-CM

## 2013-12-26 DIAGNOSIS — F0391 Unspecified dementia with behavioral disturbance: Secondary | ICD-10-CM

## 2013-12-26 DIAGNOSIS — F411 Generalized anxiety disorder: Secondary | ICD-10-CM

## 2013-12-26 DIAGNOSIS — J309 Allergic rhinitis, unspecified: Secondary | ICD-10-CM

## 2013-12-26 NOTE — Progress Notes (Signed)
Patient ID: Amanda [name], female   DOB: [birth year], 68 y.o.   MRN: [id number]    Nursing Home Location:  [facility]   Place of Service: SNF (17)  PCP: Kandice Hams, MD  No Known Allergies  Chief Complaint  Patient presents with  . Medical Management of Chronic Issues    HPI:  68 year old female with a PMH of renal insufficieny, dementia with behaviors, HTN and psychosis who is a long term resident of [facility] who is being seen today for routine follow up on chronic conditions; husband at bedside without concerns.  There has been no significant change in chronic conditions the last month and no new concerns per family or nursing.   Review of Systems:  Review of Systems  Unable to perform ROS: dementia     Past Medical History  Diagnosis Date  . Hypertension   . Stroke ~ 2003    "slight memory loss" (05/18/2012)  . Gout     "?feet" (05/18/2012)  . Dementia     "alzheimer's" (05/18/2012)  . Incontinence of urine   . Wrist fracture, bilateral 05/17/2012    "fell down steps" (05/18/2012)   Past Surgical History  Procedure Laterality Date  . Vaginal hysterectomy     Social History:   reports that she has been smoking Cigarettes.  She has been smoking about 0.00 packs per day. She has never used smokeless tobacco. She reports that she drinks alcohol. She reports that she uses illicit drugs (Marijuana).  Family History  Problem Relation Age of Onset  . Dementia Mother   . Dementia Father   . Dementia Sister   . Dementia Brother     Medications: Patient's Medications  New Prescriptions   No medications on file  Previous Medications   AMBULATORY NON FORMULARY MEDICATION    Lorazepam 0.56m gel Sig: Apply 0.565mtopically every 12 hours as needed for anxiety   AMLODIPINE (NORVASC) 10 MG TABLET    Take 10 mg by mouth daily.   ATORVASTATIN (LIPITOR) 10 MG TABLET    Take 10 mg by mouth daily.   CLONIDINE (CATAPRES) 0.1 MG TABLET    Take 0.1 mg by  mouth 3 (three) times daily.    DONEPEZIL (ARICEPT) 10 MG TABLET    Take 10 mg by mouth at bedtime.   HYDROCODONE-ACETAMINOPHEN (NORCO) 5-325 MG PER TABLET    Take one tablet by mouth every 6 hours as needed; Take two tablets by mouth every 6 hours as needed for pain   LABETALOL (NORMODYNE) 300 MG TABLET    Take 300 mg by mouth 2 (two) times daily.   LORATADINE (CLARITIN) 10 MG TABLET    Take 10 mg by mouth daily.   LORAZEPAM (ATIVAN) 1 MG TABLET    Take one tablet by mouth prior to dental procedure   LORAZEPAM (ATIVAN) 2 MG TABLET    Take one tablet by mouth prior to next dentist visit   MEMANTINE HCL ER (NAMENDA XR) 28 MG CP24    Take 28 mg by mouth daily.   MULTIPLE MINERALS-VITAMINS (CALCIUM & VIT D3 BONE HEALTH PO)    Take by mouth daily.   SERTRALINE (ZOLOFT) 50 MG TABLET    Take 25 mg by mouth daily.   Modified Medications   No medications on file  Discontinued Medications   No medications on file     Physical Exam:  Filed Vitals:   12/26/13 1231  BP: 105/65  Pulse: 54  Temp:  97.9 F (36.6 C)  Resp: 18  Weight: 125 lb 6.4 oz (56.881 kg)   Physical Exam  Constitutional: She is well-developed, well-nourished, and in no distress.  HENT:  Head: Normocephalic and atraumatic.  Right Ear: External ear normal.  Left Ear: External ear normal.  Nose: Nose normal.  Mouth/Throat: Oropharynx is clear and moist. No oropharyngeal exudate.  Eyes: Conjunctivae and EOM are normal. Pupils are equal, round, and reactive to light.  Neck: Normal range of motion. Neck supple.  Cardiovascular: Normal rate, regular rhythm and normal heart sounds.   Pulmonary/Chest: Effort normal and breath sounds normal. No respiratory distress.  Abdominal: Soft. Bowel sounds are normal.  Musculoskeletal: Normal range of motion. She exhibits no edema and no tenderness.  Neurological: She is alert.  Skin: Skin is warm and dry. She is not diaphoretic.  Psychiatric:  conts to be pleasantly confused with  occasional outburst that are able to be re-directed      Labs reviewed: BMP with Estimated GFR  Result: 04/04/2013 11:33 AM ( Status: F )  Sodium 141 135-145 mEq/L SLN  Potassium 3.7 3.5-5.3 mEq/L SLN  Chloride 105 96-112 mEq/L SLN  CO2 24 19-32 mEq/L SLN  Glucose 81 70-99 mg/dL SLN  BUN 31 H 6-23 mg/dL SLN  Creatinine 1.34 H 0.50-1.10 mg/dL SLN  Calcium 9.4 8.4-10.5 mg/dL SLN  Est GFR, African American 47 L mL/min SLN  Est GFR, NonAfrican American 40 L mL/min SLN  Hemoglobin A1C  Result: 05/02/2013 6:07 PM ( Status: F )  Hemoglobin A1C 6.2 H <5.7 % SLN C  Estimated Average Glucose 131 H <117 mg/dL SLN  Vitamin D (25-Hydroxy)  Result: 05/03/2013 1:00 AM ( Status: F )  Vitamin D (25-Hydroxy) 45  Lipid Profile  Result: 06/19/2013 4:04 PM ( Status: F )  Cholesterol 148 0-200 mg/dL SLN C  Triglyceride 285 H <150 mg/dL SLN  HDL Cholesterol 36 L >39 mg/dL SLN  Total Chol/HDL Ratio 4.1 Ratio SLN  VLDL Cholesterol (Calc) 57 H 0-40 mg/dL SLN  LDL Cholesterol (Calc) 55 0-99 mg/dL SLN C  Liver Profile  Result: 06/19/2013 4:04 PM ( Status: F )  Bilirubin, Total 0.3 0.3-1.2 mg/dL SLN  Bilirubin, Direct <0.1 0.0-0.3 mg/dL SLN  Indirect Bilirubin NOT CALC 0.0-0.9 mg/dL SLN  Alkaline Phosphatase 77 39-117 U/L SLN  AST/SGOT 19 0-37 U/L SLN  ALT/SGPT 12 0-35 U/L SLN  Total Protein 7.3 6.0-8.3 g/dL SLN  Albumin 4.3  CBC with Diff  Result: 08/31/2013 11:26 PM ( Status: F ) C  WBC 6.9 4.0-10.5 K/uL SLN  RBC 4.04 3.87-5.11 MIL/uL SLN  Hemoglobin 12.3 12.0-15.0 g/dL SLN  Hematocrit 36.6 36.0-46.0 % SLN  MCV 90.6 78.0-100.0 fL SLN  MCH 30.4 26.0-34.0 pg SLN  MCHC 33.6 30.0-36.0 g/dL SLN  RDW 13.0 11.5-15.5 % SLN  Platelet Count 263 150-400 K/uL SLN  Granulocyte % 52 43-77 % SLN  Absolute Gran 3.6 1.7-7.7 K/uL SLN  Lymph % 37 12-46 % SLN  Absolute Lymph 2.6 0.7-4.0 K/uL SLN  Mono % 7 3-12 % SLN  Absolute Mono 0.5 0.1-1.0 K/uL SLN  Eos % 4 0-5 % SLN  Absolute Eos 0.3 0.0-0.7 K/uL SLN    Baso % 0 0-1 % SLN  Absolute Baso 0.0 0.0-0.1 K/uL SLN  Smear Review Criteria for review not met SLN  Comprehensive Metabolic Panel  Result: 9/81/1914 1:23 AM ( Status: F )  Sodium 142 135-145 mEq/L SLN  Potassium 3.9 3.5-5.3 mEq/L SLN  Chloride 105 96-112 mEq/L SLN  CO2 28 19-32 mEq/L SLN  Glucose 94 70-99 mg/dL SLN  BUN 25 H 6-23 mg/dL SLN  Creatinine 1.38 H 0.50-1.10 mg/dL SLN  Bilirubin, Total 0.4 0.3-1.2 mg/dL SLN  Alkaline Phosphatase 82 39-117 U/L SLN  AST/SGOT 20 0-37 U/L SLN  ALT/SGPT 17 0-35 U/L SLN  Total Protein 7.2 6.0-8.3 g/dL SLN  Albumin 4.1 3.5-5.2 g/dL SLN  Calcium 9.6 8.4-10.5 mg/dL SLN  Lipid Profile  Result: 09/21/2013 10:32 AM ( Status: F ) C  Cholesterol 133 0-200 mg/dL SLN C  Triglyceride 161 H <150 mg/dL SLN  HDL Cholesterol 37 L >39 mg/dL SLN  Total Chol/HDL Ratio 3.6 Ratio SLN  VLDL Cholesterol (Calc) 32 0-40 mg/dL SLN  LDL Cholesterol (Calc) 64 0-99   Assessment/Plan 1. Anxiety state, unspecified -REMAINS STABLE ON ZOLOFT  2. Dementia, unspecified, with behavioral disturbance -WITHOUT SIGNIFICANT CHANGES, CONTS ON ARICEPT AND NAMENDA  3. Allergic rhinitis -CONT CLARITIN   4. Essential hypertension, benign -WELL CONTROLLED CURRENTLY  5. Unspecified vitamin D deficiency -WILL FOLLOW UP VIT D LEVEL   LABS- WILL GET VIT D, CBC, CMP

## 2014-01-08 ENCOUNTER — Other Ambulatory Visit: Payer: Self-pay | Admitting: *Deleted

## 2014-01-08 MED ORDER — HYDROCODONE-ACETAMINOPHEN 5-325 MG PO TABS: ORAL_TABLET | ORAL | Status: DC

## 2014-01-08 NOTE — Telephone Encounter (Signed)
[facility] 

## 2014-01-24 ENCOUNTER — Other Ambulatory Visit: Payer: Self-pay | Admitting: *Deleted

## 2014-01-24 MED ORDER — AMBULATORY NON FORMULARY MEDICATION: Status: DC

## 2014-01-24 NOTE — Telephone Encounter (Signed)
[facility] 

## 2014-02-02 ENCOUNTER — Non-Acute Institutional Stay (SKILLED_NURSING_FACILITY): Payer: Medicare Other | Admitting: Nurse Practitioner

## 2014-02-02 DIAGNOSIS — E559 Vitamin D deficiency, unspecified: Secondary | ICD-10-CM

## 2014-02-02 DIAGNOSIS — F0391 Unspecified dementia with behavioral disturbance: Secondary | ICD-10-CM

## 2014-02-02 DIAGNOSIS — I10 Essential (primary) hypertension: Secondary | ICD-10-CM

## 2014-02-02 DIAGNOSIS — F411 Generalized anxiety disorder: Secondary | ICD-10-CM

## 2014-02-02 DIAGNOSIS — F03918 Unspecified dementia, unspecified severity, with other behavioral disturbance: Secondary | ICD-10-CM

## 2014-02-02 DIAGNOSIS — J301 Allergic rhinitis due to pollen: Secondary | ICD-10-CM

## 2014-02-02 NOTE — Progress Notes (Signed)
Patient ID: Amanda [name], female   DOB: 1945/10/04, 68 y.o.   MRN: [id number]    Nursing Home Location:  [facility]   Place of Service: SNF (105)  PCP: Kandice Hams, MD  No Known Allergies  Chief Complaint  Patient presents with  . Medical Management of Chronic Issues    HPI:  68 year old female with a PMH of renal insufficieny, dementia with behaviors, HTN and psychosis who is a long term resident of [facility] who is being seen today for routine follow up on chronic conditions, in the past month there has been no significant change in chronic conditions. Staff reports pt is doing well and has no concerns at this time. conts to have good appetite, good BMs, requiring care for ADLs. No changes or worsening behaviors noted.  Review of Systems:  Unable to obtain due to dementia   Past Medical History  Diagnosis Date  . Hypertension   . Stroke ~ 2003    "slight memory loss" (05/18/2012)  . Gout     "?feet" (05/18/2012)  . Dementia     "alzheimer's" (05/18/2012)  . Incontinence of urine   . Wrist fracture, bilateral 05/17/2012    "fell down steps" (05/18/2012)   Past Surgical History  Procedure Laterality Date  . Vaginal hysterectomy     Social History:   reports that she has been smoking Cigarettes.  She has been smoking about 0.00 packs per day. She has never used smokeless tobacco. She reports that she drinks alcohol. She reports that she uses illicit drugs (Marijuana).  Family History  Problem Relation Age of Onset  . Dementia Mother   . Dementia Father   . Dementia Sister   . Dementia Brother     Medications: Patient's Medications  New Prescriptions   No medications on file  Previous Medications   AMBULATORY NON FORMULARY MEDICATION    Lorazepam 0.25m gel Sig: Apply 0.521mtopically every 12 hours as needed for anxiety   AMLODIPINE (NORVASC) 10 MG TABLET    Take 10 mg by mouth daily.   ATORVASTATIN (LIPITOR) 10 MG TABLET    Take 10 mg by  mouth daily.   CLONIDINE (CATAPRES) 0.1 MG TABLET    Take 0.1 mg by mouth 3 (three) times daily.    DONEPEZIL (ARICEPT) 10 MG TABLET    Take 10 mg by mouth at bedtime.   HYDROCODONE-ACETAMINOPHEN (NORCO) 5-325 MG PER TABLET    Take one tablet by mouth every 6 hours as needed; Take two tablets by mouth every 6 hours as needed for pain   LABETALOL (NORMODYNE) 300 MG TABLET    Take 300 mg by mouth 2 (two) times daily.   LORATADINE (CLARITIN) 10 MG TABLET    Take 10 mg by mouth daily.   LORAZEPAM (ATIVAN) 1 MG TABLET    Take one tablet by mouth prior to dental procedure   LORAZEPAM (ATIVAN) 2 MG TABLET    Take one tablet by mouth prior to next dentist visit   MEMANTINE HCL ER (NAMENDA XR) 28 MG CP24    Take 28 mg by mouth daily.   MULTIPLE MINERALS-VITAMINS (CALCIUM & VIT D3 BONE HEALTH PO)    Take by mouth daily.   SERTRALINE (ZOLOFT) 50 MG TABLET    Take 25 mg by mouth daily.   Modified Medications   No medications on file  Discontinued Medications   No medications on file     Physical Exam:  Filed Vitals:  02/02/14 1738  BP: 116/68  Pulse: 58  Temp: 98 F (36.7 C)  Resp: 18   Physical Exam  Constitutional: She is well-developed, well-nourished, and in no distress.  Eyes: Conjunctivae are normal.  Neck: Normal range of motion. Neck supple.  Cardiovascular: Normal rate, regular rhythm and normal heart sounds.   Pulmonary/Chest: Effort normal and breath sounds normal. No respiratory distress.  Abdominal: Soft. Bowel sounds are normal.  Musculoskeletal: Normal range of motion. She exhibits no edema and no tenderness.  Neurological: She is alert.  Skin: Skin is warm and dry. She is not diaphoretic.  Psychiatric:  dementia      Labs reviewed: BMP with Estimated GFR  Result: 04/04/2013 11:33 AM ( Status: F )  Sodium 141 135-145 mEq/L SLN  Potassium 3.7 3.5-5.3 mEq/L SLN  Chloride 105 96-112 mEq/L SLN  CO2 24 19-32 mEq/L SLN  Glucose 81 70-99 mg/dL SLN  BUN 31 H 6-23 mg/dL  SLN  Creatinine 1.34 H 0.50-1.10 mg/dL SLN  Calcium 9.4 8.4-10.5 mg/dL SLN  Est GFR, African American 47 L mL/min SLN  Est GFR, NonAfrican American 40 L mL/min SLN  Hemoglobin A1C  Result: 05/02/2013 6:07 PM ( Status: F )  Hemoglobin A1C 6.2 H <5.7 % SLN C  Estimated Average Glucose 131 H <117 mg/dL SLN  Vitamin D (25-Hydroxy)  Result: 05/03/2013 1:00 AM ( Status: F )  Vitamin D (25-Hydroxy) 45  Lipid Profile  Result: 06/19/2013 4:04 PM ( Status: F )  Cholesterol 148 0-200 mg/dL SLN C  Triglyceride 285 H <150 mg/dL SLN  HDL Cholesterol 36 L >39 mg/dL SLN  Total Chol/HDL Ratio 4.1 Ratio SLN  VLDL Cholesterol (Calc) 57 H 0-40 mg/dL SLN  LDL Cholesterol (Calc) 55 0-99 mg/dL SLN C  Liver Profile  Result: 06/19/2013 4:04 PM ( Status: F )  Bilirubin, Total 0.3 0.3-1.2 mg/dL SLN  Bilirubin, Direct <0.1 0.0-0.3 mg/dL SLN  Indirect Bilirubin NOT CALC 0.0-0.9 mg/dL SLN  Alkaline Phosphatase 77 39-117 U/L SLN  AST/SGOT 19 0-37 U/L SLN  ALT/SGPT 12 0-35 U/L SLN  Total Protein 7.3 6.0-8.3 g/dL SLN  Albumin 4.3  CBC with Diff  Result: 08/31/2013 11:26 PM ( Status: F ) C  WBC 6.9 4.0-10.5 K/uL SLN  RBC 4.04 3.87-5.11 MIL/uL SLN  Hemoglobin 12.3 12.0-15.0 g/dL SLN  Hematocrit 36.6 36.0-46.0 % SLN  MCV 90.6 78.0-100.0 fL SLN  MCH 30.4 26.0-34.0 pg SLN  MCHC 33.6 30.0-36.0 g/dL SLN  RDW 13.0 11.5-15.5 % SLN  Platelet Count 263 150-400 K/uL SLN  Granulocyte % 52 43-77 % SLN  Absolute Gran 3.6 1.7-7.7 K/uL SLN  Lymph % 37 12-46 % SLN  Absolute Lymph 2.6 0.7-4.0 K/uL SLN  Mono % 7 3-12 % SLN  Absolute Mono 0.5 0.1-1.0 K/uL SLN  Eos % 4 0-5 % SLN  Absolute Eos 0.3 0.0-0.7 K/uL SLN  Baso % 0 0-1 % SLN  Absolute Baso 0.0 0.0-0.1 K/uL SLN  Smear Review Criteria for review not met SLN  Comprehensive Metabolic Panel  Result: 7/62/8315 1:23 AM ( Status: F )  Sodium 142 135-145 mEq/L SLN  Potassium 3.9 3.5-5.3 mEq/L SLN  Chloride 105 96-112 mEq/L SLN  CO2 28 19-32 mEq/L SLN  Glucose 94  70-99 mg/dL SLN  BUN 25 H 6-23 mg/dL SLN  Creatinine 1.38 H 0.50-1.10 mg/dL SLN  Bilirubin, Total 0.4 0.3-1.2 mg/dL SLN  Alkaline Phosphatase 82 39-117 U/L SLN  AST/SGOT 20 0-37 U/L SLN  ALT/SGPT 17 0-35 U/L SLN  Total Protein  7.2 6.0-8.3 g/dL SLN  Albumin 4.1 3.5-5.2 g/dL SLN  Calcium 9.6 8.4-10.5 mg/dL SLN  Lipid Profile  Result: 09/21/2013 10:32 AM ( Status: F ) C  Cholesterol 133 0-200 mg/dL SLN C  Triglyceride 161 H <150 mg/dL SLN  HDL Cholesterol 37 L >39 mg/dL SLN  Total Chol/HDL Ratio 3.6 Ratio SLN  VLDL Cholesterol (Calc) 32 0-40 mg/dL SLN  LDL Cholesterol (Calc) 64 0-99   Assessment/Plan 1. Dementia, unspecified, with behavioral disturbance Behaviors have been stable, there is no significant decline in the past months.   2. Essential hypertension, benign Currently well controlled on catapres, norvasc, and labetalol -follow up CMP  3. Allergic rhinitis due to pollen -without symptoms, conts on claritin   4. Anxiety state, unspecified Has been stable with zoloft 25 mg daily  5. Unspecified vitamin D deficiency conts VIt d, follow up vit d level

## 2014-03-12 ENCOUNTER — Non-Acute Institutional Stay (SKILLED_NURSING_FACILITY): Payer: Medicare Other | Admitting: Nurse Practitioner

## 2014-03-12 DIAGNOSIS — F411 Generalized anxiety disorder: Secondary | ICD-10-CM

## 2014-03-12 DIAGNOSIS — I10 Essential (primary) hypertension: Secondary | ICD-10-CM

## 2014-03-12 DIAGNOSIS — N289 Disorder of kidney and ureter, unspecified: Secondary | ICD-10-CM

## 2014-03-12 DIAGNOSIS — F0391 Unspecified dementia with behavioral disturbance: Secondary | ICD-10-CM

## 2014-03-12 DIAGNOSIS — F03918 Unspecified dementia, unspecified severity, with other behavioral disturbance: Secondary | ICD-10-CM

## 2014-03-12 NOTE — Progress Notes (Signed)
Patient ID: Amanda [name], female   DOB: Nov 01, 1945, 68 y.o.   MRN: [id number]    Nursing Home Location:  [facility]   Place of Service: SNF (28)  PCP: Kandice Hams, MD  No Known Allergies  Chief Complaint  Patient presents with  . Medical Management of Chronic Issues    HPI:  68 year old female with a PMH of renal insufficieny, dementia with behaviors, HTN and psychosis who is a long term resident of [facility] who is being seen today for routine follow up on chronic conditions, in the past month there has been no significant change in chronic conditions.  Labs revealed slightly worsening renal function and fluid has been encouraged.     Review of Systems:  Unable to obtain due to dementia   Past Medical History  Diagnosis Date  . Hypertension   . Stroke ~ 2003    "slight memory loss" (05/18/2012)  . Gout     "?feet" (05/18/2012)  . Dementia     "alzheimer's" (05/18/2012)  . Incontinence of urine   . Wrist fracture, bilateral 05/17/2012    "fell down steps" (05/18/2012)   Past Surgical History  Procedure Laterality Date  . Vaginal hysterectomy     Social History:   reports that she has been smoking Cigarettes.  She has been smoking about 0.00 packs per day. She has never used smokeless tobacco. She reports that she drinks alcohol. She reports that she uses illicit drugs (Marijuana).  Family History  Problem Relation Age of Onset  . Dementia Mother   . Dementia Father   . Dementia Sister   . Dementia Brother     Medications: Patient's Medications  New Prescriptions   No medications on file  Previous Medications   AMBULATORY NON FORMULARY MEDICATION    Lorazepam 0.48m gel Sig: Apply 0.570mtopically every 12 hours as needed for anxiety   AMLODIPINE (NORVASC) 10 MG TABLET    Take 10 mg by mouth daily.   ATORVASTATIN (LIPITOR) 10 MG TABLET    Take 10 mg by mouth daily.   CLONIDINE (CATAPRES) 0.1 MG TABLET    Take 0.1 mg by mouth 3 (three)  times daily.    DONEPEZIL (ARICEPT) 10 MG TABLET    Take 10 mg by mouth at bedtime.   HYDROCODONE-ACETAMINOPHEN (NORCO) 5-325 MG PER TABLET    Take one tablet by mouth every 6 hours as needed; Take two tablets by mouth every 6 hours as needed for pain   LABETALOL (NORMODYNE) 300 MG TABLET    Take 300 mg by mouth 2 (two) times daily.   LORATADINE (CLARITIN) 10 MG TABLET    Take 10 mg by mouth daily.   LORAZEPAM (ATIVAN) 1 MG TABLET    Take one tablet by mouth prior to dental procedure   LORAZEPAM (ATIVAN) 2 MG TABLET    Take one tablet by mouth prior to next dentist visit   MEMANTINE HCL ER (NAMENDA XR) 28 MG CP24    Take 28 mg by mouth daily.   MULTIPLE MINERALS-VITAMINS (CALCIUM & VIT D3 BONE HEALTH PO)    Take by mouth daily.   SERTRALINE (ZOLOFT) 50 MG TABLET    Take 25 mg by mouth daily.   Modified Medications   No medications on file  Discontinued Medications   No medications on file     Physical Exam:  Filed Vitals:   03/12/14 1257  BP: 101/64  Pulse: 60  Temp: 97 F (36.1 C)  Resp: 20  Weight: 123 lb (55.792 kg)   Physical Exam  Constitutional: She is well-developed, well-nourished, and in no distress.  Eyes: Conjunctivae are normal.  Neck: Normal range of motion. Neck supple.  Cardiovascular: Normal rate, regular rhythm and normal heart sounds.   Pulmonary/Chest: Effort normal and breath sounds normal. No respiratory distress.  Abdominal: Soft. Bowel sounds are normal.  Musculoskeletal: Normal range of motion. She exhibits no edema.  Neurological: She is alert.  Skin: Skin is warm and dry. She is not diaphoretic.  Psychiatric:  dementia      Labs reviewed: BMP with Estimated GFR  Result: 04/04/2013 11:33 AM ( Status: F )  Sodium 141 135-145 mEq/L SLN  Potassium 3.7 3.5-5.3 mEq/L SLN  Chloride 105 96-112 mEq/L SLN  CO2 24 19-32 mEq/L SLN  Glucose 81 70-99 mg/dL SLN  BUN 31 H 6-23 mg/dL SLN  Creatinine 1.34 H 0.50-1.10 mg/dL SLN  Calcium 9.4 8.4-10.5 mg/dL  SLN  Est GFR, African American 47 L mL/min SLN  Est GFR, NonAfrican American 40 L mL/min SLN  Hemoglobin A1C  Result: 05/02/2013 6:07 PM ( Status: F )  Hemoglobin A1C 6.2 H <5.7 % SLN C  Estimated Average Glucose 131 H <117 mg/dL SLN  Vitamin D (25-Hydroxy)  Result: 05/03/2013 1:00 AM ( Status: F )  Vitamin D (25-Hydroxy) 45  Lipid Profile  Result: 06/19/2013 4:04 PM ( Status: F )  Cholesterol 148 0-200 mg/dL SLN C  Triglyceride 285 H <150 mg/dL SLN  HDL Cholesterol 36 L >39 mg/dL SLN  Total Chol/HDL Ratio 4.1 Ratio SLN  VLDL Cholesterol (Calc) 57 H 0-40 mg/dL SLN  LDL Cholesterol (Calc) 55 0-99 mg/dL SLN C  Liver Profile  Result: 06/19/2013 4:04 PM ( Status: F )  Bilirubin, Total 0.3 0.3-1.2 mg/dL SLN  Bilirubin, Direct <0.1 0.0-0.3 mg/dL SLN  Indirect Bilirubin NOT CALC 0.0-0.9 mg/dL SLN  Alkaline Phosphatase 77 39-117 U/L SLN  AST/SGOT 19 0-37 U/L SLN  ALT/SGPT 12 0-35 U/L SLN  Total Protein 7.3 6.0-8.3 g/dL SLN  Albumin 4.3  CBC with Diff  Result: 08/31/2013 11:26 PM ( Status: F ) C  WBC 6.9 4.0-10.5 K/uL SLN  RBC 4.04 3.87-5.11 MIL/uL SLN  Hemoglobin 12.3 12.0-15.0 g/dL SLN  Hematocrit 36.6 36.0-46.0 % SLN  MCV 90.6 78.0-100.0 fL SLN  MCH 30.4 26.0-34.0 pg SLN  MCHC 33.6 30.0-36.0 g/dL SLN  RDW 13.0 11.5-15.5 % SLN  Platelet Count 263 150-400 K/uL SLN  Granulocyte % 52 43-77 % SLN  Absolute Gran 3.6 1.7-7.7 K/uL SLN  Lymph % 37 12-46 % SLN  Absolute Lymph 2.6 0.7-4.0 K/uL SLN  Mono % 7 3-12 % SLN  Absolute Mono 0.5 0.1-1.0 K/uL SLN  Eos % 4 0-5 % SLN  Absolute Eos 0.3 0.0-0.7 K/uL SLN  Baso % 0 0-1 % SLN  Absolute Baso 0.0 0.0-0.1 K/uL SLN  Smear Review Criteria for review not met SLN  Comprehensive Metabolic Panel  Result: 01/24/736 1:23 AM ( Status: F )  Sodium 142 135-145 mEq/L SLN  Potassium 3.9 3.5-5.3 mEq/L SLN  Chloride 105 96-112 mEq/L SLN  CO2 28 19-32 mEq/L SLN  Glucose 94 70-99 mg/dL SLN  BUN 25 H 6-23 mg/dL SLN  Creatinine 1.38 H 0.50-1.10 mg/dL  SLN  Bilirubin, Total 0.4 0.3-1.2 mg/dL SLN  Alkaline Phosphatase 82 39-117 U/L SLN  AST/SGOT 20 0-37 U/L SLN  ALT/SGPT 17 0-35 U/L SLN  Total Protein 7.2 6.0-8.3 g/dL SLN  Albumin 4.1 3.5-5.2 g/dL SLN  Calcium  9.6 8.4-10.5 mg/dL SLN  Lipid Profile  Result: 09/21/2013 10:32 AM ( Status: F ) C  Cholesterol 133 0-200 mg/dL SLN C  Triglyceride 161 H <150 mg/dL SLN  HDL Cholesterol 37 L >39 mg/dL SLN  Total Chol/HDL Ratio 3.6 Ratio SLN  VLDL Cholesterol (Calc) 32 0-40 mg/dL SLN  LDL Cholesterol (Calc) 64 0-99 CBC with Diff    Result: 02/05/2014 2:26 PM   ( Status: F )       WBC 7.4     4.0-10.5 K/uL SLN   RBC 3.84   L 3.87-5.11 MIL/uL SLN   Hemoglobin 11.8   L 12.0-15.0 g/dL SLN   Hematocrit 34.2   L 36.0-46.0 % SLN   MCV 89.1     78.0-100.0 fL SLN   MCH 30.7     26.0-34.0 pg SLN   MCHC 34.5     30.0-36.0 g/dL SLN   RDW 13.3     11.5-15.5 % SLN   Platelet Count 304     150-400 K/uL SLN   Granulocyte % 55     43-77 % SLN   Absolute Gran 4.1     1.7-7.7 K/uL SLN   Lymph % 37     12-46 % SLN   Absolute Lymph 2.7     0.7-4.0 K/uL SLN   Mono % 6     3-12 % SLN   Absolute Mono 0.4     0.1-1.0 K/uL SLN   Eos % 2     0-5 % SLN   Absolute Eos 0.1     0.0-0.7 K/uL SLN   Baso % 0     0-1 % SLN   Absolute Baso 0.0     0.0-0.1 K/uL SLN   Smear Review Criteria for review not met  SLN   Comprehensive Metabolic Panel    Result: 02/05/2014 2:43 PM   ( Status: F )       Sodium 143     135-145 mEq/L SLN   Potassium 4.3     3.5-5.3 mEq/L SLN   Chloride 106     96-112 mEq/L SLN   CO2 25     19-32 mEq/L SLN   Glucose 98     70-99 mg/dL SLN   BUN 36   H 6-23 mg/dL SLN   Creatinine 1.84   H 0.50-1.10 mg/dL SLN   Bilirubin, Total 0.3     0.2-1.2 mg/dL SLN   Alkaline Phosphatase 80     39-117 U/L SLN   AST/SGOT 23     0-37 U/L SLN   ALT/SGPT 17     0-35 U/L SLN   Total Protein 7.0     6.0-8.3 g/dL SLN   Albumin 4.1     3.5-5.2 g/dL SLN   Calcium 9.3     8.4-10.5 mg/dL SLN   Vitamin D  (25-Hydroxy)    Result: 02/06/2014 4:02 AM   ( Status: F )       Vitamin D (25-Hydroxy) 45     30-89 ng/mL SLN C  Assessment/Plan 1. Dementia, unspecified, with behavioral disturbance -stable at this time, without changes in function or cognitive status  2. Anxiety state, unspecified Has been stable, will dc off zoloft at this time  3. Essential hypertension, benign -Patients hypertension is stable; continue current regimen. Will monitor and make changes as necessary.  4. Renal insufficiency -encouraged fluids, will follow up bmp

## 2014-05-04 ENCOUNTER — Non-Acute Institutional Stay (SKILLED_NURSING_FACILITY): Payer: Medicare Other | Admitting: Nurse Practitioner

## 2014-05-04 DIAGNOSIS — N289 Disorder of kidney and ureter, unspecified: Secondary | ICD-10-CM

## 2014-05-04 DIAGNOSIS — I10 Essential (primary) hypertension: Secondary | ICD-10-CM

## 2014-05-04 DIAGNOSIS — F411 Generalized anxiety disorder: Secondary | ICD-10-CM

## 2014-05-04 DIAGNOSIS — F0391 Unspecified dementia with behavioral disturbance: Secondary | ICD-10-CM

## 2014-05-04 DIAGNOSIS — J301 Allergic rhinitis due to pollen: Secondary | ICD-10-CM

## 2014-05-04 DIAGNOSIS — F03918 Unspecified dementia, unspecified severity, with other behavioral disturbance: Secondary | ICD-10-CM

## 2014-05-04 NOTE — Progress Notes (Signed)
Patient ID: Amanda [name], female   DOB: [birth year], 68 y.o.   MRN: [id number]    Nursing Home Location:  [facility]   Place of Service: SNF (5)  PCP: Kandice Hams, MD  No Known Allergies  Chief Complaint  Patient presents with  . Medical Management of Chronic Issues    HPI:  68 year old female with a PMH of renal insufficieny, dementia with behaviors, HTN and psychosis who is a long term resident of [facility] who is being seen today for routine follow up on chronic conditions, in the past month there has been no significant change in chronic conditions.  Labs revealed slightly worsening renal function; this is unchanged. Pt with good PO intake per staff. Compliant with drinking increased fluids. Pt has been off Zoloft for over a month. No worsening anxiety. Staff without concerns at this time    Review of Systems:  Unable to obtain due to dementia   Past Medical History  Diagnosis Date  . Hypertension   . Stroke ~ 2003    "slight memory loss" (05/18/2012)  . Gout     "?feet" (05/18/2012)  . Dementia     "alzheimer's" (05/18/2012)  . Incontinence of urine   . Wrist fracture, bilateral 05/17/2012    "fell down steps" (05/18/2012)   Past Surgical History  Procedure Laterality Date  . Vaginal hysterectomy     Social History:   reports that she has been smoking Cigarettes.  She has been smoking about 0.00 packs per day. She has never used smokeless tobacco. She reports that she drinks alcohol. She reports that she uses illicit drugs (Marijuana).  Family History  Problem Relation Age of Onset  . Dementia Mother   . Dementia Father   . Dementia Sister   . Dementia Brother     Medications: Patient's Medications  New Prescriptions   No medications on file  Previous Medications   AMBULATORY NON FORMULARY MEDICATION    Lorazepam 0.29m gel Sig: Apply 0.519mtopically every 12 hours as needed for anxiety   AMLODIPINE (NORVASC) 10 MG TABLET    Take 10  mg by mouth daily.   ATORVASTATIN (LIPITOR) 10 MG TABLET    Take 10 mg by mouth daily.   CLONIDINE (CATAPRES) 0.1 MG TABLET    Take 0.1 mg by mouth 3 (three) times daily.    DONEPEZIL (ARICEPT) 10 MG TABLET    Take 10 mg by mouth at bedtime.   HYDROCODONE-ACETAMINOPHEN (NORCO) 5-325 MG PER TABLET    Take one tablet by mouth every 6 hours as needed; Take two tablets by mouth every 6 hours as needed for pain   LABETALOL (NORMODYNE) 300 MG TABLET    Take 300 mg by mouth 2 (two) times daily.   LORATADINE (CLARITIN) 10 MG TABLET    Take 10 mg by mouth daily.   LORAZEPAM (ATIVAN) 1 MG TABLET    Take one tablet by mouth prior to dental procedure   LORAZEPAM (ATIVAN) 2 MG TABLET    Take one tablet by mouth prior to next dentist visit   MEMANTINE HCL ER (NAMENDA XR) 28 MG CP24    Take 28 mg by mouth daily.   MULTIPLE MINERALS-VITAMINS (CALCIUM & VIT D3 BONE HEALTH PO)    Take by mouth daily.  Modified Medications   No medications on file  Discontinued Medications   SERTRALINE (ZOLOFT) 50 MG TABLET    Take 25 mg by mouth daily.  Physical Exam:  Filed Vitals:   05/04/14 1602  BP: 117/74  Pulse: 69  Temp: 97 F (36.1 C)  Resp: 20  Weight: 128 lb (58.06 kg)   Physical Exam  Constitutional: She is well-developed, well-nourished, and in no distress.  Eyes: Conjunctivae are normal. Pupils are equal, round, and reactive to light.  Neck: Normal range of motion. Neck supple.  Cardiovascular: Normal rate, regular rhythm and normal heart sounds.   Pulmonary/Chest: Effort normal and breath sounds normal. No respiratory distress.  Abdominal: Soft. Bowel sounds are normal.  Musculoskeletal: Normal range of motion. She exhibits no edema.  Neurological: She is alert.  advanced dementia   Skin: Skin is warm and dry. She is not diaphoretic.  Psychiatric:  Can be agitated easily but able to be redirected      Labs reviewed: BMP with Estimated GFR  Result: 04/04/2013 11:33 AM ( Status: F )    Sodium 141 135-145 mEq/L SLN  Potassium 3.7 3.5-5.3 mEq/L SLN  Chloride 105 96-112 mEq/L SLN  CO2 24 19-32 mEq/L SLN  Glucose 81 70-99 mg/dL SLN  BUN 31 H 6-23 mg/dL SLN  Creatinine 1.34 H 0.50-1.10 mg/dL SLN  Calcium 9.4 8.4-10.5 mg/dL SLN  Est GFR, African American 47 L mL/min SLN  Est GFR, NonAfrican American 40 L mL/min SLN  Hemoglobin A1C  Result: 05/02/2013 6:07 PM ( Status: F )  Hemoglobin A1C 6.2 H <5.7 % SLN C  Estimated Average Glucose 131 H <117 mg/dL SLN  Vitamin D (25-Hydroxy)  Result: 05/03/2013 1:00 AM ( Status: F )  Vitamin D (25-Hydroxy) 45  Lipid Profile  Result: 06/19/2013 4:04 PM ( Status: F )  Cholesterol 148 0-200 mg/dL SLN C  Triglyceride 285 H <150 mg/dL SLN  HDL Cholesterol 36 L >39 mg/dL SLN  Total Chol/HDL Ratio 4.1 Ratio SLN  VLDL Cholesterol (Calc) 57 H 0-40 mg/dL SLN  LDL Cholesterol (Calc) 55 0-99 mg/dL SLN C  Liver Profile  Result: 06/19/2013 4:04 PM ( Status: F )  Bilirubin, Total 0.3 0.3-1.2 mg/dL SLN  Bilirubin, Direct <0.1 0.0-0.3 mg/dL SLN  Indirect Bilirubin NOT CALC 0.0-0.9 mg/dL SLN  Alkaline Phosphatase 77 39-117 U/L SLN  AST/SGOT 19 0-37 U/L SLN  ALT/SGPT 12 0-35 U/L SLN  Total Protein 7.3 6.0-8.3 g/dL SLN  Albumin 4.3  CBC with Diff  Result: 08/31/2013 11:26 PM ( Status: F ) C  WBC 6.9 4.0-10.5 K/uL SLN  RBC 4.04 3.87-5.11 MIL/uL SLN  Hemoglobin 12.3 12.0-15.0 g/dL SLN  Hematocrit 36.6 36.0-46.0 % SLN  MCV 90.6 78.0-100.0 fL SLN  MCH 30.4 26.0-34.0 pg SLN  MCHC 33.6 30.0-36.0 g/dL SLN  RDW 13.0 11.5-15.5 % SLN  Platelet Count 263 150-400 K/uL SLN  Granulocyte % 52 43-77 % SLN  Absolute Gran 3.6 1.7-7.7 K/uL SLN  Lymph % 37 12-46 % SLN  Absolute Lymph 2.6 0.7-4.0 K/uL SLN  Mono % 7 3-12 % SLN  Absolute Mono 0.5 0.1-1.0 K/uL SLN  Eos % 4 0-5 % SLN  Absolute Eos 0.3 0.0-0.7 K/uL SLN  Baso % 0 0-1 % SLN  Absolute Baso 0.0 0.0-0.1 K/uL SLN  Smear Review Criteria for review not met SLN  Comprehensive Metabolic Panel   Result: 3/55/7322 1:23 AM ( Status: F )  Sodium 142 135-145 mEq/L SLN  Potassium 3.9 3.5-5.3 mEq/L SLN  Chloride 105 96-112 mEq/L SLN  CO2 28 19-32 mEq/L SLN  Glucose 94 70-99 mg/dL SLN  BUN 25 H 6-23 mg/dL SLN  Creatinine 1.38 H 0.50-1.10  mg/dL SLN  Bilirubin, Total 0.4 0.3-1.2 mg/dL SLN  Alkaline Phosphatase 82 39-117 U/L SLN  AST/SGOT 20 0-37 U/L SLN  ALT/SGPT 17 0-35 U/L SLN  Total Protein 7.2 6.0-8.3 g/dL SLN  Albumin 4.1 3.5-5.2 g/dL SLN  Calcium 9.6 8.4-10.5 mg/dL SLN  Lipid Profile  Result: 09/21/2013 10:32 AM ( Status: F ) C  Cholesterol 133 0-200 mg/dL SLN C  Triglyceride 161 H <150 mg/dL SLN  HDL Cholesterol 37 L >39 mg/dL SLN  Total Chol/HDL Ratio 3.6 Ratio SLN  VLDL Cholesterol (Calc) 32 0-40 mg/dL SLN  LDL Cholesterol (Calc) 64 0-99 CBC with Diff    Result: 02/05/2014 2:26 PM   ( Status: F )       WBC 7.4     4.0-10.5 K/uL SLN   RBC 3.84   L 3.87-5.11 MIL/uL SLN   Hemoglobin 11.8   L 12.0-15.0 g/dL SLN   Hematocrit 34.2   L 36.0-46.0 % SLN   MCV 89.1     78.0-100.0 fL SLN   MCH 30.7     26.0-34.0 pg SLN   MCHC 34.5     30.0-36.0 g/dL SLN   RDW 13.3     11.5-15.5 % SLN   Platelet Count 304     150-400 K/uL SLN   Granulocyte % 55     43-77 % SLN   Absolute Gran 4.1     1.7-7.7 K/uL SLN   Lymph % 37     12-46 % SLN   Absolute Lymph 2.7     0.7-4.0 K/uL SLN   Mono % 6     3-12 % SLN   Absolute Mono 0.4     0.1-1.0 K/uL SLN   Eos % 2     0-5 % SLN   Absolute Eos 0.1     0.0-0.7 K/uL SLN   Baso % 0     0-1 % SLN   Absolute Baso 0.0     0.0-0.1 K/uL SLN   Smear Review Criteria for review not met  SLN   Comprehensive Metabolic Panel    Result: 02/05/2014 2:43 PM   ( Status: F )       Sodium 143     135-145 mEq/L SLN   Potassium 4.3     3.5-5.3 mEq/L SLN   Chloride 106     96-112 mEq/L SLN   CO2 25     19-32 mEq/L SLN   Glucose 98     70-99 mg/dL SLN   BUN 36   H 6-23 mg/dL SLN   Creatinine 1.84   H 0.50-1.10 mg/dL SLN   Bilirubin, Total 0.3      0.2-1.2 mg/dL SLN   Alkaline Phosphatase 80     39-117 U/L SLN   AST/SGOT 23     0-37 U/L SLN   ALT/SGPT 17     0-35 U/L SLN   Total Protein 7.0     6.0-8.3 g/dL SLN   Albumin 4.1     3.5-5.2 g/dL SLN   Calcium 9.3     8.4-10.5 mg/dL SLN   Vitamin D (25-Hydroxy)    Result: 02/06/2014 4:02 AM   ( Status: F )       Vitamin D (25-Hydroxy) 45     30-89 ng/mL SLN C Basic Metabolic Panel    Result: 03/15/2014 3:19 PM   ( Status: F )       Sodium 142     135-145 mEq/L SLN   Potassium 3.9  3.5-5.3 mEq/L SLN   Chloride 107     96-112 mEq/L SLN   CO2 26     19-32 mEq/L SLN   Glucose 93     70-99 mg/dL SLN   BUN 45   H 6-23 mg/dL SLN   Creatinine 2.11   H 0.50-1.10 mg/dL SLN   Calcium 9.4 Assessment/Plan   1. Essential hypertension, benign Patients hypertension is stable; continue current regimen. Will monitor and make changes as necessary.  2. Allergic rhinitis due to pollen conts on claritin  3. Dementia, unspecified, with behavioral disturbance Advanced but no recent changes in cognitive or functional status   4. Anxiety state, unspecified Stable off medications  5. Renal insufficiency Unchanged, staff reports good PO intake, will cont to follow

## 2014-05-24 ENCOUNTER — Non-Acute Institutional Stay (SKILLED_NURSING_FACILITY): Payer: Medicare Other | Admitting: Internal Medicine

## 2014-05-24 ENCOUNTER — Encounter: Payer: Self-pay | Admitting: Internal Medicine

## 2014-05-24 DIAGNOSIS — F0391 Unspecified dementia with behavioral disturbance: Secondary | ICD-10-CM

## 2014-05-24 DIAGNOSIS — E785 Hyperlipidemia, unspecified: Secondary | ICD-10-CM

## 2014-05-24 DIAGNOSIS — N183 Chronic kidney disease, stage 3 unspecified: Secondary | ICD-10-CM

## 2014-05-24 DIAGNOSIS — E559 Vitamin D deficiency, unspecified: Secondary | ICD-10-CM

## 2014-05-24 DIAGNOSIS — I10 Essential (primary) hypertension: Secondary | ICD-10-CM

## 2014-05-24 DIAGNOSIS — F03918 Unspecified dementia, unspecified severity, with other behavioral disturbance: Secondary | ICD-10-CM

## 2014-05-24 DIAGNOSIS — J301 Allergic rhinitis due to pollen: Secondary | ICD-10-CM

## 2014-05-24 NOTE — Assessment & Plan Note (Signed)
Continue claritin 10 mg daily

## 2014-05-24 NOTE — Assessment & Plan Note (Signed)
Pt without declines on aricept and namenda with prn ativan gel for behavoirs ythat are absent today

## 2014-05-24 NOTE — Assessment & Plan Note (Signed)
Control on multiple meds-labetolol, norvasc, and clonidine

## 2014-05-24 NOTE — Assessment & Plan Note (Signed)
09/2013  LDL  64, HDL 37 on lipitor 10mg 

## 2014-05-24 NOTE — Progress Notes (Signed)
MRN: [id number] Name: Amanda [name]  Sex: female Age: 68 y.o. DOB: [birth year]  [facility] Facility[facility]Room: 117A Level Of Care: SNF Provider: Merrilee SeashoreALEXANDER, Aylyn Wenzler D Emergency Contacts: Extended Emergency Contact Information Primary Emergency Contact: Wyne,[name] Address: [address]          [place], [place] [address] [place] Home Phone: [phone] Mobile Phone: [phone] Relation: Spouse Secondary Emergency Contact: Romeo AppleWilliamson,[name] J  [place] Mobile Phone: [phone] Relation: Son     Allergies: Review of patient's allergies indicates no known allergies.  Chief Complaint  Patient presents with  . Medical Management of Chronic Issues    HPI: Patient is 68 y.o. female who is being seen for routine problems.  Past Medical History  Diagnosis Date  . Hypertension   . Stroke ~ 2003    "slight memory loss" (05/18/2012)  . Gout     "?feet" (05/18/2012)  . Dementia     "alzheimer's" (05/18/2012)  . Incontinence of urine   . Wrist fracture, bilateral 05/17/2012    "fell down steps" (05/18/2012)    Past Surgical History  Procedure Laterality Date  . Vaginal hysterectomy        Medication List       This list is accurate as of: 05/24/14  7:48 PM.  Always use your most recent med list.               AMBULATORY NON FORMULARY MEDICATION  - Lorazepam 0.5mg  gel  - Sig: Apply 0.5mg  topically every 12 hours as needed for anxiety     amLODipine 10 MG tablet  Commonly known as:  NORVASC  Take 10 mg by mouth daily.     atorvastatin 10 MG tablet  Commonly known as:  LIPITOR  Take 10 mg by mouth daily.     CALCIUM & VIT D3 BONE HEALTH PO  Take by mouth daily.     cloNIDine 0.1 MG tablet  Commonly known as:  CATAPRES  Take 0.1 mg by mouth 3 (three) times daily.     donepezil 10 MG tablet  Commonly known as:  ARICEPT  Take 10 mg by mouth at bedtime.     HYDROcodone-acetaminophen 5-325 MG per tablet   Commonly known as:  NORCO  Take one tablet by mouth every 6 hours as needed; Take two tablets by mouth every 6 hours as needed for pain     labetalol 300 MG tablet  Commonly known as:  NORMODYNE  Take 300 mg by mouth 2 (two) times daily.     loratadine 10 MG tablet  Commonly known as:  CLARITIN  Take 10 mg by mouth daily.     LORazepam 1 MG tablet  Commonly known as:  ATIVAN  Take one tablet by mouth prior to dental procedure     LORazepam 2 MG tablet  Commonly known as:  ATIVAN  Take one tablet by mouth prior to next dentist visit     NAMENDA XR 28 MG Cp24  Generic drug:  Memantine HCl ER  Take 28 mg by mouth daily.        No orders of the defined types were placed in this encounter.    Immunization History  Administered Date(s) Administered  . Influenza-Unspecified 05/10/2013  . Pneumococcal-Unspecified 06/03/2012    History  Substance Use Topics  . Smoking status: Current Some Day Smoker    Types: Cigarettes  . Smokeless tobacco: Never Used     Comment: 05/18/2012 "used to smoke like a  fish; now smokes 1-2 cigarettes/wk; has smoked 30-40 years"  . Alcohol Use: Yes     Comment: 05/18/2012 "probably drank 3 beers/wk til maybe 2 yr ago"    Review of Systems  DATA OBTAINED: from patient, nurse; no c/o GENERAL:  no fevers, fatigue, appetite changes SKIN: No itching, rash HEENT: No complaint RESPIRATORY: No cough, wheezing, SOB CARDIAC: No chest pain, palpitations, lower extremity edema  GI: No abdominal pain, No N/V/D or constipation, No heartburn or reflux  GU: No dysuria, frequency or urgency, or incontinence  MUSCULOSKELETAL: No unrelieved bone/joint pain NEUROLOGIC: No headache, dizziness  PSYCHIATRIC: No overt anxiety or sadness  Filed Vitals:   05/24/14 1506  BP: 115/65  Pulse: 57  Temp: 97.7 F (36.5 C)  Resp: 18    Physical Exam  GENERAL APPEARANCE: Alert,mod conversant, very pleasantly demented BF SKIN: No diaphoresis rash HEENT:  Unremarkable RESPIRATORY: Breathing is even, unlabored. Lung sounds are clear   CARDIOVASCULAR: Heart RRR no murmurs, rubs or gallops. No peripheral edema  GASTROINTESTINAL: Abdomen is soft, non-tender, not distended w/ normal bowel sounds.  GENITOURINARY: Bladder non tender, not distended  MUSCULOSKELETAL: No abnormal joints or musculature NEUROLOGIC: Cranial nerves 2-12 grossly intact. Moves all extremities PSYCHIATRIC: Mood and affect appropriate to situation, no behavioral issues  Patient Active Problem List   Diagnosis Date Noted  . Allergic rhinitis 11/24/2013  . Hyperlipidemia 02/27/2013  . Psychosis 12/12/2012  . Anxiety state, unspecified 12/12/2012  . Vitamin D deficiency 12/12/2012  . Wrist fracture, bilateral 05/17/2012  . Fall 05/17/2012  . CKD (chronic kidney disease) stage 3, GFR 30-59 ml/min 05/17/2012  . Leukocytosis 05/17/2012  . Fracture of medial wall of orbit 05/17/2012  . Essential hypertension, benign   . Dementia with behavioral disturbance     CBC    Component Value Date/Time   WBC 8.3 05/18/2012 0939   RBC 4.02 05/18/2012 0939   HGB 11.6* 05/31/2012 0851   HCT 34.0* 05/31/2012 0851   PLT 180 05/18/2012 0939   MCV 89.3 05/18/2012 0939   LYMPHSABS 1.3 05/17/2012 1042   MONOABS 0.5 05/17/2012 1042   EOSABS 0.1 05/17/2012 1042   BASOSABS 0.0 05/17/2012 1042    CMP     Component Value Date/Time   NA 144 05/31/2012 0851   K 4.1 05/31/2012 0851   CL 106 05/31/2012 0851   CO2 23 05/18/2012 0939   GLUCOSE 86 05/31/2012 0851   BUN 39* 05/31/2012 0851   CREATININE 1.70* 05/31/2012 0851   CALCIUM 9.6 05/18/2012 0939   PROT 7.8 05/17/2012 1042   ALBUMIN 4.5 05/17/2012 1042   AST 33 05/17/2012 1042   ALT 24 05/17/2012 1042   ALKPHOS 84 05/17/2012 1042   BILITOT 0.5 05/17/2012 1042   GFRNONAA 50* 05/18/2012 0939   GFRAA 58* 05/18/2012 0939    Assessment and Plan  Dementia with behavioral disturbance Pt without declines on aricept and namenda with prn ativan  gel for behavoirs ythat are absent today  Essential hypertension, benign Control on multiple meds-labetolol, norvasc, and clonidine  CKD (chronic kidney disease) stage 3, GFR 30-59 ml/min BUN/Cr 45/2.11 in 03/2014 which appears to be progression of dx/baseline new  Hyperlipidemia 09/2013  LDL  64, HDL 37 on lipitor 10mg   Vitamin D deficiency Level 45 in 01/2014; can continue 1000 u daily  Allergic rhinitis Continue claritin 10 mg daily    Margit HanksALEXANDER, Tammra Pressman D, MD

## 2014-05-24 NOTE — Assessment & Plan Note (Signed)
BUN/Cr 45/2.11 in 03/2014 which appears to be progression of dx/baseline new

## 2014-05-24 NOTE — Assessment & Plan Note (Signed)
Level 45 in 01/2014; can continue 1000 u daily

## 2014-06-29 ENCOUNTER — Non-Acute Institutional Stay (SKILLED_NURSING_FACILITY): Payer: Medicare Other | Admitting: Nurse Practitioner

## 2014-06-29 DIAGNOSIS — N183 Chronic kidney disease, stage 3 unspecified: Secondary | ICD-10-CM

## 2014-06-29 DIAGNOSIS — F03918 Unspecified dementia, unspecified severity, with other behavioral disturbance: Secondary | ICD-10-CM

## 2014-06-29 DIAGNOSIS — J301 Allergic rhinitis due to pollen: Secondary | ICD-10-CM

## 2014-06-29 DIAGNOSIS — F0391 Unspecified dementia with behavioral disturbance: Secondary | ICD-10-CM

## 2014-06-29 DIAGNOSIS — I10 Essential (primary) hypertension: Secondary | ICD-10-CM

## 2014-06-29 NOTE — Progress Notes (Signed)
Patient ID: Amanda [name], female   DOB: January 22, 1946, 68 y.o.   MRN: [id number]    Nursing Home Location:  [facility]   Place of Service: SNF (14)  PCP: Kandice Hams, MD  No Known Allergies  Chief Complaint  Patient presents with  . Medical Management of Chronic Issues    HPI:  Patient is a 68 y.o. female seen today at [facility] for routine follow up on chronic conditions. Pt with a  PMH of renal insufficieny, dementia with behaviors, HTN and psychosis. Pt has been stable in the last month without acute issues noted. Staff has no concerns at this time. Pt conts to eat and drink well. No reports of constipation. Husband frequently comes and sits with pt at meal times.  Review of Systems:  Review of Systems  Unable to perform ROS: Dementia    Past Medical History  Diagnosis Date  . Hypertension   . Stroke ~ 2003    "slight memory loss" (05/18/2012)  . Gout     "?feet" (05/18/2012)  . Dementia     "alzheimer's" (05/18/2012)  . Incontinence of urine   . Wrist fracture, bilateral 05/17/2012    "fell down steps" (05/18/2012)   Past Surgical History  Procedure Laterality Date  . Vaginal hysterectomy     Social History:   reports that she has been smoking Cigarettes.  She has been smoking about 0.00 packs per day. She has never used smokeless tobacco. She reports that she drinks alcohol. She reports that she uses illicit drugs (Marijuana).  Family History  Problem Relation Age of Onset  . Dementia Mother   . Dementia Father   . Dementia Sister   . Dementia Brother     Medications: Patient's Medications  New Prescriptions   No medications on file  Previous Medications   AMBULATORY NON FORMULARY MEDICATION    Lorazepam 0.56m gel Sig: Apply 0.564mtopically every 12 hours as needed for anxiety   AMLODIPINE (NORVASC) 10 MG TABLET    Take 10 mg by mouth daily.   ATORVASTATIN (LIPITOR) 10 MG TABLET    Take 10 mg by mouth daily.   CLONIDINE (CATAPRES) 0.1 MG TABLET    Take 0.1 mg by mouth 3 (three) times daily.    DONEPEZIL (ARICEPT) 10 MG TABLET    Take 10 mg by mouth at bedtime.   HYDROCODONE-ACETAMINOPHEN (NORCO) 5-325 MG PER TABLET    Take one tablet by mouth every 6 hours as needed; Take two tablets by mouth every 6 hours as needed for pain   LABETALOL (NORMODYNE) 300 MG TABLET    Take 300 mg by mouth 2 (two) times daily.   LORATADINE (CLARITIN) 10 MG TABLET    Take 10 mg by mouth daily.   LORAZEPAM (ATIVAN) 1 MG TABLET    Take one tablet by mouth prior to dental procedure   LORAZEPAM (ATIVAN) 2 MG TABLET    Take one tablet by mouth prior to next dentist visit   MEMANTINE HCL ER (NAMENDA XR) 28 MG CP24    Take 28 mg by mouth daily.   MULTIPLE MINERALS-VITAMINS (CALCIUM & VIT D3 BONE HEALTH PO)    Take by mouth daily.  Modified Medications   No medications on file  Discontinued Medications   No medications on file     Physical Exam: Filed Vitals:   06/29/14 1618  BP: 108/63  Pulse: 88  Temp: 98.3 F (36.8 C)  Resp: 20  Physical Exam  Constitutional: She appears well-developed and well-nourished. No distress.  HENT:  Head: Normocephalic and atraumatic.  Mouth/Throat: Oropharynx is clear and moist. No oropharyngeal exudate.  Eyes: Conjunctivae are normal. Pupils are equal, round, and reactive to light.  Neck: Normal range of motion.  Cardiovascular: Normal rate, regular rhythm and normal heart sounds.   Pulmonary/Chest: Effort normal and breath sounds normal.  Abdominal: Soft. Bowel sounds are normal.  Musculoskeletal: She exhibits no edema or tenderness.  Neurological: She is alert.  Skin: Skin is warm and dry. She is not diaphoretic.  Psychiatric: She has a normal mood and affect.    Labs reviewed: Basic Metabolic Panel: No results for input(s): NA, K, CL, CO2, GLUCOSE, BUN, CREATININE, CALCIUM, MG, PHOS in the last 8760 hours. Liver Function Tests: No results for input(s): AST, ALT,  ALKPHOS, BILITOT, PROT, ALBUMIN in the last 8760 hours. No results for input(s): LIPASE, AMYLASE in the last 8760 hours. No results for input(s): AMMONIA in the last 8760 hours. CBC: No results for input(s): WBC, NEUTROABS, HGB, HCT, MCV, PLT in the last 8760 hours. TSH: No results for input(s): TSH in the last 8760 hours. A1C: No results found for: HGBA1C Lipid Panel: No results for input(s): CHOL, HDL, LDLCALC, TRIG, CHOLHDL, LDLDIRECT in the last 8760 hours.  Lipid Profile  Result: 06/19/2013 4:04 PM ( Status: F )  Cholesterol 148 0-200 mg/dL SLN C  Triglyceride 285 H <150 mg/dL SLN  HDL Cholesterol 36 L >39 mg/dL SLN  Total Chol/HDL Ratio 4.1 Ratio SLN  VLDL Cholesterol (Calc) 57 H 0-40 mg/dL SLN  LDL Cholesterol (Calc) 55 0-99 mg/dL SLN C  Liver Profile  Result: 06/19/2013 4:04 PM ( Status: F )  Bilirubin, Total 0.3 0.3-1.2 mg/dL SLN  Bilirubin, Direct <0.1 0.0-0.3 mg/dL SLN  Indirect Bilirubin NOT CALC 0.0-0.9 mg/dL SLN  Alkaline Phosphatase 77 39-117 U/L SLN  AST/SGOT 19 0-37 U/L SLN  ALT/SGPT 12 0-35 U/L SLN  Total Protein 7.3 6.0-8.3 g/dL SLN  Albumin 4.3  CBC with Diff  Result: 08/31/2013 11:26 PM ( Status: F ) C  WBC 6.9 4.0-10.5 K/uL SLN  RBC 4.04 3.87-5.11 MIL/uL SLN  Hemoglobin 12.3 12.0-15.0 g/dL SLN  Hematocrit 36.6 36.0-46.0 % SLN  MCV 90.6 78.0-100.0 fL SLN  MCH 30.4 26.0-34.0 pg SLN  MCHC 33.6 30.0-36.0 g/dL SLN  RDW 13.0 11.5-15.5 % SLN  Platelet Count 263 150-400 K/uL SLN  Granulocyte % 52 43-77 % SLN  Absolute Gran 3.6 1.7-7.7 K/uL SLN  Lymph % 37 12-46 % SLN  Absolute Lymph 2.6 0.7-4.0 K/uL SLN  Mono % 7 3-12 % SLN  Absolute Mono 0.5 0.1-1.0 K/uL SLN  Eos % 4 0-5 % SLN  Absolute Eos 0.3 0.0-0.7 K/uL SLN  Baso % 0 0-1 % SLN  Absolute Baso 0.0 0.0-0.1 K/uL SLN  Smear Review Criteria for review not met SLN  Comprehensive Metabolic Panel  Result: 8/33/8250 1:23 AM ( Status: F )  Sodium 142 135-145 mEq/L  SLN  Potassium 3.9 3.5-5.3 mEq/L SLN  Chloride 105 96-112 mEq/L SLN  CO2 28 19-32 mEq/L SLN  Glucose 94 70-99 mg/dL SLN  BUN 25 H 6-23 mg/dL SLN  Creatinine 1.38 H 0.50-1.10 mg/dL SLN  Bilirubin, Total 0.4 0.3-1.2 mg/dL SLN  Alkaline Phosphatase 82 39-117 U/L SLN  AST/SGOT 20 0-37 U/L SLN  ALT/SGPT 17 0-35 U/L SLN  Total Protein 7.2 6.0-8.3 g/dL SLN  Albumin 4.1 3.5-5.2 g/dL SLN  Calcium 9.6 8.4-10.5 mg/dL SLN  Lipid Profile  Result: 09/21/2013 10:32 AM ( Status: F ) C  Cholesterol 133 0-200 mg/dL SLN C  Triglyceride 161 H <150 mg/dL SLN  HDL Cholesterol 37 L >39 mg/dL SLN  Total Chol/HDL Ratio 3.6 Ratio SLN  VLDL Cholesterol (Calc) 32 0-40 mg/dL SLN  LDL Cholesterol (Calc) 64 0-99 CBC with Diff  Result: 02/05/2014 2:26 PM ( Status: F )   WBC7.4  4.0-10.5K/uLSLN  RBC3.84 L3.87-5.11MIL/uLSLN  Hemoglobin11.8 L12.0-15.0g/dLSLN  Hematocrit34.2 L36.0-46.0%SLN  MCV89.1  78.0-100.66fSLN  MCH30.7  26.0-34.0pgSLN  MCHC34.5  30.0-36.0g/dLSLN  RDW13.3  11.5-15.5%SLN  Platelet Count304  150-400K/uLSLN  Granulocyte %55  43-77%SLN  Absolute Gran4.1  1.7-7.7K/uLSLN  Lymph %37  12-46%SLN  Absolute Lymph2.7  0.7-4.0K/uLSLN  Mono %6   3-12%SLN  Absolute Mono0.4  0.1-1.0K/uLSLN  Eos %2  0-5%SLN  Absolute Eos0.1  0.0-0.7K/uLSLN  Baso %0  0-1%SLN  Absolute Baso0.0  0.0-0.1K/uLSLN  Smear ReviewCriteria for review not metSLN  Comprehensive Metabolic Panel  Result: 69/64/38382:43 PM ( Status: F )   Sodium143  135-1431m/LSLN  Potassium4.3  3.5-5.97m71mLSLN  Chloride106  96-112m32mSLN  CO225  19-32mE20mLN  Glucose98  70-99mg/65mN  BUN36 H6-297mg/d35m  Creatinine1.84 H0.50-1.10mg/dLSLN  Bilirubin, Total0.3  0.2-1.2mg/dLS26m Alkaline Phosphatase80  39-117U/LSLN  AST/SGOT23  0-37U/LSLN  ALT/SGPT17  0-35U/LSLN  Total Protein7.0  6.0-8.3g/dLSLN  Albumin4.1  3.5-5.2g/dLSLN  Calcium9.3  8.4-10.5mg/dLSL6mVitamin D (25-Hydroxy)  Result: 02/06/2014 4:02 AM ( Status: F )   Vitamin D (25-Hydroxy)45  30-89ng/m18-40RF/VOHKGOtabolic  Panel  Result: 03/15/2014 02/14/339( Status: F )   Sodium142  135-145mEq/LSL72motassium3.9  3.5-5.97mEq/LSLN 56mloride107  96-112mEq/LSLN 197m26  19-32mEq/LSLN  39mose93  70-99mg/dLSLN  B79m H6-297mg/dLSLN  Cr42mnine2.11 H0.50-1.10mg/dLSLN  Calcium9.4  Assessment/Plan 1. Allergic rhinitis due to pollen occ increase in nasal congestion, will cont Claritin.   2. Essential hypertension, benign Blood pressures stable at this time, will cont current regimen  3. Dementia with behavioral disturbance Stable at this time. No acute changes in cognitive or functional status.   4. CKD (chronic kidney disease) stage 3, GFR 30-59 ml/min Cr at 2.1, will cont to follow

## 2014-07-27 ENCOUNTER — Non-Acute Institutional Stay (SKILLED_NURSING_FACILITY): Payer: Medicare Other | Admitting: Nurse Practitioner

## 2014-07-27 DIAGNOSIS — J301 Allergic rhinitis due to pollen: Secondary | ICD-10-CM

## 2014-07-27 DIAGNOSIS — F411 Generalized anxiety disorder: Secondary | ICD-10-CM

## 2014-07-27 DIAGNOSIS — N183 Chronic kidney disease, stage 3 unspecified: Secondary | ICD-10-CM

## 2014-07-27 DIAGNOSIS — F0391 Unspecified dementia with behavioral disturbance: Secondary | ICD-10-CM

## 2014-07-27 DIAGNOSIS — F03918 Unspecified dementia, unspecified severity, with other behavioral disturbance: Secondary | ICD-10-CM

## 2014-07-27 DIAGNOSIS — E785 Hyperlipidemia, unspecified: Secondary | ICD-10-CM

## 2014-07-27 DIAGNOSIS — I10 Essential (primary) hypertension: Secondary | ICD-10-CM

## 2014-07-27 NOTE — Progress Notes (Signed)
Patient ID: Amanda [name], female   DOB: October 08, 1945, 68 y.o.   MRN: [id number]    Nursing Home Location:  [facility]   Place of Service: SNF (34)  PCP: Kandice Hams, MD  No Known Allergies  Chief Complaint  Patient presents with  . Medical Management of Chronic Issues    HPI:  Patient is a 68 y.o. female seen today at [facility] for routine follow up on chronic conditions. Pt with a  PMH of renal insufficieny, dementia with behaviors, HTN and psychosis. Pt has done well in the last month, dementia conts at baseline. conts to have nasal congestion off and on. Nursing without any concerns.   Review of Systems:  Review of Systems  Unable to perform ROS: Dementia    Past Medical History  Diagnosis Date  . Hypertension   . Stroke ~ 2003    "slight memory loss" (05/18/2012)  . Gout     "?feet" (05/18/2012)  . Dementia     "alzheimer's" (05/18/2012)  . Incontinence of urine   . Wrist fracture, bilateral 05/17/2012    "fell down steps" (05/18/2012)   Past Surgical History  Procedure Laterality Date  . Vaginal hysterectomy     Social History:   reports that she has been smoking Cigarettes.  She has been smoking about 0.00 packs per day. She has never used smokeless tobacco. She reports that she drinks alcohol. She reports that she uses illicit drugs (Marijuana).  Family History  Problem Relation Age of Onset  . Dementia Mother   . Dementia Father   . Dementia Sister   . Dementia Brother     Medications: Patient's Medications  New Prescriptions   No medications on file  Previous Medications   AMBULATORY NON FORMULARY MEDICATION    Lorazepam 0.46m gel Sig: Apply 0.557mtopically every 12 hours as needed for anxiety   AMLODIPINE (NORVASC) 10 MG TABLET    Take 10 mg by mouth daily.   ATORVASTATIN (LIPITOR) 10 MG TABLET    Take 10 mg by mouth daily.   CLONIDINE (CATAPRES) 0.1 MG TABLET    Take 0.1 mg by mouth 3 (three) times daily.    DONEPEZIL (ARICEPT) 10 MG TABLET    Take 10 mg by mouth at bedtime.   HYDROCODONE-ACETAMINOPHEN (NORCO) 5-325 MG PER TABLET    Take one tablet by mouth every 6 hours as needed; Take two tablets by mouth every 6 hours as needed for pain   LABETALOL (NORMODYNE) 300 MG TABLET    Take 300 mg by mouth 2 (two) times daily.   LORATADINE (CLARITIN) 10 MG TABLET    Take 10 mg by mouth daily.   LORAZEPAM (ATIVAN) 1 MG TABLET    Take one tablet by mouth prior to dental procedure   LORAZEPAM (ATIVAN) 2 MG TABLET    Take one tablet by mouth prior to next dentist visit   MEMANTINE HCL ER (NAMENDA XR) 28 MG CP24    Take 28 mg by mouth daily.   MULTIPLE MINERALS-VITAMINS (CALCIUM & VIT D3 BONE HEALTH PO)    Take by mouth daily.  Modified Medications   No medications on file  Discontinued Medications   No medications on file     Physical Exam: Filed Vitals:   07/27/14 1535  BP: 144/65  Pulse: 86  Temp: 97.5 F (36.4 C)  Resp: 20    Physical Exam  Constitutional: She appears well-developed and well-nourished. No distress.  HENT:  Head: Normocephalic and atraumatic.  Mouth/Throat: Oropharynx is clear and moist. No oropharyngeal exudate.  Eyes: Conjunctivae are normal. Pupils are equal, round, and reactive to light.  Neck: Normal range of motion.  Cardiovascular: Normal rate, regular rhythm and normal heart sounds.   Pulmonary/Chest: Effort normal and breath sounds normal.  Abdominal: Soft. Bowel sounds are normal.  Musculoskeletal: She exhibits no edema or tenderness.  Neurological: She is alert.  Skin: Skin is warm and dry. She is not diaphoretic.  Psychiatric: She has a normal mood and affect.    Labs reviewed: Basic Metabolic Panel: No results for input(s): NA, K, CL, CO2, GLUCOSE, BUN, CREATININE, CALCIUM, MG, PHOS in the last 8760 hours. Liver Function Tests: No results for input(s): AST, ALT, ALKPHOS, BILITOT, PROT, ALBUMIN in the last 8760 hours. No results for input(s): LIPASE,  AMYLASE in the last 8760 hours. No results for input(s): AMMONIA in the last 8760 hours. CBC: No results for input(s): WBC, NEUTROABS, HGB, HCT, MCV, PLT in the last 8760 hours. TSH: No results for input(s): TSH in the last 8760 hours. A1C: No results found for: HGBA1C Lipid Panel: No results for input(s): CHOL, HDL, LDLCALC, TRIG, CHOLHDL, LDLDIRECT in the last 8760 hours.  Lipid Profile  Result: 06/19/2013 4:04 PM ( Status: F )  Cholesterol 148 0-200 mg/dL SLN C  Triglyceride 285 H <150 mg/dL SLN  HDL Cholesterol 36 L >39 mg/dL SLN  Total Chol/HDL Ratio 4.1 Ratio SLN  VLDL Cholesterol (Calc) 57 H 0-40 mg/dL SLN  LDL Cholesterol (Calc) 55 0-99 mg/dL SLN C  Liver Profile  Result: 06/19/2013 4:04 PM ( Status: F )  Bilirubin, Total 0.3 0.3-1.2 mg/dL SLN  Bilirubin, Direct <0.1 0.0-0.3 mg/dL SLN  Indirect Bilirubin NOT CALC 0.0-0.9 mg/dL SLN  Alkaline Phosphatase 77 39-117 U/L SLN  AST/SGOT 19 0-37 U/L SLN  ALT/SGPT 12 0-35 U/L SLN  Total Protein 7.3 6.0-8.3 g/dL SLN  Albumin 4.3  CBC with Diff  Result: 08/31/2013 11:26 PM ( Status: F ) C  WBC 6.9 4.0-10.5 K/uL SLN  RBC 4.04 3.87-5.11 MIL/uL SLN  Hemoglobin 12.3 12.0-15.0 g/dL SLN  Hematocrit 36.6 36.0-46.0 % SLN  MCV 90.6 78.0-100.0 fL SLN  MCH 30.4 26.0-34.0 pg SLN  MCHC 33.6 30.0-36.0 g/dL SLN  RDW 13.0 11.5-15.5 % SLN  Platelet Count 263 150-400 K/uL SLN  Granulocyte % 52 43-77 % SLN  Absolute Gran 3.6 1.7-7.7 K/uL SLN  Lymph % 37 12-46 % SLN  Absolute Lymph 2.6 0.7-4.0 K/uL SLN  Mono % 7 3-12 % SLN  Absolute Mono 0.5 0.1-1.0 K/uL SLN  Eos % 4 0-5 % SLN  Absolute Eos 0.3 0.0-0.7 K/uL SLN  Baso % 0 0-1 % SLN  Absolute Baso 0.0 0.0-0.1 K/uL SLN  Smear Review Criteria for review not met SLN  Comprehensive Metabolic Panel  Result: 9/32/3557 1:23 AM ( Status: F )  Sodium 142 135-145 mEq/L SLN  Potassium 3.9 3.5-5.3 mEq/L SLN  Chloride 105 96-112 mEq/L SLN  CO2 28 19-32  mEq/L SLN  Glucose 94 70-99 mg/dL SLN  BUN 25 H 6-23 mg/dL SLN  Creatinine 1.38 H 0.50-1.10 mg/dL SLN  Bilirubin, Total 0.4 0.3-1.2 mg/dL SLN  Alkaline Phosphatase 82 39-117 U/L SLN  AST/SGOT 20 0-37 U/L SLN  ALT/SGPT 17 0-35 U/L SLN  Total Protein 7.2 6.0-8.3 g/dL SLN  Albumin 4.1 3.5-5.2 g/dL SLN  Calcium 9.6 8.4-10.5 mg/dL SLN  Lipid Profile  Result: 09/21/2013 10:32 AM ( Status: F ) C  Cholesterol 133 0-200 mg/dL  SLN C  Triglyceride 161 H <150 mg/dL SLN  HDL Cholesterol 37 L >39 mg/dL SLN  Total Chol/HDL Ratio 3.6 Ratio SLN  VLDL Cholesterol (Calc) 32 0-40 mg/dL SLN  LDL Cholesterol (Calc) 64 0-99 CBC with Diff  Result: 02/05/2014 2:26 PM ( Status: F )   WBC7.4  4.0-10.5K/uLSLN  RBC3.84 L3.87-5.11MIL/uLSLN  Hemoglobin11.8 L12.0-15.0g/dLSLN  Hematocrit34.2 L36.0-46.0%SLN  MCV89.1  78.0-100.85fSLN  MCH30.7  26.0-34.0pgSLN  MCHC34.5  30.0-36.0g/dLSLN  RDW13.3  11.5-15.5%SLN  Platelet Count304  150-400K/uLSLN  Granulocyte %55  43-77%SLN  Absolute Gran4.1  1.7-7.7K/uLSLN  Lymph %37  12-46%SLN  Absolute Lymph2.7  0.7-4.0K/uLSLN  Mono %6  3-12%SLN  Absolute Mono0.4   0.1-1.0K/uLSLN  Eos %2  0-5%SLN  Absolute Eos0.1  0.0-0.7K/uLSLN  Baso %0  0-1%SLN  Absolute Baso0.0  0.0-0.1K/uLSLN  Smear ReviewCriteria for review not metSLN  Comprehensive Metabolic Panel  Result: 65/32/99242:43 PM ( Status: F )   Sodium143  135-1480m/LSLN  Potassium4.3  3.5-5.52m77mLSLN  Chloride106  96-112m76mSLN  CO225  19-32mE1mLN  Glucose98  70-99mg/48mN  BUN36 H6-252mg/d48m  Creatinine1.84 H0.50-1.10mg/dLSLN  Bilirubin, Total0.3  0.2-1.2mg/dLS60m Alkaline Phosphatase80  39-117U/LSLN  AST/SGOT23  0-37U/LSLN  ALT/SGPT17  0-35U/LSLN  Total Protein7.0  6.0-8.3g/dLSLN  Albumin4.1  3.5-5.2g/dLSLN  Calcium9.3  8.4-10.5mg/dLSL68mVitamin D (25-Hydroxy)  Result: 02/06/2014 4:02 AM ( Status: F )   Vitamin D (25-Hydroxy)45  30-89ng/m26-83MH/DQQIWLtabolic Panel  Result: 03/15/2014 02/16/8920( Status: F )    Sodium142  135-145mEq/LSL56motassium3.9  3.5-5.52mEq/LSLN 94mloride107  96-112mEq/LSLN 652m26  19-32mEq/LSLN  8mose93  70-99mg/dLSLN  B552m H6-252mg/dLSLN  Cr37mnine2.11 H0.50-1.10mg/dLSLN  Calcium9.4  Lipid Panel    Result: 05/23/2014 11:35 AM   ( Status: F )     C Cholesterol 129     0-200 mg/dL SLN C Triglyceride 46     <150 mg/dL SLN   HDL Cholesterol 43     >39 mg/dL SLN   Total Chol/HDL Ratio 3.0      Ratio SLN   VLDL Cholesterol (Calc) 9     0-40 mg/dL SLN   LDL Cholesterol (Calc) 77     0-99 mg/dL SLN C  Assessment/Plan 1. Essential hypertension, benign -blood pressure controled on current regimen  2. Allergic rhinitis due to pollen Stable on claritin   3. Dementia with behavioral disturbance Without acute changes, conts on namenda  4. CKD (chronic kidney disease) stage 3, GFR 30-59 ml/min Will follow up renal function next month  5. Anxiety state Remains controlled without medicaition  6. Hyperlipidemia LDL at goal on recent labs, conts on lipitor

## 2014-08-01 ENCOUNTER — Encounter: Payer: Self-pay | Admitting: *Deleted

## 2014-08-21 ENCOUNTER — Non-Acute Institutional Stay (SKILLED_NURSING_FACILITY): Payer: Medicare Other | Admitting: Nurse Practitioner

## 2014-08-21 ENCOUNTER — Encounter: Payer: Self-pay | Admitting: Nurse Practitioner

## 2014-08-21 DIAGNOSIS — F0391 Unspecified dementia with behavioral disturbance: Secondary | ICD-10-CM

## 2014-08-21 DIAGNOSIS — I10 Essential (primary) hypertension: Secondary | ICD-10-CM

## 2014-08-21 DIAGNOSIS — F03918 Unspecified dementia, unspecified severity, with other behavioral disturbance: Secondary | ICD-10-CM

## 2014-08-21 DIAGNOSIS — E785 Hyperlipidemia, unspecified: Secondary | ICD-10-CM

## 2014-08-21 DIAGNOSIS — N183 Chronic kidney disease, stage 3 unspecified: Secondary | ICD-10-CM

## 2014-08-21 DIAGNOSIS — J301 Allergic rhinitis due to pollen: Secondary | ICD-10-CM

## 2014-08-21 DIAGNOSIS — F411 Generalized anxiety disorder: Secondary | ICD-10-CM

## 2014-08-21 NOTE — Progress Notes (Signed)
Patient ID: Amanda [name], female   DOB: 1946/01/29, 69 y.o.   MRN: [id number]    Nursing Home Location:  [facility]   Place of Service: SNF (23)  PCP: Kandice Hams, MD  No Known Allergies  Chief Complaint  Patient presents with  . Medical Management of Chronic Issues    HPI:  Patient is a 69 y.o. female seen today at [name] for routine follow up on chronic conditions. Pt with a  PMH of renal insufficieny, dementia with behaviors, HTN and psychosis.pt has been in her usual state of health over the last month. No acute issues noted. No worsening of mood or behaviors related to dementia. Nursing without acute concerns.   Review of Systems:  Review of Systems  Unable to perform ROS: Dementia    Past Medical History  Diagnosis Date  . Hypertension   . Stroke ~ 2003    "slight memory loss" (05/18/2012)  . Gout     "?feet" (05/18/2012)  . Dementia     "alzheimer's" (05/18/2012)  . Incontinence of urine   . Wrist fracture, bilateral 05/17/2012    "fell down steps" (05/18/2012)  . Fracture of medial wall of orbit 05/17/2012  . Fall 05/17/2012   Past Surgical History  Procedure Laterality Date  . Vaginal hysterectomy     Social History:   reports that she has been smoking Cigarettes.  She has been smoking about 0.00 packs per day. She has never used smokeless tobacco. She reports that she drinks alcohol. She reports that she uses illicit drugs (Marijuana).  Family History  Problem Relation Age of Onset  . Dementia Mother   . Dementia Father   . Dementia Sister   . Dementia Brother     Medications: Patient's Medications  New Prescriptions   No medications on file  Previous Medications   AMBULATORY NON FORMULARY MEDICATION    Lorazepam 0.70m gel Sig: Apply 0.541mtopically every 12 hours as needed for anxiety   AMLODIPINE (NORVASC) 10 MG TABLET    Take 10 mg by mouth daily.   ATORVASTATIN (LIPITOR) 10 MG TABLET    Take 10 mg by  mouth daily.   CLONIDINE (CATAPRES) 0.1 MG TABLET    Take 0.1 mg by mouth 3 (three) times daily.    DONEPEZIL (ARICEPT) 10 MG TABLET    Take 10 mg by mouth at bedtime.   HYDROCODONE-ACETAMINOPHEN (NORCO) 5-325 MG PER TABLET    Take one tablet by mouth every 6 hours as needed; Take two tablets by mouth every 6 hours as needed for pain   LABETALOL (NORMODYNE) 300 MG TABLET    Take 300 mg by mouth 2 (two) times daily.   LORATADINE (CLARITIN) 10 MG TABLET    Take 10 mg by mouth daily.   LORAZEPAM (ATIVAN) 1 MG TABLET    Take one tablet by mouth prior to dental procedure   LORAZEPAM (ATIVAN) 2 MG TABLET    Take one tablet by mouth prior to next dentist visit   MEMANTINE HCL ER (NAMENDA XR) 28 MG CP24    Take 28 mg by mouth daily.   MULTIPLE MINERALS-VITAMINS (CALCIUM & VIT D3 BONE HEALTH PO)    Take by mouth daily.  Modified Medications   No medications on file  Discontinued Medications   No medications on file     Physical Exam: Filed Vitals:   08/21/14 1603  BP: 122/52  Pulse: 80  Temp: 97 F (36.1 C)  Resp: 20  Weight: 136 lb (61.689 kg)    Physical Exam  Constitutional: She appears well-developed and well-nourished. No distress.  HENT:  Head: Normocephalic and atraumatic.  Mouth/Throat: Oropharynx is clear and moist. No oropharyngeal exudate.  Eyes: Conjunctivae are normal. Pupils are equal, round, and reactive to light.  Neck: Normal range of motion.  Cardiovascular: Normal rate, regular rhythm and normal heart sounds.   Pulmonary/Chest: Effort normal and breath sounds normal.  Abdominal: Soft. Bowel sounds are normal.  Musculoskeletal: She exhibits no edema or tenderness.  Neurological: She is alert.  Skin: Skin is warm and dry. She is not diaphoretic.  Psychiatric: She has a normal mood and affect.    Labs reviewed: Basic Metabolic Panel: No results for input(s): NA, K, CL, CO2, GLUCOSE, BUN, CREATININE, CALCIUM, MG, PHOS in the last 8760 hours. Liver Function  Tests: No results for input(s): AST, ALT, ALKPHOS, BILITOT, PROT, ALBUMIN in the last 8760 hours. No results for input(s): LIPASE, AMYLASE in the last 8760 hours. No results for input(s): AMMONIA in the last 8760 hours. CBC: No results for input(s): WBC, NEUTROABS, HGB, HCT, MCV, PLT in the last 8760 hours. TSH: No results for input(s): TSH in the last 8760 hours. A1C: No results found for: HGBA1C Lipid Panel: No results for input(s): CHOL, HDL, LDLCALC, TRIG, CHOLHDL, LDLDIRECT in the last 8760 hours.  Lipid Profile  Result: 06/19/2013 4:04 PM ( Status: F )  Cholesterol 148 0-200 mg/dL SLN C  Triglyceride 285 H <150 mg/dL SLN  HDL Cholesterol 36 L >39 mg/dL SLN  Total Chol/HDL Ratio 4.1 Ratio SLN  VLDL Cholesterol (Calc) 57 H 0-40 mg/dL SLN  LDL Cholesterol (Calc) 55 0-99 mg/dL SLN C  Liver Profile  Result: 06/19/2013 4:04 PM ( Status: F )  Bilirubin, Total 0.3 0.3-1.2 mg/dL SLN  Bilirubin, Direct <0.1 0.0-0.3 mg/dL SLN  Indirect Bilirubin NOT CALC 0.0-0.9 mg/dL SLN  Alkaline Phosphatase 77 39-117 U/L SLN  AST/SGOT 19 0-37 U/L SLN  ALT/SGPT 12 0-35 U/L SLN  Total Protein 7.3 6.0-8.3 g/dL SLN  Albumin 4.3  CBC with Diff  Result: 08/31/2013 11:26 PM ( Status: F ) C  WBC 6.9 4.0-10.5 K/uL SLN  RBC 4.04 3.87-5.11 MIL/uL SLN  Hemoglobin 12.3 12.0-15.0 g/dL SLN  Hematocrit 36.6 36.0-46.0 % SLN  MCV 90.6 78.0-100.0 fL SLN  MCH 30.4 26.0-34.0 pg SLN  MCHC 33.6 30.0-36.0 g/dL SLN  RDW 13.0 11.5-15.5 % SLN  Platelet Count 263 150-400 K/uL SLN  Granulocyte % 52 43-77 % SLN  Absolute Gran 3.6 1.7-7.7 K/uL SLN  Lymph % 37 12-46 % SLN  Absolute Lymph 2.6 0.7-4.0 K/uL SLN  Mono % 7 3-12 % SLN  Absolute Mono 0.5 0.1-1.0 K/uL SLN  Eos % 4 0-5 % SLN  Absolute Eos 0.3 0.0-0.7 K/uL SLN  Baso % 0 0-1 % SLN  Absolute Baso 0.0 0.0-0.1 K/uL SLN  Smear Review Criteria for review not met SLN  Comprehensive Metabolic Panel  Result: 11/16/8117 1:23 AM  ( Status: F )  Sodium 142 135-145 mEq/L SLN  Potassium 3.9 3.5-5.3 mEq/L SLN  Chloride 105 96-112 mEq/L SLN  CO2 28 19-32 mEq/L SLN  Glucose 94 70-99 mg/dL SLN  BUN 25 H 6-23 mg/dL SLN  Creatinine 1.38 H 0.50-1.10 mg/dL SLN  Bilirubin, Total 0.4 0.3-1.2 mg/dL SLN  Alkaline Phosphatase 82 39-117 U/L SLN  AST/SGOT 20 0-37 U/L SLN  ALT/SGPT 17 0-35 U/L SLN  Total Protein 7.2 6.0-8.3 g/dL SLN  Albumin 4.1 3.5-5.2 g/dL  SLN  Calcium 9.6 8.4-10.5 mg/dL SLN  Lipid Profile  Result: 09/21/2013 10:32 AM ( Status: F ) C  Cholesterol 133 0-200 mg/dL SLN C  Triglyceride 161 H <150 mg/dL SLN  HDL Cholesterol 37 L >39 mg/dL SLN  Total Chol/HDL Ratio 3.6 Ratio SLN  VLDL Cholesterol (Calc) 32 0-40 mg/dL SLN  LDL Cholesterol (Calc) 64 0-99 CBC with Diff  Result: 02/05/2014 2:26 PM ( Status: F )   WBC7.4  4.0-10.5K/uLSLN  RBC3.84 L3.87-5.11MIL/uLSLN  Hemoglobin11.8 L12.0-15.0g/dLSLN  Hematocrit34.2 L36.0-46.0%SLN  MCV89.1  78.0-100.6fSLN  MCH30.7  26.0-34.0pgSLN  MCHC34.5  30.0-36.0g/dLSLN  RDW13.3  11.5-15.5%SLN  Platelet Count304  150-400K/uLSLN  Granulocyte %55  43-77%SLN  Absolute Gran4.1  1.7-7.7K/uLSLN  Lymph %37  12-46%SLN  Absolute Lymph2.7  0.7-4.0K/uLSLN  Mono  %6  3-12%SLN  Absolute Mono0.4  0.1-1.0K/uLSLN  Eos %2  0-5%SLN  Absolute Eos0.1  0.0-0.7K/uLSLN  Baso %0  0-1%SLN  Absolute Baso0.0  0.0-0.1K/uLSLN  Smear ReviewCriteria for review not metSLN  Comprehensive Metabolic Panel  Result: 62/02/54272:43 PM ( Status: F )   Sodium143  135-1427m/LSLN  Potassium4.3  3.5-5.74m54mLSLN  Chloride106  96-112m57mSLN  CO225  19-32mE45mLN  Glucose98  70-99mg/74mN  BUN36 H6-274mg/d48m  Creatinine1.84 H0.50-1.10mg/dLSLN  Bilirubin, Total0.3  0.2-1.2mg/dLS71m Alkaline Phosphatase80  39-117U/LSLN  AST/SGOT23  0-37U/LSLN  ALT/SGPT17  0-35U/LSLN  Total Protein7.0  6.0-8.3g/dLSLN  Albumin4.1  3.5-5.2g/dLSLN  Calcium9.3  8.4-10.5mg/dLSL80mVitamin D (25-Hydroxy)  Result: 02/06/2014 4:02 AM ( Status: F )   Vitamin D (25-Hydroxy)45   30-89ng/m06-23JS/EGBTDVtabolic Panel  Result: 03/15/2014 02/12/1606( Status: F )   Sodium142  135-145mEq/LSL77motassium3.9  3.5-5.74mEq/LSLN 55mloride107  96-112mEq/LSLN 4m26  19-32mEq/LSLN  25mose93  70-99mg/dLSLN  B36m H6-274mg/dLSLN  Cr45mnine2.11 H0.50-1.10mg/dLSLN  Calcium9.4  Lipid Panel    Result: 05/23/2014 11:35 AM   ( Status: F )     C Cholesterol 129     0-200 mg/dL SLN C Triglyceride 46     <150 mg/dL SLN   HDL Cholesterol 43     >39 mg/dL SLN   Total Chol/HDL Ratio 3.0      Ratio SLN   VLDL Cholesterol (Calc) 9     0-40 mg/dL SLN   LDL Cholesterol (Calc) 77     0-99 mg/dL SLN C  Assessment/Plan  1. Essential hypertension, benign Currently controlled on catapres TID, Norvasc, labetalol,   2. Allergic rhinitis due to pollen conts on claritin   3. Dementia with behavioral disturbance Advanced dementia, conts on namenda and aricept Behaviors are controlled with out new behaviors noted Cognitive and functional status remains unchanged  4. CKD (chronic kidney disease) stage 3, GFR 30-59 ml/min Follow up bmp and cbc  5. Anxiety state Remains stable, with increase anxiety at times can be redirected. conts on have PRN lorazepam gel if needed  6. Hyperlipidemia LDL at goal on recent labs, conts on lipitor

## 2014-09-25 ENCOUNTER — Non-Acute Institutional Stay (SKILLED_NURSING_FACILITY): Payer: Medicare Other | Admitting: Nurse Practitioner

## 2014-09-25 DIAGNOSIS — F411 Generalized anxiety disorder: Secondary | ICD-10-CM

## 2014-09-25 DIAGNOSIS — N183 Chronic kidney disease, stage 3 unspecified: Secondary | ICD-10-CM

## 2014-09-25 DIAGNOSIS — J301 Allergic rhinitis due to pollen: Secondary | ICD-10-CM

## 2014-09-25 DIAGNOSIS — E785 Hyperlipidemia, unspecified: Secondary | ICD-10-CM

## 2014-09-25 DIAGNOSIS — F03918 Unspecified dementia, unspecified severity, with other behavioral disturbance: Secondary | ICD-10-CM

## 2014-09-25 DIAGNOSIS — B351 Tinea unguium: Secondary | ICD-10-CM | POA: Diagnosis not present

## 2014-09-25 DIAGNOSIS — F0391 Unspecified dementia with behavioral disturbance: Secondary | ICD-10-CM

## 2014-09-25 DIAGNOSIS — I10 Essential (primary) hypertension: Secondary | ICD-10-CM | POA: Diagnosis not present

## 2014-09-25 NOTE — Progress Notes (Signed)
Patient ID: Amanda [name], female   DOB: [birth year], 69 y.o.   MRN: [id number]    Nursing Home Location:  [facility]   Place of Service: SNF (74)  PCP: Kandice Hams, MD  No Known Allergies  Chief Complaint  Patient presents with  . Medical Management of Chronic Issues    HPI:  Patient is a 69 y.o. female seen today at [facility] for routine follow up on chronic conditions. Pt with a  PMH of renal insufficieny, dementia with behaviors, HTN and psychosis.pt has been in her usual state of health over the last month. Nursing without concerns. Pt reports pain in her right leg.   Review of Systems:  Review of Systems  Unable to perform ROS: Dementia    Past Medical History  Diagnosis Date  . Hypertension   . Stroke ~ 2003    "slight memory loss" (05/18/2012)  . Gout     "?feet" (05/18/2012)  . Dementia     "alzheimer's" (05/18/2012)  . Incontinence of urine   . Wrist fracture, bilateral 05/17/2012    "fell down steps" (05/18/2012)  . Fracture of medial wall of orbit 05/17/2012  . Fall 05/17/2012   Past Surgical History  Procedure Laterality Date  . Vaginal hysterectomy     Social History:   reports that she has been smoking Cigarettes.  She has never used smokeless tobacco. She reports that she drinks alcohol. She reports that she uses illicit drugs (Marijuana).  Family History  Problem Relation Age of Onset  . Dementia Mother   . Dementia Father   . Dementia Sister   . Dementia Brother     Medications: Patient's Medications  New Prescriptions   No medications on file  Previous Medications   AMBULATORY NON FORMULARY MEDICATION    Lorazepam 0.4m gel Sig: Apply 0.559mtopically every 12 hours as needed for anxiety   AMLODIPINE (NORVASC) 10 MG TABLET    Take 10 mg by mouth daily.   ATORVASTATIN (LIPITOR) 10 MG TABLET    Take 10 mg by mouth daily.   CLONIDINE (CATAPRES) 0.1 MG TABLET    Take 0.1 mg by mouth 3 (three) times daily.     DONEPEZIL (ARICEPT) 10 MG TABLET    Take 10 mg by mouth at bedtime.   HYDROCODONE-ACETAMINOPHEN (NORCO) 5-325 MG PER TABLET    Take one tablet by mouth every 6 hours as needed; Take two tablets by mouth every 6 hours as needed for pain   LABETALOL (NORMODYNE) 300 MG TABLET    Take 300 mg by mouth 2 (two) times daily.   LORATADINE (CLARITIN) 10 MG TABLET    Take 10 mg by mouth daily.   LORAZEPAM (ATIVAN) 2 MG TABLET    Take one tablet by mouth prior to next dentist visit   MEMANTINE HCL ER (NAMENDA XR) 28 MG CP24    Take 28 mg by mouth daily.   MULTIPLE MINERALS-VITAMINS (CALCIUM & VIT D3 BONE HEALTH PO)    Take by mouth daily.  Modified Medications   No medications on file  Discontinued Medications   No medications on file     Physical Exam: Filed Vitals:   09/25/14 1602  BP: 109/72  Pulse: 55  Temp: 96.8 F (36 C)  Resp: 19  Weight: 136 lb (61.689 kg)    Physical Exam  Constitutional: She appears well-developed and well-nourished. No distress.  HENT:  Head: Normocephalic and atraumatic.  Mouth/Throat: Oropharynx is clear and  moist. No oropharyngeal exudate.  Eyes: Conjunctivae are normal. Pupils are equal, round, and reactive to light.  Neck: Normal range of motion.  Cardiovascular: Normal rate, regular rhythm and normal heart sounds.   Pulmonary/Chest: Effort normal and breath sounds normal.  Abdominal: Soft. Bowel sounds are normal.  Musculoskeletal: She exhibits no edema or tenderness.  Neurological: She is alert.  Skin: Skin is warm and dry. She is not diaphoretic.  Toenail fungus with surrounding tissues red and tender.   Psychiatric: She has a normal mood and affect.    Labs reviewed: Basic Metabolic Panel: No results for input(s): NA, K, CL, CO2, GLUCOSE, BUN, CREATININE, CALCIUM, MG, PHOS in the last 8760 hours. Liver Function Tests: No results for input(s): AST, ALT, ALKPHOS, BILITOT, PROT, ALBUMIN in the last 8760 hours. No results for input(s): LIPASE,  AMYLASE in the last 8760 hours. No results for input(s): AMMONIA in the last 8760 hours. CBC: No results for input(s): WBC, NEUTROABS, HGB, HCT, MCV, PLT in the last 8760 hours. TSH: No results for input(s): TSH in the last 8760 hours. A1C: No results found for: HGBA1C Lipid Panel: No results for input(s): CHOL, HDL, LDLCALC, TRIG, CHOLHDL, LDLDIRECT in the last 8760 hours.  Lipid Profile  Result: 06/19/2013 4:04 PM ( Status: F )  Cholesterol 148 0-200 mg/dL SLN C  Triglyceride 285 H <150 mg/dL SLN  HDL Cholesterol 36 L >39 mg/dL SLN  Total Chol/HDL Ratio 4.1 Ratio SLN  VLDL Cholesterol (Calc) 57 H 0-40 mg/dL SLN  LDL Cholesterol (Calc) 55 0-99 mg/dL SLN C  Liver Profile  Result: 06/19/2013 4:04 PM ( Status: F )  Bilirubin, Total 0.3 0.3-1.2 mg/dL SLN  Bilirubin, Direct <0.1 0.0-0.3 mg/dL SLN  Indirect Bilirubin NOT CALC 0.0-0.9 mg/dL SLN  Alkaline Phosphatase 77 39-117 U/L SLN  AST/SGOT 19 0-37 U/L SLN  ALT/SGPT 12 0-35 U/L SLN  Total Protein 7.3 6.0-8.3 g/dL SLN  Albumin 4.3  CBC with Diff  Result: 08/31/2013 11:26 PM ( Status: F ) C  WBC 6.9 4.0-10.5 K/uL SLN  RBC 4.04 3.87-5.11 MIL/uL SLN  Hemoglobin 12.3 12.0-15.0 g/dL SLN  Hematocrit 36.6 36.0-46.0 % SLN  MCV 90.6 78.0-100.0 fL SLN  MCH 30.4 26.0-34.0 pg SLN  MCHC 33.6 30.0-36.0 g/dL SLN  RDW 13.0 11.5-15.5 % SLN  Platelet Count 263 150-400 K/uL SLN  Granulocyte % 52 43-77 % SLN  Absolute Gran 3.6 1.7-7.7 K/uL SLN  Lymph % 37 12-46 % SLN  Absolute Lymph 2.6 0.7-4.0 K/uL SLN  Mono % 7 3-12 % SLN  Absolute Mono 0.5 0.1-1.0 K/uL SLN  Eos % 4 0-5 % SLN  Absolute Eos 0.3 0.0-0.7 K/uL SLN  Baso % 0 0-1 % SLN  Absolute Baso 0.0 0.0-0.1 K/uL SLN  Smear Review Criteria for review not met SLN  Comprehensive Metabolic Panel  Result: 0/16/0109 1:23 AM ( Status: F )  Sodium 142 135-145 mEq/L SLN  Potassium 3.9 3.5-5.3 mEq/L SLN  Chloride 105 96-112 mEq/L SLN  CO2 28 19-32  mEq/L SLN  Glucose 94 70-99 mg/dL SLN  BUN 25 H 6-23 mg/dL SLN  Creatinine 1.38 H 0.50-1.10 mg/dL SLN  Bilirubin, Total 0.4 0.3-1.2 mg/dL SLN  Alkaline Phosphatase 82 39-117 U/L SLN  AST/SGOT 20 0-37 U/L SLN  ALT/SGPT 17 0-35 U/L SLN  Total Protein 7.2 6.0-8.3 g/dL SLN  Albumin 4.1 3.5-5.2 g/dL SLN  Calcium 9.6 8.4-10.5 mg/dL SLN  Lipid Profile  Result: 09/21/2013 10:32 AM ( Status: F ) C  Cholesterol 133 0-200 mg/dL  SLN C  Triglyceride 161 H <150 mg/dL SLN  HDL Cholesterol 37 L >39 mg/dL SLN  Total Chol/HDL Ratio 3.6 Ratio SLN  VLDL Cholesterol (Calc) 32 0-40 mg/dL SLN  LDL Cholesterol (Calc) 64 0-99 CBC with Diff  Result: 02/05/2014 2:26 PM ( Status: F )   WBC7.4  4.0-10.5K/uLSLN  RBC3.84 L3.87-5.11MIL/uLSLN  Hemoglobin11.8 L12.0-15.0g/dLSLN  Hematocrit34.2 L36.0-46.0%SLN  MCV89.1  78.0-100.1fSLN  MCH30.7  26.0-34.0pgSLN  MCHC34.5  30.0-36.0g/dLSLN  RDW13.3  11.5-15.5%SLN  Platelet Count304  150-400K/uLSLN  Granulocyte %55  43-77%SLN  Absolute Gran4.1  1.7-7.7K/uLSLN  Lymph %37  12-46%SLN  Absolute Lymph2.7  0.7-4.0K/uLSLN  Mono %6  3-12%SLN  Absolute Mono0.4   0.1-1.0K/uLSLN  Eos %2  0-5%SLN  Absolute Eos0.1  0.0-0.7K/uLSLN  Baso %0  0-1%SLN  Absolute Baso0.0  0.0-0.1K/uLSLN  Smear ReviewCriteria for review not metSLN  Comprehensive Metabolic Panel  Result: 65/85/27782:43 PM ( Status: F )   Sodium143  135-1442m/LSLN  Potassium4.3  3.5-5.42m37mLSLN  Chloride106  96-112m25mSLN  CO225  19-32mE74mLN  Glucose98  70-99mg/242mN  BUN36 H6-242mg/d1m  Creatinine1.84 H0.50-1.10mg/dLSLN  Bilirubin, Total0.3  0.2-1.2mg/dLS20m Alkaline Phosphatase80  39-117U/LSLN  AST/SGOT23  0-37U/LSLN  ALT/SGPT17  0-35U/LSLN  Total Protein7.0  6.0-8.3g/dLSLN  Albumin4.1  3.5-5.2g/dLSLN  Calcium9.3  8.4-10.5mg/dLSL67mVitamin D (25-Hydroxy)  Result: 02/06/2014 4:02 AM ( Status: F )   Vitamin D (25-Hydroxy)45  30-89ng/m24-23NT/IRWERXtabolic Panel  Result: 03/15/2014 12/11/84( Status: F )    Sodium142  135-145mEq/LSL41motassium3.9  3.5-5.42mEq/LSLN 26mloride107  96-112mEq/LSLN 99m26  19-32mEq/LSLN  1mose93  70-99mg/dLSLN  B81m H6-242mg/dLSLN  Cr87mnine2.11 H0.50-1.10mg/dLSLN  Calcium9.4  Lipid Panel    Result: 05/23/2014 11:35 AM   ( Status: F )     C Cholesterol 129     0-200 mg/dL SLN C Triglyceride 46     <150 mg/dL SLN   HDL Cholesterol 43     >39 mg/dL SLN   Total Chol/HDL Ratio 3.0      Ratio SLN   VLDL Cholesterol (Calc) 9     0-40 mg/dL SLN   LDL Cholesterol (Calc) 77     0-99 mg/dL SLN C  Assessment/Plan  1. Essential hypertension, benign Currently controlled on multiple medications, conts on catapres TID, Norvasc, labetalol,   2. Dementia with behavioral disturbance Unchanged, no acute changes in function or cognitive status. conts on namenda and aricept.   3. Allergic rhinitis due to pollen conts claritin 10 mg daily   4. Anxiety state stable, conts on have PRN lorazepam gel if needed  5. Hyperlipidemia Will get CMP, conts on Lipitor   6. Tinea unguium Will get podiatry consult. Warm water epsom salt soaks TID  7. CKD (chronic kidney disease) stage 3, GFR 30-59 ml/min cmp and cbc were not done last month, rewritten

## 2014-10-08 ENCOUNTER — Encounter: Payer: Self-pay | Admitting: Internal Medicine

## 2014-10-08 ENCOUNTER — Non-Acute Institutional Stay (SKILLED_NURSING_FACILITY): Payer: Medicare Other | Admitting: Internal Medicine

## 2014-10-08 DIAGNOSIS — E559 Vitamin D deficiency, unspecified: Secondary | ICD-10-CM | POA: Diagnosis not present

## 2014-10-08 DIAGNOSIS — N183 Chronic kidney disease, stage 3 unspecified: Secondary | ICD-10-CM

## 2014-10-08 DIAGNOSIS — D638 Anemia in other chronic diseases classified elsewhere: Secondary | ICD-10-CM | POA: Diagnosis not present

## 2014-10-08 NOTE — Progress Notes (Signed)
MRN: [id number] Name: Amanda [name]  Sex: female Age: 69 y.o. DOB: Jul 19, 1946  PSC #: Sonny Dandy Facility/Room: 117a Level Of Care: SNF Provider: Merrilee Seashore D Emergency Contacts: Extended Emergency Contact Information Primary Emergency Contact: Chicoine,[name] Address: [address]          [place], [place] [address] [place] Home Phone: [phone] Mobile Phone: [phone] Relation: Spouse Secondary Emergency Contact: Romeo [place] Mobile Phone: [phone] Relation: Son  Code Status:FULL   Allergies: Review of patient's allergies indicates no known allergies.  Chief Complaint  Patient presents with  . Medical Management of Chronic Issues    HPI: Patient is 69 y.o. female who is being seen for new labs and review of those problems.  Past Medical History  Diagnosis Date  . Hypertension   . Stroke ~ 2003    "slight memory loss" (05/18/2012)  . Gout     "?feet" (05/18/2012)  . Dementia     "alzheimer's" (05/18/2012)  . Incontinence of urine   . Wrist fracture, bilateral 05/17/2012    "fell down steps" (05/18/2012)  . Fracture of medial wall of orbit 05/17/2012  . Fall 05/17/2012    Past Surgical History  Procedure Laterality Date  . Vaginal hysterectomy        Medication List       This list is accurate as of: 10/08/14  1:37 PM.  Always use your most recent med list.               AMBULATORY NON FORMULARY MEDICATION  - Lorazepam 0.5mg  gel  - Sig: Apply 0.5mg  topically every 12 hours as needed for anxiety     amLODipine 10 MG tablet  Commonly known as:  NORVASC  Take 10 mg by mouth daily.     atorvastatin 10 MG tablet  Commonly known as:  LIPITOR  Take 10 mg by mouth daily.     CALCIUM & VIT D3 BONE HEALTH PO  Take by mouth daily.     cloNIDine 0.1 MG tablet  Commonly known as:  CATAPRES  Take 0.1 mg by mouth 3 (three) times daily.     donepezil 10 MG tablet  Commonly known as:   ARICEPT  Take 10 mg by mouth at bedtime.     HYDROcodone-acetaminophen 5-325 MG per tablet  Commonly known as:  NORCO  Take one tablet by mouth every 6 hours as needed; Take two tablets by mouth every 6 hours as needed for pain     labetalol 300 MG tablet  Commonly known as:  NORMODYNE  Take 300 mg by mouth 2 (two) times daily.     loratadine 10 MG tablet  Commonly known as:  CLARITIN  Take 10 mg by mouth daily.     LORazepam 2 MG tablet  Commonly known as:  ATIVAN  Take one tablet by mouth prior to next dentist visit     NAMENDA XR 28 MG Cp24 24 hr capsule  Generic drug:  memantine  Take 28 mg by mouth daily.        No orders of the defined types were placed in this encounter.    Immunization History  Administered Date(s) Administered  . Influenza-Unspecified 05/10/2013, 05/11/2014  . Pneumococcal-Unspecified 06/03/2012    History  Substance Use Topics  . Smoking status: Current Some Day Smoker    Types: Cigarettes  . Smokeless tobacco: Never Used     Comment: 05/18/2012 "used to smoke like a fish; now smokes  1-2 cigarettes/wk; has smoked 30-40 years"  . Alcohol Use: Yes     Comment: 05/18/2012 "probably drank 3 beers/wk til maybe 2 yr ago"    Review of Systems  DATA OBTAINED: from patient; no c/o -not reliable 2/2 dementia;nurse without concerns GENERAL:  no fevers, fatigue, appetite changes SKIN: No itching, rash HEENT: No complaint RESPIRATORY: No cough, wheezing, SOB CARDIAC: No chest pain, palpitations, lower extremity edema  GI: No abdominal pain, No N/V/D or constipation, No heartburn or reflux  GU: No dysuria, frequency or urgency, or incontinence  MUSCULOSKELETAL: No unrelieved bone/joint pain NEUROLOGIC: No headache, dizziness  PSYCHIATRIC: No overt anxiety or sadness  Filed Vitals:   10/08/14 1323  BP: 117/73  Pulse: 53  Temp: 97.6 F (36.4 C)  Resp: 18    Physical Exam  GENERAL APPEARANCE: Alert, BF No acute distress  SKIN: No  diaphoresis rash HEENT: Unremarkable RESPIRATORY: Breathing is even, unlabored. Lung sounds are clear   CARDIOVASCULAR: Heart RRR no murmurs, rubs or gallops. No peripheral edema  GASTROINTESTINAL: Abdomen is soft, non-tender, not distended w/ normal bowel sounds.  GENITOURINARY: Bladder non tender, not distended  MUSCULOSKELETAL: No abnormal joints or musculature NEUROLOGIC: Cranial nerves 2-12 grossly intact PSYCHIATRIC: dementia, no behavioral issues  Patient Active Problem List   Diagnosis Date Noted  . Anemia of chronic disease 10/08/2014  . Allergic rhinitis 11/24/2013  . Hyperlipidemia 02/27/2013  . Psychosis 12/12/2012  . Anxiety state 12/12/2012  . Vitamin D deficiency 12/12/2012  . CKD (chronic kidney disease) stage 3, GFR 30-59 ml/min 05/17/2012  . Essential hypertension, benign   . Dementia with behavioral disturbance     CBC    Component Value Date/Time   WBC 8.3 05/18/2012 0939   RBC 4.02 05/18/2012 0939   HGB 11.6* 05/31/2012 0851   HCT 34.0* 05/31/2012 0851   PLT 180 05/18/2012 0939   MCV 89.3 05/18/2012 0939   LYMPHSABS 1.3 05/17/2012 1042   MONOABS 0.5 05/17/2012 1042   EOSABS 0.1 05/17/2012 1042   BASOSABS 0.0 05/17/2012 1042    CMP     Component Value Date/Time   NA 144 05/31/2012 0851   K 4.1 05/31/2012 0851   CL 106 05/31/2012 0851   CO2 23 05/18/2012 0939   GLUCOSE 86 05/31/2012 0851   BUN 39* 05/31/2012 0851   CREATININE 1.70* 05/31/2012 0851   CALCIUM 9.6 05/18/2012 0939   PROT 7.8 05/17/2012 1042   ALBUMIN 4.5 05/17/2012 1042   AST 33 05/17/2012 1042   ALT 24 05/17/2012 1042   ALKPHOS 84 05/17/2012 1042   BILITOT 0.5 05/17/2012 1042   GFRNONAA 50* 05/18/2012 0939   GFRAA 58* 05/18/2012 0939    Assessment and Plan  Vitamin D deficiency Vit D on 2/18 was 23, worse than prior. Pt was started on 50,000U weekly for 12 weeks then back to daily 1000 units.   CKD (chronic kidney disease) stage 3, GFR 30-59 ml/min On 2/16  BUN  32/Cr  1.78 which is baseline for pt; will continue to monitor.   Anemia of chronic disease On 2/16 Hb 11.3/ HCT 33.2 MCV 88.3 which is exactly baseline for her     Margit HanksALEXANDER, Evelyna Folker D, MD

## 2014-10-08 NOTE — Assessment & Plan Note (Signed)
Vit D on 2/18 was 23, worse than prior. Pt was started on 50,000U weekly for 12 weeks then back to daily 1000 units.

## 2014-10-08 NOTE — Assessment & Plan Note (Signed)
On 2/16  BUN  32/Cr 1.78 which is baseline for pt; will continue to monitor.

## 2014-10-08 NOTE — Assessment & Plan Note (Signed)
On 2/16 Hb 11.3/ HCT 33.2 MCV 88.3 which is exactly baseline for her

## 2014-11-06 ENCOUNTER — Non-Acute Institutional Stay (SKILLED_NURSING_FACILITY): Payer: Medicare Other | Admitting: Nurse Practitioner

## 2014-11-06 DIAGNOSIS — J301 Allergic rhinitis due to pollen: Secondary | ICD-10-CM

## 2014-11-06 DIAGNOSIS — I10 Essential (primary) hypertension: Secondary | ICD-10-CM

## 2014-11-06 DIAGNOSIS — F411 Generalized anxiety disorder: Secondary | ICD-10-CM | POA: Diagnosis not present

## 2014-11-06 DIAGNOSIS — N183 Chronic kidney disease, stage 3 unspecified: Secondary | ICD-10-CM

## 2014-11-06 DIAGNOSIS — F0391 Unspecified dementia with behavioral disturbance: Secondary | ICD-10-CM | POA: Diagnosis not present

## 2014-11-06 DIAGNOSIS — E559 Vitamin D deficiency, unspecified: Secondary | ICD-10-CM

## 2014-11-06 DIAGNOSIS — D638 Anemia in other chronic diseases classified elsewhere: Secondary | ICD-10-CM

## 2014-11-06 DIAGNOSIS — F03918 Unspecified dementia, unspecified severity, with other behavioral disturbance: Secondary | ICD-10-CM

## 2014-11-06 NOTE — Progress Notes (Signed)
Patient ID: Amanda [name], female   DOB: 1946-04-26, 69 y.o.   MRN: [id number]    Nursing Home Location:  [facility]   Place of Service: SNF (45)  PCP: Kandice Hams, MD  No Known Allergies  Chief Complaint  Patient presents with  . Medical Management of Chronic Issues    HPI:  Patient is a 69 y.o. female seen today at [facility] for routine follow up on chronic conditions. Pt with a  PMH of renal insufficieny, dementia with behaviors, HTN and psychosis.pt has been in her usual state of health over the last month no acute issues. Vit D 50000 units was started after low vit D level. No concerns per nursing.   Review of Systems:  Review of Systems  Unable to perform ROS: Dementia    Past Medical History  Diagnosis Date  . Hypertension   . Stroke ~ 2003    "slight memory loss" (05/18/2012)  . Gout     "?feet" (05/18/2012)  . Dementia     "alzheimer's" (05/18/2012)  . Incontinence of urine   . Wrist fracture, bilateral 05/17/2012    "fell down steps" (05/18/2012)  . Fracture of medial wall of orbit 05/17/2012  . Fall 05/17/2012   Past Surgical History  Procedure Laterality Date  . Vaginal hysterectomy     Social History:   reports that she has been smoking Cigarettes.  She has never used smokeless tobacco. She reports that she drinks alcohol. She reports that she uses illicit drugs (Marijuana).  Family History  Problem Relation Age of Onset  . Dementia Mother   . Dementia Father   . Dementia Sister   . Dementia Brother     Medications: Patient's Medications  New Prescriptions   No medications on file  Previous Medications   AMBULATORY NON FORMULARY MEDICATION    Lorazepam 0.34m gel Sig: Apply 0.573mtopically every 12 hours as needed for anxiety   AMLODIPINE (NORVASC) 10 MG TABLET    Take 10 mg by mouth daily.   ATORVASTATIN (LIPITOR) 10 MG TABLET    Take 10 mg by mouth daily.   CLONIDINE (CATAPRES) 0.1 MG TABLET    Take 0.1  mg by mouth 3 (three) times daily.    DONEPEZIL (ARICEPT) 10 MG TABLET    Take 10 mg by mouth at bedtime.   HYDROCODONE-ACETAMINOPHEN (NORCO) 5-325 MG PER TABLET    Take one tablet by mouth every 6 hours as needed; Take two tablets by mouth every 6 hours as needed for pain   LABETALOL (NORMODYNE) 300 MG TABLET    Take 300 mg by mouth 2 (two) times daily.   LORATADINE (CLARITIN) 10 MG TABLET    Take 10 mg by mouth daily.   LORAZEPAM (ATIVAN) 2 MG TABLET    Take one tablet by mouth prior to next dentist visit   MEMANTINE HCL ER (NAMENDA XR) 28 MG CP24    Take 28 mg by mouth daily.   MULTIPLE MINERALS-VITAMINS (CALCIUM & VIT D3 BONE HEALTH PO)    Take by mouth daily.   VITAMIN D, ERGOCALCIFEROL, (DRISDOL) 50000 UNITS CAPS CAPSULE    Take 50,000 Units by mouth every 7 (seven) days.  Modified Medications   No medications on file  Discontinued Medications   No medications on file     Physical Exam: Filed Vitals:   11/06/14 1412  BP: 108/77  Pulse: 78  Temp: 97.3 F (36.3 C)  Resp: 20  Weight: 140  lb (63.504 kg)    Physical Exam  Constitutional: She appears well-developed and well-nourished. No distress.  HENT:  Head: Normocephalic and atraumatic.  Mouth/Throat: Oropharynx is clear and moist. No oropharyngeal exudate.  Eyes: Conjunctivae are normal. Pupils are equal, round, and reactive to light.  Neck: Normal range of motion.  Cardiovascular: Normal rate, regular rhythm and normal heart sounds.   Pulmonary/Chest: Effort normal and breath sounds normal.  Abdominal: Soft. Bowel sounds are normal.  Musculoskeletal: She exhibits no edema or tenderness.  Neurological: She is alert.  Skin: Skin is warm and dry. She is not diaphoretic.  Toenail fungus with surrounding tissues red and tender.   Psychiatric: She has a normal mood and affect.    Labs reviewed: Basic Metabolic Panel: No results for input(s): NA, K, CL, CO2, GLUCOSE, BUN, CREATININE, CALCIUM, MG, PHOS in the last 8760  hours. Liver Function Tests: No results for input(s): AST, ALT, ALKPHOS, BILITOT, PROT, ALBUMIN in the last 8760 hours. No results for input(s): LIPASE, AMYLASE in the last 8760 hours. No results for input(s): AMMONIA in the last 8760 hours. CBC: No results for input(s): WBC, NEUTROABS, HGB, HCT, MCV, PLT in the last 8760 hours. TSH: No results for input(s): TSH in the last 8760 hours. A1C: No results found for: HGBA1C Lipid Panel: No results for input(s): CHOL, HDL, LDLCALC, TRIG, CHOLHDL, LDLDIRECT in the last 8760 hours.  Lipid Profile  Result: 06/19/2013 4:04 PM ( Status: F )  Cholesterol 148 0-200 mg/dL SLN C  Triglyceride 285 H <150 mg/dL SLN  HDL Cholesterol 36 L >39 mg/dL SLN  Total Chol/HDL Ratio 4.1 Ratio SLN  VLDL Cholesterol (Calc) 57 H 0-40 mg/dL SLN  LDL Cholesterol (Calc) 55 0-99 mg/dL SLN C  Liver Profile  Result: 06/19/2013 4:04 PM ( Status: F )  Bilirubin, Total 0.3 0.3-1.2 mg/dL SLN  Bilirubin, Direct <0.1 0.0-0.3 mg/dL SLN  Indirect Bilirubin NOT CALC 0.0-0.9 mg/dL SLN  Alkaline Phosphatase 77 39-117 U/L SLN  AST/SGOT 19 0-37 U/L SLN  ALT/SGPT 12 0-35 U/L SLN  Total Protein 7.3 6.0-8.3 g/dL SLN  Albumin 4.3  CBC with Diff  Result: 08/31/2013 11:26 PM ( Status: F ) C  WBC 6.9 4.0-10.5 K/uL SLN  RBC 4.04 3.87-5.11 MIL/uL SLN  Hemoglobin 12.3 12.0-15.0 g/dL SLN  Hematocrit 36.6 36.0-46.0 % SLN  MCV 90.6 78.0-100.0 fL SLN  MCH 30.4 26.0-34.0 pg SLN  MCHC 33.6 30.0-36.0 g/dL SLN  RDW 13.0 11.5-15.5 % SLN  Platelet Count 263 150-400 K/uL SLN  Granulocyte % 52 43-77 % SLN  Absolute Gran 3.6 1.7-7.7 K/uL SLN  Lymph % 37 12-46 % SLN  Absolute Lymph 2.6 0.7-4.0 K/uL SLN  Mono % 7 3-12 % SLN  Absolute Mono 0.5 0.1-1.0 K/uL SLN  Eos % 4 0-5 % SLN  Absolute Eos 0.3 0.0-0.7 K/uL SLN  Baso % 0 0-1 % SLN  Absolute Baso 0.0 0.0-0.1 K/uL SLN  Smear Review Criteria for review not met SLN  Comprehensive Metabolic Panel   Result: 6/83/4196 1:23 AM ( Status: F )  Sodium 142 135-145 mEq/L SLN  Potassium 3.9 3.5-5.3 mEq/L SLN  Chloride 105 96-112 mEq/L SLN  CO2 28 19-32 mEq/L SLN  Glucose 94 70-99 mg/dL SLN  BUN 25 H 6-23 mg/dL SLN  Creatinine 1.38 H 0.50-1.10 mg/dL SLN  Bilirubin, Total 0.4 0.3-1.2 mg/dL SLN  Alkaline Phosphatase 82 39-117 U/L SLN  AST/SGOT 20 0-37 U/L SLN  ALT/SGPT 17 0-35 U/L SLN  Total Protein 7.2 6.0-8.3 g/dL SLN  Albumin 4.1 3.5-5.2 g/dL SLN  Calcium 9.6 8.4-10.5 mg/dL SLN  Lipid Profile  Result: 09/21/2013 10:32 AM ( Status: F ) C  Cholesterol 133 0-200 mg/dL SLN C  Triglyceride 161 H <150 mg/dL SLN  HDL Cholesterol 37 L >39 mg/dL SLN  Total Chol/HDL Ratio 3.6 Ratio SLN  VLDL Cholesterol (Calc) 32 0-40 mg/dL SLN  LDL Cholesterol (Calc) 64 0-99 CBC with Diff  Result: 02/05/2014 2:26 PM ( Status: F )   WBC7.4  4.0-10.5K/uLSLN  RBC3.84 L3.87-5.11MIL/uLSLN  Hemoglobin11.8 L12.0-15.0g/dLSLN  Hematocrit34.2 L36.0-46.0%SLN  MCV89.1  78.0-100.25fSLN  MCH30.7  26.0-34.0pgSLN  MCHC34.5  30.0-36.0g/dLSLN  RDW13.3  11.5-15.5%SLN  Platelet Count304  150-400K/uLSLN  Granulocyte %55  43-77%SLN  Absolute Gran4.1  1.7-7.7K/uLSLN  Lymph %37  12-46%SLN  Absolute Lymph2.7  0.7-4.0K/uLSLN   Mono %6  3-12%SLN  Absolute Mono0.4  0.1-1.0K/uLSLN  Eos %2  0-5%SLN  Absolute Eos0.1  0.0-0.7K/uLSLN  Baso %0  0-1%SLN  Absolute Baso0.0  0.0-0.1K/uLSLN  Smear ReviewCriteria for review not metSLN  Comprehensive Metabolic Panel  Result: 63/97/67342:43 PM ( Status: F )   Sodium143  135-1474m/LSLN  Potassium4.3  3.5-5.32m4mLSLN  Chloride106  96-112m37mSLN  CO225  19-32mE49mLN  Glucose98  70-99mg/432mN  BUN36 H6-232mg/d49m  Creatinine1.84 H0.50-1.10mg/dLSLN  Bilirubin, Total0.3  0.2-1.2mg/dLS36m Alkaline Phosphatase80  39-117U/LSLN  AST/SGOT23  0-37U/LSLN  ALT/SGPT17  0-35U/LSLN  Total Protein7.0  6.0-8.3g/dLSLN  Albumin4.1  3.5-5.2g/dLSLN  Calcium9.3  8.4-10.5mg/dLSL15mVitamin D (25-Hydroxy)  Result: 02/06/2014 4:02 AM ( Status: F )   Vitamin D (25-Hydroxy)45   30-89ng/m19-37TK/WIOXBDtabolic Panel  Result: 03/15/2014 12/11/2990( Status: F )   Sodium142  135-145mEq/LSL40motassium3.9  3.5-5.32mEq/LSLN 27mloride107  96-112mEq/LSLN 37m26  19-32mEq/LSLN  40mose93  70-99mg/dLSLN  B67m H6-232mg/dLSLN  Cr109mnine2.11 H0.50-1.10mg/dLSLN  Calcium9.4  Lipid Panel    Result: 05/23/2014 11:35 AM   ( Status: F )     C Cholesterol 129     0-200 mg/dL SLN C Triglyceride 46     <150 mg/dL SLN   HDL Cholesterol 43     >39 mg/dL SLN   Total Chol/HDL Ratio 3.0      Ratio SLN   VLDL Cholesterol (Calc) 9     0-40 mg/dL SLN   LDL Cholesterol (Calc) 77     0-99 mg/dL SLN C  CBC with Diff    Result: 09/25/2014 10:36 PM   ( Status: F )     C WBC 9.4     4.0-10.5 K/uL SLN   RBC 3.76   L 3.87-5.11 MIL/uL SLN   Hemoglobin 11.3   L 12.0-15.0 g/dL SLN   Hematocrit 33.2   L 36.0-46.0 % SLN   MCV 88.3     78.0-100.0 fL SLN   MCH 30.1     26.0-34.0 pg SLN   MCHC 34.0     30.0-36.0 g/dL SLN   RDW 14.1     11.5-15.5 % SLN   Platelet Count 254     150-400 K/uL SLN   MPV 9.0     8.6-12.4 fL SLN   Granulocyte % 51     43-77 % SLN   Absolute Gran 4.8     1.7-7.7 K/uL SLN   Lymph % 38     12-46 % SLN   Absolute Lymph 3.6     0.7-4.0 K/uL SLN   Mono % 8     3-12 % SLN   Absolute Mono 0.8     0.1-1.0 K/uL SLN   Eos %  3     0-5 % SLN   Absolute Eos 0.3     0.0-0.7 K/uL SLN   Baso % 0     0-1 % SLN   Absolute Baso 0.0     0.0-0.1 K/uL SLN   Smear Review Criteria for review not met  SLN   Basic Metabolic Panel    Result: 09/25/2014 11:21 PM   ( Status: F )       Sodium 143      135-145 mEq/L SLN   Potassium 4.0     3.5-5.3 mEq/L SLN   Chloride 109     96-112 mEq/L SLN   CO2 25     19-32 mEq/L SLN   Glucose 87     70-99 mg/dL SLN   BUN 32   H 6-23 mg/dL SLN   Creatinine 1.78   H 0.50-1.10 mg/dL SLN   Calcium 9.0     8.4-10.5 mg/dL SLN   TSH    Result: 09/25/2014 11:08 PM   ( Status: F )       TSH 1.638     0.350-4.500 uIU/mL SLN   Vitamin D, 25-Hydroxy, Total    Result: 09/26/2014 2:35 AM   ( Status: F )       Vitamin D (25-Hydroxy) 23   L 30-100 ng/mL SLN C  Assessment/Plan  1. Essential hypertension, benign Controlled on current regimen.   2. Allergic rhinitis due to pollen Remains on Claritin daily   3. Dementia with behavioral disturbance Dementia remains unchanged, conts on aricept and namenda, no worsening of behaviors, cognitive or functional status  4. CKD (chronic kidney disease) stage 3, GFR 30-59 ml/min BUN/Cr stable   5. Vitamin D deficiency Conts on Vit D 50000 units weekly  6. Anemia of chronic disease hgb stable on recent labs, currently 11.3  7. Anxiety state Controlled, no worsening anxiety noted

## 2014-12-14 ENCOUNTER — Non-Acute Institutional Stay (SKILLED_NURSING_FACILITY): Payer: Medicare Other | Admitting: Nurse Practitioner

## 2014-12-14 DIAGNOSIS — N183 Chronic kidney disease, stage 3 unspecified: Secondary | ICD-10-CM

## 2014-12-14 DIAGNOSIS — I10 Essential (primary) hypertension: Secondary | ICD-10-CM

## 2014-12-14 DIAGNOSIS — E559 Vitamin D deficiency, unspecified: Secondary | ICD-10-CM | POA: Diagnosis not present

## 2014-12-14 DIAGNOSIS — F03918 Unspecified dementia, unspecified severity, with other behavioral disturbance: Secondary | ICD-10-CM

## 2014-12-14 DIAGNOSIS — F0391 Unspecified dementia with behavioral disturbance: Secondary | ICD-10-CM | POA: Diagnosis not present

## 2014-12-14 DIAGNOSIS — F411 Generalized anxiety disorder: Secondary | ICD-10-CM | POA: Diagnosis not present

## 2014-12-14 NOTE — Progress Notes (Signed)
Patient ID: Amanda [name], female   DOB: 04/27/1946, 69 y.o.   MRN: [id number]    Nursing Home Location:  [facility]   Place of Service: SNF (38)  PCP: Kandice Hams, MD  No Known Allergies  Chief Complaint  Patient presents with  . Medical Management of Chronic Issues    HPI:  Patient is a 69 y.o. female seen today at [facility] for routine follow up on chronic conditions. Pt with a  PMH of renal insufficieny, dementia with behaviors, HTN and psychosis.pt has been in her usual state of health over the last month no acute issues. conts Vit D 50000 units weekly due to vit D def, after she completes 3 months will reduce to 1000 units daily. Pt without any changes to cognitive or functional status noted.    Review of Systems:  Review of Systems  Unable to perform ROS: Dementia    Past Medical History  Diagnosis Date  . Hypertension   . Stroke ~ 2003    "slight memory loss" (05/18/2012)  . Gout     "?feet" (05/18/2012)  . Dementia     "alzheimer's" (05/18/2012)  . Incontinence of urine   . Wrist fracture, bilateral 05/17/2012    "fell down steps" (05/18/2012)  . Fracture of medial wall of orbit 05/17/2012  . Fall 05/17/2012   Past Surgical History  Procedure Laterality Date  . Vaginal hysterectomy     Social History:   reports that she has been smoking Cigarettes.  She has never used smokeless tobacco. She reports that she drinks alcohol. She reports that she uses illicit drugs (Marijuana).  Family History  Problem Relation Age of Onset  . Dementia Mother   . Dementia Father   . Dementia Sister   . Dementia Brother     Medications: Patient's Medications  New Prescriptions   No medications on file  Previous Medications   AMBULATORY NON FORMULARY MEDICATION    Lorazepam 0.41m gel Sig: Apply 0.563mtopically every 12 hours as needed for anxiety   AMLODIPINE (NORVASC) 10 MG TABLET    Take 10 mg by mouth daily.   ATORVASTATIN  (LIPITOR) 10 MG TABLET    Take 10 mg by mouth daily.   CLONIDINE (CATAPRES) 0.1 MG TABLET    Take 0.1 mg by mouth 3 (three) times daily.    DONEPEZIL (ARICEPT) 10 MG TABLET    Take 10 mg by mouth at bedtime.   HYDROCODONE-ACETAMINOPHEN (NORCO) 5-325 MG PER TABLET    Take one tablet by mouth every 6 hours as needed; Take two tablets by mouth every 6 hours as needed for pain   LABETALOL (NORMODYNE) 300 MG TABLET    Take 300 mg by mouth 2 (two) times daily.   LORATADINE (CLARITIN) 10 MG TABLET    Take 10 mg by mouth daily.   LORAZEPAM (ATIVAN) 2 MG TABLET    Take one tablet by mouth prior to next dentist visit   MEMANTINE HCL ER (NAMENDA XR) 28 MG CP24    Take 14 mg by mouth daily.    MULTIPLE MINERALS-VITAMINS (CALCIUM & VIT D3 BONE HEALTH PO)    Take by mouth daily.   VITAMIN D, ERGOCALCIFEROL, (DRISDOL) 50000 UNITS CAPS CAPSULE    Take 50,000 Units by mouth every 7 (seven) days.  Modified Medications   No medications on file  Discontinued Medications   No medications on file     Physical Exam: Filed Vitals:   12/14/14  1410  BP: 129/54  Pulse: 70  Temp: 98.5 F (36.9 C)  Resp: 20  Weight: 135 lb (61.236 kg)    Physical Exam  Constitutional: She appears well-developed and well-nourished. No distress.  HENT:  Head: Normocephalic and atraumatic.  Mouth/Throat: Oropharynx is clear and moist. No oropharyngeal exudate.  Eyes: Conjunctivae are normal. Pupils are equal, round, and reactive to light.  Neck: Normal range of motion.  Cardiovascular: Normal rate, regular rhythm and normal heart sounds.   Pulmonary/Chest: Effort normal and breath sounds normal.  Abdominal: Soft. Bowel sounds are normal.  Musculoskeletal: She exhibits no edema or tenderness.  Neurological: She is alert.  Skin: Skin is warm and dry. She is not diaphoretic.  Psychiatric:  STML, does not answer questions appropriately     Labs reviewed: Basic Metabolic Panel: No results for input(s): NA, K, CL, CO2,  GLUCOSE, BUN, CREATININE, CALCIUM, MG, PHOS in the last 8760 hours. Liver Function Tests: No results for input(s): AST, ALT, ALKPHOS, BILITOT, PROT, ALBUMIN in the last 8760 hours. No results for input(s): LIPASE, AMYLASE in the last 8760 hours. No results for input(s): AMMONIA in the last 8760 hours. CBC: No results for input(s): WBC, NEUTROABS, HGB, HCT, MCV, PLT in the last 8760 hours. TSH: No results for input(s): TSH in the last 8760 hours. A1C: No results found for: HGBA1C Lipid Panel: No results for input(s): CHOL, HDL, LDLCALC, TRIG, CHOLHDL, LDLDIRECT in the last 8760 hours.  Lipid Profile  Result: 06/19/2013 4:04 PM ( Status: F )  Cholesterol 148 0-200 mg/dL SLN C  Triglyceride 285 H <150 mg/dL SLN  HDL Cholesterol 36 L >39 mg/dL SLN  Total Chol/HDL Ratio 4.1 Ratio SLN  VLDL Cholesterol (Calc) 57 H 0-40 mg/dL SLN  LDL Cholesterol (Calc) 55 0-99 mg/dL SLN C  Liver Profile  Result: 06/19/2013 4:04 PM ( Status: F )  Bilirubin, Total 0.3 0.3-1.2 mg/dL SLN  Bilirubin, Direct <0.1 0.0-0.3 mg/dL SLN  Indirect Bilirubin NOT CALC 0.0-0.9 mg/dL SLN  Alkaline Phosphatase 77 39-117 U/L SLN  AST/SGOT 19 0-37 U/L SLN  ALT/SGPT 12 0-35 U/L SLN  Total Protein 7.3 6.0-8.3 g/dL SLN  Albumin 4.3  CBC with Diff  Result: 08/31/2013 11:26 PM ( Status: F ) C  WBC 6.9 4.0-10.5 K/uL SLN  RBC 4.04 3.87-5.11 MIL/uL SLN  Hemoglobin 12.3 12.0-15.0 g/dL SLN  Hematocrit 36.6 36.0-46.0 % SLN  MCV 90.6 78.0-100.0 fL SLN  MCH 30.4 26.0-34.0 pg SLN  MCHC 33.6 30.0-36.0 g/dL SLN  RDW 13.0 11.5-15.5 % SLN  Platelet Count 263 150-400 K/uL SLN  Granulocyte % 52 43-77 % SLN  Absolute Gran 3.6 1.7-7.7 K/uL SLN  Lymph % 37 12-46 % SLN  Absolute Lymph 2.6 0.7-4.0 K/uL SLN  Mono % 7 3-12 % SLN  Absolute Mono 0.5 0.1-1.0 K/uL SLN  Eos % 4 0-5 % SLN  Absolute Eos 0.3 0.0-0.7 K/uL SLN  Baso % 0 0-1 % SLN  Absolute Baso 0.0 0.0-0.1 K/uL SLN  Smear Review Criteria  for review not met SLN  Comprehensive Metabolic Panel  Result: 11/25/4079 1:23 AM ( Status: F )  Sodium 142 135-145 mEq/L SLN  Potassium 3.9 3.5-5.3 mEq/L SLN  Chloride 105 96-112 mEq/L SLN  CO2 28 19-32 mEq/L SLN  Glucose 94 70-99 mg/dL SLN  BUN 25 H 6-23 mg/dL SLN  Creatinine 1.38 H 0.50-1.10 mg/dL SLN  Bilirubin, Total 0.4 0.3-1.2 mg/dL SLN  Alkaline Phosphatase 82 39-117 U/L SLN  AST/SGOT 20 0-37 U/L SLN  ALT/SGPT 17  0-35 U/L SLN  Total Protein 7.2 6.0-8.3 g/dL SLN  Albumin 4.1 3.5-5.2 g/dL SLN  Calcium 9.6 8.4-10.5 mg/dL SLN  Lipid Profile  Result: 09/21/2013 10:32 AM ( Status: F ) C  Cholesterol 133 0-200 mg/dL SLN C  Triglyceride 161 H <150 mg/dL SLN  HDL Cholesterol 37 L >39 mg/dL SLN  Total Chol/HDL Ratio 3.6 Ratio SLN  VLDL Cholesterol (Calc) 32 0-40 mg/dL SLN  LDL Cholesterol (Calc) 64 0-99 CBC with Diff  Result: 02/05/2014 2:26 PM ( Status: F )   WBC7.4  4.0-10.5K/uLSLN  RBC3.84 L3.87-5.11MIL/uLSLN  Hemoglobin11.8 L12.0-15.0g/dLSLN  Hematocrit34.2 L36.0-46.0%SLN  MCV89.1  78.0-100.43fSLN  MCH30.7  26.0-34.0pgSLN  MCHC34.5  30.0-36.0g/dLSLN  RDW13.3  11.5-15.5%SLN  Platelet Count304  150-400K/uLSLN  Granulocyte %55  43-77%SLN  Absolute Gran4.1  1.7-7.7K/uLSLN  Lymph %37  12-46%SLN  Absolute  Lymph2.7  0.7-4.0K/uLSLN  Mono %6  3-12%SLN  Absolute Mono0.4  0.1-1.0K/uLSLN  Eos %2  0-5%SLN  Absolute Eos0.1  0.0-0.7K/uLSLN  Baso %0  0-1%SLN  Absolute Baso0.0  0.0-0.1K/uLSLN  Smear ReviewCriteria for review not metSLN  Comprehensive Metabolic Panel  Result: 61/93/79022:43 PM ( Status: F )   Sodium143  135-1455m/LSLN  Potassium4.3  3.5-5.6m104mLSLN  Chloride106  96-112m16mSLN  CO225  19-32mE29mLN  Glucose98  70-99mg/46mN  BUN36 H6-26mg/d12m  Creatinine1.84 H0.50-1.10mg/dLSLN  Bilirubin, Total0.3  0.2-1.2mg/dLS34m Alkaline Phosphatase80  39-117U/LSLN  AST/SGOT23  0-37U/LSLN  ALT/SGPT17  0-35U/LSLN  Total Protein7.0  6.0-8.3g/dLSLN  Albumin4.1  3.5-5.2g/dLSLN  Calcium9.3  8.4-10.5mg/dLSL42mVitamin D (25-Hydroxy)  Result: 02/06/2014 4:02 AM ( Status: F )    Vitamin D (25-Hydroxy)45  30-89ng/m40-97DZ/HGDJMEtabolic Panel  Result: 03/15/2014 09/16/8339( Status: F )   Sodium142  135-145mEq/LSL54motassium3.9  3.5-5.6mEq/LSLN 97mloride107  96-112mEq/LSLN 26m26  19-32mEq/LSLN  50mose93  70-99mg/dLSLN  B30m H6-26mg/dLSLN  Cr29mnine2.11 H0.50-1.10mg/dLSLN  Calcium9.4  Lipid Panel    Result: 05/23/2014 11:35 AM   ( Status: F )     C Cholesterol 129     0-200 mg/dL SLN C Triglyceride 46     <150 mg/dL SLN   HDL Cholesterol 43     >39 mg/dL SLN   Total Chol/HDL Ratio 3.0      Ratio SLN   VLDL Cholesterol (Calc) 9     0-40 mg/dL SLN   LDL Cholesterol (Calc) 77     0-99 mg/dL SLN C  CBC with Diff    Result: 09/25/2014 10:36 PM   ( Status: F )     C WBC 9.4     4.0-10.5 K/uL SLN   RBC 3.76   L 3.87-5.11 MIL/uL SLN   Hemoglobin 11.3   L 12.0-15.0 g/dL SLN   Hematocrit 33.2   L 36.0-46.0 % SLN   MCV 88.3     78.0-100.0 fL SLN   MCH 30.1     26.0-34.0 pg SLN   MCHC 34.0     30.0-36.0 g/dL SLN   RDW 14.1     11.5-15.5 % SLN   Platelet Count 254     150-400 K/uL SLN   MPV 9.0     8.6-12.4 fL SLN   Granulocyte % 51     43-77 % SLN   Absolute Gran 4.8     1.7-7.7 K/uL SLN   Lymph % 38     12-46 % SLN   Absolute Lymph 3.6     0.7-4.0 K/uL SLN   Mono % 8     3-12 % SLN   Absolute Mono  0.8     0.1-1.0 K/uL SLN   Eos % 3     0-5 % SLN   Absolute Eos 0.3     0.0-0.7 K/uL SLN   Baso % 0     0-1 % SLN   Absolute Baso 0.0     0.0-0.1 K/uL SLN   Smear Review Criteria for review not met  SLN   Basic Metabolic  Panel    Result: 09/25/2014 11:21 PM   ( Status: F )       Sodium 143     135-145 mEq/L SLN   Potassium 4.0     3.5-5.3 mEq/L SLN   Chloride 109     96-112 mEq/L SLN   CO2 25     19-32 mEq/L SLN   Glucose 87     70-99 mg/dL SLN   BUN 32   H 6-23 mg/dL SLN   Creatinine 1.78   H 0.50-1.10 mg/dL SLN   Calcium 9.0     8.4-10.5 mg/dL SLN   TSH    Result: 09/25/2014 11:08 PM   ( Status: F )       TSH 1.638     0.350-4.500 uIU/mL SLN   Vitamin D, 25-Hydroxy, Total    Result: 09/26/2014 2:35 AM   ( Status: F )       Vitamin D (25-Hydroxy) 23   L 30-100 ng/mL SLN C  Assessment/Plan  1. Vitamin D deficiency -cont vit d 50000 units weekly for 3 months then will change to 1000 units PO daily   2. Anxiety state -well controlled on current medications, currently on no routine medications for this, has ativan as needed only   3. CKD (chronic kidney disease) stage 3, GFR 30-59 ml/min Worsening renal function, on no nephrotoxic medications  4. Dementia with behavioral disturbance namenda has been reduced due to worsening renal function, conts on aricept, no changes in behavior at this time  5. Essential hypertension, benign Controlled on current regimen, conts on catapres, labetalol and norvasc

## 2014-12-20 LAB — LIPID PANEL
Cholesterol: 114 mg/dL (ref 0–200)
HDL: 31 mg/dL — AB (ref 35–70)
LDL CALC: 66 mg/dL
TRIGLYCERIDES: 144 mg/dL (ref 40–160)

## 2014-12-20 LAB — CBC AND DIFFERENTIAL
HEMATOCRIT: 33 % — AB (ref 36–46)
HEMOGLOBIN: 11.4 g/dL — AB (ref 12.0–16.0)
Platelets: 267 10*3/uL (ref 150–399)
WBC: 7.4 10^3/mL

## 2014-12-20 LAB — HEPATIC FUNCTION PANEL
ALT: 9 U/L (ref 7–35)
AST: 11 U/L — AB (ref 13–35)
Alkaline Phosphatase: 81 U/L (ref 25–125)
BILIRUBIN DIRECT: 0.1 mg/dL (ref 0.01–0.4)
Bilirubin, Total: 0.3 mg/dL

## 2015-01-18 ENCOUNTER — Non-Acute Institutional Stay (SKILLED_NURSING_FACILITY): Payer: Medicare Other | Admitting: Nurse Practitioner

## 2015-01-18 DIAGNOSIS — F0391 Unspecified dementia with behavioral disturbance: Secondary | ICD-10-CM

## 2015-01-18 DIAGNOSIS — N183 Chronic kidney disease, stage 3 unspecified: Secondary | ICD-10-CM

## 2015-01-18 DIAGNOSIS — I10 Essential (primary) hypertension: Secondary | ICD-10-CM

## 2015-01-18 DIAGNOSIS — F411 Generalized anxiety disorder: Secondary | ICD-10-CM

## 2015-01-18 DIAGNOSIS — F03918 Unspecified dementia, unspecified severity, with other behavioral disturbance: Secondary | ICD-10-CM

## 2015-01-18 DIAGNOSIS — J301 Allergic rhinitis due to pollen: Secondary | ICD-10-CM

## 2015-01-18 DIAGNOSIS — E785 Hyperlipidemia, unspecified: Secondary | ICD-10-CM

## 2015-01-18 NOTE — Progress Notes (Signed)
Patient ID: Amanda [name], female   DOB: 04/01/1946, 69 y.o.   MRN: [id number]    Nursing Home Location:  [facility]   Place of Service: SNF (60)  PCP: Kandice Hams, MD  No Known Allergies  Chief Complaint  Patient presents with  . Medical Management of Chronic Issues    HPI:  Patient is a 69 y.o. female seen today at [facility] for routine follow up on chronic conditions. Pt with a  PMH of renal insufficieny, dementia with behaviors, HTN and psychosis.pt has been in her usual state of health over the last month no acute issues. Pts husband with her today. No concerns from him reports she is doing well. Eating good. No changes in behaviors. Bowel moving regularly.   Review of Systems:  Review of Systems  Unable to perform ROS: Dementia    Past Medical History  Diagnosis Date  . Hypertension   . Stroke ~ 2003    "slight memory loss" (05/18/2012)  . Gout     "?feet" (05/18/2012)  . Dementia     "alzheimer's" (05/18/2012)  . Incontinence of urine   . Wrist fracture, bilateral 05/17/2012    "fell down steps" (05/18/2012)  . Fracture of medial wall of orbit 05/17/2012  . Fall 05/17/2012   Past Surgical History  Procedure Laterality Date  . Vaginal hysterectomy     Social History:   reports that she has been smoking Cigarettes.  She has never used smokeless tobacco. She reports that she drinks alcohol. She reports that she uses illicit drugs (Marijuana).  Family History  Problem Relation Age of Onset  . Dementia Mother   . Dementia Father   . Dementia Sister   . Dementia Brother     Medications: Patient's Medications  New Prescriptions   No medications on file  Previous Medications   AMBULATORY NON FORMULARY MEDICATION    Lorazepam 0.10m gel Sig: Apply 0.583mtopically every 12 hours as needed for anxiety   AMLODIPINE (NORVASC) 10 MG TABLET    Take 10 mg by mouth daily.   ATORVASTATIN (LIPITOR) 10 MG TABLET    Take 10 mg by  mouth daily.   CLONIDINE (CATAPRES) 0.1 MG TABLET    Take 0.1 mg by mouth 3 (three) times daily.    DONEPEZIL (ARICEPT) 10 MG TABLET    Take 10 mg by mouth at bedtime.   HYDROCODONE-ACETAMINOPHEN (NORCO) 5-325 MG PER TABLET    Take one tablet by mouth every 6 hours as needed; Take two tablets by mouth every 6 hours as needed for pain   LABETALOL (NORMODYNE) 300 MG TABLET    Take 300 mg by mouth 2 (two) times daily.   LORATADINE (CLARITIN) 10 MG TABLET    Take 10 mg by mouth daily.   LORAZEPAM (ATIVAN) 2 MG TABLET    Take one tablet by mouth prior to next dentist visit   MEMANTINE HCL ER (NAMENDA XR) 28 MG CP24    Take 14 mg by mouth daily.    MULTIPLE MINERALS-VITAMINS (CALCIUM & VIT D3 BONE HEALTH PO)    Take by mouth daily.   VITAMIN D, ERGOCALCIFEROL, (DRISDOL) 50000 UNITS CAPS CAPSULE    Take 50,000 Units by mouth every 7 (seven) days.  Modified Medications   No medications on file  Discontinued Medications   No medications on file     Physical Exam: Filed Vitals:   01/18/15 1729  BP: 158/96  Pulse: 96  Temp: 97.3  F (36.3 C)  Resp: 18  Weight: 137 lb (62.143 kg)    Physical Exam  Constitutional: She appears well-developed and well-nourished. No distress.  HENT:  Head: Normocephalic and atraumatic.  Mouth/Throat: Oropharynx is clear and moist. No oropharyngeal exudate.  Eyes: Conjunctivae are normal. Pupils are equal, round, and reactive to light.  Neck: Normal range of motion.  Cardiovascular: Normal rate, regular rhythm and normal heart sounds.   Pulmonary/Chest: Effort normal and breath sounds normal.  Abdominal: Soft. Bowel sounds are normal.  Musculoskeletal: She exhibits no edema or tenderness.  Neurological: She is alert.  Skin: Skin is warm and dry. She is not diaphoretic.  Psychiatric:  Advanced dementia, mumbles      Labs reviewed: Basic Metabolic Panel: No results for input(s): NA, K, CL, CO2, GLUCOSE, BUN, CREATININE, CALCIUM, MG, PHOS in the last 8760  hours. Liver Function Tests: No results for input(s): AST, ALT, ALKPHOS, BILITOT, PROT, ALBUMIN in the last 8760 hours. No results for input(s): LIPASE, AMYLASE in the last 8760 hours. No results for input(s): AMMONIA in the last 8760 hours. CBC: No results for input(s): WBC, NEUTROABS, HGB, HCT, MCV, PLT in the last 8760 hours. TSH: No results for input(s): TSH in the last 8760 hours. A1C: No results found for: HGBA1C Lipid Panel: No results for input(s): CHOL, HDL, LDLCALC, TRIG, CHOLHDL, LDLDIRECT in the last 8760 hours.  Lipid Profile  Result: 06/19/2013 4:04 PM ( Status: F )  Cholesterol 148 0-200 mg/dL SLN C  Triglyceride 285 H <150 mg/dL SLN  HDL Cholesterol 36 L >39 mg/dL SLN  Total Chol/HDL Ratio 4.1 Ratio SLN  VLDL Cholesterol (Calc) 57 H 0-40 mg/dL SLN  LDL Cholesterol (Calc) 55 0-99 mg/dL SLN C  Liver Profile  Result: 06/19/2013 4:04 PM ( Status: F )  Bilirubin, Total 0.3 0.3-1.2 mg/dL SLN  Bilirubin, Direct <0.1 0.0-0.3 mg/dL SLN  Indirect Bilirubin NOT CALC 0.0-0.9 mg/dL SLN  Alkaline Phosphatase 77 39-117 U/L SLN  AST/SGOT 19 0-37 U/L SLN  ALT/SGPT 12 0-35 U/L SLN  Total Protein 7.3 6.0-8.3 g/dL SLN  Albumin 4.3  CBC with Diff  Result: 08/31/2013 11:26 PM ( Status: F ) C  WBC 6.9 4.0-10.5 K/uL SLN  RBC 4.04 3.87-5.11 MIL/uL SLN  Hemoglobin 12.3 12.0-15.0 g/dL SLN  Hematocrit 36.6 36.0-46.0 % SLN  MCV 90.6 78.0-100.0 fL SLN  MCH 30.4 26.0-34.0 pg SLN  MCHC 33.6 30.0-36.0 g/dL SLN  RDW 13.0 11.5-15.5 % SLN  Platelet Count 263 150-400 K/uL SLN  Granulocyte % 52 43-77 % SLN  Absolute Gran 3.6 1.7-7.7 K/uL SLN  Lymph % 37 12-46 % SLN  Absolute Lymph 2.6 0.7-4.0 K/uL SLN  Mono % 7 3-12 % SLN  Absolute Mono 0.5 0.1-1.0 K/uL SLN  Eos % 4 0-5 % SLN  Absolute Eos 0.3 0.0-0.7 K/uL SLN  Baso % 0 0-1 % SLN  Absolute Baso 0.0 0.0-0.1 K/uL SLN  Smear Review Criteria for review not met SLN  Comprehensive Metabolic Panel   Result: 7/79/3903 1:23 AM ( Status: F )  Sodium 142 135-145 mEq/L SLN  Potassium 3.9 3.5-5.3 mEq/L SLN  Chloride 105 96-112 mEq/L SLN  CO2 28 19-32 mEq/L SLN  Glucose 94 70-99 mg/dL SLN  BUN 25 H 6-23 mg/dL SLN  Creatinine 1.38 H 0.50-1.10 mg/dL SLN  Bilirubin, Total 0.4 0.3-1.2 mg/dL SLN  Alkaline Phosphatase 82 39-117 U/L SLN  AST/SGOT 20 0-37 U/L SLN  ALT/SGPT 17 0-35 U/L SLN  Total Protein 7.2 6.0-8.3 g/dL SLN  Albumin  4.1 3.5-5.2 g/dL SLN  Calcium 9.6 8.4-10.5 mg/dL SLN  Lipid Profile  Result: 09/21/2013 10:32 AM ( Status: F ) C  Cholesterol 133 0-200 mg/dL SLN C  Triglyceride 161 H <150 mg/dL SLN  HDL Cholesterol 37 L >39 mg/dL SLN  Total Chol/HDL Ratio 3.6 Ratio SLN  VLDL Cholesterol (Calc) 32 0-40 mg/dL SLN  LDL Cholesterol (Calc) 64 0-99 CBC with Diff  Result: 02/05/2014 2:26 PM ( Status: F )   WBC7.4  4.0-10.5K/uLSLN  RBC3.84 L3.87-5.11MIL/uLSLN  Hemoglobin11.8 L12.0-15.0g/dLSLN  Hematocrit34.2 L36.0-46.0%SLN  MCV89.1  78.0-100.28fSLN  MCH30.7  26.0-34.0pgSLN  MCHC34.5  30.0-36.0g/dLSLN  RDW13.3  11.5-15.5%SLN  Platelet Count304  150-400K/uLSLN  Granulocyte %55  43-77%SLN  Absolute Gran4.1  1.7-7.7K/uLSLN  Lymph %37  12-46%SLN  Absolute Lymph2.7  0.7-4.0K/uLSLN   Mono %6  3-12%SLN  Absolute Mono0.4  0.1-1.0K/uLSLN  Eos %2  0-5%SLN  Absolute Eos0.1  0.0-0.7K/uLSLN  Baso %0  0-1%SLN  Absolute Baso0.0  0.0-0.1K/uLSLN  Smear ReviewCriteria for review not metSLN  Comprehensive Metabolic Panel  Result: 69/47/65462:43 PM ( Status: F )   Sodium143  135-1415m/LSLN  Potassium4.3  3.5-5.80m780mLSLN  Chloride106  96-112m50mSLN  CO225  19-32mE69mLN  Glucose98  70-99mg/62mN  BUN36 H6-280mg/d28m  Creatinine1.84 H0.50-1.10mg/dLSLN  Bilirubin, Total0.3  0.2-1.2mg/dLS10m Alkaline Phosphatase80  39-117U/LSLN  AST/SGOT23  0-37U/LSLN  ALT/SGPT17  0-35U/LSLN  Total Protein7.0  6.0-8.3g/dLSLN  Albumin4.1  3.5-5.2g/dLSLN  Calcium9.3  8.4-10.5mg/dLSL85mVitamin D (25-Hydroxy)  Result: 02/06/2014 4:02 AM ( Status: F )   Vitamin D (25-Hydroxy)45   30-89ng/m50-35WS/FKCLEXtabolic Panel  Result: 03/15/2014 12/08/6999( Status: F )   Sodium142  135-145mEq/LSL780motassium3.9  3.5-5.80mEq/LSLN 44mloride107  96-112mEq/LSLN 80m26  19-32mEq/LSLN  24mose93  70-99mg/dLSLN  B75m H6-280mg/dLSLN  Cr680mnine2.11 H0.50-1.10mg/dLSLN  Calcium9.4  Lipid Panel    Result: 05/23/2014 11:35 AM   ( Status: F )     C Cholesterol 129     0-200 mg/dL SLN C Triglyceride 46     <150 mg/dL SLN   HDL Cholesterol 43     >39 mg/dL SLN   Total Chol/HDL Ratio 3.0      Ratio SLN   VLDL Cholesterol (Calc) 9     0-40 mg/dL SLN   LDL Cholesterol (Calc) 77     0-99 mg/dL SLN C  CBC with Diff    Result: 09/25/2014 10:36 PM   ( Status: F )     C WBC 9.4     4.0-10.5 K/uL SLN   RBC 3.76   L 3.87-5.11 MIL/uL SLN   Hemoglobin 11.3   L 12.0-15.0 g/dL SLN   Hematocrit 33.2   L 36.0-46.0 % SLN   MCV 88.3     78.0-100.0 fL SLN   MCH 30.1     26.0-34.0 pg SLN   MCHC 34.0     30.0-36.0 g/dL SLN   RDW 14.1     11.5-15.5 % SLN   Platelet Count 254     150-400 K/uL SLN   MPV 9.0     8.6-12.4 fL SLN   Granulocyte % 51     43-77 % SLN   Absolute Gran 4.8     1.7-7.7 K/uL SLN   Lymph % 38     12-46 % SLN   Absolute Lymph 3.6     0.7-4.0 K/uL SLN   Mono % 8     3-12 % SLN   Absolute Mono 0.8     0.1-1.0 K/uL SLN   Eos %  3     0-5 % SLN   Absolute Eos 0.3     0.0-0.7 K/uL SLN   Baso % 0     0-1 % SLN   Absolute Baso 0.0     0.0-0.1 K/uL SLN   Smear Review Criteria for review not met  SLN   Basic Metabolic Panel    Result: 09/25/2014 11:21 PM   ( Status: F )       Sodium 143      135-145 mEq/L SLN   Potassium 4.0     3.5-5.3 mEq/L SLN   Chloride 109     96-112 mEq/L SLN   CO2 25     19-32 mEq/L SLN   Glucose 87     70-99 mg/dL SLN   BUN 32   H 6-23 mg/dL SLN   Creatinine 1.78   H 0.50-1.10 mg/dL SLN   Calcium 9.0     8.4-10.5 mg/dL SLN   TSH    Result: 09/25/2014 11:08 PM   ( Status: F )       TSH 1.638     0.350-4.500 uIU/mL SLN   Vitamin D, 25-Hydroxy, Total    Result: 09/26/2014 2:35 AM   ( Status: F )       Vitamin D (25-Hydroxy) 23   L 30-100 ng/mL SLN C CBC with Diff    Result: 12/19/2014 8:07 PM   ( Status: F )     C WBC 7.4     4.0-10.5 K/uL SLN   RBC 3.82   L 3.87-5.11 MIL/uL SLN   Hemoglobin 11.4   L 12.0-15.0 g/dL SLN   Hematocrit 33.4   L 36.0-46.0 % SLN   MCV 87.4     78.0-100.0 fL SLN   MCH 29.8     26.0-34.0 pg SLN   MCHC 34.1     30.0-36.0 g/dL SLN   RDW 13.6     11.5-15.5 % SLN   Platelet Count 267     150-400 K/uL SLN   MPV 8.8     8.6-12.4 fL SLN   Granulocyte % 49     43-77 % SLN   Absolute Gran 3.6     1.7-7.7 K/uL SLN   Lymph % 40     12-46 % SLN   Absolute Lymph 3.0     0.7-4.0 K/uL SLN   Mono % 6     3-12 % SLN   Absolute Mono 0.4     0.1-1.0 K/uL SLN   Eos % 5     0-5 % SLN   Absolute Eos 0.4     0.0-0.7 K/uL SLN   Baso % 0     0-1 % SLN   Absolute Baso 0.0     0.0-0.1 K/uL SLN   Smear Review Criteria for review not met  SLN   Lipid Panel w/Direct LDL:HDL Ratio    Result: 12/20/2014 2:04 AM   ( Status: F )       Cholesterol 114     0-200 mg/dL SLN   Triglyceride 144     <150 mg/dL SLN   HDL Cholesterol 31   L >=46 mg/dL SLN C Total Chol/HDL Ratio 3.7      Ratio SLN   LDL, Direct 66      mg/dL SLN C LDL:HDL Ratio 2.1      Ratio SLN C Hepatic Function Panel    Result: 12/20/2014 12:45 AM   ( Status: F )  Bilirubin, Total 0.3     0.2-1.2 mg/dL SLN   Bilirubin, Direct 0.1     0.0-0.3 mg/dL SLN   Indirect Bilirubin 0.2     0.2-1.2 mg/dL SLN   Alkaline Phosphatase 81     39-117 U/L SLN   AST/SGOT 11     0-37 U/L SLN     ALT/SGPT 9     0-35 U/L SLN   Total Protein 6.3     6.0-8.3 g/dL SLN   Albumin 3.3   L 3.5-5.2 g/dL SLN   Assessment/Plan  1. Essential hypertension, benign -blood pressure elevated today, however overall at goal, will cont to monitor   2. Allergic rhinitis due to pollen -without worsening of symptoms, conts on Claritin   3. Dementia with behavioral disturbance -unchanged, conts on aricept and namenda   4.Hyperlipidemia -conts on lipitor 10 mg daily, LDL at goal  5. Anxiety state -anxiety has remained stable. Off all scheduled anxiety medications, conts with ativan as needed.

## 2015-02-21 ENCOUNTER — Non-Acute Institutional Stay (SKILLED_NURSING_FACILITY): Payer: Medicare Other | Admitting: Nurse Practitioner

## 2015-02-21 DIAGNOSIS — N183 Chronic kidney disease, stage 3 unspecified: Secondary | ICD-10-CM

## 2015-02-21 DIAGNOSIS — F411 Generalized anxiety disorder: Secondary | ICD-10-CM

## 2015-02-21 DIAGNOSIS — I10 Essential (primary) hypertension: Secondary | ICD-10-CM

## 2015-02-21 DIAGNOSIS — F03918 Unspecified dementia, unspecified severity, with other behavioral disturbance: Secondary | ICD-10-CM

## 2015-02-21 DIAGNOSIS — E559 Vitamin D deficiency, unspecified: Secondary | ICD-10-CM | POA: Diagnosis not present

## 2015-02-21 DIAGNOSIS — D638 Anemia in other chronic diseases classified elsewhere: Secondary | ICD-10-CM | POA: Diagnosis not present

## 2015-02-21 DIAGNOSIS — F0391 Unspecified dementia with behavioral disturbance: Secondary | ICD-10-CM

## 2015-02-21 NOTE — Progress Notes (Signed)
Patient ID: Amanda [name], female   DOB: 1946/03/23, 69 y.o.   MRN: [id number]    Nursing Home Location:  [facility]   Place of Service: SNF (79)  PCP: Kandice Hams, MD  No Known Allergies  Chief Complaint  Patient presents with  . Medical Management of Chronic Issues    HPI:  Patient is a 69 y.o. female seen today at [facility] for routine follow up on chronic conditions. Pt with a  PMH of renal insufficieny, dementia with behaviors, HTN and psychosis.pt has been in her usual state of health over the last month no acute issues. Chronic conditions without change.  Blood pressures have been stable, no increase in anxiety or agitation per staff, eating well.  No problems with bowel or bladder Staff without concerns at this time  Review of Systems:  Review of Systems  Unable to perform ROS: Dementia    Past Medical History  Diagnosis Date  . Hypertension   . Stroke ~ 2003    "slight memory loss" (05/18/2012)  . Gout     "?feet" (05/18/2012)  . Dementia     "alzheimer's" (05/18/2012)  . Incontinence of urine   . Wrist fracture, bilateral 05/17/2012    "fell down steps" (05/18/2012)  . Fracture of medial wall of orbit 05/17/2012  . Fall 05/17/2012   Past Surgical History  Procedure Laterality Date  . Vaginal hysterectomy     Social History:   reports that she has been smoking Cigarettes.  She has never used smokeless tobacco. She reports that she drinks alcohol. She reports that she uses illicit drugs (Marijuana).  Family History  Problem Relation Age of Onset  . Dementia Mother   . Dementia Father   . Dementia Sister   . Dementia Brother     Medications: Patient's Medications  New Prescriptions   No medications on file  Previous Medications   AMBULATORY NON FORMULARY MEDICATION    Lorazepam 0.1m gel Sig: Apply 0.540mtopically every 12 hours as needed for anxiety   AMLODIPINE (NORVASC) 10 MG TABLET    Take 10 mg by mouth  daily.   ATORVASTATIN (LIPITOR) 10 MG TABLET    Take 10 mg by mouth daily.   CLONIDINE (CATAPRES) 0.1 MG TABLET    Take 0.1 mg by mouth 3 (three) times daily.    DONEPEZIL (ARICEPT) 10 MG TABLET    Take 10 mg by mouth at bedtime.   HYDROCODONE-ACETAMINOPHEN (NORCO) 5-325 MG PER TABLET    Take one tablet by mouth every 6 hours as needed; Take two tablets by mouth every 6 hours as needed for pain   LABETALOL (NORMODYNE) 300 MG TABLET    Take 300 mg by mouth 2 (two) times daily.   LORATADINE (CLARITIN) 10 MG TABLET    Take 10 mg by mouth daily.   LORAZEPAM (ATIVAN) 2 MG TABLET    Take one tablet by mouth prior to next dentist visit   MEMANTINE HCL ER (NAMENDA XR) 28 MG CP24    Take 14 mg by mouth daily.    MULTIPLE MINERALS-VITAMINS (CALCIUM & VIT D3 BONE HEALTH PO)    Take by mouth daily.   VITAMIN D, ERGOCALCIFEROL, (DRISDOL) 50000 UNITS CAPS CAPSULE    Take 50,000 Units by mouth every 7 (seven) days.  Modified Medications   No medications on file  Discontinued Medications   No medications on file     Physical Exam: Filed Vitals:   02/21/15 1111  BP: 121/74  Pulse: 60  Temp: 96.7 F (35.9 C)  Resp: 20  Weight: 136 lb (61.689 kg)    Physical Exam  Constitutional: She appears well-developed and well-nourished. No distress.  HENT:  Head: Normocephalic and atraumatic.  Neck: Neck supple.  Cardiovascular: Normal rate, regular rhythm and normal heart sounds.   Pulmonary/Chest: Effort normal and breath sounds normal.  Abdominal: Soft. Bowel sounds are normal. She exhibits no distension. There is no tenderness.  Musculoskeletal: She exhibits no edema or tenderness.  Neurological: She is alert.  Skin: Skin is warm and dry. She is not diaphoretic.  Psychiatric:  Advanced dementia, mumbles      Labs reviewed: Basic Metabolic Panel: No results for input(s): NA, K, CL, CO2, GLUCOSE, BUN, CREATININE, CALCIUM, MG, PHOS in the last 8760 hours. Liver Function Tests: No results for  input(s): AST, ALT, ALKPHOS, BILITOT, PROT, ALBUMIN in the last 8760 hours. No results for input(s): LIPASE, AMYLASE in the last 8760 hours. No results for input(s): AMMONIA in the last 8760 hours. CBC: No results for input(s): WBC, NEUTROABS, HGB, HCT, MCV, PLT in the last 8760 hours. TSH: No results for input(s): TSH in the last 8760 hours. A1C: No results found for: HGBA1C Lipid Panel: No results for input(s): CHOL, HDL, LDLCALC, TRIG, CHOLHDL, LDLDIRECT in the last 8760 hours.  Lipid Profile  Result: 06/19/2013 4:04 PM ( Status: F )  Cholesterol 148 0-200 mg/dL SLN C  Triglyceride 285 H <150 mg/dL SLN  HDL Cholesterol 36 L >39 mg/dL SLN  Total Chol/HDL Ratio 4.1 Ratio SLN  VLDL Cholesterol (Calc) 57 H 0-40 mg/dL SLN  LDL Cholesterol (Calc) 55 0-99 mg/dL SLN C  Liver Profile  Result: 06/19/2013 4:04 PM ( Status: F )  Bilirubin, Total 0.3 0.3-1.2 mg/dL SLN  Bilirubin, Direct <0.1 0.0-0.3 mg/dL SLN  Indirect Bilirubin NOT CALC 0.0-0.9 mg/dL SLN  Alkaline Phosphatase 77 39-117 U/L SLN  AST/SGOT 19 0-37 U/L SLN  ALT/SGPT 12 0-35 U/L SLN  Total Protein 7.3 6.0-8.3 g/dL SLN  Albumin 4.3  CBC with Diff  Result: 08/31/2013 11:26 PM ( Status: F ) C  WBC 6.9 4.0-10.5 K/uL SLN  RBC 4.04 3.87-5.11 MIL/uL SLN  Hemoglobin 12.3 12.0-15.0 g/dL SLN  Hematocrit 36.6 36.0-46.0 % SLN  MCV 90.6 78.0-100.0 fL SLN  MCH 30.4 26.0-34.0 pg SLN  MCHC 33.6 30.0-36.0 g/dL SLN  RDW 13.0 11.5-15.5 % SLN  Platelet Count 263 150-400 K/uL SLN  Granulocyte % 52 43-77 % SLN  Absolute Gran 3.6 1.7-7.7 K/uL SLN  Lymph % 37 12-46 % SLN  Absolute Lymph 2.6 0.7-4.0 K/uL SLN  Mono % 7 3-12 % SLN  Absolute Mono 0.5 0.1-1.0 K/uL SLN  Eos % 4 0-5 % SLN  Absolute Eos 0.3 0.0-0.7 K/uL SLN  Baso % 0 0-1 % SLN  Absolute Baso 0.0 0.0-0.1 K/uL SLN  Smear Review Criteria for review not met SLN  Comprehensive Metabolic Panel  Result: 11/06/5186 1:23 AM ( Status: F )   Sodium 142 135-145 mEq/L SLN  Potassium 3.9 3.5-5.3 mEq/L SLN  Chloride 105 96-112 mEq/L SLN  CO2 28 19-32 mEq/L SLN  Glucose 94 70-99 mg/dL SLN  BUN 25 H 6-23 mg/dL SLN  Creatinine 1.38 H 0.50-1.10 mg/dL SLN  Bilirubin, Total 0.4 0.3-1.2 mg/dL SLN  Alkaline Phosphatase 82 39-117 U/L SLN  AST/SGOT 20 0-37 U/L SLN  ALT/SGPT 17 0-35 U/L SLN  Total Protein 7.2 6.0-8.3 g/dL SLN  Albumin 4.1 3.5-5.2 g/dL SLN  Calcium 9.6 8.4-10.5 mg/dL  SLN  Lipid Profile  Result: 09/21/2013 10:32 AM ( Status: F ) C  Cholesterol 133 0-200 mg/dL SLN C  Triglyceride 161 H <150 mg/dL SLN  HDL Cholesterol 37 L >39 mg/dL SLN  Total Chol/HDL Ratio 3.6 Ratio SLN  VLDL Cholesterol (Calc) 32 0-40 mg/dL SLN  LDL Cholesterol (Calc) 64 0-99 CBC with Diff  Result: 02/05/2014 2:26 PM ( Status: F )   WBC7.4  4.0-10.5K/uLSLN  RBC3.84 L3.87-5.11MIL/uLSLN  Hemoglobin11.8 L12.0-15.0g/dLSLN  Hematocrit34.2 L36.0-46.0%SLN  MCV89.1  78.0-100.31fSLN  MCH30.7  26.0-34.0pgSLN  MCHC34.5  30.0-36.0g/dLSLN  RDW13.3  11.5-15.5%SLN  Platelet Count304  150-400K/uLSLN  Granulocyte %55  43-77%SLN  Absolute Gran4.1  1.7-7.7K/uLSLN  Lymph %37  12-46%SLN  Absolute Lymph2.7  0.7-4.0K/uLSLN  Mono %6   3-12%SLN  Absolute Mono0.4  0.1-1.0K/uLSLN  Eos %2  0-5%SLN  Absolute Eos0.1  0.0-0.7K/uLSLN  Baso %0  0-1%SLN  Absolute Baso0.0  0.0-0.1K/uLSLN  Smear ReviewCriteria for review not metSLN  Comprehensive Metabolic Panel  Result: 60/78/67542:43 PM ( Status: F )   Sodium143  135-1450m/LSLN  Potassium4.3  3.5-5.64m39mLSLN  Chloride106  96-112m52mSLN  CO225  19-32mE52mLN  Glucose98  70-99mg/55mN  BUN36 H6-264mg/d76m  Creatinine1.84 H0.50-1.10mg/dLSLN  Bilirubin, Total0.3  0.2-1.2mg/dLS52m Alkaline Phosphatase80  39-117U/LSLN  AST/SGOT23  0-37U/LSLN  ALT/SGPT17  0-35U/LSLN  Total Protein7.0  6.0-8.3g/dLSLN  Albumin4.1  3.5-5.2g/dLSLN  Calcium9.3  8.4-10.5mg/dLSL11mVitamin D (25-Hydroxy)  Result: 02/06/2014 4:02 AM ( Status: F )   Vitamin D (25-Hydroxy)45  30-89ng/m49-20FE/OFHQRFtabolic  Panel  Result: 03/15/2014 02/12/8831( Status: F )   Sodium142  135-145mEq/LSL56motassium3.9  3.5-5.64mEq/LSLN 61mloride107  96-112mEq/LSLN 9m26  19-32mEq/LSLN  59mose93  70-99mg/dLSLN  B41m H6-264mg/dLSLN  Cr35mnine2.11 H0.50-1.10mg/dLSLN  Calcium9.4  Lipid Panel    Result: 05/23/2014 11:35 AM   ( Status: F )     C Cholesterol 129     0-200 mg/dL SLN C Triglyceride 46     <150 mg/dL SLN   HDL Cholesterol 43     >39 mg/dL SLN   Total Chol/HDL Ratio 3.0      Ratio SLN   VLDL Cholesterol (Calc) 9     0-40 mg/dL SLN   LDL Cholesterol (Calc) 77     0-99 mg/dL SLN C  CBC with Diff    Result: 09/25/2014 10:36 PM   ( Status: F )     C WBC 9.4     4.0-10.5 K/uL SLN   RBC 3.76   L 3.87-5.11 MIL/uL SLN   Hemoglobin 11.3   L 12.0-15.0 g/dL SLN   Hematocrit 33.2   L 36.0-46.0 % SLN   MCV 88.3     78.0-100.0 fL SLN   MCH 30.1     26.0-34.0 pg SLN   MCHC 34.0     30.0-36.0 g/dL SLN   RDW 14.1     11.5-15.5 % SLN   Platelet Count 254     150-400 K/uL SLN   MPV 9.0     8.6-12.4 fL SLN   Granulocyte % 51     43-77 % SLN   Absolute Gran 4.8     1.7-7.7 K/uL SLN   Lymph % 38     12-46 % SLN   Absolute Lymph 3.6     0.7-4.0 K/uL SLN   Mono % 8     3-12 % SLN   Absolute Mono 0.8     0.1-1.0 K/uL SLN   Eos % 3     0-5 % SLN  Absolute Eos 0.3     0.0-0.7 K/uL SLN   Baso % 0     0-1 % SLN   Absolute Baso 0.0     0.0-0.1 K/uL SLN   Smear Review Criteria for review not met  SLN   Basic Metabolic Panel    Result: 09/25/2014 11:21 PM   ( Status: F )       Sodium 143     135-145 mEq/L SLN   Potassium 4.0      3.5-5.3 mEq/L SLN   Chloride 109     96-112 mEq/L SLN   CO2 25     19-32 mEq/L SLN   Glucose 87     70-99 mg/dL SLN   BUN 32   H 6-23 mg/dL SLN   Creatinine 1.78   H 0.50-1.10 mg/dL SLN   Calcium 9.0     8.4-10.5 mg/dL SLN   TSH    Result: 09/25/2014 11:08 PM   ( Status: F )       TSH 1.638     0.350-4.500 uIU/mL SLN   Vitamin D, 25-Hydroxy, Total    Result: 09/26/2014 2:35 AM   ( Status: F )       Vitamin D (25-Hydroxy) 23   L 30-100 ng/mL SLN C CBC with Diff    Result: 12/19/2014 8:07 PM   ( Status: F )     C WBC 7.4     4.0-10.5 K/uL SLN   RBC 3.82   L 3.87-5.11 MIL/uL SLN   Hemoglobin 11.4   L 12.0-15.0 g/dL SLN   Hematocrit 33.4   L 36.0-46.0 % SLN   MCV 87.4     78.0-100.0 fL SLN   MCH 29.8     26.0-34.0 pg SLN   MCHC 34.1     30.0-36.0 g/dL SLN   RDW 13.6     11.5-15.5 % SLN   Platelet Count 267     150-400 K/uL SLN   MPV 8.8     8.6-12.4 fL SLN   Granulocyte % 49     43-77 % SLN   Absolute Gran 3.6     1.7-7.7 K/uL SLN   Lymph % 40     12-46 % SLN   Absolute Lymph 3.0     0.7-4.0 K/uL SLN   Mono % 6     3-12 % SLN   Absolute Mono 0.4     0.1-1.0 K/uL SLN   Eos % 5     0-5 % SLN   Absolute Eos 0.4     0.0-0.7 K/uL SLN   Baso % 0     0-1 % SLN   Absolute Baso 0.0     0.0-0.1 K/uL SLN   Smear Review Criteria for review not met  SLN   Lipid Panel w/Direct LDL:HDL Ratio    Result: 12/20/2014 2:04 AM   ( Status: F )       Cholesterol 114     0-200 mg/dL SLN   Triglyceride 144     <150 mg/dL SLN   HDL Cholesterol 31   L >=46 mg/dL SLN C Total Chol/HDL Ratio 3.7      Ratio SLN   LDL, Direct 66      mg/dL SLN C LDL:HDL Ratio 2.1      Ratio SLN C Hepatic Function Panel    Result: 12/20/2014 12:45 AM   ( Status: F )       Bilirubin, Total 0.3  0.2-1.2 mg/dL SLN   Bilirubin, Direct 0.1     0.0-0.3 mg/dL SLN   Indirect Bilirubin 0.2     0.2-1.2 mg/dL SLN   Alkaline Phosphatase 81     39-117 U/L SLN   AST/SGOT 11     0-37 U/L SLN   ALT/SGPT 9     0-35 U/L SLN   Total  Protein 6.3     6.0-8.3 g/dL SLN   Albumin 3.3   L 3.5-5.2 g/dL SLN   Assessment/Plan  1. Essential hypertension, benign Controlled on labetalol,norvasc and clonidine   2. Dementia with behavioral disturbance Stable, decline anticipated due to progression of disease, no acute changes noted, conts on aricept and namenda  3. CKD (chronic kidney disease) stage 3, GFR 30-59 ml/min Will follow up BMP at this time   4. Anemia of chronic disease hgb stable on recent CBC  5. Anxiety state -anxiety has been stable, no worsening of mood or behaviors  6. Vitamin D deficiency conts on vit D 50,000 units weekly, will follow up Vit D  Denea Cheaney K. Harle Battiest  [facility] [phone] 8 am - 5 pm) [phone] (after hours)

## 2015-02-25 LAB — BASIC METABOLIC PANEL
BUN: 22 mg/dL — AB (ref 4–21)
Creatinine: 1.4 mg/dL — AB (ref 0.5–1.1)
GLUCOSE: 107 mg/dL
POTASSIUM: 3.9 mmol/L (ref 3.4–5.3)
SODIUM: 142 mmol/L (ref 137–147)

## 2015-03-29 ENCOUNTER — Non-Acute Institutional Stay (SKILLED_NURSING_FACILITY): Payer: Medicare Other | Admitting: Nurse Practitioner

## 2015-03-29 DIAGNOSIS — J301 Allergic rhinitis due to pollen: Secondary | ICD-10-CM

## 2015-03-29 DIAGNOSIS — F03918 Unspecified dementia, unspecified severity, with other behavioral disturbance: Secondary | ICD-10-CM

## 2015-03-29 DIAGNOSIS — F0391 Unspecified dementia with behavioral disturbance: Secondary | ICD-10-CM

## 2015-03-29 DIAGNOSIS — E785 Hyperlipidemia, unspecified: Secondary | ICD-10-CM | POA: Diagnosis not present

## 2015-03-29 DIAGNOSIS — I10 Essential (primary) hypertension: Secondary | ICD-10-CM | POA: Diagnosis not present

## 2015-03-29 DIAGNOSIS — F411 Generalized anxiety disorder: Secondary | ICD-10-CM

## 2015-03-29 NOTE — Progress Notes (Signed)
Patient ID: Amanda [name], female   DOB: 07/08/1946, 69 y.o.   MRN: [id number]    Nursing Home Location:  [facility]   Place of Service: SNF (31)  PCP: Katy Apo, MD  No Known Allergies  Chief Complaint  Patient presents with  . Medical Management of Chronic Issues    Routine Visit     HPI:  Patient is a 69 y.o. female seen today at [facility] for routine follow up on chronic conditions. Pt with a PMH of renal insufficieny, dementia with behaviors, HTN and psychosis. There has been no changes in the last month. Nursing reports there has been no acute illness or concerns in the last month. Husband conts to come to facility and help pt with meals. Eating well. No falls. No changes in bowel or bladder habits. No signs or symptoms of pain. No changes in mood or behavior.   Review of Systems:  Review of Systems  Unable to perform ROS: Dementia    Past Medical History  Diagnosis Date  . Hypertension   . Stroke ~ 2003    "slight memory loss" (05/18/2012)  . Gout     "?feet" (05/18/2012)  . Dementia     "alzheimer's" (05/18/2012)  . Incontinence of urine   . Wrist fracture, bilateral 05/17/2012    "fell down steps" (05/18/2012)  . Fracture of medial wall of orbit 05/17/2012  . Fall 05/17/2012   Past Surgical History  Procedure Laterality Date  . Vaginal hysterectomy     Social History:   reports that she has been smoking Cigarettes.  She has never used smokeless tobacco. She reports that she drinks alcohol. She reports that she uses illicit drugs (Marijuana).  Family History  Problem Relation Age of Onset  . Dementia Mother   . Dementia Father   . Dementia Sister   . Dementia Brother     Medications: Patient's Medications  New Prescriptions   No medications on file  Previous Medications   ACETAMINOPHEN (TYLENOL) 325 MG TABLET    Take 650 mg by mouth every 6 (six) hours as needed for moderate pain or fever.   AMLODIPINE  (NORVASC) 10 MG TABLET    Take 10 mg by mouth daily.   ATORVASTATIN (LIPITOR) 10 MG TABLET    Take 10 mg by mouth daily.   CLONIDINE (CATAPRES) 0.1 MG TABLET    Take 0.1 mg by mouth 3 (three) times daily.    DEXTROMETHORPHAN 7.5 MG/5ML SYRP    2 TSP every 6 hours as needed for cough   DONEPEZIL (ARICEPT) 10 MG TABLET    Take 10 mg by mouth at bedtime.   LABETALOL (NORMODYNE) 300 MG TABLET    Take 300 mg by mouth 2 (two) times daily.   LORATADINE (CLARITIN) 10 MG TABLET    Take 10 mg by mouth daily.   LORAZEPAM (LORAZEPAM INTENSOL) 2 MG/ML CONCENTRATED SOLUTION    Place 0.3 mLs (0.6 mg total) into feeding tube every 8 (eight) hours. May use tablet form also   MEMANTINE (NAMENDA XR) 14 MG CP24 24 HR CAPSULE    Take 14 mg by mouth.   MULTIPLE MINERALS-VITAMINS (CALCIUM & VIT D3 BONE HEALTH PO)    Take by mouth daily.   UNABLE TO FIND    Med Name: Med Pass 60 ml by mouth daily for supplement  Modified Medications   Modified Medication Previous Medication   HYDROCODONE-ACETAMINOPHEN (NORCO) 5-325 MG PER TABLET HYDROcodone-acetaminophen (NORCO) 5-325 MG  per tablet      Take 1 by mouth every 6 hours as needed for mild to moderate pain,Take two tablets by mouth every 6 hours as needed for severe pain    Take one tablet by mouth every 6 hours as needed; Take two tablets by mouth every 6 hours as needed for pain  Discontinued Medications   AMBULATORY NON FORMULARY MEDICATION    Lorazepam 0.5mg  gel Sig: Apply 0.5mg  topically every 12 hours as needed for anxiety   LORAZEPAM (ATIVAN) 2 MG TABLET    Take one tablet by mouth prior to next dentist visit   MEMANTINE HCL ER (NAMENDA XR) 28 MG CP24    Take 14 mg by mouth daily.    VITAMIN D, ERGOCALCIFEROL, (DRISDOL) 50000 UNITS CAPS CAPSULE    Take 50,000 Units by mouth every 7 (seven) days.     Physical Exam: Filed Vitals:   03/29/15 1508  BP: 125/74  Pulse: 65  Temp: 97.8 F (36.6 C)  TempSrc: Oral  Resp: 18  Weight: 137 lb (62.143 kg)     Physical Exam  Constitutional: She appears well-developed and well-nourished. No distress.  HENT:  Head: Normocephalic and atraumatic.  Neck: Neck supple.  Cardiovascular: Normal rate, regular rhythm and normal heart sounds.   Pulmonary/Chest: Effort normal and breath sounds normal.  Abdominal: Soft. Bowel sounds are normal. She exhibits no distension. There is no tenderness.  Musculoskeletal: She exhibits no edema or tenderness.  Neurological: She is alert.  Does not follow commands due to advanced dementia.   Skin: Skin is warm and dry. She is not diaphoretic.  Psychiatric:  Advanced dementia, mumbles      Labs reviewed: Basic Metabolic Panel:  Recent Labs  16/10/96  NA 142  K 3.9  BUN 22*  CREATININE 1.4*   Liver Function Tests:  Recent Labs  12/20/14  AST 11*  ALT 9  ALKPHOS 81   No results for input(s): LIPASE, AMYLASE in the last 8760 hours. No results for input(s): AMMONIA in the last 8760 hours. CBC:  Recent Labs  12/20/14  WBC 7.4  HGB 11.4*  HCT 33*  PLT 267   TSH: No results for input(s): TSH in the last 8760 hours. A1C: No results found for: HGBA1C Lipid Panel:  Recent Labs  12/20/14  CHOL 114  HDL 31*  LDLCALC 66  TRIG 045      Assessment/Plan 1. Essential hypertension, benign -blood pressure stable on current regimen, will cont and monitor at this time.   2. Allergic rhinitis due to pollen -remains stable, occasional flare, conts on Claritin 10 mg daily  3. Dementia with behavioral disturbance -dementia has been stable, no acute changes in cognitive or functional status. conts on aricept and namenda   4. Hyperlipidemia LDL stable in May, conts on Lipitor daily  5. Anxiety state -off all medications at this time, conts to be stable without signs of increased anxiety or agitation.    [name]. Biagio Borg  [facility] [phone] 8 am - 5 pm) [phone] (after hours)

## 2015-05-03 ENCOUNTER — Non-Acute Institutional Stay (SKILLED_NURSING_FACILITY): Payer: Medicare Other | Admitting: Nurse Practitioner

## 2015-05-03 DIAGNOSIS — F0391 Unspecified dementia with behavioral disturbance: Secondary | ICD-10-CM | POA: Diagnosis not present

## 2015-05-03 DIAGNOSIS — E559 Vitamin D deficiency, unspecified: Secondary | ICD-10-CM

## 2015-05-03 DIAGNOSIS — I10 Essential (primary) hypertension: Secondary | ICD-10-CM | POA: Diagnosis not present

## 2015-05-03 DIAGNOSIS — J301 Allergic rhinitis due to pollen: Secondary | ICD-10-CM

## 2015-05-03 DIAGNOSIS — E785 Hyperlipidemia, unspecified: Secondary | ICD-10-CM

## 2015-05-03 DIAGNOSIS — F03918 Unspecified dementia, unspecified severity, with other behavioral disturbance: Secondary | ICD-10-CM

## 2015-05-03 NOTE — Progress Notes (Signed)
Patient ID: Amanda [name], female   DOB: [birth year], 69 y.o.   MRN: [id number]    Nursing Home Location:  [facility]   Place of Service: SNF (31)  PCP: Katy Apo, MD  No Known Allergies  Chief Complaint  Patient presents with  . Medical Management of Chronic Issues    HPI:  Patient is a 69 y.o. female seen today at [facility] for routine follow up on chronic conditions. Pt with a PMH of renal insufficieny, dementia with behaviors, HTN and psychosis. Pt doing well in the last month. pts husband conts to visit frequently. There has been no acute decline in cognitive or functional status. Pt eating well. Moving bowels without problems. No changes in mood or behaviors.   Review of Systems:  Review of Systems  Unable to perform ROS: Dementia    Past Medical History  Diagnosis Date  . Hypertension   . Stroke ~ 2003    "slight memory loss" (05/18/2012)  . Gout     "?feet" (05/18/2012)  . Dementia     "alzheimer's" (05/18/2012)  . Incontinence of urine   . Wrist fracture, bilateral 05/17/2012    "fell down steps" (05/18/2012)  . Fracture of medial wall of orbit 05/17/2012  . Fall 05/17/2012   Past Surgical History  Procedure Laterality Date  . Vaginal hysterectomy     Social History:   reports that she has been smoking Cigarettes.  She has never used smokeless tobacco. She reports that she drinks alcohol. She reports that she uses illicit drugs (Marijuana).  Family History  Problem Relation Age of Onset  . Dementia Mother   . Dementia Father   . Dementia Sister   . Dementia Brother     Medications: Patient's Medications  New Prescriptions   No medications on file  Previous Medications   ACETAMINOPHEN (TYLENOL) 325 MG TABLET    Take 650 mg by mouth every 6 (six) hours as needed for moderate pain or fever.   AMLODIPINE (NORVASC) 10 MG TABLET    Take 10 mg by mouth daily.   ATORVASTATIN (LIPITOR) 10 MG TABLET    Take 10 mg by  mouth daily.   CLONIDINE (CATAPRES) 0.1 MG TABLET    Take 0.1 mg by mouth 3 (three) times daily.    DEXTROMETHORPHAN 7.5 MG/5ML SYRP    2 TSP every 6 hours as needed for cough   DONEPEZIL (ARICEPT) 10 MG TABLET    Take 10 mg by mouth at bedtime.   HYDROCODONE-ACETAMINOPHEN (NORCO) 5-325 MG PER TABLET    Take 1 by mouth every 6 hours as needed for mild to moderate pain,Take two tablets by mouth every 6 hours as needed for severe pain   LABETALOL (NORMODYNE) 300 MG TABLET    Take 300 mg by mouth 2 (two) times daily.   LORATADINE (CLARITIN) 10 MG TABLET    Take 10 mg by mouth daily.   LORAZEPAM (LORAZEPAM INTENSOL) 2 MG/ML CONCENTRATED SOLUTION    Place 0.3 mLs (0.6 mg total) into feeding tube every 8 (eight) hours. May use tablet form also   MEMANTINE (NAMENDA XR) 14 MG CP24 24 HR CAPSULE    Take 14 mg by mouth.   MULTIPLE MINERALS-VITAMINS (CALCIUM & VIT D3 BONE HEALTH PO)    Take by mouth daily.   UNABLE TO FIND    Med Name: Med Pass 60 ml by mouth daily for supplement  Modified Medications   No medications on file  Discontinued Medications   No medications on file     Physical Exam: Filed Vitals:   05/03/15 2003  BP: 115/70  Pulse: 70  Temp: 98.1 F (36.7 C)  Resp: 20  Weight: 144 lb (65.318 kg)    Physical Exam  Constitutional: She appears well-developed and well-nourished. No distress.  HENT:  Head: Normocephalic and atraumatic.  Neck: Neck supple.  Cardiovascular: Normal rate, regular rhythm and normal heart sounds.   Pulmonary/Chest: Effort normal and breath sounds normal.  Abdominal: Soft. Bowel sounds are normal. She exhibits no distension. There is no tenderness.  Musculoskeletal: She exhibits no edema or tenderness.  Neurological: She is alert.  Does not follow commands due to advanced dementia.   Skin: Skin is warm and dry. She is not diaphoretic.  Psychiatric:  Advanced dementia, mumbles      Labs reviewed: Basic Metabolic Panel:  Recent Labs  16/10/96  NA  142  K 3.9  BUN 22*  CREATININE 1.4*   Liver Function Tests:  Recent Labs  12/20/14  AST 11*  ALT 9  ALKPHOS 81   No results for input(s): LIPASE, AMYLASE in the last 8760 hours. No results for input(s): AMMONIA in the last 8760 hours. CBC:  Recent Labs  12/20/14  WBC 7.4  HGB 11.4*  HCT 33*  PLT 267   TSH: No results for input(s): TSH in the last 8760 hours. A1C: No results found for: HGBA1C Lipid Panel:  Recent Labs  12/20/14  CHOL 114  HDL 31*  LDLCALC 66  TRIG 045      Assessment/Plan  1. Essential hypertension, benign Blood pressure remains stable, conts on clonidine, Norvasc and labetalol   2. Allergic rhinitis due to pollen Stable, conts on claritin   3. Dementia with behavioral disturbance Without changes to congitive or functional status. conts on aricept and namenda   4. Hyperlipidemia -LDL at goal in may. -conts on zocor   5. Vitamin D deficiency -conts on vit d supplement.    [name]. Biagio Borg  [facility] [phone] 8 am - 5 pm) [phone] (after hours)

## 2015-05-24 ENCOUNTER — Non-Acute Institutional Stay (SKILLED_NURSING_FACILITY): Payer: Medicare Other | Admitting: Nurse Practitioner

## 2015-05-24 DIAGNOSIS — H109 Unspecified conjunctivitis: Secondary | ICD-10-CM

## 2015-05-24 NOTE — Progress Notes (Signed)
Patient ID: Amanda [name], female   DOB: Nov 14, 1945, 69 y.o.   MRN: [id number]    Nursing Home Location:  [facility]   Place of Service: SNF (31)  PCP: Katy ApoPOLITE,RONALD D, MD  No Known Allergies  Chief Complaint  Patient presents with  . Acute Visit    HPI:  Patient is a 69 y.o. female [facility] for red eye. Pt with a PMH of renal insufficieny, dementia with behaviors, HTN and psychosis. Staff noting red swollen left eye for several days with thick greenish yellow drainage. Pt with dementia and unable to contribute to ROS or HPI   Review of Systems:  Review of Systems  Unable to perform ROS: Dementia    Past Medical History  Diagnosis Date  . Hypertension   . Stroke ~ 2003    "slight memory loss" (05/18/2012)  . Gout     "?feet" (05/18/2012)  . Dementia     "alzheimer's" (05/18/2012)  . Incontinence of urine   . Wrist fracture, bilateral 05/17/2012    "fell down steps" (05/18/2012)  . Fracture of medial wall of orbit 05/17/2012  . Fall 05/17/2012   Past Surgical History  Procedure Laterality Date  . Vaginal hysterectomy     Social History:   reports that she has been smoking Cigarettes.  She has never used smokeless tobacco. She reports that she drinks alcohol. She reports that she uses illicit drugs (Marijuana).  Family History  Problem Relation Age of Onset  . Dementia Mother   . Dementia Father   . Dementia Sister   . Dementia Brother     Medications: Patient's Medications  New Prescriptions   No medications on file  Previous Medications   ACETAMINOPHEN (TYLENOL) 325 MG TABLET    Take 650 mg by mouth every 6 (six) hours as needed for moderate pain or fever.   AMLODIPINE (NORVASC) 10 MG TABLET    Take 10 mg by mouth daily.   ATORVASTATIN (LIPITOR) 10 MG TABLET    Take 10 mg by mouth daily.   CLONIDINE (CATAPRES) 0.1 MG TABLET    Take 0.1 mg by mouth 3 (three) times daily.    DEXTROMETHORPHAN 7.5 MG/5ML SYRP     2 TSP every 6 hours as needed for cough   DONEPEZIL (ARICEPT) 10 MG TABLET    Take 10 mg by mouth at bedtime.   HYDROCODONE-ACETAMINOPHEN (NORCO) 5-325 MG PER TABLET    Take 1 by mouth every 6 hours as needed for mild to moderate pain,Take two tablets by mouth every 6 hours as needed for severe pain   LABETALOL (NORMODYNE) 300 MG TABLET    Take 300 mg by mouth 2 (two) times daily.   LORATADINE (CLARITIN) 10 MG TABLET    Take 10 mg by mouth daily.   LORAZEPAM (LORAZEPAM INTENSOL) 2 MG/ML CONCENTRATED SOLUTION    Place 0.3 mLs (0.6 mg total) into feeding tube every 8 (eight) hours. May use tablet form also   MEMANTINE (NAMENDA XR) 14 MG CP24 24 HR CAPSULE    Take 14 mg by mouth.   MULTIPLE MINERALS-VITAMINS (CALCIUM & VIT D3 BONE HEALTH PO)    Take by mouth daily.   UNABLE TO FIND    Med Name: Med Pass 60 ml by mouth daily for supplement  Modified Medications   No medications on file  Discontinued Medications   No medications on file     Physical Exam: Filed Vitals:   05/24/15  1523  BP: 140/73  Pulse: 78  Temp: 98.1 F (36.7 C)  Resp: 20    Physical Exam  Constitutional: She appears well-developed and well-nourished. No distress.  HENT:  Head: Normocephalic and atraumatic.  Eyes: Left eye exhibits discharge. Left conjunctiva is injected.  Neurological: She is alert.  Does not follow commands due to advanced dementia.   Skin: Skin is warm and dry. She is not diaphoretic.  Psychiatric:  Advanced dementia, mumbles      Labs reviewed: Basic Metabolic Panel:  Recent Labs  16/10/96  NA 142  K 3.9  BUN 22*  CREATININE 1.4*   Liver Function Tests:  Recent Labs  12/20/14  AST 11*  ALT 9  ALKPHOS 81   No results for input(s): LIPASE, AMYLASE in the last 8760 hours. No results for input(s): AMMONIA in the last 8760 hours. CBC:  Recent Labs  12/20/14  WBC 7.4  HGB 11.4*  HCT 33*  PLT 267   TSH: No results for input(s): TSH in the last 8760 hours. A1C: No  results found for: HGBA1C Lipid Panel:  Recent Labs  12/20/14  CHOL 114  HDL 31*  LDLCALC 66  TRIG 045      Assessment/Plan 1. Conjunctivitis of left eye -will treat with gentamycin opthalmic ointment 0.3% 0.5 ribbon into bilateral eyes TID for 7 days.  -nursing to notify if redness, swelling and drainage does not improve  Braydn Carneiro K. Biagio Borg  [facility] [phone] 8 am - 5 pm) [phone] (after hours)

## 2015-06-07 ENCOUNTER — Non-Acute Institutional Stay (SKILLED_NURSING_FACILITY): Payer: Medicare Other | Admitting: Nurse Practitioner

## 2015-06-07 DIAGNOSIS — F0391 Unspecified dementia with behavioral disturbance: Secondary | ICD-10-CM | POA: Diagnosis not present

## 2015-06-07 DIAGNOSIS — I10 Essential (primary) hypertension: Secondary | ICD-10-CM

## 2015-06-07 DIAGNOSIS — D638 Anemia in other chronic diseases classified elsewhere: Secondary | ICD-10-CM | POA: Diagnosis not present

## 2015-06-07 DIAGNOSIS — F411 Generalized anxiety disorder: Secondary | ICD-10-CM | POA: Diagnosis not present

## 2015-06-07 DIAGNOSIS — N183 Chronic kidney disease, stage 3 unspecified: Secondary | ICD-10-CM

## 2015-06-07 DIAGNOSIS — J301 Allergic rhinitis due to pollen: Secondary | ICD-10-CM | POA: Diagnosis not present

## 2015-06-07 DIAGNOSIS — F03918 Unspecified dementia, unspecified severity, with other behavioral disturbance: Secondary | ICD-10-CM

## 2015-06-07 DIAGNOSIS — H109 Unspecified conjunctivitis: Secondary | ICD-10-CM

## 2015-06-07 NOTE — Progress Notes (Signed)
Patient ID: Amanda [name], female   DOB: 02-07-1946, 69 y.o.   MRN: [id number]    Nursing Home Location:  [name]   Place of Service: SNF (31)  PCP: Katy Apo, MD  No Known Allergies  Chief Complaint  Patient presents with  . Medical Management of Chronic Issues    HPI:  Patient is a 69 y.o. female seen today at [name] for routine management of chronic conditions and follow up conjunctivitis.  Pt with a PMH of renal insufficieny, dementia with behaviors, HTN and psychosis. In the last month pt was treated for conjunctivitis. Has completed gentamycin.Husband at bedside and had requested eyes be rechecked after completion of antibiotics.   Swelling, redness and drainage has resolved.  Mood has been stable. No constipation. Without complaints of pain.  Nursing without acute concerns at this time.   Review of Systems:  Review of Systems  Unable to perform ROS: Dementia    Past Medical History  Diagnosis Date  . Hypertension   . Stroke ~ 2003    "slight memory loss" (05/18/2012)  . Gout     "?feet" (05/18/2012)  . Dementia     "alzheimer's" (05/18/2012)  . Incontinence of urine   . Wrist fracture, bilateral 05/17/2012    "fell down steps" (05/18/2012)  . Fracture of medial wall of orbit 05/17/2012  . Fall 05/17/2012   Past Surgical History  Procedure Laterality Date  . Vaginal hysterectomy     Social History:   reports that she has been smoking Cigarettes.  She has never used smokeless tobacco. She reports that she drinks alcohol. She reports that she uses illicit drugs (Marijuana).  Family History  Problem Relation Age of Onset  . Dementia Mother   . Dementia Father   . Dementia Sister   . Dementia Brother     Medications: Patient's Medications  New Prescriptions   No medications on file  Previous Medications   ACETAMINOPHEN (TYLENOL) 325 MG TABLET    Take 650 mg by mouth every 6 (six) hours as needed for moderate  pain or fever.   AMLODIPINE (NORVASC) 10 MG TABLET    Take 10 mg by mouth daily.   ATORVASTATIN (LIPITOR) 10 MG TABLET    Take 10 mg by mouth daily.   CLONIDINE (CATAPRES) 0.1 MG TABLET    Take 0.1 mg by mouth 3 (three) times daily.    DEXTROMETHORPHAN 7.5 MG/5ML SYRP    2 TSP every 6 hours as needed for cough   DONEPEZIL (ARICEPT) 10 MG TABLET    Take 10 mg by mouth at bedtime.   HYDROCODONE-ACETAMINOPHEN (NORCO) 5-325 MG PER TABLET    Take 1 by mouth every 6 hours as needed for mild to moderate pain,Take two tablets by mouth every 6 hours as needed for severe pain   LABETALOL (NORMODYNE) 300 MG TABLET    Take 300 mg by mouth 2 (two) times daily.   LORATADINE (CLARITIN) 10 MG TABLET    Take 10 mg by mouth daily.   LORAZEPAM (LORAZEPAM INTENSOL) 2 MG/ML CONCENTRATED SOLUTION    Place 0.3 mLs (0.6 mg total) into feeding tube every 8 (eight) hours. May use tablet form also   MEMANTINE (NAMENDA XR) 14 MG CP24 24 HR CAPSULE    Take 14 mg by mouth.   MULTIPLE MINERALS-VITAMINS (CALCIUM & VIT D3 BONE HEALTH PO)    Take by mouth daily.   UNABLE TO FIND    Med Name: Med  Pass 60 ml by mouth daily for supplement  Modified Medications   No medications on file  Discontinued Medications   No medications on file     Physical Exam: Filed Vitals:   06/07/15 1444  BP: 135/76  Pulse: 74  Temp: 98.5 F (36.9 C)  Resp: 16    Physical Exam  Constitutional: She appears well-developed and well-nourished. No distress.  HENT:  Head: Normocephalic and atraumatic.  Mouth/Throat: Oropharynx is clear and moist.  Eyes: Conjunctivae and EOM are normal. Pupils are equal, round, and reactive to light. Right eye exhibits no discharge. Left eye exhibits no discharge.  Neck: Neck supple.  Cardiovascular: Normal rate, regular rhythm and normal heart sounds.   Pulmonary/Chest: Effort normal and breath sounds normal.  Abdominal: Soft. Bowel sounds are normal. She exhibits no distension. There is no tenderness.    Musculoskeletal: She exhibits no edema or tenderness.  Neurological: She is alert.  Does not follow commands due to advanced dementia.   Skin: Skin is warm and dry. She is not diaphoretic.  Psychiatric:  Advanced dementia, mumbles      Labs reviewed: Basic Metabolic Panel:  Recent Labs  16/05/9606/18/16  NA 142  K 3.9  BUN 22*  CREATININE 1.4*   Liver Function Tests:  Recent Labs  12/20/14  AST 11*  ALT 9  ALKPHOS 81   No results for input(s): LIPASE, AMYLASE in the last 8760 hours. No results for input(s): AMMONIA in the last 8760 hours. CBC:  Recent Labs  12/20/14  WBC 7.4  HGB 11.4*  HCT 33*  PLT 267   TSH: No results for input(s): TSH in the last 8760 hours. A1C: No results found for: HGBA1C Lipid Panel:  Recent Labs  12/20/14  CHOL 114  HDL 31*  LDLCALC 66  TRIG [id number]      Assessment/Plan  1. Essential hypertension, benign Blood pressure stable on labetalol, norvasc and catapres   2. Allergic rhinitis due to pollen Remains stable on Claritin daily   3. Dementia with behavioral disturbance Advanced dementia but stable in the last month without significantly cognitive or functional decline.   4. CKD (chronic kidney disease) stage 3, GFR 30-59 ml/min Stable, encourage good hydration, avoid NSAID, will follow up bmp next month  5. Anemia of chronic disease -stable on lab labs, will follow up CBC next month  6. Anxiety state Remains stable, off all scheduled medications, conts PRN ativan   7. Conjunctivitis of left eye Significantly improved. Has completed gentamycin ointment.   Janene HarveyJessica K. Biagio BorgEubanks, AGNP  [facility] [phone](Monday-Friday 8 am - 5 pm) [phone] (after hours)

## 2015-06-13 ENCOUNTER — Encounter: Payer: Self-pay | Admitting: Internal Medicine

## 2015-06-13 ENCOUNTER — Non-Acute Institutional Stay (SKILLED_NURSING_FACILITY): Payer: Medicare Other | Admitting: Internal Medicine

## 2015-06-13 DIAGNOSIS — H109 Unspecified conjunctivitis: Secondary | ICD-10-CM | POA: Diagnosis not present

## 2015-06-13 DIAGNOSIS — F03918 Unspecified dementia, unspecified severity, with other behavioral disturbance: Secondary | ICD-10-CM

## 2015-06-13 DIAGNOSIS — F0391 Unspecified dementia with behavioral disturbance: Secondary | ICD-10-CM | POA: Diagnosis not present

## 2015-06-13 NOTE — Progress Notes (Signed)
Patient ID: Amanda [name], female   DOB: [birth year], 69 y.o.   MRN: [id number]    DATE: 06/13/15  Location:  [facility]    Place of Service: SNF (31)   Extended Emergency Contact Information Primary Emergency Contact: Apachito,[name] Address: [address]          [place], [place] [address] [place] Home Phone: [phone] Mobile Phone: [phone] Relation: Spouse Secondary Emergency Contact: Romeo AppleWilliamson,[name] J  [place] Mobile Phone: [phone] Relation: Son  Advanced Directive information  FULL CODE  Chief Complaint  Patient presents with  . Follow-up    check eyes    HPI:  69 yo female long term resident seen today for f/u on eye infection. Her son is present and is c/a whther eye infection cleared. She completed 7 days of eye gtts. She was started on gentamicin eye gtts on 10/14th. Pt has no c/o eye pain. No nursing issues. She has dementia and takes namenda and aricept. She is a oor historian due to dementia. Hx obtained from chart  Past Medical History  Diagnosis Date  . Hypertension   . Stroke [facility](HCC) ~ 2003    "slight memory loss" (05/18/2012)  . Gout     "?feet" (05/18/2012)  . Dementia     "alzheimer's" (05/18/2012)  . Incontinence of urine   . Wrist fracture, bilateral 05/17/2012    "fell down steps" (05/18/2012)  . Fracture of medial wall of orbit (HCC) 05/17/2012  . Fall 05/17/2012    Past Surgical History  Procedure Laterality Date  . Vaginal hysterectomy      Patient Care Team: Renford Dillsonald Polite, MD as PCP - General (Internal Medicine)  Social History   Social History  . Marital Status: Married    Spouse Name: N/A  . Number of Children: N/A  . Years of Education: N/A   Occupational History  . Not on file.   Social History Main Topics  . Smoking status: Current Some Day Smoker    Types: Cigarettes  . Smokeless tobacco: Never Used     Comment: 05/18/2012 "used to smoke like a fish; now  smokes 1-2 cigarettes/wk; has smoked 30-40 years"  . Alcohol Use: Yes     Comment: 05/18/2012 "probably drank 3 beers/wk til maybe 2 yr ago"  . Drug Use: Yes    Special: Marijuana     Comment: 05/18/2012 "used some marijuana when she was younger"  . Sexual Activity: Not on file   Other Topics Concern  . Not on file   Social History Narrative     reports that she has been smoking Cigarettes.  She has never used smokeless tobacco. She reports that she drinks alcohol. She reports that she uses illicit drugs (Marijuana).  Immunization History  Administered Date(s) Administered  . Influenza-Unspecified 05/10/2013, 05/11/2014  . Pneumococcal-Unspecified 06/03/2012    No Known Allergies  Medications: Patient's Medications  New Prescriptions   No medications on file  Previous Medications   ACETAMINOPHEN (TYLENOL) 325 MG TABLET    Take 650 mg by mouth every 6 (six) hours as needed for moderate pain or fever.   AMLODIPINE (NORVASC) 10 MG TABLET    Take 10 mg by mouth daily.   ATORVASTATIN (LIPITOR) 10 MG TABLET    Take 10 mg by mouth daily.   CLONIDINE (CATAPRES) 0.1 MG TABLET    Take 0.1 mg by mouth 3 (three) times daily.    DEXTROMETHORPHAN 7.5 MG/5ML SYRP    2 TSP  every 6 hours as needed for cough   DONEPEZIL (ARICEPT) 10 MG TABLET    Take 10 mg by mouth at bedtime.   HYDROCODONE-ACETAMINOPHEN (NORCO) 5-325 MG PER TABLET    Take 1 by mouth every 6 hours as needed for mild to moderate pain,Take two tablets by mouth every 6 hours as needed for severe pain   LABETALOL (NORMODYNE) 300 MG TABLET    Take 300 mg by mouth 2 (two) times daily.   LORATADINE (CLARITIN) 10 MG TABLET    Take 10 mg by mouth daily.   LORAZEPAM (LORAZEPAM INTENSOL) 2 MG/ML CONCENTRATED SOLUTION    Place 0.3 mLs (0.6 mg total) into feeding tube every 8 (eight) hours. May use tablet form also   MEMANTINE (NAMENDA XR) 14 MG CP24 24 HR CAPSULE    Take 14 mg by mouth.   MULTIPLE MINERALS-VITAMINS (CALCIUM & VIT D3 BONE  HEALTH PO)    Take by mouth daily.   UNABLE TO FIND    Med Name: Med Pass 60 ml by mouth daily for supplement  Modified Medications   No medications on file  Discontinued Medications   No medications on file    Review of Systems  Unable to perform ROS: Dementia    Filed Vitals:   06/13/15 1425  BP: 103/63  Pulse: 58  Temp: 98.9 F (37.2 C)  Weight: 137 lb 3.2 oz (62.234 kg)   Body mass index is 25.94 kg/(m^2).  Physical Exam  Constitutional:  Frail appearing sitting in w/c, Nad  Eyes: Pupils are equal, round, and reactive to light.  No d/c, NT, no redness or crusting  Neurological: She is alert.  Skin: Skin is warm and dry. No rash noted.  Psychiatric: She has a normal mood and affect. Her behavior is normal.     Labs reviewed: Nursing Home on 03/29/2015  Component Date Value Ref Range Status  . Hemoglobin 12/20/2014 11.4* 12.0 - 16.0 g/dL Final  . HCT 16/05/9603 33* 36 - 46 % Final  . Platelets 12/20/2014 267  150 - 399 K/L Final  . WBC 12/20/2014 7.4   Final  . Glucose 02/25/2015 107   Final  . BUN 02/25/2015 22* 4 - 21 mg/dL Final  . Creatinine 54/04/8118 1.4* 0.5 - 1.1 mg/dL Final  . Potassium 14/78/2956 3.9  3.4 - 5.3 mmol/L Final  . Sodium 02/25/2015 142  137 - 147 mmol/L Final  . Triglycerides 12/20/2014 144  40 - 160 mg/dL Final  . Cholesterol 21/30/8657 114  0 - 200 mg/dL Final  . HDL 84/69/6295 31* 35 - 70 mg/dL Final  . LDL Cholesterol 12/20/2014 66   Final  . Alkaline Phosphatase 12/20/2014 81  25 - 125 U/L Final  . ALT 12/20/2014 9  7 - 35 U/L Final  . AST 12/20/2014 11* 13 - 35 U/L Final  . Bilirubin, Direct 12/20/2014 0.1  0.01 - 0.4 mg/dL Final  . Bilirubin, Total 12/20/2014 0.3   Final    No results found.   Assessment/Plan   ICD-9-CM ICD-10-CM   1. Conjunctivitis of left eye 372.30 H10.9   2. Dementia with behavioral disturbance 294.21 F03.91    . Reassurance given  Cont current meds. She completed eye gtts  Will follow  Jibril Mcminn  S. Ancil Linsey  [facility] [address] [place], [place] [address] [phone] Cell (Monday-Friday 8 AM - 5 PM) [phone] After 5 PM and follow prompts

## 2015-06-20 LAB — BASIC METABOLIC PANEL
BUN: 25 mg/dL — AB (ref 4–21)
CREATININE: 1.7 mg/dL — AB (ref 0.5–1.1)
Glucose: 101 mg/dL
POTASSIUM: 4.1 mmol/L (ref 3.4–5.3)
Sodium: 142 mmol/L (ref 137–147)

## 2015-07-10 ENCOUNTER — Non-Acute Institutional Stay (SKILLED_NURSING_FACILITY): Payer: Medicare Other | Admitting: Nurse Practitioner

## 2015-07-10 ENCOUNTER — Encounter: Payer: Self-pay | Admitting: Nurse Practitioner

## 2015-07-10 DIAGNOSIS — N183 Chronic kidney disease, stage 3 unspecified: Secondary | ICD-10-CM

## 2015-07-10 DIAGNOSIS — F0391 Unspecified dementia with behavioral disturbance: Secondary | ICD-10-CM | POA: Diagnosis not present

## 2015-07-10 DIAGNOSIS — I10 Essential (primary) hypertension: Secondary | ICD-10-CM

## 2015-07-10 DIAGNOSIS — J301 Allergic rhinitis due to pollen: Secondary | ICD-10-CM

## 2015-07-10 DIAGNOSIS — F411 Generalized anxiety disorder: Secondary | ICD-10-CM | POA: Diagnosis not present

## 2015-07-10 DIAGNOSIS — F03918 Unspecified dementia, unspecified severity, with other behavioral disturbance: Secondary | ICD-10-CM

## 2015-07-10 NOTE — Progress Notes (Signed)
Patient ID: Amanda [name], female   DOB: Mar 24, 1946, 69 y.o.   MRN: [id number]    Nursing Home Location:  [facility]   Place of Service: SNF (31)  PCP: Katy Apo, MD  No Known Allergies  Chief Complaint  Patient presents with  . Medical Management of Chronic Issues    Routine Visit     HPI:  Patient is a 69 y.o. female seen today at [facility] for routine management of chronic conditions. Pt with a PMH of renal insufficieny, dementia with behaviors, HTN and psychosis. Pt has been stable in the last month. She has been seen by nephrology due to worsen renal function. Ongoing monitoring at this time. Pt had dental follow up in the last month. Mood has been stable. No acute concerns per staff.   Review of Systems:  Review of Systems  Unable to perform ROS: Dementia    Past Medical History  Diagnosis Date  . Hypertension   . Stroke [facility]) ~ 2003    "slight memory loss" (05/18/2012)  . Gout     "?feet" (05/18/2012)  . Dementia     "alzheimer's" (05/18/2012)  . Incontinence of urine   . Wrist fracture, bilateral 05/17/2012    "fell down steps" (05/18/2012)  . Fracture of medial wall of orbit (HCC) 05/17/2012  . Fall 05/17/2012   Past Surgical History  Procedure Laterality Date  . Vaginal hysterectomy     Social History:   reports that she has been smoking Cigarettes.  She has never used smokeless tobacco. She reports that she drinks alcohol. She reports that she uses illicit drugs (Marijuana).  Family History  Problem Relation Age of Onset  . Dementia Mother   . Dementia Father   . Dementia Sister   . Dementia Brother     Medications: Patient's Medications  New Prescriptions   No medications on file  Previous Medications   ACETAMINOPHEN (TYLENOL) 325 MG TABLET    Take 650 mg by mouth every 6 (six) hours as needed for moderate pain or fever.   AMLODIPINE (NORVASC) 10 MG TABLET    Take 10 mg by mouth daily.   ATORVASTATIN  (LIPITOR) 10 MG TABLET    Take 10 mg by mouth daily.   CALCIUM-VITAMIN D PO    Take 500 mg by mouth daily.   CLONIDINE (CATAPRES) 0.1 MG TABLET    Take 0.1 mg by mouth 3 (three) times daily.    DEXTROMETHORPHAN 7.5 MG/5ML SYRP    2 TSP every 6 hours as needed for cough   DONEPEZIL (ARICEPT) 10 MG TABLET    Take 10 mg by mouth at bedtime.   HYDROCODONE-ACETAMINOPHEN (NORCO) 5-325 MG PER TABLET    Take 1 by mouth every 6 hours as needed for mild to moderate pain,Take two tablets by mouth every 6 hours as needed for severe pain   LABETALOL (NORMODYNE) 300 MG TABLET    Take 300 mg by mouth 2 (two) times daily.   LORATADINE (CLARITIN) 10 MG TABLET    Take 10 mg by mouth daily.   LORAZEPAM (LORAZEPAM INTENSOL) 2 MG/ML CONCENTRATED SOLUTION    Place 0.3 mLs (0.6 mg total) into feeding tube every 8 (eight) hours. May use tablet form also   MEMANTINE (NAMENDA XR) 14 MG CP24 24 HR CAPSULE    Take 14 mg by mouth.  Modified Medications   No medications on file  Discontinued Medications   MULTIPLE MINERALS-VITAMINS (CALCIUM & VIT D3 BONE  HEALTH PO)    Take by mouth daily.   UNABLE TO FIND    Med Name: Med Pass 60 ml by mouth daily for supplement     Physical Exam: Filed Vitals:   07/10/15 1108  BP: 124/66  Pulse: 84  Temp: 97.9 F (36.6 C)  TempSrc: Oral  Resp: 18  Weight: 141 lb (63.957 kg)    Physical Exam  Constitutional: She appears well-developed and well-nourished. No distress.  HENT:  Head: Normocephalic and atraumatic.  Mouth/Throat: Oropharynx is clear and moist.  Grinds teeth  Eyes: Conjunctivae and EOM are normal. Pupils are equal, round, and reactive to light. Right eye exhibits no discharge. Left eye exhibits no discharge.  Neck: Neck supple.  Cardiovascular: Normal rate, regular rhythm and normal heart sounds.   Pulmonary/Chest: Effort normal and breath sounds normal.  Abdominal: Soft. Bowel sounds are normal. She exhibits no distension. There is no tenderness.    Musculoskeletal: She exhibits no edema or tenderness.  Neurological: She is alert.  Does not follow commands due to advanced dementia.   Skin: Skin is warm and dry. She is not diaphoretic.  Psychiatric:  Advanced dementia, mumbles      Labs reviewed: Basic Metabolic Panel:  Recent Labs  81/19/1407/18/16  NA 142  K 3.9  BUN 22*  CREATININE 1.4*   Liver Function Tests:  Recent Labs  12/20/14  AST 11*  ALT 9  ALKPHOS 81   No results for input(s): LIPASE, AMYLASE in the last 8760 hours. No results for input(s): AMMONIA in the last 8760 hours. CBC:  Recent Labs  12/20/14  WBC 7.4  HGB 11.4*  HCT 33*  PLT 267   TSH: No results for input(s): TSH in the last 8760 hours. A1C: No results found for: HGBA1C Lipid Panel:  Recent Labs  12/20/14  CHOL 114  HDL 31*  LDLCALC 66  TRIG [id number]     Assessment/Plan 1. Essential hypertension, benign -blood pressure remains well controlled. conts on catapres, Norvasc, labetalol at this time  2. Non-seasonal allergic rhinitis due to pollen -remains stable on Claritin 10 mg daily, will cont at this itme.  3. CKD (chronic kidney disease) stage 3, GFR 30-59 ml/min -stable, followed with nephrology without new intervention. Will monitor  4. Dementia with behavioral disturbance No acute changes to cognitive or functional status. Slow decline anticipated.   5. Anxiety state Remains stable, off all medication scheduled medications at this time. conts on ativan as needed.    Janene HarveyJessica K. Biagio BorgEubanks, AGNP  [facility] [phone](Monday-Friday 8 am - 5 pm) [phone] (after hours)

## 2015-07-17 LAB — LIPID PANEL
Cholesterol: 145 mg/dL (ref 0–200)
HDL: 38 mg/dL (ref 35–70)
LDL Cholesterol: 80 mg/dL
Triglycerides: 136 mg/dL (ref 40–160)

## 2015-07-17 LAB — HEPATIC FUNCTION PANEL
ALT: 13 U/L (ref 7–35)
AST: 17 U/L (ref 13–35)
Alkaline Phosphatase: 103 U/L (ref 25–125)
BILIRUBIN, TOTAL: 0.4 mg/dL

## 2015-07-18 LAB — LIPID PANEL
Cholesterol: 145 mg/dL (ref 0–200)
HDL: 38 mg/dL (ref 35–70)
LDL CALC: 80 mg/dL
TRIGLYCERIDES: 136 mg/dL (ref 40–160)

## 2015-07-18 LAB — HEPATIC FUNCTION PANEL
ALT: 13 U/L (ref 7–35)
AST: 17 U/L (ref 13–35)
Alkaline Phosphatase: 103 U/L (ref 25–125)
Bilirubin, Total: 0.4 mg/dL

## 2015-08-16 ENCOUNTER — Non-Acute Institutional Stay (SKILLED_NURSING_FACILITY): Payer: Medicare Other | Admitting: Nurse Practitioner

## 2015-08-16 DIAGNOSIS — J301 Allergic rhinitis due to pollen: Secondary | ICD-10-CM

## 2015-08-16 DIAGNOSIS — D638 Anemia in other chronic diseases classified elsewhere: Secondary | ICD-10-CM

## 2015-08-16 DIAGNOSIS — N183 Chronic kidney disease, stage 3 unspecified: Secondary | ICD-10-CM

## 2015-08-16 DIAGNOSIS — F411 Generalized anxiety disorder: Secondary | ICD-10-CM | POA: Diagnosis not present

## 2015-08-16 DIAGNOSIS — F0391 Unspecified dementia with behavioral disturbance: Secondary | ICD-10-CM | POA: Diagnosis not present

## 2015-08-16 DIAGNOSIS — I10 Essential (primary) hypertension: Secondary | ICD-10-CM

## 2015-08-16 DIAGNOSIS — E785 Hyperlipidemia, unspecified: Secondary | ICD-10-CM

## 2015-08-16 DIAGNOSIS — F03918 Unspecified dementia, unspecified severity, with other behavioral disturbance: Secondary | ICD-10-CM

## 2015-08-16 NOTE — Progress Notes (Signed)
Patient ID: Amanda [name], female   DOB: 03-22-1946, 70 y.o.   MRN: [id number]    Nursing Home Location:  [facility]   Place of Service: SNF (31)  PCP: Katy ApoPOLITE,RONALD D, MD  No Known Allergies  Chief Complaint  Patient presents with  . Medical Management of Chronic Issues    HPI:  Patient is a 70 y.o. female seen today at [facility] for routine management of chronic conditions. Pt with a PMH of renal insufficieny, dementia with behaviors, HTN and psychosis. Pt has remained stable in the last month. Husband conts to visit pt and helps with meal time. There has been no changes in mood or behaviors. Pt conts to eat well. No changes in bowel or bladder. Pt does not exhibit signs of or complain of pain.  Review of Systems:  Review of Systems  Unable to perform ROS: Dementia    Past Medical History  Diagnosis Date  . Hypertension   . Stroke [facility](HCC) ~ 2003    "slight memory loss" (05/18/2012)  . Gout     "?feet" (05/18/2012)  . Dementia     "alzheimer's" (05/18/2012)  . Incontinence of urine   . Wrist fracture, bilateral 05/17/2012    "fell down steps" (05/18/2012)  . Fracture of medial wall of orbit (HCC) 05/17/2012  . Fall 05/17/2012   Past Surgical History  Procedure Laterality Date  . Vaginal hysterectomy     Social History:   reports that she has been smoking Cigarettes.  She has never used smokeless tobacco. She reports that she drinks alcohol. She reports that she uses illicit drugs (Marijuana).  Family History  Problem Relation Age of Onset  . Dementia Mother   . Dementia Father   . Dementia Sister   . Dementia Brother     Medications: Patient's Medications  New Prescriptions   No medications on file  Previous Medications   ACETAMINOPHEN (TYLENOL) 325 MG TABLET    Take 650 mg by mouth every 6 (six) hours as needed for moderate pain or fever.   AMLODIPINE (NORVASC) 10 MG TABLET    Take 10 mg by mouth daily.   ATORVASTATIN  (LIPITOR) 10 MG TABLET    Take 10 mg by mouth daily.   CALCIUM-VITAMIN D PO    Take 500 mg by mouth daily.   CLONIDINE (CATAPRES) 0.1 MG TABLET    Take 0.1 mg by mouth 3 (three) times daily.    DEXTROMETHORPHAN 7.5 MG/5ML SYRP    2 TSP every 6 hours as needed for cough   DONEPEZIL (ARICEPT) 10 MG TABLET    Take 10 mg by mouth at bedtime.   HYDROCODONE-ACETAMINOPHEN (NORCO) 5-325 MG PER TABLET    Take 1 by mouth every 6 hours as needed for mild to moderate pain,Take two tablets by mouth every 6 hours as needed for severe pain   LABETALOL (NORMODYNE) 300 MG TABLET    Take 300 mg by mouth 2 (two) times daily.   LORATADINE (CLARITIN) 10 MG TABLET    Take 10 mg by mouth daily.   LORAZEPAM (LORAZEPAM INTENSOL) 2 MG/ML CONCENTRATED SOLUTION    Place 0.3 mLs (0.6 mg total) into feeding tube every 8 (eight) hours. May use tablet form also   MEMANTINE (NAMENDA XR) 14 MG CP24 24 HR CAPSULE    Take 14 mg by mouth.  Modified Medications   No medications on file  Discontinued Medications   No medications on file  Physical Exam: Filed Vitals:   08/16/15 1323  BP: 123/78  Pulse: 60  Temp: 97.8 F (36.6 C)  Resp: 20  Weight: 139 lb (63.05 kg)    Physical Exam  Constitutional: She appears well-developed and well-nourished. No distress.  HENT:  Head: Normocephalic and atraumatic.  Nose: Rhinorrhea present.  Mouth/Throat: Oropharynx is clear and moist.  Eyes: Conjunctivae and EOM are normal. Pupils are equal, round, and reactive to light. Right eye exhibits no discharge. Left eye exhibits no discharge.  Neck: Neck supple.  Cardiovascular: Normal rate, regular rhythm and normal heart sounds.   Pulmonary/Chest: Effort normal and breath sounds normal.  Abdominal: Soft. Bowel sounds are normal. She exhibits no distension. There is no tenderness.  Musculoskeletal: She exhibits no edema or tenderness.  Neurological: She is alert.  Does not follow commands due to advanced dementia.   Skin: Skin is  warm and dry. She is not diaphoretic.  Psychiatric:  Advanced dementia, mumbles      Labs reviewed: Basic Metabolic Panel:  Recent Labs  96/04/54 06/20/15  NA 142 142  K 3.9 4.1  BUN 22* 25*  CREATININE 1.4* 1.7*   Liver Function Tests:  Recent Labs  12/20/14 07/17/15  AST 11* 17  ALT 9 13  ALKPHOS 81 103   No results for input(s): LIPASE, AMYLASE in the last 8760 hours. No results for input(s): AMMONIA in the last 8760 hours. CBC:  Recent Labs  12/20/14  WBC 7.4  HGB 11.4*  HCT 33*  PLT 267   TSH: No results for input(s): TSH in the last 8760 hours. A1C: No results found for: HGBA1C Lipid Panel:  Recent Labs  12/20/14 07/17/15  CHOL 114 145  HDL 31* 38  LDLCALC 66 80  TRIG 144 136     Assessment/Plan 1. Essential hypertension, benign Blood pressure remains stable, conts on norvasc, catapres and labetalol   2. Allergic rhinitis due to pollen -stable, conts on Claritin daily   3. Dementia with behavioral disturbance -stable, without worsening behavior or mood, conts on namenda and aricept  4. CKD (chronic kidney disease) stage 3, GFR 30-59 ml/min Stable, avoiding dehydration and NSAIDs, has followed with nephrology in the past  5. Anxiety state Remains stable, not on any routine medication at this time. Will monitor   6. Hyperlipidemia -LDL stable in Dec 2016, will cont lipitor  7. Anemia of chronic disease Remains stable, will monitor   Jessica K. Biagio Borg  [facility] [phone] 8 am - 5 pm) [phone] (after hours)

## 2015-09-20 ENCOUNTER — Non-Acute Institutional Stay (SKILLED_NURSING_FACILITY): Payer: Medicare Other | Admitting: Nurse Practitioner

## 2015-09-20 DIAGNOSIS — I10 Essential (primary) hypertension: Secondary | ICD-10-CM | POA: Diagnosis not present

## 2015-09-20 DIAGNOSIS — E559 Vitamin D deficiency, unspecified: Secondary | ICD-10-CM

## 2015-09-20 DIAGNOSIS — N183 Chronic kidney disease, stage 3 unspecified: Secondary | ICD-10-CM

## 2015-09-20 DIAGNOSIS — F0391 Unspecified dementia with behavioral disturbance: Secondary | ICD-10-CM

## 2015-09-20 DIAGNOSIS — D638 Anemia in other chronic diseases classified elsewhere: Secondary | ICD-10-CM | POA: Diagnosis not present

## 2015-09-20 DIAGNOSIS — E785 Hyperlipidemia, unspecified: Secondary | ICD-10-CM

## 2015-09-20 DIAGNOSIS — F03918 Unspecified dementia, unspecified severity, with other behavioral disturbance: Secondary | ICD-10-CM

## 2015-09-20 NOTE — Progress Notes (Signed)
Patient ID: Amanda [name], female   DOB: 02/23/1946, 70 y.o.   MRN: [id number]    Nursing Home Location:  [facility]   Place of Service: SNF (31)  PCP: Amanda Apo, MD  No Known Allergies  Chief Complaint  Patient presents with  . Medical Management of Chronic Issues    HPI:  Patient is a 70 y.o. female seen today at [facility] for routine management of chronic conditions. Pt with a PMH of renal insufficieny, dementia with behaviors, HTN and psychosis. Pt has been stable in the last month. Husband conts to come during meal times to assist with feedings. Behaviors have been stable without increase anxiety. Moving bowels without difficulty. Staff without acute concerns.   Review of Systems:  Review of Systems  Unable to perform ROS: Dementia    Past Medical History  Diagnosis Date  . Hypertension   . Stroke [facility]) ~ 2003    "slight memory loss" (05/18/2012)  . Gout     "?feet" (05/18/2012)  . Dementia     "alzheimer's" (05/18/2012)  . Incontinence of urine   . Wrist fracture, bilateral 05/17/2012    "fell down steps" (05/18/2012)  . Fracture of medial wall of orbit (HCC) 05/17/2012  . Fall 05/17/2012   Past Surgical History  Procedure Laterality Date  . Vaginal hysterectomy     Social History:   reports that she has been smoking Cigarettes.  She has never used smokeless tobacco. She reports that she drinks alcohol. She reports that she uses illicit drugs (Marijuana).  Family History  Problem Relation Age of Onset  . Dementia Mother   . Dementia Father   . Dementia Sister   . Dementia Brother     Medications: Patient's Medications  New Prescriptions   No medications on file  Previous Medications   ACETAMINOPHEN (TYLENOL) 325 MG TABLET    Take 650 mg by mouth every 6 (six) hours as needed for moderate pain or fever.   AMLODIPINE (NORVASC) 10 MG TABLET    Take 10 mg by mouth daily.   ATORVASTATIN (LIPITOR) 10 MG TABLET     Take 10 mg by mouth daily.   CALCIUM-VITAMIN D PO    Take 500 mg by mouth daily.   CLONIDINE (CATAPRES) 0.1 MG TABLET    Take 0.1 mg by mouth 3 (three) times daily.    DEXTROMETHORPHAN 7.5 MG/5ML SYRP    2 TSP every 6 hours as needed for cough   DONEPEZIL (ARICEPT) 10 MG TABLET    Take 10 mg by mouth at bedtime.   HYDROCODONE-ACETAMINOPHEN (NORCO) 5-325 MG PER TABLET    Take 1 by mouth every 6 hours as needed for mild to moderate pain,Take two tablets by mouth every 6 hours as needed for severe pain   LABETALOL (NORMODYNE) 300 MG TABLET    Take 300 mg by mouth 2 (two) times daily.   LORATADINE (CLARITIN) 10 MG TABLET    Take 10 mg by mouth daily.   LORAZEPAM (LORAZEPAM INTENSOL) 2 MG/ML CONCENTRATED SOLUTION    Place 0.3 mLs (0.6 mg total) into feeding tube every 8 (eight) hours. May use tablet form also   MEMANTINE (NAMENDA XR) 14 MG CP24 24 HR CAPSULE    Take 14 mg by mouth.  Modified Medications   No medications on file  Discontinued Medications   No medications on file     Physical Exam: Filed Vitals:   09/20/15 1203  BP: 108/58  Pulse:  92  Temp: 98 F (36.7 C)  Resp: 18  Weight: 141 lb (63.957 kg)    Physical Exam  Constitutional: She appears well-developed and well-nourished. No distress.  HENT:  Head: Normocephalic and atraumatic.  Nose: Rhinorrhea present.  Mouth/Throat: Oropharynx is clear and moist.  Eyes: Conjunctivae and EOM are normal. Pupils are equal, round, and reactive to light. Right eye exhibits no discharge. Left eye exhibits no discharge.  Neck: Neck supple.  Cardiovascular: Normal rate, regular rhythm and normal heart sounds.   Pulmonary/Chest: Effort normal and breath sounds normal.  Abdominal: Soft. Bowel sounds are normal. She exhibits no distension. There is no tenderness.  Musculoskeletal: She exhibits no edema or tenderness.  Neurological: She is alert.  Does not follow commands due to advanced dementia.   Skin: Skin is warm and dry. She is not  diaphoretic.  Psychiatric:  Advanced dementia, mumbles      Labs reviewed: Basic Metabolic Panel:  Recent Labs  16/10/96 06/20/15  NA 142 142  K 3.9 4.1  BUN 22* 25*  CREATININE 1.4* 1.7*   Liver Function Tests:  Recent Labs  12/20/14 07/17/15  AST 11* 17  ALT 9 13  ALKPHOS 81 103   No results for input(s): LIPASE, AMYLASE in the last 8760 hours. No results for input(s): AMMONIA in the last 8760 hours. CBC:  Recent Labs  12/20/14  WBC 7.4  HGB 11.4*  HCT 33*  PLT 267   TSH: No results for input(s): TSH in the last 8760 hours. A1C: No results found for: HGBA1C Lipid Panel:  Recent Labs  12/20/14 07/17/15  CHOL 114 145  HDL 31* 38  LDLCALC 66 80  TRIG 144 136     Assessment/Plan 1. Essential hypertension, benign Blood pressure remains stable, conts on current regimen  2. Dementia with behavioral disturbance Remains stable, without acute change in cognitive or functional status.  conts on namenda and aricept, requires assistance with all ADLs  3. CKD (chronic kidney disease) stage 3, GFR 30-59 ml/min Stable on recent lab, staff to encourage hydration  4. Vitamin D deficiency conts on vit d supplement  5. Hyperlipidemia conts on lipitor, LDL at goal   6. Anemia of chronic disease hgb remains stable.     [name]. Biagio Borg  [facility] [phone] 8 am - 5 pm) [phone] (after hours)

## 2015-10-16 ENCOUNTER — Non-Acute Institutional Stay (SKILLED_NURSING_FACILITY): Payer: Medicare Other | Admitting: Nurse Practitioner

## 2015-10-16 ENCOUNTER — Encounter: Payer: Self-pay | Admitting: Nurse Practitioner

## 2015-10-16 DIAGNOSIS — J301 Allergic rhinitis due to pollen: Secondary | ICD-10-CM | POA: Diagnosis not present

## 2015-10-16 DIAGNOSIS — N183 Chronic kidney disease, stage 3 unspecified: Secondary | ICD-10-CM

## 2015-10-16 DIAGNOSIS — F411 Generalized anxiety disorder: Secondary | ICD-10-CM

## 2015-10-16 DIAGNOSIS — E785 Hyperlipidemia, unspecified: Secondary | ICD-10-CM

## 2015-10-16 DIAGNOSIS — E559 Vitamin D deficiency, unspecified: Secondary | ICD-10-CM | POA: Diagnosis not present

## 2015-10-16 DIAGNOSIS — I10 Essential (primary) hypertension: Secondary | ICD-10-CM

## 2015-10-16 NOTE — Progress Notes (Signed)
Patient ID: Amanda [name], female   DOB: October 02, 1945, 70 y.o.   MRN: [id number]    Nursing Home Location:  [facility]   Place of Service: SNF (31)  PCP: Katy ApoPOLITE,RONALD D, MD  No Known Allergies  Chief Complaint  Patient presents with  . Medical Management of Chronic Issues    Routine Visit    HPI:  Patient is a 70 y.o. female seen today at [facility] for routine management of chronic conditions. Pt with a PMH of renal insufficieny, dementia with behaviors, HTN and psychosis. Pt has been doing well since last visit. Maintained usual state of health. Dementia remains stable without acute changes. No increase in behaviors. No changes in appetite, bowel or bladder.   Review of Systems:  Review of Systems  Unable to perform ROS: Dementia    Past Medical History  Diagnosis Date  . Hypertension   . Stroke [facility](HCC) ~ 2003    "slight memory loss" (05/18/2012)  . Gout     "?feet" (05/18/2012)  . Dementia     "alzheimer's" (05/18/2012)  . Incontinence of urine   . Wrist fracture, bilateral 05/17/2012    "fell down steps" (05/18/2012)  . Fracture of medial wall of orbit (HCC) 05/17/2012  . Fall 05/17/2012   Past Surgical History  Procedure Laterality Date  . Vaginal hysterectomy     Social History:   reports that she has been smoking Cigarettes.  She has never used smokeless tobacco. She reports that she drinks alcohol. She reports that she uses illicit drugs (Marijuana).  Family History  Problem Relation Age of Onset  . Dementia Mother   . Dementia Father   . Dementia Sister   . Dementia Brother     Medications: Patient's Medications  New Prescriptions   No medications on file  Previous Medications   ACETAMINOPHEN (TYLENOL) 325 MG TABLET    Take 650 mg by mouth every 6 (six) hours as needed for moderate pain or fever.   AMLODIPINE (NORVASC) 10 MG TABLET    Take 10 mg by mouth daily.   ATORVASTATIN (LIPITOR) 10 MG TABLET    Take 10 mg by  mouth daily.   CALCIUM-VITAMIN D PO    Take 500 mg by mouth daily.   CLONIDINE (CATAPRES) 0.1 MG TABLET    Take 0.1 mg by mouth 3 (three) times daily.    DEXTROMETHORPHAN 7.5 MG/5ML SYRP    2 TSP every 6 hours as needed for cough   DONEPEZIL (ARICEPT) 10 MG TABLET    Take 10 mg by mouth at bedtime.   HYDROCODONE-ACETAMINOPHEN (NORCO) 5-325 MG PER TABLET    Take 1 by mouth every 6 hours as needed for mild to moderate pain,Take two tablets by mouth every 6 hours as needed for severe pain   LABETALOL (NORMODYNE) 300 MG TABLET    Take 300 mg by mouth 2 (two) times daily.   LORATADINE (CLARITIN) 10 MG TABLET    Take 10 mg by mouth daily.   LORAZEPAM (LORAZEPAM INTENSOL) 2 MG/ML CONCENTRATED SOLUTION    Place 0.3 mLs (0.6 mg total) into feeding tube every 8 (eight) hours. May use tablet form also   MEMANTINE (NAMENDA XR) 14 MG CP24 24 HR CAPSULE    Take 14 mg by mouth.  Modified Medications   No medications on file  Discontinued Medications   No medications on file     Physical Exam: Filed Vitals:   10/16/15 1110  BP: 120/63  Pulse: 60  Temp: 98 F (36.7 C)  TempSrc: Oral  Resp: 18  Height:  (1.549 m)  Weight: 141 lb 6.4 oz (64.139 kg)    Physical Exam  Constitutional: She appears well-developed and well-nourished. No distress.  HENT:  Head: Normocephalic and atraumatic.  Nose: Rhinorrhea present.  Mouth/Throat: Oropharynx is clear and moist.  Eyes: Conjunctivae and EOM are normal. Pupils are equal, round, and reactive to light. Right eye exhibits no discharge. Left eye exhibits no discharge.  Neck: Neck supple.  Cardiovascular: Normal rate, regular rhythm and normal heart sounds.   Pulmonary/Chest: Effort normal and breath sounds normal.  Abdominal: Soft. Bowel sounds are normal. She exhibits no distension. There is no tenderness.  Musculoskeletal: She exhibits no edema or tenderness.  Neurological: She is alert.  Does not follow commands due to advanced dementia.   Skin:  Skin is warm and dry. She is not diaphoretic.  Psychiatric:  Advanced dementia, mumbles      Labs reviewed: Basic Metabolic Panel:  Recent Labs  16/10/96 06/20/15  NA 142 142  K 3.9 4.1  BUN 22* 25*  CREATININE 1.4* 1.7*   Liver Function Tests:  Recent Labs  12/20/14 07/17/15 07/18/15  AST 11* 17 17  ALT ALKPHOS 81 103 103   No results for input(s): LIPASE, AMYLASE in the last 8760 hours. No results for input(s): AMMONIA in the last 8760 hours. CBC:  Recent Labs  12/20/14  WBC 7.4  HGB 11.4*  HCT 33*  PLT 267   TSH: No results for input(s): TSH in the last 8760 hours. A1C: No results found for: HGBA1C Lipid Panel:  Recent Labs  12/20/14 07/17/15 07/18/15  CHOL 114 145 145  HDL 31* 38 38  LDLCALC 66 80 80  TRIG 144 136 136     Assessment/Plan 1. Essential hypertension, benign Blood pressure stable. conts on norvasc, labetalol, and catapres   2. CKD (chronic kidney disease) stage 3, GFR 30-59 ml/min -encourage proper hydration and avoid NSAIDs. Will follow up BMP at this time  3. Vitamin D deficiency -conts on vit D  Supplement, will follow up Vit D level  4. Hyperlipidemia LDL at goal in Dec, conts on lipitor 10 mg daily   5. Anxiety state Remains stable. conts on lorazepam PRN  6. Allergic rhinitis due to pollen Stable, maintained on Claritin 10 mg daily     Opal Mckellips K. Biagio Borg  [facility] [phone] 8 am - 5 pm) [phone] (after hours)

## 2015-10-17 LAB — BASIC METABOLIC PANEL
BUN: 25 mg/dL — AB (ref 4–21)
Creatinine: 9.2 mg/dL — AB (ref 0.5–1.1)
GLUCOSE: 85 mg/dL
Potassium: 3.9 mmol/L (ref 3.4–5.3)
Sodium: 142 mmol/L (ref 137–147)

## 2015-10-17 LAB — CBC AND DIFFERENTIAL
HEMATOCRIT: 35 % — AB (ref 36–46)
HEMOGLOBIN: 11.7 g/dL — AB (ref 12.0–16.0)
PLATELETS: 259 10*3/uL (ref 150–399)
WBC: 8.1 10^3/mL

## 2015-10-17 LAB — LIPID PANEL
Cholesterol: 131 mg/dL (ref 0–200)
HDL: 30 mg/dL — AB (ref 35–70)
LDL CALC: 59 mg/dL
TRIGLYCERIDES: 211 mg/dL — AB (ref 40–160)

## 2015-10-17 LAB — HEPATIC FUNCTION PANEL
ALK PHOS: 86 U/L (ref 25–125)
ALT: 14 U/L (ref 7–35)
AST: 15 U/L (ref 13–35)
BILIRUBIN, TOTAL: 0.3 mg/dL

## 2015-11-15 ENCOUNTER — Encounter: Payer: Self-pay | Admitting: Adult Health

## 2015-11-15 ENCOUNTER — Non-Acute Institutional Stay (SKILLED_NURSING_FACILITY): Payer: Medicare Other | Admitting: Adult Health

## 2015-11-15 DIAGNOSIS — F0391 Unspecified dementia with behavioral disturbance: Secondary | ICD-10-CM

## 2015-11-15 DIAGNOSIS — D638 Anemia in other chronic diseases classified elsewhere: Secondary | ICD-10-CM

## 2015-11-15 DIAGNOSIS — F411 Generalized anxiety disorder: Secondary | ICD-10-CM

## 2015-11-15 DIAGNOSIS — N183 Chronic kidney disease, stage 3 unspecified: Secondary | ICD-10-CM

## 2015-11-15 DIAGNOSIS — J301 Allergic rhinitis due to pollen: Secondary | ICD-10-CM

## 2015-11-15 DIAGNOSIS — E785 Hyperlipidemia, unspecified: Secondary | ICD-10-CM | POA: Diagnosis not present

## 2015-11-15 DIAGNOSIS — I10 Essential (primary) hypertension: Secondary | ICD-10-CM | POA: Diagnosis not present

## 2015-11-15 DIAGNOSIS — F03918 Unspecified dementia, unspecified severity, with other behavioral disturbance: Secondary | ICD-10-CM

## 2015-11-15 LAB — VITAMIN D 1,25 DIHYDROXY: VIT D 25 HYDROXY: 36

## 2015-11-15 NOTE — Progress Notes (Signed)
Patient ID: Amanda [name], female   DOB: [birth year], 70 y.o.   MRN: [id number]   Facility: [facility]       No Known Allergies  Chief Complaint  Patient presents with  . Medical Management of Chronic Issues    Follow up    HPI:  She is a long term resident of this facility being seen for the management of her chronic illnesses.  There is no significant change in her status. She is unable to participate in the hpi or ros. There are no nursing concerns at this time.   Past Medical History  Diagnosis Date  . Hypertension   . Stroke [facility]) ~ 2003    "slight memory loss" (05/18/2012)  . Gout     "?feet" (05/18/2012)  . Dementia     "alzheimer's" (05/18/2012)  . Incontinence of urine   . Wrist fracture, bilateral 05/17/2012    "fell down steps" (05/18/2012)  . Fracture of medial wall of orbit (HCC) 05/17/2012  . Fall 05/17/2012    Past Surgical History  Procedure Laterality Date  . Vaginal hysterectomy     Family History  Problem Relation Age of Onset  . Dementia Mother   . Dementia Father   . Dementia Sister   . Dementia Brother      Social History   Social History  . Marital Status: Married    Spouse Name: N/A  . Number of Children: N/A  . Years of Education: N/A   Occupational History  . Not on file.   Social History Main Topics  . Smoking status: Current Some Day Smoker    Types: Cigarettes  . Smokeless tobacco: Never Used     Comment: 05/18/2012 "used to smoke like a fish; now smokes 1-2 cigarettes/wk; has smoked 30-40 years"  . Alcohol Use: Yes     Comment: 05/18/2012 "probably drank 3 beers/wk til maybe 2 yr ago"  . Drug Use: Yes    Special: Marijuana     Comment: 05/18/2012 "used some marijuana when she was younger"  . Sexual Activity: Not on file   Other Topics Concern  . Not on file   Social History Narrative    VITAL SIGNS BP 117/74 mmHg  Pulse 64  Temp(Src) 97.4 F (36.3 C) (Oral)  Resp 18  Ht  (1.549 m)  Wt 142  lb (64.411 kg)  BMI 26.84 kg/m2  Patient's Medications  New Prescriptions   No medications on file  Previous Medications   ACETAMINOPHEN (TYLENOL) 325 MG TABLET    Take 650 mg by mouth every 6 (six) hours as needed for moderate pain or fever.   AMLODIPINE (NORVASC) 10 MG TABLET    Take 10 mg by mouth daily.   ATORVASTATIN (LIPITOR) 10 MG TABLET    Take 10 mg by mouth daily.   CALCIUM-VITAMIN D PO    Take 500 mg by mouth daily.   CLONIDINE (CATAPRES) 0.1 MG TABLET    Take 0.1 mg by mouth 3 (three) times daily.    DEXTROMETHORPHAN 7.5 MG/5ML SYRP    2 TSP every 6 hours as needed for cough   DOCUSATE SODIUM (COLACE PO)    Take by mouth 2 (two) times daily.   DONEPEZIL (ARICEPT) 10 MG TABLET    Take 10 mg by mouth at bedtime.   HYDROCODONE-ACETAMINOPHEN (NORCO) 5-325 MG PER TABLET    Take 1 by mouth every 6 hours as needed for mild to moderate pain,Take two tablets by mouth  every 6 hours as needed for severe pain   LABETALOL (NORMODYNE) 300 MG TABLET    Take 300 mg by mouth 2 (two) times daily.   LORATADINE (CLARITIN) 10 MG TABLET    Take 10 mg by mouth daily.   LORAZEPAM (LORAZEPAM INTENSOL) 2 MG/ML CONCENTRATED SOLUTION    Take 0.5 mg by mouth every 8 (eight) hours. May use tablet form also   MEMANTINE (NAMENDA XR) 14 MG CP24 24 HR CAPSULE    Take 14 mg by mouth.  Modified Medications   No medications on file  Discontinued Medications   No medications on file     SIGNIFICANT DIAGNOSTIC EXAMS   LABS REVIEWED:   10-17-15: wbc 8.1; hgb 11.7; hct 34.6; mcv 98.2; plt 259; glucose 85; bun 25; creat 1.72; k+ 3.9; na++ 142; liver normal albumin 3.5; vit D 36 chol 131; ldl 59; trig 211; hdl 30    Review of Systems  Unable to perform ROS: dementia    Physical Exam  Constitutional: She appears well-developed and well-nourished. No distress.  Eyes: Conjunctivae are normal.  Neck: Neck supple. No JVD present. No thyromegaly present.  Cardiovascular: Normal rate, regular rhythm and intact  distal pulses.   Respiratory: Effort normal and breath sounds normal. No respiratory distress. She has no wheezes.  GI: Soft. Bowel sounds are normal. She exhibits no distension. There is no tenderness.  Musculoskeletal: She exhibits no edema.  Able to move all extremities   Lymphadenopathy:    She has no cervical adenopathy.  Neurological:  Not aware of surroundings; will mumble   Skin: Skin is warm and dry. She is not diaphoretic.      ASSESSMENT/ PLAN:  1. Hypertension: will continue norvasc 10 mg daily; clonidine 0.1 mg three times daily; labetalol 300 mg twice daily will monitor   2. Dyslipidemia: will continue lipitor 10 mg daily ldl is 59  3. Allergic rhinitis: will continue clairitin 10 mg daily   4. Dementia: no significant change in status; weight is 142 pounds; will continue aricept 10 mg nightly will continue namenda xr 14 mg daily   5. Constipation: will continue colace twice daily   6. CKD: stage III: bun/creat are 25/1.72 will monitor   7. Anemia of chronic disease: hgb is 11.7 will monitor  8. Anxiety: has ativan concentrate 0.5 mg every 8 hours as needed will monitor   Time spent with patient  30  minutes >50% time spent counseling; reviewing medical record; tests; labs; and developing future plan of care   Synthia InnocentDeborah Green NP [facility]  Contact [phone] Monday through Friday 8am- 5pm  After hours call [phone]

## 2015-11-16 LAB — LIPID PANEL
Cholesterol: 120 mg/dL (ref 0–200)
HDL: 37 mg/dL (ref 35–70)
LDL Cholesterol: 64 mg/dL
Triglycerides: 95 mg/dL (ref 40–160)

## 2015-12-13 ENCOUNTER — Encounter: Payer: Self-pay | Admitting: Adult Health

## 2015-12-13 ENCOUNTER — Non-Acute Institutional Stay (SKILLED_NURSING_FACILITY): Payer: Medicare Other | Admitting: Adult Health

## 2015-12-13 DIAGNOSIS — E785 Hyperlipidemia, unspecified: Secondary | ICD-10-CM | POA: Diagnosis not present

## 2015-12-13 DIAGNOSIS — F0391 Unspecified dementia with behavioral disturbance: Secondary | ICD-10-CM | POA: Diagnosis not present

## 2015-12-13 DIAGNOSIS — J301 Allergic rhinitis due to pollen: Secondary | ICD-10-CM | POA: Diagnosis not present

## 2015-12-13 DIAGNOSIS — N183 Chronic kidney disease, stage 3 unspecified: Secondary | ICD-10-CM

## 2015-12-13 DIAGNOSIS — I10 Essential (primary) hypertension: Secondary | ICD-10-CM

## 2015-12-13 DIAGNOSIS — D638 Anemia in other chronic diseases classified elsewhere: Secondary | ICD-10-CM

## 2015-12-13 DIAGNOSIS — F03918 Unspecified dementia, unspecified severity, with other behavioral disturbance: Secondary | ICD-10-CM

## 2015-12-13 NOTE — Progress Notes (Signed)
Patient ID: Amanda [name], female   DOB: May 29, 1946, 70 y.o.   MRN: [id number]   [facility]     CODE STATUS: Full Code  No Known Allergies  Chief Complaint  Patient presents with  . Medical Management of Chronic Issues    Follow up    HPI:  She is a long term resident of this facility being seen for the management of her chronic illnesses. Overall there is little change in her status. She is unable to participate in the hpi or ros. There are no nursing concerns at this time.  Her weight remains stable.   Past Medical History  Diagnosis Date  . Hypertension   . Stroke [facility](HCC) ~ 2003    "slight memory loss" (05/18/2012)  . Gout     "?feet" (05/18/2012)  . Dementia     "alzheimer's" (05/18/2012)  . Incontinence of urine   . Wrist fracture, bilateral 05/17/2012    "fell down steps" (05/18/2012)  . Fracture of medial wall of orbit (HCC) 05/17/2012  . Fall 05/17/2012    Past Surgical History  Procedure Laterality Date  . Vaginal hysterectomy      Social History   Social History  . Marital Status: Married    Spouse Name: N/A  . Number of Children: N/A  . Years of Education: N/A   Occupational History  . Not on file.   Social History Main Topics  . Smoking status: Current Some Day Smoker    Types: Cigarettes  . Smokeless tobacco: Never Used     Comment: 05/18/2012 "used to smoke like a fish; now smokes 1-2 cigarettes/wk; has smoked 30-40 years"  . Alcohol Use: Yes     Comment: 05/18/2012 "probably drank 3 beers/wk til maybe 2 yr ago"  . Drug Use: Yes    Special: Marijuana     Comment: 05/18/2012 "used some marijuana when she was younger"  . Sexual Activity: Not on file   Other Topics Concern  . Not on file   Social History Narrative   Family History  Problem Relation Age of Onset  . Dementia Mother   . Dementia Father   . Dementia Sister   . Dementia Brother       VITAL SIGNS BP 133/90 mmHg  Pulse 84  Temp(Src) 98.5 F (36.9 C) (Oral)   Resp 18  Ht 5\' 1"  (1.549 m)  Wt 143 lb 4 oz (64.978 kg)  BMI 27.08 kg/m2  Patient's Medications  New Prescriptions   No medications on file  Previous Medications   ACETAMINOPHEN (TYLENOL) 325 MG TABLET    Take 650 mg by mouth every 6 (six) hours as needed for moderate pain or fever.   AMLODIPINE (NORVASC) 10 MG TABLET    Take 10 mg by mouth daily.   ATORVASTATIN (LIPITOR) 10 MG TABLET    Take 10 mg by mouth daily.   CALCIUM-VITAMIN D PO    Take 500 mg by mouth daily.   CLONIDINE (CATAPRES) 0.1 MG TABLET    Take 0.1 mg by mouth 3 (three) times daily.    DEXTROMETHORPHAN 7.5 MG/5ML SYRP    2 TSP every 6 hours as needed for cough   DOCUSATE SODIUM (COLACE PO)    Take by mouth 2 (two) times daily.   DONEPEZIL (ARICEPT) 10 MG TABLET    Take 10 mg by mouth at bedtime.   HYDROCODONE-ACETAMINOPHEN (NORCO) 5-325 MG PER TABLET    Take 1 by mouth every 6 hours as needed for mild  to moderate pain,Take two tablets by mouth every 6 hours as needed for severe pain   LABETALOL (NORMODYNE) 300 MG TABLET    Take 300 mg by mouth 2 (two) times daily.   LORATADINE (CLARITIN) 10 MG TABLET    Take 10 mg by mouth daily.   LORAZEPAM (LORAZEPAM INTENSOL) 2 MG/ML CONCENTRATED SOLUTION    Take 0.5 mg by mouth every 8 (eight) hours. May use tablet form also   MEMANTINE (NAMENDA XR) 14 MG CP24 24 HR CAPSULE    Take 14 mg by mouth.  Modified Medications   No medications on file  Discontinued Medications   No medications on file     SIGNIFICANT DIAGNOSTIC EXAMS  LABS REVIEWED:   10-17-15: wbc 8.1; hgb 11.7; hct 34.6; mcv 98.2; plt 259; glucose 85; bun 25; creat 1.72; k+ 3.9; na++ 142; liver normal albumin 3.5; vit D 36 chol 131; ldl 59; trig 211; hdl 30    Review of Systems  Unable to perform ROS: dementia    Physical Exam  Constitutional: She appears well-developed and well-nourished. No distress.  Eyes: Conjunctivae are normal.  Neck: Neck supple. No JVD present. No thyromegaly present.  Cardiovascular:  Normal rate, regular rhythm and intact distal pulses.   Respiratory: Effort normal and breath sounds normal. No respiratory distress. She has no wheezes.  GI: Soft. Bowel sounds are normal. She exhibits no distension. There is no tenderness.  Musculoskeletal: She exhibits no edema.  Does not move her left upper extremity Does move other extremities   Lymphadenopathy:    She has no cervical adenopathy.  Neurological:  Not aware of surroundings; will mumble   Skin: Skin is warm and dry. She is not diaphoretic.      ASSESSMENT/ PLAN:  1. Hypertension: will continue norvasc 10 mg daily; clonidine 0.1 mg three times daily; labetalol 300 mg twice daily will monitor   2. Dyslipidemia: will continue lipitor 10 mg daily ldl is 59  3. Allergic rhinitis: will continue clairitin 10 mg daily   4. Dementia: no significant change in status; weight is 143 pounds; will continue aricept 10 mg nightly will continue namenda xr 14 mg daily   5. Constipation: will continue colace twice daily   6. CKD: stage III: bun/creat are 25/1.72 will monitor   7. Anemia of chronic disease: hgb is 11.7 will monitor  8. Anxiety: has ativan concentrate 0.5 mg every 8 hours as needed will monitor    Synthia Innocent NP [facility]  Contact [phone] Monday through Friday 8am- 5pm  After hours call [phone]

## 2015-12-27 ENCOUNTER — Other Ambulatory Visit: Payer: Self-pay

## 2015-12-27 DIAGNOSIS — Z1231 Encounter for screening mammogram for malignant neoplasm of breast: Secondary | ICD-10-CM

## 2016-01-14 ENCOUNTER — Other Ambulatory Visit: Payer: Self-pay | Admitting: Internal Medicine

## 2016-01-14 ENCOUNTER — Ambulatory Visit
Admission: RE | Admit: 2016-01-14 | Discharge: 2016-01-14 | Disposition: A | Discharge status: Home / Self Care | Payer: Medicare Other | Source: Ambulatory Visit

## 2016-01-14 DIAGNOSIS — Z1231 Encounter for screening mammogram for malignant neoplasm of breast: Secondary | ICD-10-CM

## 2016-01-15 ENCOUNTER — Non-Acute Institutional Stay (SKILLED_NURSING_FACILITY): Payer: Medicare Other | Admitting: Nurse Practitioner

## 2016-01-15 DIAGNOSIS — I10 Essential (primary) hypertension: Secondary | ICD-10-CM

## 2016-01-15 DIAGNOSIS — N183 Chronic kidney disease, stage 3 unspecified: Secondary | ICD-10-CM

## 2016-01-15 DIAGNOSIS — J301 Allergic rhinitis due to pollen: Secondary | ICD-10-CM

## 2016-01-15 DIAGNOSIS — F03918 Unspecified dementia, unspecified severity, with other behavioral disturbance: Secondary | ICD-10-CM

## 2016-01-15 DIAGNOSIS — F411 Generalized anxiety disorder: Secondary | ICD-10-CM

## 2016-01-15 DIAGNOSIS — F0391 Unspecified dementia with behavioral disturbance: Secondary | ICD-10-CM | POA: Diagnosis not present

## 2016-01-15 NOTE — Progress Notes (Signed)
Patient ID: Amanda [name], female   DOB: [birth year], 70 y.o.   MRN: [id number]    Nursing Home Location:  [facility]   Place of Service: SNF (31)  PCP: Kirt Boysarter, Monica, DO  No Known Allergies  Chief Complaint  Patient presents with  . Medical Management of Chronic Issues    HPI:  Patient is a 70 y.o. female seen today at [facility] for routine management of chronic conditions. Pt with a PMH of renal insufficieny, dementia with behaviors, HTN and psychosis. Pt has been stable in the last month without any acute issues per nursing overall pts chronic conditions have been stable.   Review of Systems:  Review of Systems  Unable to perform ROS: Dementia    Past Medical History  Diagnosis Date  . Hypertension   . Stroke [facility](HCC) ~ 2003    "slight memory loss" (05/18/2012)  . Gout     "?feet" (05/18/2012)  . Dementia     "alzheimer's" (05/18/2012)  . Incontinence of urine   . Wrist fracture, bilateral 05/17/2012    "fell down steps" (05/18/2012)  . Fracture of medial wall of orbit (HCC) 05/17/2012  . Fall 05/17/2012   Past Surgical History  Procedure Laterality Date  . Vaginal hysterectomy     Social History:   reports that she has been smoking Cigarettes.  She has never used smokeless tobacco. She reports that she drinks alcohol. She reports that she uses illicit drugs (Marijuana).  Family History  Problem Relation Age of Onset  . Dementia Mother   . Dementia Father   . Dementia Sister   . Dementia Brother     Medications: Patient's Medications  New Prescriptions   No medications on file  Previous Medications   ACETAMINOPHEN (TYLENOL) 325 MG TABLET    Take 650 mg by mouth every 6 (six) hours as needed for moderate pain or fever.   AMLODIPINE (NORVASC) 10 MG TABLET    Take 10 mg by mouth daily.   ATORVASTATIN (LIPITOR) 10 MG TABLET    Take 10 mg by mouth daily.   CALCIUM-VITAMIN D PO    Take 500 mg by mouth daily.   CLONIDINE  (CATAPRES) 0.1 MG TABLET    Take 0.1 mg by mouth 3 (three) times daily.    DOCUSATE SODIUM (COLACE PO)    Take by mouth 2 (two) times daily.   DONEPEZIL (ARICEPT) 10 MG TABLET    Take 10 mg by mouth at bedtime.   HYDROCODONE-ACETAMINOPHEN (NORCO) 5-325 MG PER TABLET    Take 1 by mouth every 6 hours as needed for mild to moderate pain,Take two tablets by mouth every 6 hours as needed for severe pain   LABETALOL (NORMODYNE) 300 MG TABLET    Take 300 mg by mouth 2 (two) times daily.   LORATADINE (CLARITIN) 10 MG TABLET    Take 10 mg by mouth daily.   LORAZEPAM (LORAZEPAM INTENSOL) 2 MG/ML CONCENTRATED SOLUTION    Take 0.5 mg by mouth every 8 (eight) hours. May use tablet form also   MEMANTINE (NAMENDA XR) 14 MG CP24 24 HR CAPSULE    Take 14 mg by mouth.  Modified Medications   No medications on file  Discontinued Medications   DEXTROMETHORPHAN 7.5 MG/5ML SYRP    2 TSP every 6 hours as needed for cough     Physical Exam: Filed Vitals:   01/15/16 1633  BP: 129/65  Pulse: 62  Temp: 97.8 F (36.6 C)  Resp: 18  Weight: 146 lb 9.6 oz (66.497 kg)    Physical Exam  Constitutional: She appears well-developed and well-nourished. No distress.  HENT:  Head: Normocephalic and atraumatic.  Nose: Rhinorrhea present.  Mouth/Throat: Oropharynx is clear and moist.  Eyes: Conjunctivae are normal.  Cardiovascular: Normal rate, regular rhythm and normal heart sounds.   Pulmonary/Chest: Effort normal and breath sounds normal.  Abdominal: Soft. Bowel sounds are normal. She exhibits no distension. There is no tenderness.  Musculoskeletal: She exhibits no edema or tenderness.  Neurological: She is alert.  Skin: Skin is warm and dry. She is not diaphoretic.  Psychiatric:  Advanced dementia, mumbles      Labs reviewed: Basic Metabolic Panel:  Recent Labs  16/10/96 06/20/15 10/17/15  NA 142 142 142  K 3.9 4.1 3.9  BUN 22* 25* 25*  CREATININE 1.4* 1.7* 9.2*   Liver Function Tests:  Recent  Labs  07/17/15 07/18/15 10/17/15  AST ALT ALKPHOS 103 103 86   No results for input(s): LIPASE, AMYLASE in the last 8760 hours. No results for input(s): AMMONIA in the last 8760 hours. CBC:  Recent Labs  10/17/15  WBC 8.1  HGB 11.7*  HCT 35*  PLT 259   TSH: No results for input(s): TSH in the last 8760 hours. A1C: No results found for: HGBA1C Lipid Panel:  Recent Labs  07/18/15 10/17/15 11/16/15  CHOL 145 131 120  HDL 38 30* 37  LDLCALC 80 59 64  TRIG 136 211* 95   10-17-15: wbc 8.1; hgb 11.7; hct 34.6; mcv 98.2; plt 259; glucose 85; bun 25; creat 1.72; k+ 3.9; na++ 142; liver normal albumin 3.5; vit D 36 chol 131; ldl 59; trig 211; hdl 30   Assessment/Plan 1. Dementia with behavioral disturbance -without acute decline. Will cont current medication   2. Anxiety state -remains stable.  3. CKD (chronic kidney disease) stage 3, GFR 30-59 ml/min Remains stable and at baseline.  4. Allergic rhinitis due to pollen Remains stable on claritin 10 mg daily  5. Essential hypertension, benign -blood pressure well controlled on Norvasc, clonidine and labetalol     Macey Wurtz K. Biagio Borg  [facility] [phone] 8 am - 5 pm) [phone] (after hours)

## 2016-02-05 ENCOUNTER — Non-Acute Institutional Stay (SKILLED_NURSING_FACILITY): Payer: Medicare Other | Admitting: Nurse Practitioner

## 2016-02-05 ENCOUNTER — Encounter: Payer: Self-pay | Admitting: Nurse Practitioner

## 2016-02-05 DIAGNOSIS — B354 Tinea corporis: Secondary | ICD-10-CM

## 2016-02-05 NOTE — Progress Notes (Signed)
Patient ID: Amanda [name], female   DOB: [birth year], 70 y.o.   MRN: [id number]    Nursing Home Location: [facility]   Place of Service: SNF (31)  PCP: Kirt Boysarter, Monica, DO  No Known Allergies  Chief Complaint  Patient presents with  . Acute Visit    Sores on left hip    HPI:  Patient is a 70 y.o. female [facility] today [facility] at the request of nursing due to places on pts left hip Pt with a PMH of renal insufficieny, dementia with behaviors, HTN and psychosis. Nursing aid noticed small tiny areas on left hip last month that have not gotten better but are bigger in size. Pt with dementia so she is unable to contribute to ROS or HPI.   Review of Systems:  Review of Systems  Unable to perform ROS: Dementia    Past Medical History  Diagnosis Date  . Hypertension   . Stroke [facility](HCC) ~ 2003    "slight memory loss" (05/18/2012)  . Gout     "?feet" (05/18/2012)  . Dementia     "alzheimer's" (05/18/2012)  . Incontinence of urine   . Wrist fracture, bilateral 05/17/2012    "fell down steps" (05/18/2012)  . Fracture of medial wall of orbit (HCC) 05/17/2012  . Fall 05/17/2012   Past Surgical History  Procedure Laterality Date  . Vaginal hysterectomy     Social History:   reports that she has quit smoking. Her smoking use included Cigarettes. She has never used smokeless tobacco. She reports that she does not drink alcohol or use illicit drugs.  Family History  Problem Relation Age of Onset  . Dementia Mother   . Dementia Father   . Dementia Sister   . Dementia Brother     Medications: Patient's Medications  New Prescriptions   No medications on file  Previous Medications   ACETAMINOPHEN (TYLENOL) 325 MG TABLET    Take 650 mg by mouth every 6 (six) hours as needed for moderate pain or fever.   AMLODIPINE (NORVASC) 10 MG TABLET    Take 10 mg by mouth daily.   ATORVASTATIN (LIPITOR) 10 MG TABLET    Take 10 mg by mouth daily.   CALCIUM-VITAMIN D PO    Take  500 mg by mouth daily.   CLONIDINE (CATAPRES) 0.1 MG TABLET    Take 0.1 mg by mouth 3 (three) times daily.    DOCUSATE SODIUM (COLACE PO)    Take by mouth 2 (two) times daily.   DONEPEZIL (ARICEPT) 10 MG TABLET    Take 10 mg by mouth at bedtime.   HYDROCODONE-ACETAMINOPHEN (NORCO) 5-325 MG PER TABLET    Take 1 by mouth every 6 hours as needed for mild to moderate pain,Take two tablets by mouth every 6 hours as needed for severe pain   LABETALOL (NORMODYNE) 300 MG TABLET    Take 300 mg by mouth 2 (two) times daily.   LORATADINE (CLARITIN) 10 MG TABLET    Take 10 mg by mouth daily.   LORAZEPAM (LORAZEPAM INTENSOL) 2 MG/ML CONCENTRATED SOLUTION    Take 0.5 mg by mouth every 8 (eight) hours. May use tablet form also   MEMANTINE (NAMENDA XR) 14 MG CP24 24 HR CAPSULE    Take 14 mg by mouth.  Modified Medications   No medications on file  Discontinued Medications   No medications on file     Physical Exam: Filed Vitals:   02/05/16 1129  BP: 116/67  Pulse: 60  Temp: 97.8 F (36.6 C)  TempSrc: Oral  Resp: 18  Height: 5\' 1"  (1.549 m)  Weight: 146 lb 9.6 oz (66.497 kg)    Physical Exam  Constitutional: She appears well-developed and well-nourished. No distress.  HENT:  Head: Normocephalic and atraumatic.  Nose: Rhinorrhea present.  Musculoskeletal: She exhibits no edema or tenderness.  Neurological: She is alert.  Skin: Skin is warm and dry. She is not diaphoretic.  2 large circular lesions to pts lateral thigh, red raised margins with central clearing noted  Psychiatric:  Advanced dementia, mumbles      Labs reviewed: Basic Metabolic Panel:  Recent Labs  16/05/9606/18/16 06/20/15 10/17/15  NA 142 142 142  K 3.9 4.1 3.9  BUN 22* 25* 25*  CREATININE 1.4* 1.7* 9.2*   Liver Function Tests:  Recent Labs  07/17/15 07/18/15 10/17/15  AST 17 17 15   ALT 13 13 14   ALKPHOS 103 103 86   No results for input(s): LIPASE, AMYLASE in the last 8760 hours. No results for input(s): AMMONIA in  the last 8760 hours. CBC:  Recent Labs  10/17/15  WBC 8.1  HGB 11.7*  HCT 35*  PLT 259   TSH: No results for input(s): TSH in the last 8760 hours. A1C: No results found for: HGBA1C Lipid Panel:  Recent Labs  07/18/15 10/17/15 11/16/15  CHOL 145 131 120  HDL 38 30* 37  LDLCALC 80 59 64  TRIG 136 211* 95    Assessment/Plan 1. Tinea corporis -clotrimazole 1% cream to affected area BID for 2 weeks    Shyteria Lewis K. Biagio BorgEubanks, AGNP  [facility] [phone](Monday-Friday 8 am - 5 pm) [phone] (after hours)

## 2016-02-14 ENCOUNTER — Non-Acute Institutional Stay (SKILLED_NURSING_FACILITY): Payer: Medicare Other | Admitting: Nurse Practitioner

## 2016-02-14 DIAGNOSIS — I10 Essential (primary) hypertension: Secondary | ICD-10-CM

## 2016-02-14 DIAGNOSIS — F0391 Unspecified dementia with behavioral disturbance: Secondary | ICD-10-CM

## 2016-02-14 DIAGNOSIS — N183 Chronic kidney disease, stage 3 unspecified: Secondary | ICD-10-CM

## 2016-02-14 DIAGNOSIS — F411 Generalized anxiety disorder: Secondary | ICD-10-CM

## 2016-02-14 DIAGNOSIS — F03918 Unspecified dementia, unspecified severity, with other behavioral disturbance: Secondary | ICD-10-CM

## 2016-02-14 DIAGNOSIS — J301 Allergic rhinitis due to pollen: Secondary | ICD-10-CM | POA: Diagnosis not present

## 2016-02-14 NOTE — Progress Notes (Signed)
Patient ID: Amanda [name], female   DOB: 1946/05/07, 70 y.o.   MRN: [id number]    [facility]urs[facility]   Place of Service: SNF (31)  PCP: Kirt Boysarter, Monica, DO  No Known Allergies  Chief Complaint  Patient presents with  . Medical Management of Chronic Issues    Routine Visit    HPI:  Patient is a 70 y.o. female [facility] for routine follow up on chronic conditions.  Pt with a PMH of renal insufficieny, dementia with behaviors, HTN and psychosis. Pt was seen of tinea corporis in the last month, staff conts to use cream. Rash has remained unchanged at this time. Pt without any significant change of chronic conditions. Pt with dementia and can not contribute to ROS or HPI   Review of Systems:  Review of Systems  Unable to perform ROS: Dementia    Past Medical History  Diagnosis Date  . Hypertension   . Stroke [facility](HCC) ~ 2003    "slight memory loss" (05/18/2012)  . Gout     "?feet" (05/18/2012)  . Dementia     "alzheimer's" (05/18/2012)  . Incontinence of urine   . Wrist fracture, bilateral 05/17/2012    "fell down steps" (05/18/2012)  . Fracture of medial wall of orbit (HCC) 05/17/2012  . Fall 05/17/2012   Past Surgical History  Procedure Laterality Date  . Vaginal hysterectomy     Social History:   reports that she has quit smoking. Her smoking use included Cigarettes. She has never used smokeless tobacco. She reports that she does not drink alcohol or use illicit drugs.  Family History  Problem Relation Age of Onset  . Dementia Mother   . Dementia Father   . Dementia Sister   . Dementia Brother     Medications: Patient's Medications  New Prescriptions   No medications on file  Previous Medications   ACETAMINOPHEN (TYLENOL) 325 MG TABLET    Take 650 mg by mouth every 6 (six) hours as needed for moderate pain or fever.   AMLODIPINE (NORVASC) 10 MG TABLET    Take 10 mg by mouth daily.   ATORVASTATIN (LIPITOR) 10 MG TABLET     Take 10 mg by mouth daily.   CALCIUM-VITAMIN D PO    Take 500 mg by mouth daily.   CLONIDINE (CATAPRES) 0.1 MG TABLET    Take 0.1 mg by mouth 3 (three) times daily.    DOCUSATE SODIUM (COLACE PO)    Take by mouth 2 (two) times daily.   DONEPEZIL (ARICEPT) 10 MG TABLET    Take 10 mg by mouth at bedtime.   HYDROCODONE-ACETAMINOPHEN (NORCO) 5-325 MG PER TABLET    Take 1 by mouth every 6 hours as needed for mild to moderate pain,Take two tablets by mouth every 6 hours as needed for severe pain   LABETALOL (NORMODYNE) 300 MG TABLET    Take 300 mg by mouth 2 (two) times daily.   LORATADINE (CLARITIN) 10 MG TABLET    Take 10 mg by mouth daily.   LORAZEPAM (LORAZEPAM INTENSOL) 2 MG/ML CONCENTRATED SOLUTION    Take 0.5 mg by mouth every 8 (eight) hours. May use tablet form also   MEMANTINE (NAMENDA XR) 14 MG CP24 24 HR CAPSULE    Take 14 mg by mouth.  Modified Medications   No medications on file  Discontinued Medications   No medications on file     Physical Exam: Filed Vitals:   02/14/16 1458  BP: 107/67  Pulse: 74  Temp: 97.8 F (36.6 C)  TempSrc: Oral  Resp: 17  Height: 5\' 1"  (1.549 m)  Weight: 143 lb 9.6 oz (65.137 kg)    Physical Exam  Constitutional: She appears well-developed and well-nourished. No distress.  HENT:  Head: Normocephalic and atraumatic.  Nose: Rhinorrhea present.  Musculoskeletal: She exhibits no edema or tenderness.  Neurological: She is alert.  Skin: Skin is warm and dry. She is not diaphoretic.  2 large circular lesions to pts lateral thigh, red raised margins with central clearing noted  Psychiatric:  Advanced dementia, mumbles      Labs reviewed: Basic Metabolic Panel:  Recent Labs  40/98/1107/18/16 06/20/15 10/17/15  NA 142 142 142  K 3.9 4.1 3.9  BUN 22* 25* 25*  CREATININE 1.4* 1.7* 1.72   Liver Function Tests:  Recent Labs  07/17/15 07/18/15 10/17/15  AST 17 17 15   ALT 13 13 14   ALKPHOS 103 103 86   No results for input(s): LIPASE,  AMYLASE in the last 8760 hours. No results for input(s): AMMONIA in the last 8760 hours. CBC:  Recent Labs  10/17/15  WBC 8.1  HGB 11.7*  HCT 35*  PLT 259   TSH: No results for input(s): TSH in the last 8760 hours. A1C: No results found for: HGBA1C Lipid Panel:  Recent Labs  07/18/15 10/17/15 11/16/15  CHOL 145 131 120  HDL 38 30* 37  LDLCALC 80 59 64  TRIG 136 211* 95    Assessment/Plan 1. Essential hypertension, benign -blood pressure stable, cont current regimen  2. Allergic rhinitis due to pollen -stable, cont on Claritin   3. Dementia with behavioral disturbance Stable, conts on aricept and namenda   4. CKD (chronic kidney disease) stage 3, GFR 30-59 ml/min Renal disease unchanged from labs from march, cont to encourage hydration and avoid nephrotoxic drugs  5. Anxiety state Remains stable    Bernetta Sutley K. Biagio BorgEubanks, AGNP  [facility] [phone](Monday-Friday 8 am - 5 pm) [phone] (after hours)

## 2016-02-21 ENCOUNTER — Encounter: Payer: Self-pay | Admitting: Gastroenterology

## 2016-03-11 ENCOUNTER — Encounter: Payer: Self-pay | Admitting: Nurse Practitioner

## 2016-03-11 ENCOUNTER — Non-Acute Institutional Stay (SKILLED_NURSING_FACILITY): Payer: Medicare Other | Admitting: Nurse Practitioner

## 2016-03-11 DIAGNOSIS — B354 Tinea corporis: Secondary | ICD-10-CM

## 2016-03-11 DIAGNOSIS — I10 Essential (primary) hypertension: Secondary | ICD-10-CM | POA: Diagnosis not present

## 2016-03-11 DIAGNOSIS — F03918 Unspecified dementia, unspecified severity, with other behavioral disturbance: Secondary | ICD-10-CM

## 2016-03-11 DIAGNOSIS — K5900 Constipation, unspecified: Secondary | ICD-10-CM | POA: Diagnosis not present

## 2016-03-11 DIAGNOSIS — F0391 Unspecified dementia with behavioral disturbance: Secondary | ICD-10-CM

## 2016-03-11 NOTE — Progress Notes (Signed)
Patient ID: Amanda [name], female   DOB: [birth year], 70 y.o.   MRN: [id number]    Nursing Home Location: [facility]   Place of Service: SNF (31)  PCP: Kirt Boys, DO  No Known Allergies  Chief Complaint  Patient presents with  . Medical Management of Chronic Issues    Routine Visit    HPI:  Patient is a 70 y.o. female seen today at [facility] for routine follow up on chronic conditions.  Pt with a PMH of renal insufficieny, dementia with behaviors, HTN and psychosis. Husband reports he is not sure cream for tinea corporis is actually being applied BID. He ensures it is being done in the evening. Not much improvement.  Blood pressure ranging from 116-157/76-88. No increase in anxiety or behaviors.  Husband reports increase in constipation so using colace at this time as well as toilet schedule.  Review of Systems:  Review of Systems  Unable to perform ROS: Dementia    Past Medical History:  Diagnosis Date  . Dementia    "alzheimer's" (05/18/2012)  . Fall 05/17/2012  . Fracture of medial wall of orbit (HCC) 05/17/2012  . Gout    "?feet" (05/18/2012)  . Hypertension   . Incontinence of urine   . Stroke [facility]) ~ 2003   "slight memory loss" (05/18/2012)  . Wrist fracture, bilateral 05/17/2012   "fell down steps" (05/18/2012)   Past Surgical History:  Procedure Laterality Date  . VAGINAL HYSTERECTOMY     Social History:   reports that she has quit smoking. Her smoking use included Cigarettes. She has never used smokeless tobacco. She reports that she does not drink alcohol or use drugs.  Family History  Problem Relation Age of Onset  . Dementia Mother   . Dementia Father   . Dementia Sister   . Dementia Brother     Medications: Patient's Medications  New Prescriptions   No medications on file  Previous Medications   ACETAMINOPHEN (TYLENOL) 325 MG TABLET    Take 650 mg by mouth every 6 (six) hours as needed for moderate pain or fever.   AMLODIPINE (NORVASC) 10 MG TABLET    Take 10 mg by mouth daily.   ATORVASTATIN (LIPITOR) 10 MG TABLET    Take 10 mg by mouth daily.   CALCIUM-VITAMIN D PO    Take 500 mg by mouth daily.   CLONIDINE (CATAPRES) 0.1 MG TABLET    Take 0.1 mg by mouth 3 (three) times daily.    CLOTRIMAZOLE (LOTRIMIN) 1 % CREAM    Apply to left lateral hip three times daily for until resolved.   DOCUSATE SODIUM (COLACE PO)    Take by mouth 2 (two) times daily.   DONEPEZIL (ARICEPT) 10 MG TABLET    Take 10 mg by mouth at bedtime.   HYDROCODONE-ACETAMINOPHEN (NORCO) 5-325 MG PER TABLET    Take 1 by mouth every 6 hours as needed for mild to moderate pain,Take two tablets by mouth every 6 hours as needed for severe pain   LABETALOL (NORMODYNE) 300 MG TABLET    Take 300 mg by mouth 2 (two) times daily.   LORATADINE (CLARITIN) 10 MG TABLET    Take 10 mg by mouth daily.   LORAZEPAM (LORAZEPAM INTENSOL) 2 MG/ML CONCENTRATED SOLUTION    Take 0.5 mg by mouth every 8 (eight) hours. May use tablet form also   MEMANTINE (NAMENDA XR) 14 MG CP24 24 HR CAPSULE    Take 14 mg by mouth.  Modified Medications   No medications on file  Discontinued Medications   No medications on file     Physical Exam: Vitals:   03/11/16 1333  BP: (!) 157/88  Pulse: 64  Resp: 18  Temp: 98.9 F (37.2 C)  TempSrc: Oral  Weight: 143 lb 3.2 oz (65 kg)  Height:  (1.549 m)    Physical Exam  Constitutional: She appears well-developed and well-nourished. No distress.  HENT:  Head: Normocephalic and atraumatic.  Nose: Rhinorrhea present.  Musculoskeletal: She exhibits no edema or tenderness.  Neurological: She is alert.  Skin: Skin is warm and dry. She is not diaphoretic.  Psychiatric:  Advanced dementia, mumbles      Labs reviewed: Basic Metabolic Panel:  Recent Labs  91/47/82 06/20/15 10/17/15  NA 142 142 142  K 3.9 4.1 3.9  BUN 22* 25* 25*  CREATININE 1.4* 1.7* 1.72   Liver Function Tests:  Recent Labs  07/17/15  07/18/15 10/17/15  AST ALT ALKPHOS 103 103 86   No results for input(s): LIPASE, AMYLASE in the last 8760 hours. No results for input(s): AMMONIA in the last 8760 hours. CBC:  Recent Labs  10/17/15  WBC 8.1  HGB 11.7*  HCT 35*  PLT 259   TSH: No results for input(s): TSH in the last 8760 hours. A1C: No results found for: HGBA1C Lipid Panel:  Recent Labs  07/18/15 10/17/15 11/16/15  CHOL 145 131 120  HDL 38 30* 37  LDLCALC 80 59 64  TRIG 136 211* 95    Assessment/Plan 1. Constipation, unspecified constipation type To use colace 100 mg BID and scheduled trips to the toilet, encourage proper hydration    2. Dementia with behavioral disturbance Stable, gradual worsening of disease but no acute changes in cognitive or functional status at this tme  3. Essential hypertension, benign -blood pressure variable, will cont current regimen and monitor. Most blood pressure ~120-130/80s  4. Tinea corporis -will increase clotrimazole cream to TID at this time   [name]. Biagio Borg  [facility] [phone] 8 am - 5 pm) [phone] (after hours)

## 2016-03-30 ENCOUNTER — Encounter: Payer: Medicare Other | Admitting: Gastroenterology

## 2016-04-01 ENCOUNTER — Ambulatory Visit (INDEPENDENT_AMBULATORY_CARE_PROVIDER_SITE_OTHER): Payer: Medicare Other | Admitting: Gastroenterology

## 2016-04-01 ENCOUNTER — Encounter: Payer: Self-pay | Admitting: Gastroenterology

## 2016-04-01 VITALS — HR 68

## 2016-04-01 DIAGNOSIS — Z1211 Encounter for screening for malignant neoplasm of colon: Secondary | ICD-10-CM | POA: Diagnosis not present

## 2016-04-01 NOTE — Progress Notes (Signed)
Amanda [name]    [id number]    1946/02/17  Primary Care Physician:Carter, Maxine GlennMonica, DO  Referring Physician: Kirt BoysMonica Carter, DO [address] [place] [id number]  Chief complaint:  To discuss screening colonoscopy HPI: [age] yr F with advanced dementia, psychosis is here accompanied by her husband to discuss screening for colorectal cancer. Patient cannot communicate, is not in a state to even answers simple questions. According to her husband they were asked to schedule an appointment for screening colonoscopy by their insurance company. Denies having any specific GI problem. No nausea, vomiting, abdominal pain, melena or bright red blood per rectum. She has intermittent constipation that's currently managed with Colace    Outpatient Encounter Prescriptions as of 04/01/2016  Medication Sig  . acetaminophen (TYLENOL) 325 MG tablet Take 650 mg by mouth every 6 (six) hours as needed for moderate pain or fever.  Marland Kitchen. amLODipine (NORVASC) 10 MG tablet Take 10 mg by mouth daily.  Marland Kitchen. atorvastatin (LIPITOR) 10 MG tablet Take 10 mg by mouth daily.  Marland Kitchen. CALCIUM-VITAMIN D PO Take 500 mg by mouth daily.  . cloNIDine (CATAPRES) 0.1 MG tablet Take 0.1 mg by mouth 3 (three) times daily.   . clotrimazole (LOTRIMIN) 1 % cream Apply 1 application topically 3 (three) times daily. TO LEFT HIP  . Docusate Sodium (COLACE PO) Take by mouth 2 (two) times daily.  Marland Kitchen. donepezil (ARICEPT) 10 MG tablet Take 10 mg by mouth at bedtime.  Marland Kitchen. HYDROcodone-acetaminophen (NORCO) 5-325 MG per tablet Take 1 by mouth every 6 hours as needed for mild to moderate pain,Take two tablets by mouth every 6 hours as needed for severe pain  . labetalol (NORMODYNE) 300 MG tablet Take 300 mg by mouth 2 (two) times daily.  Marland Kitchen. loratadine (CLARITIN) 10 MG tablet Take 10 mg by mouth daily.  . memantine (NAMENDA XR) 14 MG CP24 24 hr capsule Take 14 mg by mouth.  . [DISCONTINUED] LORazepam (LORAZEPAM INTENSOL) 2 MG/ML  concentrated solution Take 0.5 mg by mouth every 8 (eight) hours. May use tablet form also   No facility-administered encounter medications on file as of 04/01/2016.     Allergies as of 04/01/2016  . (No Known Allergies)    Past Medical History:  Diagnosis Date  . Dementia    "alzheimer's" (05/18/2012)  . Fall 05/17/2012  . Fracture of medial wall of orbit (HCC) 05/17/2012  . Gout    "?feet" (05/18/2012)  . Hypertension   . Incontinence of urine   . Stroke [facility](HCC) ~ 2003   "slight memory loss" (05/18/2012)  . Wrist fracture, bilateral 05/17/2012   "fell down steps" (05/18/2012)    Past Surgical History:  Procedure Laterality Date  . VAGINAL HYSTERECTOMY      Family History  Problem Relation Age of Onset  . Dementia Mother   . Dementia Father   . Dementia Sister   . Dementia Brother     Social History   Social History  . Marital status: Married    Spouse name: N/A  . Number of children: N/A  . Years of education: N/A   Occupational History  . Not on file.   Social History Main Topics  . Smoking status: Former Smoker    Types: Cigarettes  . Smokeless tobacco: Never Used     Comment: [facility] that [facility] no longer smokes.   . Alcohol use No     Comment: [facility] reports  that patient no longer drinks.   . Drug use: No  . Sexual activity: Not on file   Other Topics Concern  . Not on file   Social History Narrative  . No narrative on file      Review of systems:  Unable to do review of systems due to advanced dementia   Physical Exam: Vitals:   04/01/16 1051  Pulse: 68   There is no height or weight on file to calculate BMI. Gen:      No acute distress, in wheelchair, patient is agitated and screaming randomly HEENT:  EOMI, sclera anicteric Lungs:    Clear to auscultation bilaterally; normal respiratory effort CV:         Regular rate and rhythm; no murmurs Neuro: alert and not oriented to person ,time or place oriented     Data Reviewed:  Reviewed chart in epic   Assessment and Plan/Recommendations:  70 year old female with hypertension, stroke, advanced dementia with behavioral changes is here accompanied by her husband to discuss screening for colorectal cancer Given her advanced dementia will not recommend screening colonoscopy for colorectal cancer as the potential risks outweigh the benefit from the preventative screening procedure Patient is currently asymptomatic with no specific GI complaint Intermittent constipation: Continue Colace Return as needed Greater than 50% of the time used for counseling as well as treatment plan and follow-up.   Iona BeardK. Veena Nandigam , MD [phone] Mon-Fri 8a-5p [phone] after 5p, weekends, holidays  CC: Kirt Boysarter, Monica, DO

## 2016-04-01 NOTE — Patient Instructions (Signed)
Follow up as needed

## 2016-04-10 ENCOUNTER — Encounter: Payer: Self-pay | Admitting: Nurse Practitioner

## 2016-04-10 ENCOUNTER — Non-Acute Institutional Stay (SKILLED_NURSING_FACILITY): Payer: Medicare Other | Admitting: Nurse Practitioner

## 2016-04-10 DIAGNOSIS — J301 Allergic rhinitis due to pollen: Secondary | ICD-10-CM | POA: Diagnosis not present

## 2016-04-10 DIAGNOSIS — B354 Tinea corporis: Secondary | ICD-10-CM | POA: Diagnosis not present

## 2016-04-10 DIAGNOSIS — D638 Anemia in other chronic diseases classified elsewhere: Secondary | ICD-10-CM | POA: Diagnosis not present

## 2016-04-10 DIAGNOSIS — F0391 Unspecified dementia with behavioral disturbance: Secondary | ICD-10-CM | POA: Diagnosis not present

## 2016-04-10 DIAGNOSIS — N183 Chronic kidney disease, stage 3 unspecified: Secondary | ICD-10-CM

## 2016-04-10 DIAGNOSIS — F03918 Unspecified dementia, unspecified severity, with other behavioral disturbance: Secondary | ICD-10-CM

## 2016-04-10 DIAGNOSIS — I10 Essential (primary) hypertension: Secondary | ICD-10-CM | POA: Diagnosis not present

## 2016-04-10 NOTE — Progress Notes (Signed)
Patient ID: Amanda [name], female   DOB: [birth year], 70 y.o.   MRN: [id number]    Nursing Home Location: [facility]   Place of Service: SNF (31)  PCP: Marga Melnick, MD  No Known Allergies  Chief Complaint  Patient presents with  . Medical Management of Chronic Issues    Routine Visit    HPI:  Patient is a 70 y.o. female seen today at [facility] for routine follow up on chronic conditions.  Pt with a PMH of renal insufficieny, dementia with behaviors, HTN and psychosis. Pt has been doing well in the last month. There is no acute changes or issues noted. Tinea corporis has resolved. No complaints of constipation.  Pt has had not any increase in behaviors or worsening anxiety noted.    Review of Systems:  Review of Systems  Unable to perform ROS: Dementia    Past Medical History:  Diagnosis Date  . Dementia    "alzheimer's" (05/18/2012)  . Fall 05/17/2012  . Fracture of medial wall of orbit (HCC) 05/17/2012  . Gout    "?feet" (05/18/2012)  . Hypertension   . Incontinence of urine   . Stroke [facility]) ~ 2003   "slight memory loss" (05/18/2012)  . Wrist fracture, bilateral 05/17/2012   "fell down steps" (05/18/2012)   Past Surgical History:  Procedure Laterality Date  . VAGINAL HYSTERECTOMY     Social History:   reports that she has quit smoking. Her smoking use included Cigarettes. She has never used smokeless tobacco. She reports that she does not drink alcohol or use drugs.  Family History  Problem Relation Age of Onset  . Dementia Mother   . Dementia Father   . Dementia Sister   . Dementia Brother     Medications: Patient's Medications  New Prescriptions   No medications on file  Previous Medications   ACETAMINOPHEN (TYLENOL) 325 MG TABLET    Take 650 mg by mouth every 6 (six) hours as needed for moderate pain or fever.   AMLODIPINE (NORVASC) 10 MG TABLET    Take 10 mg by mouth daily.   ATORVASTATIN (LIPITOR) 10 MG TABLET    Take 10 mg by  mouth daily.   CALCIUM-VITAMIN D PO    Take 500 mg by mouth daily.   CLONIDINE (CATAPRES) 0.1 MG TABLET    Take 0.1 mg by mouth 3 (three) times daily.    DOCUSATE SODIUM (COLACE PO)    Take by mouth 2 (two) times daily.   DONEPEZIL (ARICEPT) 10 MG TABLET    Take 10 mg by mouth at bedtime.   HYDROCODONE-ACETAMINOPHEN (NORCO) 5-325 MG PER TABLET    Take 1 by mouth every 6 hours as needed for mild to moderate pain,Take two tablets by mouth every 6 hours as needed for severe pain   LABETALOL (NORMODYNE) 300 MG TABLET    Take 300 mg by mouth 2 (two) times daily.   LORATADINE (CLARITIN) 10 MG TABLET    Take 10 mg by mouth daily.   LORAZEPAM (ATIVAN) 2 MG TABLET    Take 2 mg by mouth every 8 (eight) hours as needed for anxiety.   MEMANTINE (NAMENDA XR) 14 MG CP24 24 HR CAPSULE    Take 14 mg by mouth.  Modified Medications   No medications on file  Discontinued Medications   CLOTRIMAZOLE (LOTRIMIN) 1 % CREAM    Apply 1 application topically 3 (three) times daily. TO LEFT HIP     Physical Exam: Vitals:  04/10/16 1112  BP: 138/84  Pulse: 68  Resp: 18  Temp: 98.9 F (37.2 C)  Weight: 148 lb (67.1 kg)  Height: 5\' 1"  (1.549 m)    Physical Exam  Constitutional: She appears well-developed and well-nourished. No distress.  HENT:  Head: Normocephalic and atraumatic.  Nose: Rhinorrhea present.  Musculoskeletal: She exhibits no edema or tenderness.  Neurological: She is alert.  Skin: Skin is warm and dry. She is not diaphoretic.  Psychiatric:  Advanced dementia, mumbles      Labs reviewed: Labs reviewed: Basic Metabolic Panel:  Recent Labs  16/05/9610/10/16 10/17/15  NA 142 142  K 4.1 3.9  BUN 25* 25*  CREATININE 1.7* 9.2*  * Cr of 9.2 entered in error* should be Cr of 1.72 on 10/17/15 Liver Function Tests:  Recent Labs  07/17/15 07/18/15 10/17/15  AST 17 17 15   ALT 13 13 14   ALKPHOS 103 103 86   No results for input(s): LIPASE, AMYLASE in the last 8760 hours. No results for  input(s): AMMONIA in the last 8760 hours. CBC:  Recent Labs  10/17/15  WBC 8.1  HGB 11.7*  HCT 35*  PLT 259   CBG: No results for input(s): GLUCAP in the last 8760 hours. TSH: No results for input(s): TSH in the last 8760 hours. A1C: No results found for: HGBA1C Lipid Panel:  Recent Labs  07/18/15 10/17/15 11/16/15  CHOL 145 131 120  HDL 38 30* 37  LDLCALC 80 59 64  TRIG 136 211* 95    Assessment/Plan 1. Essential hypertension, benign Stable at this time. Conts on norvasc, labetalol and catapres   2. CKD (chronic kidney disease) stage 3, GFR 30-59 ml/min To avoid nephrotoxic medication and dehydration. Will follow up labs at this time   3. Anemia of chronic disease Will follow up CBC  4. Tinea corporis Has resolved   5. Seasonal allergic rhinitis due to pollen Allergies have been stable, will change claritin to as needed at this time  6. Dementia Remains stable, without significant decline in cognitive or functional status. conts on aricept and [employer]    Tikesha Mort K. Biagio BorgEubanks, AGNP  [facility] [phone](Monday-Friday 8 am - 5 pm) [phone] (after hours)

## 2016-04-12 LAB — CBC AND DIFFERENTIAL
HEMATOCRIT: 36 % (ref 36–46)
HEMOGLOBIN: 11.8 g/dL — AB (ref 12.0–16.0)
Platelets: 267 10*3/uL (ref 150–399)
WBC: 8.4 10*3/mL

## 2016-04-12 LAB — HEPATIC FUNCTION PANEL
ALT: 18 U/L (ref 7–35)
AST: 17 U/L (ref 13–35)
Alkaline Phosphatase: 82 U/L (ref 25–125)
BILIRUBIN, TOTAL: 0.2 mg/dL

## 2016-04-12 LAB — BASIC METABOLIC PANEL
BUN: 35 mg/dL — AB (ref 4–21)
Creatinine: 2.1 mg/dL — AB (ref 0.5–1.1)
GLUCOSE: 119 mg/dL
Potassium: 3.8 mmol/L (ref 3.4–5.3)
Sodium: 144 mmol/L (ref 137–147)

## 2016-05-13 ENCOUNTER — Non-Acute Institutional Stay (SKILLED_NURSING_FACILITY): Payer: Medicare Other | Admitting: Nurse Practitioner

## 2016-05-13 ENCOUNTER — Encounter: Payer: Self-pay | Admitting: Nurse Practitioner

## 2016-05-13 DIAGNOSIS — J301 Allergic rhinitis due to pollen: Secondary | ICD-10-CM | POA: Diagnosis not present

## 2016-05-13 DIAGNOSIS — D638 Anemia in other chronic diseases classified elsewhere: Secondary | ICD-10-CM

## 2016-05-13 DIAGNOSIS — I10 Essential (primary) hypertension: Secondary | ICD-10-CM

## 2016-05-13 DIAGNOSIS — F0281 Dementia in other diseases classified elsewhere with behavioral disturbance: Secondary | ICD-10-CM | POA: Diagnosis not present

## 2016-05-13 DIAGNOSIS — G300 Alzheimer's disease with early onset: Secondary | ICD-10-CM | POA: Diagnosis not present

## 2016-05-13 DIAGNOSIS — N183 Chronic kidney disease, stage 3 unspecified: Secondary | ICD-10-CM

## 2016-05-13 DIAGNOSIS — F02818 Dementia in other diseases classified elsewhere, unspecified severity, with other behavioral disturbance: Secondary | ICD-10-CM

## 2016-05-13 NOTE — Progress Notes (Signed)
Patient ID: Amanda [name], female   DOB: [birth year], 70 y.o.   MRN: [id number]    Nursing Home Location: [facility]   Place of Service: SNF (31)  PCP: Marga Melnick, MD  No Known Allergies  Chief Complaint  Patient presents with  . Medical Management of Chronic Issues    Routine Visit    HPI:  Patient is a 70 y.o. female seen today at [facility] for routine follow up on chronic conditions.  Pt with a PMH of renal insufficieny, dementia with behaviors, HTN and psychosis. Pt has been doing well in the last month. Pt has had slow decline with progression of dementia but no acute changes in the last month. Pt eating well with assistance from staff and her husband. No signs of pain.   Review of Systems:  Review of Systems  Unable to perform ROS: Dementia    Past Medical History:  Diagnosis Date  . Dementia    "alzheimer's" (05/18/2012)  . Fall 05/17/2012  . Fracture of medial wall of orbit (HCC) 05/17/2012  . Gout    "?feet" (05/18/2012)  . Hypertension   . Incontinence of urine   . Stroke [facility]) ~ 2003   "slight memory loss" (05/18/2012)  . Wrist fracture, bilateral 05/17/2012   "fell down steps" (05/18/2012)   Past Surgical History:  Procedure Laterality Date  . VAGINAL HYSTERECTOMY     Social History:   reports that she has quit smoking. Her smoking use included Cigarettes. She has never used smokeless tobacco. She reports that she does not drink alcohol or use drugs.  Family History  Problem Relation Age of Onset  . Dementia Mother   . Dementia Father   . Dementia Sister   . Dementia Brother     Medications: Patient's Medications  New Prescriptions   No medications on file  Previous Medications   ACETAMINOPHEN (TYLENOL) 325 MG TABLET    Take 650 mg by mouth every 6 (six) hours as needed for moderate pain or fever.   AMBULATORY NON FORMULARY MEDICATION    Give 1 Magic cup by mouth twice daily for supplement.   AMLODIPINE (NORVASC) 10 MG  TABLET    Take 10 mg by mouth daily.   ATORVASTATIN (LIPITOR) 10 MG TABLET    Take 10 mg by mouth daily.   CALCIUM-VITAMIN D PO    Take 500 mg by mouth daily.   CLONIDINE (CATAPRES) 0.1 MG TABLET    Take 0.1 mg by mouth 3 (three) times daily.    DOCUSATE SODIUM (COLACE PO)    Take by mouth 2 (two) times daily.   DONEPEZIL (ARICEPT) 10 MG TABLET    Take 10 mg by mouth at bedtime.   HYDROCODONE-ACETAMINOPHEN (NORCO) 5-325 MG PER TABLET    Take 1 by mouth every 6 hours as needed for mild to moderate pain,Take two tablets by mouth every 6 hours as needed for severe pain   LABETALOL (NORMODYNE) 300 MG TABLET    Take 300 mg by mouth 2 (two) times daily.   LORATADINE (CLARITIN) 10 MG TABLET    Take 10 mg by mouth daily.   LORAZEPAM (ATIVAN) 2 MG TABLET    Take 2 mg by mouth every 8 (eight) hours as needed for anxiety.   MEMANTINE (NAMENDA XR) 14 MG CP24 24 HR CAPSULE    Take 14 mg by mouth.  Modified Medications   No medications on file  Discontinued Medications   No medications on file  Physical Exam: Vitals:   05/13/16 1451  BP: 100/66  Pulse: 70  Resp: 18  Temp: 98.4 F (36.9 C)  SpO2: 95%  Weight: 148 lb (67.1 kg)  Height: 5\' 1"  (1.549 m)    Physical Exam  Constitutional: She appears well-developed and well-nourished. No distress.  HENT:  Head: Normocephalic and atraumatic.  Cardiovascular: Normal rate, regular rhythm and normal heart sounds.   Pulmonary/Chest: Effort normal and breath sounds normal.  Abdominal: Soft. Bowel sounds are normal.  Musculoskeletal: She exhibits no edema or tenderness.  Neurological: She is alert.  Skin: Skin is warm and dry. She is not diaphoretic.  Psychiatric:  Advanced dementia, mumbles      Labs reviewed: Basic Metabolic Panel:  Recent Labs  78/29/5610/05/25 10/17/15  NA 142 142  K 4.1 3.9  BUN 25* 25*  CREATININE 1.7* 9.2*  * Cr of 9.2 entered in error* should be Cr of 1.72 on 10/17/15 Liver Function Tests:  Recent Labs  07/17/15  07/18/15 10/17/15  AST 17 17 15   ALT 13 13 14   ALKPHOS 103 103 86   No results for input(s): LIPASE, AMYLASE in the last 8760 hours. No results for input(s): AMMONIA in the last 8760 hours. CBC:  Recent Labs  10/17/15  WBC 8.1  HGB 11.7*  HCT 35*  PLT 259   CBG: No results for input(s): GLUCAP in the last 8760 hours. TSH: No results for input(s): TSH in the last 8760 hours. A1C: No results found for: HGBA1C Lipid Panel:  Recent Labs  07/18/15 10/17/15 11/16/15  CHOL 145 131 120  HDL 38 30* 37  LDLCALC 80 59 64  TRIG 136 211* 95    Assessment/Plan 1. Essential hypertension, benign Blood pressure stable, conts on catapres, norvasc and labetalol   2. Allergic rhinitis due to pollen, unspecified chronicity, unspecified seasonality Claritin changed to as needed last month, pt tolerated well.   3. Early onset Alzheimer's disease with behavioral disturbance With slow progressive decline, conts on aricept and namenda   4. CKD (chronic kidney disease) stage 3, GFR 30-59 ml/min Remains stable, Encourage proper hydration and to avoid nephrotoxic medications   5. Anemia of chronic disease Has been stable, will monitor H/H   Lilit Cinelli K. Biagio BorgEubanks, AGNP  [facility] [phone](Monday-Friday 8 am - 5 pm) [phone] (after hours)

## 2016-06-10 ENCOUNTER — Non-Acute Institutional Stay (SKILLED_NURSING_FACILITY): Payer: Medicare Other | Admitting: Nurse Practitioner

## 2016-06-10 ENCOUNTER — Encounter: Payer: Self-pay | Admitting: Nurse Practitioner

## 2016-06-10 DIAGNOSIS — F02818 Dementia in other diseases classified elsewhere, unspecified severity, with other behavioral disturbance: Secondary | ICD-10-CM

## 2016-06-10 DIAGNOSIS — F411 Generalized anxiety disorder: Secondary | ICD-10-CM

## 2016-06-10 DIAGNOSIS — I10 Essential (primary) hypertension: Secondary | ICD-10-CM | POA: Diagnosis not present

## 2016-06-10 DIAGNOSIS — N183 Chronic kidney disease, stage 3 unspecified: Secondary | ICD-10-CM

## 2016-06-10 DIAGNOSIS — F0281 Dementia in other diseases classified elsewhere with behavioral disturbance: Secondary | ICD-10-CM

## 2016-06-10 DIAGNOSIS — G300 Alzheimer's disease with early onset: Secondary | ICD-10-CM

## 2016-06-10 DIAGNOSIS — K5900 Constipation, unspecified: Secondary | ICD-10-CM | POA: Diagnosis not present

## 2016-06-10 NOTE — Progress Notes (Signed)
Patient ID: Amanda [name], female   DOB: 08-08-1946, 70 y.o.   MRN: [id number]    [facility]   Place of Service: SNF (31)  PCP: Marga MelnickWilliam Hopper, MD  No Known Allergies  Chief Complaint  Patient presents with  . Medical Management of Chronic Issues    Routine Visit    HPI:  Patient is a 70 y.o. female [facility] follow up on chronic conditions.  Pt with a PMH of renal insufficieny, dementia with behaviors, HTN and psychosis. Pt has been doing well in the last month. Psych services saw pt and placed on depakote however staff reporting no increase in anxiety, agitation or behaviors.  Due to the advance nature of her dementia aricept has been stopped.  Pt without any signs or symptoms of pain  Review of Systems:  Review of Systems  Unable to perform ROS: Dementia    Past Medical History:  Diagnosis Date  . Dementia    "alzheimer's" (05/18/2012)  . Fall 05/17/2012  . Fracture of medial wall of orbit (HCC) 05/17/2012  . Gout    "?feet" (05/18/2012)  . Hypertension   . Incontinence of urine   . Stroke [facility](HCC) ~ 2003   "slight memory loss" (05/18/2012)  . Wrist fracture, bilateral 05/17/2012   "fell down steps" (05/18/2012)   Past Surgical History:  Procedure Laterality Date  . VAGINAL HYSTERECTOMY     Social History:   reports that she has quit smoking. Her smoking use included Cigarettes. She has never used smokeless tobacco. She reports that she does not drink alcohol or use drugs.  Family History  Problem Relation Age of Onset  . Dementia Mother   . Dementia Father   . Dementia Sister   . Dementia Brother     Medications: Patient's Medications  New Prescriptions   No medications on file  Previous Medications   ACETAMINOPHEN (TYLENOL) 325 MG TABLET    Take 650 mg by mouth every 6 (six) hours as needed for moderate pain or fever.   AMBULATORY NON FORMULARY MEDICATION    Give 1 Magic cup by mouth  twice daily for supplement.   AMLODIPINE (NORVASC) 10 MG TABLET    Take 10 mg by mouth daily.   ATORVASTATIN (LIPITOR) 10 MG TABLET    Take 10 mg by mouth daily.   CALCIUM-VITAMIN D PO    Take 500 mg by mouth daily.   CLONIDINE (CATAPRES) 0.1 MG TABLET    Take 0.1 mg by mouth 3 (three) times daily.    DIVALPROEX (DEPAKOTE SPRINKLE) 125 MG CAPSULE    Take 125 mg by mouth 2 (two) times daily.   DOCUSATE SODIUM (COLACE PO)    Take by mouth 2 (two) times daily.   HYDROCODONE-ACETAMINOPHEN (NORCO) 5-325 MG PER TABLET    Take 1 by mouth every 6 hours as needed for mild to moderate pain,Take two tablets by mouth every 6 hours as needed for severe pain   LABETALOL (NORMODYNE) 300 MG TABLET    Take 300 mg by mouth 2 (two) times daily.   LORATADINE (CLARITIN) 10 MG TABLET    Take 10 mg by mouth daily.   LORAZEPAM (ATIVAN) 2 MG TABLET    Take 2 mg by mouth every 8 (eight) hours as needed for anxiety.   MEMANTINE (NAMENDA XR) 14 MG CP24 24 HR CAPSULE    Take 14 mg by mouth.  Modified Medications   No medications on file  Discontinued Medications   DONEPEZIL (ARICEPT) 10 MG TABLET    Take 10 mg by mouth at bedtime.     Physical Exam: Vitals:   06/10/16 1028  BP: 105/67  Pulse: 64  Temp: 97.5 F (36.4 C)  Weight: 148 lb (67.1 kg)  Height: 5\' 1"  (1.549 m)    Physical Exam  Constitutional: She appears well-developed and well-nourished. No distress.  HENT:  Head: Normocephalic and atraumatic.  Grinds teeth  Cardiovascular: Normal rate, regular rhythm and normal heart sounds.   Pulmonary/Chest: Effort normal and breath sounds normal.  Abdominal: Soft. Bowel sounds are normal.  Musculoskeletal: She exhibits edema (trace bilaterally). She exhibits no tenderness.  Neurological: She is alert.  Skin: Skin is warm and dry. She is not diaphoretic.  Psychiatric:  Advanced dementia, mumbles      Labs reviewed: Basic Metabolic Panel:  Recent Labs  16/05/9610/10/16 10/17/15 04/12/16  NA 142 142 144  K  4.1 3.9 3.8  BUN 25* 25* 35*  CREATININE 1.7* 9.2* 2.1*  * Cr of 9.2 entered in error* should be Cr of 1.72 on 10/17/15 Liver Function Tests:  Recent Labs  07/18/15 10/17/15 04/12/16  AST 17 15 17   ALT 13 14 18   ALKPHOS 103 86 82   No results for input(s): LIPASE, AMYLASE in the last 8760 hours. No results for input(s): AMMONIA in the last 8760 hours. CBC:  Recent Labs  10/17/15 04/12/16  WBC 8.1 8.4  HGB 11.7* 11.8*  HCT 35* 36  PLT 259 267   CBG: No results for input(s): GLUCAP in the last 8760 hours. TSH: No results for input(s): TSH in the last 8760 hours. A1C: No results found for: HGBA1C Lipid Panel:  Recent Labs  07/18/15 10/17/15 11/16/15  CHOL 145 131 120  HDL 38 30* 37  LDLCALC 80 59 64  TRIG 136 211* 95    Assessment/Plan 1. Essential hypertension, benign Blood pressure stable, will cont norvasc, catapres and labetalol   2. Early onset Alzheimer's disease with behavioral disturbance Advanced dementia, there has been no increase in behaviors at this time. In the past pt has become very agitated and combative on medications such as Depakote, will titrate off at this time as not providing much benefit.   3. CKD (chronic kidney disease) stage 3, GFR 30-59 ml/min Encourage proper hydration and to avoid NSAIDS (Aleve, Advil, Motrin, Ibuprofen) or other nephrotoxic medications.   4. Constipation, unspecified constipation type Controlled on current regimen   5. Anxiety state Stable, pt not on any daily medication for anxiety, using ativan PRN and has not needed recently  Pt without signs of pain, has PRN norco ordered but no current pain and has not used in over 30 days, will dc at this time and can use tylenol as needed   Dwayn Moravek K. Biagio BorgEubanks, AGNP  [facility] [phone](Monday-Friday 8 am - 5 pm) [phone] (after hours)

## 2016-07-10 ENCOUNTER — Non-Acute Institutional Stay (SKILLED_NURSING_FACILITY): Payer: Medicare Other | Admitting: Nurse Practitioner

## 2016-07-10 ENCOUNTER — Encounter: Payer: Self-pay | Admitting: Nurse Practitioner

## 2016-07-10 DIAGNOSIS — I10 Essential (primary) hypertension: Secondary | ICD-10-CM | POA: Diagnosis not present

## 2016-07-10 DIAGNOSIS — K5900 Constipation, unspecified: Secondary | ICD-10-CM | POA: Diagnosis not present

## 2016-07-10 DIAGNOSIS — B351 Tinea unguium: Secondary | ICD-10-CM

## 2016-07-10 DIAGNOSIS — F0281 Dementia in other diseases classified elsewhere with behavioral disturbance: Secondary | ICD-10-CM

## 2016-07-10 DIAGNOSIS — G300 Alzheimer's disease with early onset: Secondary | ICD-10-CM | POA: Diagnosis not present

## 2016-07-10 NOTE — Progress Notes (Signed)
Patient ID: Amanda [name], female   DOB: 05/12/1946, 70 y.o.   MRN: [id number]    [facility]   Place of Service: SNF (31)  PCP: Marga MelnickWilliam Hopper, MD  No Known Allergies  Chief Complaint  Patient presents with  . Medical Management of Chronic Issues    Routine Visit    HPI:  Patient is a 70 y.o. female [facility] follow up on chronic conditions.  Pt with a PMH of renal insufficieny, dementia with behaviors, HTN and psychosis. pts husband with pt today. Reports she is doing well. Some constipation however controlled on colace.  pts husband concerns over pts toenails. Overgrown, thick and dark colored. No nursing concern today.   Review of Systems:  Review of Systems  Unable to perform ROS: Dementia    Past Medical History:  Diagnosis Date  . Dementia    "alzheimer's" (05/18/2012)  . Fall 05/17/2012  . Fracture of medial wall of orbit (HCC) 05/17/2012  . Gout    "?feet" (05/18/2012)  . Hypertension   . Incontinence of urine   . Stroke [facility](HCC) ~ 2003   "slight memory loss" (05/18/2012)  . Wrist fracture, bilateral 05/17/2012   "fell down steps" (05/18/2012)   Past Surgical History:  Procedure Laterality Date  . VAGINAL HYSTERECTOMY     Social History:   reports that she has quit smoking. Her smoking use included Cigarettes. She has never used smokeless tobacco. She reports that she does not drink alcohol or use drugs.  Family History  Problem Relation Age of Onset  . Dementia Mother   . Dementia Father   . Dementia Sister   . Dementia Brother     Medications: Patient's Medications  New Prescriptions   No medications on file  Previous Medications   ACETAMINOPHEN (TYLENOL) 325 MG TABLET    Take 650 mg by mouth every 6 (six) hours as needed for moderate pain or fever.   AMBULATORY NON FORMULARY MEDICATION    Give 1 Magic cup by mouth twice daily for supplement.   AMLODIPINE (NORVASC) 10 MG TABLET     Take 10 mg by mouth daily.   ATORVASTATIN (LIPITOR) 10 MG TABLET    Take 10 mg by mouth daily.   CALCIUM-VITAMIN D PO    Take 500 mg by mouth daily.   CLONIDINE (CATAPRES) 0.1 MG TABLET    Take 0.1 mg by mouth 3 (three) times daily.    DOCUSATE SODIUM (COLACE PO)    Take by mouth 2 (two) times daily.   LABETALOL (NORMODYNE) 300 MG TABLET    Take 300 mg by mouth 2 (two) times daily.   LORATADINE (CLARITIN) 10 MG TABLET    Take 10 mg by mouth daily.   LORAZEPAM (ATIVAN) 2 MG TABLET    Take 2 mg by mouth every 8 (eight) hours as needed for anxiety.   MEMANTINE (NAMENDA XR) 14 MG CP24 24 HR CAPSULE    Take 14 mg by mouth.  Modified Medications   No medications on file  Discontinued Medications   HYDROCODONE-ACETAMINOPHEN (NORCO) 5-325 MG PER TABLET    Take 1 by mouth every 6 hours as needed for mild to moderate pain,Take two tablets by mouth every 6 hours as needed for severe pain     Physical Exam: Vitals:   07/10/16 1051  BP: 130/70  Pulse: 69  Resp: 18  Temp: 97.6 F (36.4 C)  Weight: 148 lb (67.1 kg)  Height: 5\' 1"  (1.549 m)    Physical Exam  Constitutional: She appears well-developed and well-nourished. No distress.  HENT:  Head: Normocephalic and atraumatic.  Grinds teeth  Cardiovascular: Normal rate, regular rhythm and normal heart sounds.   Pulmonary/Chest: Effort normal and breath sounds normal.  Abdominal: Soft. Bowel sounds are normal.  Musculoskeletal: She exhibits edema (trace bilaterally). She exhibits no tenderness.  Neurological: She is alert.  Skin: Skin is warm and dry. She is not diaphoretic.  Thick, brittle, discolored toenails on bilateral feet  Psychiatric:  Advanced dementia, mumbles      Labs reviewed: Basic Metabolic Panel:  Recent Labs  09/81/1902/04/26 04/12/16  NA 142 144  K 3.9 3.8  BUN 25* 35*  CREATININE 9.2* 2.1*  * Cr of 9.2 entered in error* should be Cr of 1.72 on 10/17/15 Liver Function Tests:  Recent Labs  07/18/15 10/17/15 04/12/16    AST 17 15 17   ALT 13 14 18   ALKPHOS 103 86 82   No results for input(s): LIPASE, AMYLASE in the last 8760 hours. No results for input(s): AMMONIA in the last 8760 hours. CBC:  Recent Labs  10/17/15 04/12/16  WBC 8.1 8.4  HGB 11.7* 11.8*  HCT 35* 36  PLT 259 267   CBG: No results for input(s): GLUCAP in the last 8760 hours. TSH: No results for input(s): TSH in the last 8760 hours. A1C: No results found for: HGBA1C Lipid Panel:  Recent Labs  07/18/15 10/17/15 11/16/15  CHOL 145 131 120  HDL 38 30* 37  LDLCALC 80 59 64  TRIG 136 211* 95    Assessment/Plan 1. Essential hypertension, benign Blood pressure remains stable, cont on current regimen.   2. Early onset Alzheimer's disease with behavioral disturbance Stable, without acute decline. conts on namenda  3. Constipation, unspecified constipation type Well controlled on colace, cont current regimen   4. Onychomycosis of toenail Pt on the podiatry list this month for care of toenails   Yassmin Binegar K. Biagio BorgEubanks, AGNP  [facility] [phone](Monday-Friday 8 am - 5 pm) [phone] (after hours)

## 2016-08-06 ENCOUNTER — Non-Acute Institutional Stay (SKILLED_NURSING_FACILITY): Payer: Medicare Other | Admitting: Internal Medicine

## 2016-08-06 ENCOUNTER — Encounter: Payer: Self-pay | Admitting: Internal Medicine

## 2016-08-06 DIAGNOSIS — Z8739 Personal history of other diseases of the musculoskeletal system and connective tissue: Secondary | ICD-10-CM | POA: Diagnosis not present

## 2016-08-06 DIAGNOSIS — M25531 Pain in right wrist: Secondary | ICD-10-CM

## 2016-08-06 LAB — CBC AND DIFFERENTIAL
HCT: 41 % (ref 36–46)
Hemoglobin: 13.1 g/dL (ref 12.0–16.0)
PLATELETS: 283 10*3/uL (ref 150–399)
WBC: 9.1 10^3/mL

## 2016-08-06 NOTE — Progress Notes (Signed)
   [facility] Room 117A  PCP: Marga MelnickWilliam Charlton Boule, MD [address] [place] [address]  This is a nursing facility follow up for specific acute issue of pain and tenderness of the dorsum of the hand.  Interim medical record and care since last [facility] visit was updated with review of diagnostic studies and change in clinical status since last visit were documented.  HPI: Last night her husband noted her fingers were swollen. This morning approximately 9 AM he noted swelling over the dorsum of the right hand. He requested a film of the hand. There is no history of recent trauma. She's had no venipunctures @ this site. The patient can give no meaningful history because of dementia. Past history includes gout,but the husband says he has no knowledge of her having gout.She is not on HCTZ.  The husband was very upset that she had not been seen immediately after he noted these changes in her hand. I explained that had been attending a patient who was having difficulty breathing. He inquired as to his options in such a case. I stated that he could take her the emergency room but I would anticipate a wait of 8 hours or more to be seen. He informed me he had  reported his concerns to "corporate".  She's had a history of wrist fractures bilaterally in 2013 having fallen down steps. She's had a history of stroke, hypertension, gout (see above) and dementia.  She has had renal insufficiency, last creatinine on record was 2.1 on 9/3. She's also had a mild but stable anemia with hemoglobin of 11.8. Also in 2013 she sustained fracture of the medial wall of the orbit. Surgeries include hysterectomy.  Review of systems: Dementia prevented any responses.   Physical exam:  Pertinent or positive findings: The patient is unresponsive verbally. She resisted the examination. She kept her eyes tightly closed. She was drooling consistently. Breath sounds are distant. Heart sounds are  also decreased. Pedal pulses are decreased. She has 1/2+ edema. There is a boss measuring 32 x 40 mm over the dorsum of the right wrist.There was no initial tenderness to palpation but with deep palpation she expressed discomfort. There was mild erythema in this area without ecchymosis or purulence  General appearance:Adequately nourished; no acute distress , increased work of breathing is present.   Lymphatic: No lymphadenopathy about the head, neck, axilla or epitrochlear areas but exam was limited by rigidity of the upper extremities. .  Ears:  External ear exam shows no significant lesions or deformities.   Nose:  External nasal examination shows no deformity or inflammation. Nasal mucosa are pink and moist without lesions ,exudates Neck:  No masses, tenderness noted.    Lungs:Chest  without wheezes, rhonchi,rales , rubs. Abdomen:Bowel sounds are normal. Abdomen is soft and nontender with no organomegaly, hernias,masses. GU: deferred  Extremities:  No cyanosis, clubbing  Neurologic exam : Strength  in upper & lower extremities,balance,Rhomberg,finger to nose testing could not be completed due to clinical state Skin: Warm & dry w/o tenting. No significant rash.   #1 right wrist pain and swelling without history of trauma. Past history of gout as per chart (husband denies) Plan: CBC and differential, uric acid, mobile imaging. Indocin 3 times a day after meals 5 days. Cephalexin 500 mg twice a day 7 days pending studies.

## 2016-08-06 NOTE — Assessment & Plan Note (Signed)
Indocin 25 mg 3 times a day after meals 5 days Uric acid

## 2016-08-06 NOTE — Patient Instructions (Signed)
See Current Assessment & Plan in Problem List under specific Diagnosis 

## 2016-08-14 ENCOUNTER — Encounter: Payer: Self-pay | Admitting: Nurse Practitioner

## 2016-08-14 ENCOUNTER — Non-Acute Institutional Stay (SKILLED_NURSING_FACILITY): Payer: Medicare Other | Admitting: Nurse Practitioner

## 2016-08-14 DIAGNOSIS — M10341 Gout due to renal impairment, right hand: Secondary | ICD-10-CM | POA: Diagnosis not present

## 2016-08-14 DIAGNOSIS — I10 Essential (primary) hypertension: Secondary | ICD-10-CM

## 2016-08-14 DIAGNOSIS — N183 Chronic kidney disease, stage 3 unspecified: Secondary | ICD-10-CM

## 2016-08-14 DIAGNOSIS — D638 Anemia in other chronic diseases classified elsewhere: Secondary | ICD-10-CM | POA: Diagnosis not present

## 2016-08-14 DIAGNOSIS — F0281 Dementia in other diseases classified elsewhere with behavioral disturbance: Secondary | ICD-10-CM

## 2016-08-14 DIAGNOSIS — G300 Alzheimer's disease with early onset: Secondary | ICD-10-CM | POA: Diagnosis not present

## 2016-08-14 DIAGNOSIS — F02818 Dementia in other diseases classified elsewhere, unspecified severity, with other behavioral disturbance: Secondary | ICD-10-CM

## 2016-08-14 NOTE — Progress Notes (Signed)
Patient ID: Amanda [name], female   DOB: 07/06/1946, 71 y.o.   MRN: [id number]    [facility]   Place of Service: SNF (31)  PCP: Amanda MelnickWilliam Hopper, MD  No Known Allergies  Chief Complaint  Patient presents with  . Medical Management of Chronic Issues    Routine Visit    HPI:  Patient is a 71 y.o. female [facility] follow up on chronic conditions.  Pt with a PMH of renal insufficieny, dementia with behaviors, HTN and psychosis.  Pt seen last week due to increased pain in right hand/wrist with warmth and swelling. Pt with hx of gout and uric acid was elevated. Pt was treated with prednisone, and allopurinol with good results. Swelling and pain has improved. Pt saw podiatrist last week. Husband without acute concerns at this time. Staff has no concerns.   Review of Systems:  Review of Systems  Unable to perform ROS: Dementia    Past Medical History:  Diagnosis Date  . Dementia    "alzheimer's" (05/18/2012)  . Fall 05/17/2012  . Fracture of medial wall of orbit (HCC) 05/17/2012  . Gout    "?feet" (05/18/2012)  . Hypertension   . Incontinence of urine   . Stroke [facility](HCC) ~ 2003   "slight memory loss" (05/18/2012)  . Wrist fracture, bilateral 05/17/2012   "fell down steps" (05/18/2012)   Past Surgical History:  Procedure Laterality Date  . VAGINAL HYSTERECTOMY     Social History:   reports that she has quit smoking. Her smoking use included Cigarettes. She has never used smokeless tobacco. She reports that she does not drink alcohol or use drugs.  Family History  Problem Relation Age of Onset  . Dementia Mother   . Dementia Father   . Dementia Sister   . Dementia Brother     Medications: Patient's Medications  New Prescriptions   No medications on file  Previous Medications   ACETAMINOPHEN (TYLENOL) 325 MG TABLET    Take 650 mg by mouth every 6 (six) hours as needed for moderate pain or fever.   ALLOPURINOL (ZYLOPRIM) 100 MG TABLET    Take 100 mg by mouth daily.   AMBULATORY NON FORMULARY MEDICATION    Give 1 Magic cup by mouth twice daily for supplement.   AMLODIPINE (NORVASC) 10 MG TABLET    Take 10 mg by mouth daily.   ATORVASTATIN (LIPITOR) 10 MG TABLET    Take 10 mg by mouth daily.   CALCIUM-VITAMIN D PO    Take 500 mg by mouth daily.   CLONIDINE (CATAPRES) 0.1 MG TABLET    Take 0.1 mg by mouth 3 (three) times daily.    DOCUSATE SODIUM (COLACE PO)    Take by mouth 2 (two) times daily.   LABETALOL (NORMODYNE) 300 MG TABLET    Take 300 mg by mouth 2 (two) times daily.   LORATADINE (CLARITIN) 10 MG TABLET    Take 10 mg by mouth daily.   LORAZEPAM (ATIVAN) 2 MG/ML INJECTION    Lorazepam 2mg /ml syringe Give 0.5mg  (GEL) every 8 hours as needed for anxiety. May use tablet form   MEMANTINE (NAMENDA XR) 14 MG CP24 24 HR CAPSULE    Take 14 mg by mouth.  Modified Medications   No medications on file  Discontinued Medications   LORAZEPAM (ATIVAN) 2 MG TABLET    Take 2 mg by mouth every 8 (eight) hours as needed for anxiety.  Physical Exam: Vitals:   08/14/16 1109  BP: 132/77  Pulse: 68  Resp: 18  Temp: 98.8 F (37.1 C)  SpO2: 95%  Weight: 145 lb (65.8 kg)  Height: 5\' 1"  (1.549 m)    Physical Exam  Constitutional: She appears well-developed and well-nourished. No distress.  HENT:  Head: Normocephalic and atraumatic.  Grinds teeth  Cardiovascular: Normal rate, regular rhythm and normal heart sounds.   Pulmonary/Chest: Effort normal and breath sounds normal.  Abdominal: Soft. Bowel sounds are normal.  Musculoskeletal: She exhibits edema (trace bilaterally). She exhibits no tenderness.  Neurological: She is alert.  Skin: Skin is warm and dry. She is not diaphoretic.  Psychiatric:  Advanced dementia, mumbles      Labs reviewed: Basic Metabolic Panel:  Recent Labs  16/10/96 04/12/16  NA 142 144  K 3.9 3.8  BUN 25* 35*  CREATININE 9.2* 2.1*  * Cr of 9.2 entered in  error* should be Cr of 1.72 on 10/17/15 Liver Function Tests:  Recent Labs  10/17/15 04/12/16  AST 15 17  ALT 14 18  ALKPHOS 86 82   No results for input(s): LIPASE, AMYLASE in the last 8760 hours. No results for input(s): AMMONIA in the last 8760 hours. CBC:  Recent Labs  10/17/15 04/12/16 08/06/16  WBC 8.1 8.4 9.1  HGB 11.7* 11.8* 13.1  HCT 35* 36 41  PLT 259 267 283   CBG: No results for input(s): GLUCAP in the last 8760 hours. TSH: No results for input(s): TSH in the last 8760 hours. A1C: No results found for: HGBA1C Lipid Panel:  Recent Labs  10/17/15 11/16/15  CHOL 131 120  HDL 30* 37  LDLCALC 59 64  TRIG 211* 95   Uric Acid 08/06/16: 9.4  Assessment/Plan 1. Gout of right hand due to renal impairment, unspecified chronicity Improved, conts on allopurinol 100 mg daily for maintenance dose. Will follow up uric acid level   2. Essential hypertension, benign Stable on current regimen  3. Early onset Alzheimer's disease with behavioral disturbance Advanced dementia, remains without acute change. Cont on namenda and aricept  4. CKD (chronic kidney disease) stage 3, GFR 30-59 ml/min Will follow up CMP with next labs   5. Anemia of chronic disease hgb improved on recent labs.    [name]. Biagio Borg  [facility] [phone] 8 am - 5 pm) [phone] (after hours)

## 2016-08-15 LAB — BASIC METABOLIC PANEL
BUN: 33 mg/dL — AB (ref 4–21)
CREATININE: 1.6 mg/dL — AB (ref 0.5–1.1)
GLUCOSE: 82 mg/dL
POTASSIUM: 3.9 mmol/L (ref 3.4–5.3)
Sodium: 142 mmol/L (ref 137–147)

## 2016-09-11 ENCOUNTER — Encounter: Payer: Self-pay | Admitting: Nurse Practitioner

## 2016-09-11 ENCOUNTER — Non-Acute Institutional Stay (SKILLED_NURSING_FACILITY): Payer: Medicare Other | Admitting: Nurse Practitioner

## 2016-09-11 DIAGNOSIS — K5901 Slow transit constipation: Secondary | ICD-10-CM | POA: Diagnosis not present

## 2016-09-11 NOTE — Progress Notes (Signed)
Patient ID: Amanda [name], female   DOB: [birth year], 71 y.o.   MRN: [id number]    Nursing Home Location: [facility]   Place of Service: SNF (31)  PCP: Marga Melnick, MD  No Known Allergies  Chief Complaint  Patient presents with  . Acute Visit    Constipation    HPI:  Patient is a 71 y.o. female seen today at [facility] for constipation. Pt with a PMH of renal insufficieny, dementia with behaviors, HTN and psychosis. Husband requesting visit due to constipation. Reporting staff needing to give PRN MOM routinely and would like something given more regular. Pt eating well. No vomiting noted. Does not appear to have nausea or abdominal pain but ROS limited due to dementia.   Review of Systems:  Review of Systems  Unable to perform ROS: Dementia    Past Medical History:  Diagnosis Date  . Dementia    "alzheimer's" (05/18/2012)  . Fall 05/17/2012  . Fracture of medial wall of orbit (HCC) 05/17/2012  . Gout    "?feet" (05/18/2012)  . Hypertension   . Incontinence of urine   . Stroke [facility]) ~ 2003   "slight memory loss" (05/18/2012)  . Wrist fracture, bilateral 05/17/2012   "fell down steps" (05/18/2012)   Past Surgical History:  Procedure Laterality Date  . VAGINAL HYSTERECTOMY     Social History:   reports that she has quit smoking. Her smoking use included Cigarettes. She has never used smokeless tobacco. She reports that she does not drink alcohol or use drugs.  Family History  Problem Relation Age of Onset  . Dementia Mother   . Dementia Father   . Dementia Sister   . Dementia Brother     Medications: Patient's Medications  New Prescriptions   No medications on file  Previous Medications   ACETAMINOPHEN (TYLENOL) 325 MG TABLET    Take 650 mg by mouth every 6 (six) hours as needed for moderate pain or fever.   ALLOPURINOL (ZYLOPRIM) 100 MG TABLET    Take 100 mg by mouth daily.   AMBULATORY NON FORMULARY MEDICATION    Give 1 Magic cup by mouth  twice daily for supplement.   AMLODIPINE (NORVASC) 10 MG TABLET    Take 10 mg by mouth daily.   ATORVASTATIN (LIPITOR) 10 MG TABLET    Take 10 mg by mouth daily.   CALCIUM-VITAMIN D PO    Take 500 mg by mouth daily.   CLONIDINE (CATAPRES) 0.1 MG TABLET    Take 0.1 mg by mouth 3 (three) times daily.    DOCUSATE SODIUM (COLACE PO)    Take by mouth 2 (two) times daily.   LABETALOL (NORMODYNE) 300 MG TABLET    Take 300 mg by mouth 2 (two) times daily.   LORATADINE (CLARITIN) 10 MG TABLET    Take 10 mg by mouth daily.   MEMANTINE (NAMENDA XR) 14 MG CP24 24 HR CAPSULE    Take 14 mg by mouth.  Modified Medications   No medications on file  Discontinued Medications   LORAZEPAM (ATIVAN) 2 MG/ML INJECTION    Lorazepam 2mg /ml syringe Give 0.5mg  (GEL) every 8 hours as needed for anxiety. May use tablet form     Physical Exam: Vitals:   09/11/16 1600  BP: (!) 104/47  Pulse: 64  Resp: 18  Temp: 98.2 F (36.8 C)  SpO2: 96%  Weight: 145 lb (65.8 kg)  Height: 5\' 1"  (1.549 m)    Physical Exam  Constitutional: She  appears well-developed and well-nourished. No distress.  HENT:  Head: Normocephalic and atraumatic.  Grinds teeth  Cardiovascular: Normal rate, regular rhythm and normal heart sounds.   Pulmonary/Chest: Effort normal and breath sounds normal.  Abdominal: Soft. Bowel sounds are normal. She exhibits no distension and no mass. There is no tenderness. There is no rebound and no guarding.  Musculoskeletal: She exhibits edema (trace bilaterally). She exhibits no tenderness.  Neurological: She is alert.  Skin: Skin is warm and dry. She is not diaphoretic.  Psychiatric:  Advanced dementia, mumbles      Labs reviewed: Basic Metabolic Panel:  Recent Labs  16/05/9602/09/17 04/12/16 08/15/16  NA 142 144 142  K 3.9 3.8 3.9  BUN 25* 35* 33*  CREATININE 9.2* 2.1* 1.6*  * Cr of 9.2 entered in error* should be Cr of 1.72 on 10/17/15 Liver Function Tests:  Recent Labs  10/17/15 04/12/16  AST 15  17  ALT 14 18  ALKPHOS 86 82   No results for input(s): LIPASE, AMYLASE in the last 8760 hours. No results for input(s): AMMONIA in the last 8760 hours. CBC:  Recent Labs  10/17/15 04/12/16 08/06/16  WBC 8.1 8.4 9.1  HGB 11.7* 11.8* 13.1  HCT 35* 36 41  PLT 259 267 283   CBG: No results for input(s): GLUCAP in the last 8760 hours. TSH: No results for input(s): TSH in the last 8760 hours. A1C: No results found for: HGBA1C Lipid Panel:  Recent Labs  10/17/15 11/16/15  CHOL 131 120  HDL 30* 37  LDLCALC 59 64  TRIG 211* 95   Uric Acid 08/06/16: 9.4  Uric Acid 08/15/16: 7.1  Assessment/Plan 1. Slow transit constipation Pt with good bowel sounds and no abdominal discomfort on exam. Currently on colace 100 mg twice daily. Will add miralax 17 gm daily and have staff encourage hydration to help. Pt already on toileting schedule.  Janene HarveyJessica K. Biagio BorgEubanks, AGNP  [facility] [phone](Monday-Friday 8 am - 5 pm) [phone] (after hours)

## 2016-09-17 ENCOUNTER — Encounter: Payer: Self-pay | Admitting: Internal Medicine

## 2016-09-17 ENCOUNTER — Non-Acute Institutional Stay (SKILLED_NURSING_FACILITY): Payer: Medicare Other | Admitting: Internal Medicine

## 2016-09-17 DIAGNOSIS — K5909 Other constipation: Secondary | ICD-10-CM | POA: Insufficient documentation

## 2016-09-17 NOTE — Patient Instructions (Signed)
See assessment and plan under diagnosis entered in the problem list

## 2016-09-17 NOTE — Progress Notes (Signed)
   [facility]  Marga MelnickWilliam Jaquanda Wickersham, MD [address] [place] [address]  This is a nursing facility follow up for specific acute issue of Suboptimal bowel movements.   Interim medical record and care since last [facility] was updated with review of diagnostic studies and change in clinical status since last visit were documented.  OZH:YQMVHPI:Over the past week milk of magnesia, suppository, and MiraLAX of no benefit. The patient was seen 09/11/16 and diagnosed with slow transit constipation. She was found to have good bowel sounds with no abdominal tenderness on palpation. Miralax was started at that time and hydration was encouraged. Husband says no satisfactory BM for 2 weeks.. This is a recurrent problem. Response to enemas has been delayed in the past The patient is on no narcotics or other agents which might cause constipation. No record of colonoscopy in the chart. She has a history of gout and is on allopurinol 100 mg daily. Her recent creatinine was 1.6 on 1/6.  Review of systems: Dementia prevented ROS. Husband reports no nausea vomiting or other GI symptoms.  Physical exam:  Pertinent or positive findings: The patient is nonverbal and follows no commands. She has patchy alopecia. Has bilateral ptosis. OD exotropia is present. She exhibits repetitive  mastication maneuvers and grinding of her teeth. Thyroid exam was limited by the patient flexing her neck forward. Heart rhythm is slow and slightly irregular. The abdomen is protuberant but not distended. She has active bowel sounds in all quadrants. There is no tenderness to palpation. Clinically there is no evidence whatsoever of an ileus. She has 1/2+ edema at the sock line. Arms are held crossed over the chest. She resists range of motion. She rocks forward when touched. DTRs in the lower extremities are 1+ and equal.  General appearance:Adequately nourished; no acute distress , increased  work of breathing is present.   Lymphatic: No lymphadenopathy about the head, neck, axilla . Eyes: No conjunctival inflammation or lid edema is present. There is no scleral icterus. Ears:  External ear exam shows no significant lesions or deformities.   Nose:  External nasal examination shows no deformity or inflammation. Nasal mucosa are pink and moist without lesions ,exudates Oral exam: lips and gums are healthy appearing. Neck:  No masses, tenderness noted.    Heart:  No gallop, murmur, click, rub .  Lungs:Chest clear to auscultation without wheezes, rhonchi,rales , rubs. GU: deferred  Extremities:  No cyanosis, clubbing Neurologic exam : Balance,Rhomberg,finger to nose testing could not be completed due to clinical state Skin: Warm & dry w/o tenting. No significant lesions or rash.   #1 constipation, probable slow transit. Biscodyl suppositories will be administered every other day 3. If this is not successful fleets enema will be initiated. TSH will be checked to rule out significant hypothyroidism as factor.

## 2016-09-17 NOTE — Assessment & Plan Note (Addendum)
Bisacodyl suppository every other day 3. If this is not successful, fleets enema Check TSH to rule out hypothyroidism

## 2016-09-18 LAB — TSH: TSH: 1.61 u[IU]/mL (ref 0.41–5.90)

## 2016-09-23 ENCOUNTER — Non-Acute Institutional Stay (SKILLED_NURSING_FACILITY): Payer: Medicare Other | Admitting: Nurse Practitioner

## 2016-09-23 ENCOUNTER — Encounter: Payer: Self-pay | Admitting: Nurse Practitioner

## 2016-09-23 DIAGNOSIS — F0281 Dementia in other diseases classified elsewhere with behavioral disturbance: Secondary | ICD-10-CM

## 2016-09-23 DIAGNOSIS — K5901 Slow transit constipation: Secondary | ICD-10-CM

## 2016-09-23 DIAGNOSIS — F02818 Dementia in other diseases classified elsewhere, unspecified severity, with other behavioral disturbance: Secondary | ICD-10-CM

## 2016-09-23 DIAGNOSIS — I10 Essential (primary) hypertension: Secondary | ICD-10-CM

## 2016-09-23 DIAGNOSIS — N183 Chronic kidney disease, stage 3 unspecified: Secondary | ICD-10-CM

## 2016-09-23 DIAGNOSIS — M10341 Gout due to renal impairment, right hand: Secondary | ICD-10-CM | POA: Diagnosis not present

## 2016-09-23 DIAGNOSIS — G300 Alzheimer's disease with early onset: Secondary | ICD-10-CM

## 2016-09-23 NOTE — Progress Notes (Signed)
Patient ID: Amanda [name], female   DOB: 1945/12/24, 71 y.o.   MRN: [id number]    [facility]   Place of Service: SNF (31)  PCP: Marga MelnickWilliam Hopper, MD  No Known Allergies   Code Status: Full Code  Chief Complaint  Patient presents with  . Medical Management of Chronic Issues    Routine Visit    HPI:  Patient is a 71 y.o. female [facility]. Pt with a PMH of renal insufficieny, dementia with behaviors, HTN and psychosis. Pt has been seen multiple times due to constipation, was started on miralax but then  improved with dulcolax suppositories which were given every other day for 3 days. Husband reports she is eating better at this time.  No concerns of pain.  Sleeping well at night.   Review of Systems:  Review of Systems  Unable to perform ROS: Dementia    Past Medical History:  Diagnosis Date  . Dementia    "alzheimer's" (05/18/2012)  . Fall 05/17/2012  . Fracture of medial wall of orbit (HCC) 05/17/2012  . Gout    "? feet" (05/18/2012); uric acid 9.4 on 08/06/16 with R wrist pain; 7.1 on 08/15/16 on Allopurinol  . Hypertension   . Incontinence of urine   . Stroke [facility](HCC) ~ 2003   "slight memory loss" (05/18/2012)  . Wrist fracture, bilateral 05/17/2012   "fell down steps" (05/18/2012)   Past Surgical History:  Procedure Laterality Date  . VAGINAL HYSTERECTOMY     Social History:   reports that she has quit smoking. Her smoking use included Cigarettes. She has never used smokeless tobacco. She reports that she does not drink alcohol or use drugs.  Family History  Problem Relation Age of Onset  . Dementia Mother   . Dementia Father   . Dementia Sister   . Dementia Brother     Medications: Patient's Medications  New Prescriptions   No medications on file  Previous Medications   ACETAMINOPHEN (TYLENOL) 325 MG TABLET    Take 650 mg by mouth every 6 (six) hours as needed for moderate pain or  fever.   ALLOPURINOL (ZYLOPRIM) 100 MG TABLET    Take 100 mg by mouth daily.   AMBULATORY NON FORMULARY MEDICATION    Give 1 Magic cup by mouth twice daily for supplement.   AMLODIPINE (NORVASC) 10 MG TABLET    Take 10 mg by mouth daily.   ATORVASTATIN (LIPITOR) 10 MG TABLET    Take 10 mg by mouth daily.   CALCIUM-VITAMIN D PO    Take 500 mg by mouth daily.   CLONIDINE (CATAPRES) 0.1 MG TABLET    Take 0.1 mg by mouth 3 (three) times daily.    DOCUSATE SODIUM (COLACE PO)    Take by mouth 2 (two) times daily.   LABETALOL (NORMODYNE) 300 MG TABLET    Take 300 mg by mouth 2 (two) times daily.   LORATADINE (CLARITIN) 10 MG TABLET    Take 10 mg by mouth daily.   POLYETHYLENE GLYCOL (MIRALAX / GLYCOLAX) PACKET    Take 17 g by mouth daily.  Modified Medications   No medications on file  Discontinued Medications   No medications on file     Physical Exam: Vitals:   09/23/16 1357  BP: (!) 117/59  Pulse: 69  Resp: 18  Temp: 98.3 F (36.8 C)  SpO2: 99%  Weight: 145 lb (65.8 kg)  Height: 5\' 1"  (  1.549 m)    Physical Exam  Constitutional: She appears well-developed and well-nourished. No distress.  HENT:  Head: Normocephalic and atraumatic.  Grinds teeth  Cardiovascular: Normal rate, regular rhythm and normal heart sounds.   Pulmonary/Chest: Effort normal and breath sounds normal.  Abdominal: Soft. Bowel sounds are normal. She exhibits no distension and no mass. There is no tenderness. There is no rebound and no guarding.  Musculoskeletal: She exhibits edema (trace bilaterally). She exhibits no tenderness.  Neurological: She is alert.  Skin: Skin is warm and dry. She is not diaphoretic.  Psychiatric:  Advanced dementia, mumbles      Labs reviewed: Basic Metabolic Panel:  Recent Labs  16/10/96 04/12/16 08/15/16  NA 142 144 142  K 3.9 3.8 3.9  BUN 25* 35* 33*  CREATININE 9.2* 2.1* 1.6*  * Cr of 9.2 entered in error* should be Cr of 1.72 on 10/17/15 Liver Function Tests:  Recent  Labs  10/17/15 04/12/16  AST 15 17  ALT 14 18  ALKPHOS 86 82   No results for input(s): LIPASE, AMYLASE in the last 8760 hours. No results for input(s): AMMONIA in the last 8760 hours. CBC:  Recent Labs  10/17/15 04/12/16 08/06/16  WBC 8.1 8.4 9.1  HGB 11.7* 11.8* 13.1  HCT 35* 36 41  PLT 259 267 283   CBG: No results for input(s): GLUCAP in the last 8760 hours. TSH:  Recent Labs  09/18/16  TSH 1.61   A1C: No results found for: HGBA1C Lipid Panel:  Recent Labs  10/17/15 11/16/15  CHOL 131 120  HDL 30* 37  LDLCALC 59 64  TRIG 211* 95   Uric Acid 08/06/16: 9.4  Uric Acid 08/15/16: 7.1  Assessment/Plan 1. Slow transit constipation Improved with suppository, will add linzess 72 mcg daily and make miralax PRN May dc colace if having loose stools.    2. Gout of right hand due to renal impairment, unspecified chronicity Uric acid improved but still at goal. Increase allopurinol to 200 mg  3. Essential hypertension, benign -Patient is stable; continue current regimen. Will monitor and make changes as necessary.  4. Early onset Alzheimer's disease with behavioral disturbance Advanced, Progressive worsening of disease. [facility]s with [facility] at [facility].   5. CKD (chronic kidney disease) stage 3, GFR 30-59 ml/min Improved on recent labs. Cont to stay well hydrated and avoid NSAIDS  Maki Hege K. Biagio Borg  [facility] & Adult Medicine [phone] 8 am - 5 pm) [phone] (after hours)

## 2016-10-21 ENCOUNTER — Encounter: Payer: Self-pay | Admitting: Nurse Practitioner

## 2016-10-21 ENCOUNTER — Non-Acute Institutional Stay (SKILLED_NURSING_FACILITY): Payer: Medicare Other | Admitting: Nurse Practitioner

## 2016-10-21 DIAGNOSIS — N183 Chronic kidney disease, stage 3 unspecified: Secondary | ICD-10-CM

## 2016-10-21 DIAGNOSIS — K5901 Slow transit constipation: Secondary | ICD-10-CM | POA: Diagnosis not present

## 2016-10-21 DIAGNOSIS — M10341 Gout due to renal impairment, right hand: Secondary | ICD-10-CM

## 2016-10-21 DIAGNOSIS — R058 Other specified cough: Secondary | ICD-10-CM

## 2016-10-21 DIAGNOSIS — F02818 Dementia in other diseases classified elsewhere, unspecified severity, with other behavioral disturbance: Secondary | ICD-10-CM

## 2016-10-21 DIAGNOSIS — I10 Essential (primary) hypertension: Secondary | ICD-10-CM | POA: Diagnosis not present

## 2016-10-21 DIAGNOSIS — F0281 Dementia in other diseases classified elsewhere with behavioral disturbance: Secondary | ICD-10-CM | POA: Diagnosis not present

## 2016-10-21 DIAGNOSIS — R05 Cough: Secondary | ICD-10-CM

## 2016-10-21 DIAGNOSIS — G300 Alzheimer's disease with early onset: Secondary | ICD-10-CM

## 2016-10-21 NOTE — Progress Notes (Signed)
Patient ID: Amanda [name], female   DOB: 1945/11/15, 71 y.o.   MRN: [id number]    Nursing Home Location: [facility]   Place of Service: SNF (31)  PCP: Marga MelnickWilliam Hopper, MD  No Known Allergies   Code Status: Full Code  Chief Complaint  Patient presents with  . Medical Management of Chronic Issues    Resident is being seen for routine visit.     HPI:  Patient is a 71 y.o. female [facility] for routine follow up on chronic condition. Pt with a PMH of renal insufficieny, dementia with behaviors, HTN and psychosis. Pt has been having trouble with constipation. Pt was started on linzess which was doing well until she had 2 episodes of incontinence of bowel and husband requested medication to be stopped. Staff and husband both reported she was not having diarrhea.  Pt with advanced dementia which she has had gradual decline and now nonverbal. pts husband request schedule for her bowels but is not going to the bathroom in the commode in the morning per husband.  Staff reports pt with increase cough and congestion today. She did not eat well at breakfast but this is typical per husband.   Review of Systems:  Review of Systems  Unable to perform ROS: Dementia    Past Medical History:  Diagnosis Date  . Dementia    "alzheimer's" (05/18/2012)  . Fall 05/17/2012  . Fracture of medial wall of orbit (HCC) 05/17/2012  . Gout    "? feet" (05/18/2012); uric acid 9.4 on 08/06/16 with R wrist pain; 7.1 on 08/15/16 on Allopurinol  . Hypertension   . Incontinence of urine   . Stroke [facility](HCC) ~ 2003   "slight memory loss" (05/18/2012)  . Wrist fracture, bilateral 05/17/2012   "fell down steps" (05/18/2012)   Past Surgical History:  Procedure Laterality Date  . VAGINAL HYSTERECTOMY     Social History:   reports that she has quit smoking. Her smoking use included Cigarettes. She has never used smokeless tobacco. She reports that she does not drink alcohol or use  drugs.  Family History  Problem Relation Age of Onset  . Dementia Mother   . Dementia Father   . Dementia Sister   . Dementia Brother     Medications: Patient's Medications  New Prescriptions   No medications on file  Previous Medications   ACETAMINOPHEN (TYLENOL) 325 MG TABLET    Take 650 mg by mouth every 6 (six) hours as needed for moderate pain or fever.   ALLOPURINOL (ZYLOPRIM) 100 MG TABLET    Take 150 mg by mouth daily.   AMBULATORY NON FORMULARY MEDICATION    Give 1 magic cup by mouth twice a day for supplement.   AMLODIPINE (NORVASC) 10 MG TABLET    Take 10 mg by mouth daily.   ATORVASTATIN (LIPITOR) 10 MG TABLET    Take 10 mg by mouth daily.   CALCIUM-VITAMIN D PO    Take 500 mg by mouth daily.   CLONIDINE (CATAPRES) 0.1 MG TABLET    Take 0.1 mg by mouth 3 (three) times daily.    DOCUSATE SODIUM (COLACE PO)    Take by mouth 2 (two) times daily.   LABETALOL (NORMODYNE) 300 MG TABLET    Take 300 mg by mouth 2 (two) times daily.   LINACLOTIDE (LINZESS) 72 MCG CAPSULE    Take 72 mcg by mouth every other day.   LORATADINE (CLARITIN) 10 MG TABLET  Take 10 mg by mouth daily.   POLYETHYLENE GLYCOL (MIRALAX / GLYCOLAX) PACKET    Take 17 g by mouth daily.  Modified Medications   No medications on file  Discontinued Medications   ALLOPURINOL (ZYLOPRIM) 100 MG TABLET    Take 100 mg by mouth daily.   AMBULATORY NON FORMULARY MEDICATION    Give 1 Magic cup by mouth twice daily for supplement.     Physical Exam: Vitals:   10/21/16 1348  BP: 131/86  Pulse: 69  Resp: 20  Temp: 98 F (36.7 C)  SpO2: 96%  Weight: 141 lb 3.2 oz (64 kg)  Height: 5\' 1"  (1.549 m)    Physical Exam  Constitutional: She appears well-developed and well-nourished. No distress.  HENT:  Head: Normocephalic and atraumatic.  Grinds teeth  Cardiovascular: Normal rate, regular rhythm and normal heart sounds.   Pulmonary/Chest: Effort normal and breath sounds normal. No respiratory distress. She has no  wheezes.  Abdominal: Soft. Bowel sounds are normal. She exhibits no distension and no mass. There is no tenderness. There is no rebound and no guarding.  Musculoskeletal: She exhibits edema (trace bilaterally). She exhibits no tenderness.  Neurological: She is alert.  Skin: Skin is warm and dry. She is not diaphoretic.  Psychiatric:  Advanced dementia, mumbles      Labs reviewed: Basic Metabolic Panel:  Recent Labs  52/84/13 08/15/16  NA 144 142  K 3.8 3.9  BUN 35* 33*  CREATININE 2.1* 1.6*   Liver Function Tests:  Recent Labs  04/12/16  AST 17  ALT 18  ALKPHOS 82   No results for input(s): LIPASE, AMYLASE in the last 8760 hours. No results for input(s): AMMONIA in the last 8760 hours. CBC:  Recent Labs  04/12/16 08/06/16  WBC 8.4 9.1  HGB 11.8* 13.1  HCT 36 41  PLT 267 283   CBG: No results for input(s): GLUCAP in the last 8760 hours. TSH:  Recent Labs  09/18/16  TSH 1.61   A1C: No results found for: HGBA1C Lipid Panel:  Recent Labs  11/16/15  CHOL 120  HDL 37  LDLCALC 64  TRIG 95   Uric Acid 08/06/16: 9.4  Uric Acid 08/15/16: 7.1  Assessment/Plan 1. Slow transit constipation Linzess was stopped due to episode of incontinence however she was not having diarrhea, husband also was not happy that she was given the medication at 6 am. Agreeable to restart as she is now constipated again but would like this to be given with breakfast. Advised it may cause diarrhea when given with food but will give every other day to reduce side effect. If she is not tolerating with breakfast will give before lunch.  2. Essential hypertension, benign Stable on current regimen.   3. Early onset Alzheimer's disease with behavioral disturbance Progressive disease. Discussed with husband that due to progression of disease she may have episodes of stool and urine incontinence.   4. CKD (chronic kidney disease) stage 3, GFR 30-59 ml/min BUN/CR stable, cont to encourage  proper hydration and avoid nephrotoxic medication.   5. Cough productive of clear sputum Will get chest xray to rule out pneumonia due to increase cough and congestion. Staff to monitor VS q shift. To start dextromethorphan/guaifenesin 10 cc TID while wake x 5 days.   6. GOUT No recurrent flare conts on allopurinol 150 mg daily.  [name]. Biagio Borg  [facility] [phone] 8 am - 5 pm) [phone] (after hours)

## 2016-11-03 ENCOUNTER — Non-Acute Institutional Stay (SKILLED_NURSING_FACILITY): Payer: Medicare Other | Admitting: Internal Medicine

## 2016-11-03 ENCOUNTER — Encounter: Payer: Self-pay | Admitting: Internal Medicine

## 2016-11-03 DIAGNOSIS — B354 Tinea corporis: Secondary | ICD-10-CM

## 2016-11-03 NOTE — Patient Instructions (Signed)
See assessment and plan acutely for this visit ? ?

## 2016-11-03 NOTE — Progress Notes (Signed)
   Facility Location: [facility]  Room Number: 117 A  Code Status: Full Code   This is a nursing facility follow up for specific acute issue of rash.  Interim medical record and care since last [facility] visit was updated with review of diagnostic studies and change in clinical status since last visit were documented.  HPI: Staff noted rash over the hips, left greater than right. By history she's had "ringworm" in the same area which responded to topical antifungal agents. Staff reports no constitutional or extrinsic symptoms. The patient is on loratadine. The patient is unable to provide any history due to her vascular dementia. Vital signs reveal no fever..  Physical exam:  Pertinent or positive findings:The patient is noncommunicative. She sits in a wheelchair slumped to the right. She has contractures of the upper extremities.  Minor rhonchi are noted on chest exam without any wheezing. Abdomen is protuberant. There is a very faint circular rash over the left hip which is difficult to visualize. There is no definite raised border. There is no associated exfoliation, cellulitis, or pustule formation.   General appearance:Adequately nourished; no acute distress , increased work of breathing is present.   Lymphatic: No lymphadenopathy about the head, neck, axilla . Neck:  No thyromegaly, masses, tenderness noted.    Heart:  Normal rate and regular rhythm. S1 and S2 normal without gallop, murmur, click, rub .  Abdomen:Bowel sounds are normal. Abdomen is soft and nontender with no organomegaly, hernias,masses. GU: deferred  Extremities:  No cyanosis, clubbing,edema  Skin: Warm & dry w/o tenting. No significant lesions   #1 mild fungal dermatitis (tinea corporis) See orders: Clotrimazole 1% bid topically

## 2016-11-13 ENCOUNTER — Non-Acute Institutional Stay (SKILLED_NURSING_FACILITY): Payer: Medicare Other | Admitting: Nurse Practitioner

## 2016-11-13 ENCOUNTER — Encounter: Payer: Self-pay | Admitting: Nurse Practitioner

## 2016-11-13 DIAGNOSIS — N183 Chronic kidney disease, stage 3 unspecified: Secondary | ICD-10-CM

## 2016-11-13 DIAGNOSIS — F0281 Dementia in other diseases classified elsewhere with behavioral disturbance: Secondary | ICD-10-CM | POA: Diagnosis not present

## 2016-11-13 DIAGNOSIS — K5909 Other constipation: Secondary | ICD-10-CM | POA: Diagnosis not present

## 2016-11-13 DIAGNOSIS — I10 Essential (primary) hypertension: Secondary | ICD-10-CM

## 2016-11-13 DIAGNOSIS — G300 Alzheimer's disease with early onset: Secondary | ICD-10-CM | POA: Diagnosis not present

## 2016-11-13 DIAGNOSIS — F02818 Dementia in other diseases classified elsewhere, unspecified severity, with other behavioral disturbance: Secondary | ICD-10-CM

## 2016-11-13 NOTE — Progress Notes (Signed)
Patient ID: Amanda [name], female   DOB: 04-May-1946, 71 y.o.   MRN: [id number]    Nursing Home Location: [facility]   Place of Service: SNF (31)  PCP: Marga Melnick, MD  No Known Allergies   Code Status: Full Code  Chief Complaint  Patient presents with  . Medical Management of Chronic Issues    Resident is being seen for routine visit.     HPI:  Patient is a 71 y.o. female seen today at [facility] for routine follow up on chronic condition. Pt with a PMH of renal insufficieny, dementia with behaviors, HTN and psychosis. Staff reports no new problems or complaints. Family has ongoing concerns of pt constipation. Pt currently on Linzess every other day. Nursing staff reports looser stools. Last BM one day ago.  Pt with advanced dementia which she has had gradual decline and now nonverbal. Pt up in wheelchair in room today.  Review of Systems:  Review of Systems  Unable to perform ROS: Dementia    Past Medical History:  Diagnosis Date  . Dementia    "alzheimer's" (05/18/2012)  . Fall 05/17/2012  . Fracture of medial wall of orbit (HCC) 05/17/2012  . Gout    "? feet" (05/18/2012); uric acid 9.4 on 08/06/16 with R wrist pain; 7.1 on 08/15/16 on Allopurinol  . Hypertension   . Incontinence of urine   . Stroke [facility]) ~ 2003   "slight memory loss" (05/18/2012)  . Wrist fracture, bilateral 05/17/2012   "fell down steps" (05/18/2012)   Past Surgical History:  Procedure Laterality Date  . VAGINAL HYSTERECTOMY     Social History:   reports that she has quit smoking. Her smoking use included Cigarettes. She has never used smokeless tobacco. She reports that she does not drink alcohol or use drugs.  Family History  Problem Relation Age of Onset  . Dementia Mother   . Dementia Father   . Dementia Sister   . Dementia Brother     Medications: Patient's Medications  New Prescriptions   No medications on file  Previous Medications   ACETAMINOPHEN (TYLENOL) 325  MG TABLET    Take 650 mg by mouth every 6 (six) hours as needed for moderate pain or fever.   ALLOPURINOL (ZYLOPRIM) 150 MG TABS TABLET    Take 150 mg by mouth daily.   AMBULATORY NON FORMULARY MEDICATION    Give 1 magic cup by mouth twice a day for supplement.   AMLODIPINE (NORVASC) 10 MG TABLET    Take 10 mg by mouth daily.   ATORVASTATIN (LIPITOR) 10 MG TABLET    Take 10 mg by mouth daily.   CALCIUM-VITAMIN D PO    Take 500 mg by mouth daily.   CLONIDINE (CATAPRES) 0.1 MG TABLET    Take 0.1 mg by mouth 3 (three) times daily.    DOCUSATE SODIUM (COLACE PO)    Take by mouth 2 (two) times daily.   LABETALOL (NORMODYNE) 300 MG TABLET    Take 300 mg by mouth 2 (two) times daily.   LINACLOTIDE (LINZESS) 72 MCG CAPSULE    Take 72 mcg by mouth every other day.   LORATADINE (CLARITIN) 10 MG TABLET    Take 10 mg by mouth daily.   POLYETHYLENE GLYCOL (MIRALAX / GLYCOLAX) PACKET    Take 17 g by mouth daily.  Modified Medications   No medications on file  Discontinued Medications   No medications on file     Physical Exam: Vitals:  11/13/16 1111  BP: (!) 141/69  Pulse: 64  Resp: 18  Temp: 97.7 F (36.5 C)  SpO2: 96%  Weight: 141 lb 3.2 oz (64 kg)  Height:  (1.549 m)    Physical Exam  Constitutional: She appears well-developed and well-nourished. She appears lethargic. No distress.  HENT:  Head: Normocephalic and atraumatic.  Mouth/Throat: Oropharynx is clear and moist.  Grinds teeth  Eyes: Pupils are equal, round, and reactive to light. Right eye exhibits no discharge. Left eye exhibits no discharge.  Cardiovascular: Normal rate, regular rhythm and normal heart sounds.   Pulmonary/Chest: Effort normal and breath sounds normal. No respiratory distress. She has no wheezes.  Abdominal: Soft. Bowel sounds are normal. She exhibits no distension. There is no tenderness. There is no guarding.  Musculoskeletal: She exhibits edema (trace bilaterally). She exhibits no tenderness.    Neurological: She appears lethargic.  Skin: Skin is warm and dry. She is not diaphoretic.  Psychiatric:  Advanced dementia, nonverbal. Pt unable to open eyes today to loud voice and tactile stimulation.     Labs reviewed: Basic Metabolic Panel:  Recent Labs  16/10/96 08/15/16  NA 144 142  K 3.8 3.9  BUN 35* 33*  CREATININE 2.1* 1.6*   Liver Function Tests:  Recent Labs  04/12/16  AST 17  ALT 18  ALKPHOS 82   No results for input(s): LIPASE, AMYLASE in the last 8760 hours. No results for input(s): AMMONIA in the last 8760 hours. CBC:  Recent Labs  04/12/16 08/06/16  WBC 8.4 9.1  HGB 11.8* 13.1  HCT 36 41  PLT 267 283   CBG: No results for input(s): GLUCAP in the last 8760 hours. TSH:  Recent Labs  09/18/16  TSH 1.61   A1C: No results found for: HGBA1C Lipid Panel:  Recent Labs  11/16/15  CHOL 120  HDL 37  LDLCALC 64  TRIG 95   Uric Acid 08/06/16: 9.4  Uric Acid 08/15/16: 7.1  Assessment/Plan  1. CKD (chronic kidney disease) stage 3, GFR 30-59 ml/min -BUN/CR stable, cont to encourage proper hydration and avoid nephrotoxic medication.   2. Early onset Alzheimer's disease with behavioral disturbance -Progressive disease, has had gradual decline over the last few months.   3. Essential hypertension, benign -Stable on current regimen of norvasc, clonidine, and labetalol  4. Chronic constipation -Continue current regimen of linzess PO every other day  before first meal of the day. -Miralax as needed  -Encourage adequate fluid intake  -Staff and family to monitor for constipation and/or diarrhea  Alexis Mizuno K. Biagio Borg  [facility] [phone] 8 am - 5 pm) [phone] (after hours)

## 2016-11-24 ENCOUNTER — Non-Acute Institutional Stay (SKILLED_NURSING_FACILITY): Payer: Medicare Other | Admitting: Internal Medicine

## 2016-11-24 ENCOUNTER — Encounter: Payer: Self-pay | Admitting: Internal Medicine

## 2016-11-24 DIAGNOSIS — R220 Localized swelling, mass and lump, head: Secondary | ICD-10-CM

## 2016-11-24 NOTE — Progress Notes (Signed)
    Facility Location: [facility]  Room Number: 117-A   Code Status: Full Code   This is a nursing facility follow up for specific acute issue of swelling of the lips.  Interim medical record and care since last [facility] visit was updated with review of diagnostic studies and change in clinical status since last visit were documented.  HPI: A change in condition form reported swelling of the lip with some redness and warmth. Chart review indicates the patient is not on an ACE inhibitor or an ARB. There was no report of upper respiratory tract infection symptoms or other extrinsic symptoms. The patient has no known themedicinal allergies. There is no reported change in her diet or medications. The patient is on Claritin, she has a history of allergic rhinitis.  Review of systems: The patient is non verbal; no interaction except resistance to exam. As per SNF Staff: Constitutional: No fever,significant weight change  Eyes: No redness, discharge, pain, vision change ENT/mouth: No nasal congestion,  purulent discharge, earache  Cardiovascular: No paroxysmal nocturnal dyspnea,progressive edema  Respiratory: No cough, sputum production,hemoptysis, significant snoring,apnea   Allergy/immunology: No itchy/ watery eyes, significant sneezing  Physical exam:  Pertinent or positive findings: As noted there is no interaction. She has pattern alopecia and thinning of hair. The upper lip is swollen. It is not red or hot. She resists examination of the eyes and mouth. Left TM is normal. The right could not be visualized adequately due to cervical muscular spasm with rotation of head to the right. She has minor rales and rhonchi without wheezes. She has trace edema. Pedal pulses are decreased. With stimulus of the lower extremities the feet are upgoing. She has the upper extremities flexed across her chest. General appearance:Adequately nourished; no acute distress  , increased work of breathing is present.   Lymphatic: No lymphadenopathy about the head, neck, axilla . Ears:  External ear exam shows no significant lesions or deformities.   Nose:  External nasal examination shows no deformity or inflammation. Nasal mucosa are pink and moist without lesions ,exudates Neck:  No thyromegaly, masses, tenderness noted.    Heart:  Normal rate and regular rhythm. S1 and S2 normal without gallop, murmur, click, rub .  Abdomen:Bowel sounds are normal. Abdomen is soft and nontender with no organomegaly, hernias,masses. GU: deferred  Extremities:  No cyanosis, clubbing  Skin: Warm & dry w/o tenting. No significant lesions or rash.  #1 swelling of the upper lip suggesting urticarial variant. There is no history to suggest associated sinusitis or other extrinsic symptoms. Singulair and low-dose prednisone will be initiated.

## 2016-11-24 NOTE — Patient Instructions (Signed)
See assessment and plan acutely for this visit ? ?

## 2016-12-08 ENCOUNTER — Encounter: Payer: Self-pay | Admitting: Internal Medicine

## 2016-12-08 ENCOUNTER — Non-Acute Institutional Stay (SKILLED_NURSING_FACILITY): Payer: Medicare Other | Admitting: Internal Medicine

## 2016-12-08 DIAGNOSIS — K1379 Other lesions of oral mucosa: Secondary | ICD-10-CM

## 2016-12-08 DIAGNOSIS — S025XXG Fracture of tooth (traumatic), subsequent encounter for fracture with delayed healing: Secondary | ICD-10-CM

## 2016-12-08 NOTE — Patient Instructions (Signed)
See assessment and plan under each diagnosis acutely for this visit  

## 2016-12-08 NOTE — Progress Notes (Signed)
    Facility Location: [facility] Room Number: 117 A  Code Status: Full  PCP: Marga Melnick, MD [address] [place] [address]  This is a nursing facility follow up for specific acute issue of oral bleeding.  Interim medical record and care since last [facility] visit was updated with review of diagnostic studies and change in clinical status since last visit were documented.  HPI: Staff reported bleeding from the mouth. No other bleeding dyscrasias were reported the patient is on no anticoagulants. Past history is negative for any bleeding dyscrasias.  The patient was seen last week for swelling of the lips, mainly the left upper lip. Staff reports the patient is constantly grinding her teeth and has sustained a fracture of one of the left upper incisors. By history the visiting dentist  did attempt to  treat this tooth but could not sedate the patient even with 2 mg of Ativan.  Review of systems: Dementia is associated with a nonverbal, noncommunicative state, but resistance to exam.  Physical exam:  Pertinent or positive findings: Pattern alopecia present.Patient resists exam. The second left incisor of the maxilla is broken and fang like in appearance. I can appreciate no instability to manipulation. There is evidence of trauma to the bucchal mucosa on the left. The left upper lip is also swollen. No lymphadenopathy was palpable in cervical areas. Axilla could not be examined as she holds her arms across her chest flexed tightly. Initially she was asleep and exhibited some snoring type respirations but this resolved when she became agitated during the exam. Heart sounds were distant. Abdomen was nontender. General appearance:Adequately nourished; no acute distress , increased work of breathing is present.   Ears:  External ear exam shows no significant lesions or deformities.   Nose:  External nasal examination shows no deformity or inflammation.  Nasal mucosa are pink and moist without lesions ,exudates Neck:  No thyromegaly, masses, tenderness noted.    Heart:  Normal rate and regular rhythm. S1 and S2 normal without gallop, murmur, click, rub .  Abdomen:Bowel sounds are normal.  Skin: Warm & dry w/o tenting. No significant lesions or rash.  #1 broken left maxillary incisor with resultant fang-like appearance. The tooth was probably broken during her repetitive grinding of teeth. There is probably associated dental instability due to senile osteoporosis. #2 lip swelling from trauma and oral bleeding from buccal mucosa trauma Plan: Oral surgery consult. CBC, PT/PTT would be performed as prelude to oral surgery.

## 2016-12-09 LAB — CBC AND DIFFERENTIAL
HCT: 37 % (ref 36–46)
Hemoglobin: 11.9 g/dL — AB (ref 12.0–16.0)
Platelets: 290 10*3/uL (ref 150–399)
WBC: 8.9 10*3/mL

## 2016-12-09 LAB — PROTIME-INR: PROTIME: 13.9 s — AB (ref 10.0–13.8)

## 2016-12-09 LAB — POCT INR: INR: 1.1 (ref 0.9–1.1)

## 2016-12-10 ENCOUNTER — Encounter: Payer: Self-pay | Admitting: *Deleted

## 2016-12-16 ENCOUNTER — Encounter: Payer: Self-pay | Admitting: Nurse Practitioner

## 2016-12-16 ENCOUNTER — Non-Acute Institutional Stay (SKILLED_NURSING_FACILITY): Payer: Medicare Other | Admitting: Nurse Practitioner

## 2016-12-16 DIAGNOSIS — S025XXG Fracture of tooth (traumatic), subsequent encounter for fracture with delayed healing: Secondary | ICD-10-CM | POA: Diagnosis not present

## 2016-12-16 DIAGNOSIS — G300 Alzheimer's disease with early onset: Secondary | ICD-10-CM

## 2016-12-16 DIAGNOSIS — I10 Essential (primary) hypertension: Secondary | ICD-10-CM

## 2016-12-16 DIAGNOSIS — N183 Chronic kidney disease, stage 3 unspecified: Secondary | ICD-10-CM

## 2016-12-16 DIAGNOSIS — E782 Mixed hyperlipidemia: Secondary | ICD-10-CM | POA: Diagnosis not present

## 2016-12-16 DIAGNOSIS — I959 Hypotension, unspecified: Secondary | ICD-10-CM | POA: Diagnosis not present

## 2016-12-16 DIAGNOSIS — F0281 Dementia in other diseases classified elsewhere with behavioral disturbance: Secondary | ICD-10-CM

## 2016-12-16 DIAGNOSIS — K5909 Other constipation: Secondary | ICD-10-CM

## 2016-12-16 NOTE — Progress Notes (Signed)
Patient ID: Amanda [name], female   DOB: Dec 31, 1945, 71 y.o.   MRN: [id number]    Nursing Home Location: [facility]   Place of Service: SNF (31)  PCP: Pecola LawlessHopper, William F, MD  No Known Allergies   Code Status: Full Code  Chief Complaint  Patient presents with  . Medical Management of Chronic Issues    Resident is being seen for a routine visit.    HPI:  Patient is a 71 y.o. female seen today at [facility] for routine follow up on chronic condition. Pt with a PMH of renal insufficieny, dementia with behaviors, HTN and psychosis. Pt had a fracture tooth due to grinding in the last month and has dental appt schedule, conts to have minimal bleeding periodically noted by staff. Staff notes bowel regimen has been effective. No increase in pain noted.  Slow decline noted as dementia progresses.   Review of Systems:  Review of Systems  Unable to perform ROS: Dementia    Past Medical History:  Diagnosis Date  . Dementia    "alzheimer's" (05/18/2012)  . Fall 05/17/2012  . Fracture of medial wall of orbit (HCC) 05/17/2012  . Gout    "? feet" (05/18/2012); uric acid 9.4 on 08/06/16 with R wrist pain; 7.1 on 08/15/16 on Allopurinol  . Hypertension   . Incontinence of urine   . Stroke [facility](HCC) ~ 2003   "slight memory loss" (05/18/2012)  . Wrist fracture, bilateral 05/17/2012   "fell down steps" (05/18/2012)   Past Surgical History:  Procedure Laterality Date  . VAGINAL HYSTERECTOMY     Social History:   reports that she has quit smoking. Her smoking use included Cigarettes. She has never used smokeless tobacco. She reports that she does not drink alcohol or use drugs.  Family History  Problem Relation Age of Onset  . Dementia Mother   . Dementia Father   . Dementia Sister   . Dementia Brother     Medications: Patient's Medications  New Prescriptions   No medications on file  Previous Medications   ACETAMINOPHEN (TYLENOL) 325 MG TABLET    Take 650 mg by mouth  every 6 (six) hours as needed for moderate pain or fever.   ALLOPURINOL (ZYLOPRIM) 150 MG TABS TABLET    Take 150 mg by mouth daily.   AMBULATORY NON FORMULARY MEDICATION    Give 1 magic cup by mouth twice a day for supplement.   AMLODIPINE (NORVASC) 10 MG TABLET    Take 10 mg by mouth daily.   ATORVASTATIN (LIPITOR) 10 MG TABLET    Take 10 mg by mouth daily.   CALCIUM-VITAMIN D PO    Take 500 mg by mouth daily.   CLONIDINE (CATAPRES) 0.1 MG TABLET    Take 0.1 mg by mouth 3 (three) times daily.    DOCUSATE SODIUM (COLACE PO)    Take by mouth 2 (two) times daily.   LABETALOL (NORMODYNE) 300 MG TABLET    Take 300 mg by mouth 2 (two) times daily.   LINACLOTIDE (LINZESS) 72 MCG CAPSULE    Take 72 mcg by mouth every other day.   LORATADINE (CLARITIN) 10 MG TABLET    Take 10 mg by mouth daily as needed for allergies.    LORAZEPAM (ATIVAN) 2 MG TABLET    Take 2 mg by mouth as directed. As directed prior dental appointment.   POLYETHYLENE GLYCOL (MIRALAX / GLYCOLAX) PACKET    Take 17 g by mouth daily as needed.  Modified Medications   No medications on file  Discontinued Medications   No medications on file     Physical Exam: Vitals:   12/16/16 1136  BP: (!) 83/51  Pulse: 75  Resp: 16  Temp: 97.2 F (36.2 C)  SpO2: 98%  Weight: 139 lb 12.8 oz (63.4 kg)  Height: 5\' 1"  (1.549 m)    Physical Exam  Constitutional: She appears well-developed and well-nourished. She appears lethargic. No distress.  HENT:  Head: Normocephalic and atraumatic.  Mouth/Throat: Oropharynx is clear and moist.  Grinds teeth  Eyes: Pupils are equal, round, and reactive to light. Right eye exhibits no discharge. Left eye exhibits no discharge.  Cardiovascular: Normal rate, regular rhythm and normal heart sounds.   Pulmonary/Chest: Effort normal and breath sounds normal. No respiratory distress. She has no wheezes.  Abdominal: Soft. Bowel sounds are normal. She exhibits no distension. There is no tenderness. There  is no guarding.  Musculoskeletal: She exhibits edema (trace bilaterally). She exhibits no tenderness.  Neurological: She appears lethargic.  Skin: Skin is warm and dry. She is not diaphoretic.  Psychiatric:  Advanced dementia, nonverbal. Pt gets agitated on exam.     Labs reviewed: Basic Metabolic Panel:  Recent Labs  16/10/96 08/15/16  NA 144 142  K 3.8 3.9  BUN 35* 33*  CREATININE 2.1* 1.6*   Liver Function Tests:  Recent Labs  04/12/16  AST 17  ALT 18  ALKPHOS 82   No results for input(s): LIPASE, AMYLASE in the last 8760 hours. No results for input(s): AMMONIA in the last 8760 hours. CBC:  Recent Labs  04/12/16 08/06/16 12/09/16  WBC 8.4 9.1 8.9  HGB 11.8* 13.1 11.9*  HCT 36 41 37  PLT 267 283 290   CBG: No results for input(s): GLUCAP in the last 8760 hours. TSH:  Recent Labs  09/18/16  TSH 1.61   A1C: No results found for: HGBA1C Lipid Panel: No results for input(s): CHOL, HDL, LDLCALC, TRIG, CHOLHDL, LDLDIRECT in the last 8760 hours. Uric Acid 08/06/16: 9.4  Uric Acid 08/15/16: 7.1  Assessment/Plan 1. Open fracture of tooth with delayed healing, subsequent encounter Pt noted to have oral bleeding and fount to have tooth fracture, pt grinds teeth continually; slight bleeding noted at times, Dentist appt has been scheduled.   2. CKD (chronic kidney disease) stage 3, GFR 30-59 ml/min Will follow up BMP  3. Early onset Alzheimer's disease with behavioral disturbance Slow progression of disease progress, increase lethargy noted and slow weight loss. Pt down 2 lbs in the last month (could also be due to broken tooth) but 9 lbs in last 6 months. Off all dementia related medications as they would not provide benefit at this time.   4. Essential hypertension, benign Now with hypotension, see number 7  5. Chronic constipation Stable on linzess and colace  6. Mixed hyperlipidemia On Lipitor, will follow up fasting lipids.   7. Hypotension -will dc  Norvasc and decrease clonidine to 0.1 mg BID -staff to take VS q shift -will get BMP -to encourage hydration.   [name]. Biagio Borg  [facility] [phone] 8 am - 5 pm) [phone] (after hours)

## 2016-12-17 LAB — BASIC METABOLIC PANEL
BUN: 18 mg/dL (ref 4–21)
Creatinine: 1.5 mg/dL — AB (ref 0.5–1.1)
Glucose: 94 mg/dL
Potassium: 4 mmol/L (ref 3.4–5.3)
Sodium: 143 mmol/L (ref 137–147)

## 2016-12-17 LAB — LIPID PANEL
CHOLESTEROL: 126 mg/dL (ref 0–200)
HDL: 35 mg/dL (ref 35–70)
LDL Cholesterol: 69 mg/dL
Triglycerides: 109 mg/dL (ref 40–160)

## 2017-01-07 ENCOUNTER — Encounter: Payer: Self-pay | Admitting: Internal Medicine

## 2017-01-08 DIAGNOSIS — M24521 Contracture, right elbow: Secondary | ICD-10-CM | POA: Diagnosis not present

## 2017-01-08 DIAGNOSIS — M24522 Contracture, left elbow: Secondary | ICD-10-CM | POA: Diagnosis not present

## 2017-01-08 DIAGNOSIS — N189 Chronic kidney disease, unspecified: Secondary | ICD-10-CM | POA: Diagnosis not present

## 2017-01-11 DIAGNOSIS — N189 Chronic kidney disease, unspecified: Secondary | ICD-10-CM | POA: Diagnosis not present

## 2017-01-11 DIAGNOSIS — M24521 Contracture, right elbow: Secondary | ICD-10-CM | POA: Diagnosis not present

## 2017-01-11 DIAGNOSIS — M24522 Contracture, left elbow: Secondary | ICD-10-CM | POA: Diagnosis not present

## 2017-01-12 DIAGNOSIS — M24521 Contracture, right elbow: Secondary | ICD-10-CM | POA: Diagnosis not present

## 2017-01-12 DIAGNOSIS — N189 Chronic kidney disease, unspecified: Secondary | ICD-10-CM | POA: Diagnosis not present

## 2017-01-12 DIAGNOSIS — M24522 Contracture, left elbow: Secondary | ICD-10-CM | POA: Diagnosis not present

## 2017-01-13 ENCOUNTER — Encounter: Payer: Self-pay | Admitting: Nurse Practitioner

## 2017-01-13 ENCOUNTER — Non-Acute Institutional Stay (SKILLED_NURSING_FACILITY): Payer: Medicare Other | Admitting: Nurse Practitioner

## 2017-01-13 DIAGNOSIS — G300 Alzheimer's disease with early onset: Secondary | ICD-10-CM

## 2017-01-13 DIAGNOSIS — K5909 Other constipation: Secondary | ICD-10-CM

## 2017-01-13 DIAGNOSIS — N189 Chronic kidney disease, unspecified: Secondary | ICD-10-CM | POA: Diagnosis not present

## 2017-01-13 DIAGNOSIS — D638 Anemia in other chronic diseases classified elsewhere: Secondary | ICD-10-CM | POA: Diagnosis not present

## 2017-01-13 DIAGNOSIS — E782 Mixed hyperlipidemia: Secondary | ICD-10-CM

## 2017-01-13 DIAGNOSIS — I10 Essential (primary) hypertension: Secondary | ICD-10-CM | POA: Diagnosis not present

## 2017-01-13 DIAGNOSIS — N183 Chronic kidney disease, stage 3 unspecified: Secondary | ICD-10-CM

## 2017-01-13 DIAGNOSIS — F0281 Dementia in other diseases classified elsewhere with behavioral disturbance: Secondary | ICD-10-CM

## 2017-01-13 DIAGNOSIS — M24522 Contracture, left elbow: Secondary | ICD-10-CM | POA: Diagnosis not present

## 2017-01-13 DIAGNOSIS — M24521 Contracture, right elbow: Secondary | ICD-10-CM | POA: Diagnosis not present

## 2017-01-13 DIAGNOSIS — F02818 Dementia in other diseases classified elsewhere, unspecified severity, with other behavioral disturbance: Secondary | ICD-10-CM

## 2017-01-13 NOTE — Progress Notes (Signed)
Patient ID: Amanda [name], female   DOB: 02/20/1946, 71 y.o.   MRN: [id number]    Nursing Home Location: [facility] Room: 117 A   Place of Service: SNF (31)  PCP: Pecola LawlessHopper, William F, MD  No Known Allergies   Code Status: Full Code  Chief Complaint  Patient presents with  . Medical Management of Chronic Issues    Resident is being seen for a routine visit.     HPI:  Patient is a 71 y.o. female seen today at [facility] for routine follow up on chronic condition. Pt with a PMH of renal insufficieny, dementia with behaviors, HTN and psychosis. Pt had dental work in the last month- had tooth exacted due to being broken. Staff reports there are no concerns at this time.  There has been no acute changes in cognitive status but overall decline noted over the last few months. Does not contribute to HPI or ROS.  Sleeps through exam and gets agitated when asked to follow commands.  Review of Systems:  Review of Systems  Unable to perform ROS: Dementia    Past Medical History:  Diagnosis Date  . Dementia    "alzheimer's" (05/18/2012)  . Fall 05/17/2012  . Fracture of medial wall of orbit (HCC) 05/17/2012  . Gout    "? feet" (05/18/2012); uric acid 9.4 on 08/06/16 with R wrist pain; 7.1 on 08/15/16 on Allopurinol  . Hypertension   . Incontinence of urine   . Stroke [facility](HCC) ~ 2003   "slight memory loss" (05/18/2012)  . Wrist fracture, bilateral 05/17/2012   "fell down steps" (05/18/2012)   Past Surgical History:  Procedure Laterality Date  . fractured tooth     01/06/17 Dr Ocie DoyneScott Jensen DMD extracted tooth  #4   . VAGINAL HYSTERECTOMY     Social History:   reports that she has quit smoking. Her smoking use included Cigarettes. She has never used smokeless tobacco. She reports that she does not drink alcohol or use drugs.  Family History  Problem Relation Age of Onset  . Dementia Mother   . Dementia Father   . Dementia Sister   . Dementia Brother      Medications: Patient's Medications  New Prescriptions   No medications on file  Previous Medications   ACETAMINOPHEN (TYLENOL) 325 MG TABLET    Take 650 mg by mouth every 6 (six) hours as needed for moderate pain or fever.   ALLOPURINOL (ZYLOPRIM) 150 MG TABS TABLET    Take 150 mg by mouth daily.   AMBULATORY NON FORMULARY MEDICATION    Give 1 magic cup by mouth twice a day for supplement.   ATORVASTATIN (LIPITOR) 10 MG TABLET    Take 10 mg by mouth daily.   CALCIUM-VITAMIN D PO    Take 500 mg by mouth daily.   CLONIDINE (CATAPRES) 0.1 MG TABLET    Take 0.1 mg by mouth 2 (two) times daily. Hold for sbp <120   DOCUSATE SODIUM (COLACE PO)    Take by mouth 2 (two) times daily.   LABETALOL (NORMODYNE) 300 MG TABLET    Take 300 mg by mouth 2 (two) times daily.   LINACLOTIDE (LINZESS) 72 MCG CAPSULE    Take 72 mcg by mouth every other day.   LORATADINE (CLARITIN) 10 MG TABLET    Take 10 mg by mouth daily as needed for allergies.    LORAZEPAM (ATIVAN) 2 MG TABLET    Take 2 mg by mouth as directed. As  directed prior dental appointment.   POLYETHYLENE GLYCOL (MIRALAX / GLYCOLAX) PACKET    Take 17 g by mouth daily as needed.   Modified Medications   No medications on file  Discontinued Medications   AMLODIPINE (NORVASC) 10 MG TABLET    Take 10 mg by mouth daily.   CLONIDINE (CATAPRES) 0.1 MG TABLET    Take 0.1 mg by mouth 3 (three) times daily.      Physical Exam: Vitals:   01/13/17 1018  BP: 107/62  Pulse: 63  Resp: 19  Temp: 97.4 F (36.3 C)  SpO2: 96%  Weight: 139 lb (63 kg)  Height: 5\' 1"  (1.549 m)    Physical Exam  Constitutional: She appears well-developed and well-nourished. She appears lethargic. No distress.  HENT:  Head: Normocephalic and atraumatic.  Mouth/Throat: Oropharynx is clear and moist.  Grinds teeth  Eyes: Pupils are equal, round, and reactive to light. Right eye exhibits no discharge. Left eye exhibits no discharge.  Cardiovascular: Normal rate, regular  rhythm and normal heart sounds.   Pulmonary/Chest: Effort normal and breath sounds normal. No respiratory distress. She has no wheezes.  Abdominal: Soft. Bowel sounds are normal. She exhibits no distension. There is no tenderness. There is no guarding.  Musculoskeletal: She exhibits edema (trace bilaterally). She exhibits no tenderness.  Neurological: She appears lethargic.  Skin: Skin is warm and dry. She is not diaphoretic.  Psychiatric:  Advanced dementia, nonverbal. Pt gets agitated on exam.     Labs reviewed: Basic Metabolic Panel:  Recent Labs  16/10/96 08/15/16 12/17/16  NA 144 142 143  K 3.8 3.9 4.0  BUN 35* 33* 18  CREATININE 2.1* 1.6* 1.5*   Liver Function Tests:  Recent Labs  04/12/16  AST 17  ALT 18  ALKPHOS 82   No results for input(s): LIPASE, AMYLASE in the last 8760 hours. No results for input(s): AMMONIA in the last 8760 hours. CBC:  Recent Labs  04/12/16 08/06/16 12/09/16  WBC 8.4 9.1 8.9  HGB 11.8* 13.1 11.9*  HCT 36 41 37  PLT 267 283 290   CBG: No results for input(s): GLUCAP in the last 8760 hours. TSH:  Recent Labs  09/18/16  TSH 1.61   A1C: No results found for: HGBA1C Lipid Panel:  Recent Labs  12/17/16  CHOL 126  HDL 35  LDLCALC 69  TRIG 109   Uric Acid 08/06/16: 9.4  Uric Acid 08/15/16: 7.1  Assessment/Plan 1. Mixed hyperlipidemia LDL at goal. conts on Lipitor daily.   2. Anemia of chronic disease hgb down from previous value, no signs of bleeding. Will cont to monitor at this time  3. CKD (chronic kidney disease) stage 3, GFR 30-59 ml/min Stable on recent labs, to avoid dehydration and nephrotoxic medications  4. Early onset Alzheimer's disease with behavioral disturbance Stable, without acute changes but slow gradual decline noted. 9 lb weight loss noted in the last 6 months. anticipate further decline as disease progresses.   5. Chronic constipation Stable will cont current regimen.  6. Essential hypertension,  benign Stable. Will cont labetalol and clonidine   Keylon Labelle K. Biagio Borg  [facility] [phone] 8 am - 5 pm) [phone] (after hours)

## 2017-02-08 DIAGNOSIS — L988 Other specified disorders of the skin and subcutaneous tissue: Secondary | ICD-10-CM | POA: Diagnosis not present

## 2017-02-12 ENCOUNTER — Non-Acute Institutional Stay (SKILLED_NURSING_FACILITY): Payer: Medicare Other | Admitting: Adult Health

## 2017-02-12 ENCOUNTER — Encounter: Payer: Self-pay | Admitting: Adult Health

## 2017-02-12 DIAGNOSIS — K5909 Other constipation: Secondary | ICD-10-CM

## 2017-02-12 DIAGNOSIS — F0280 Dementia in other diseases classified elsewhere without behavioral disturbance: Secondary | ICD-10-CM

## 2017-02-12 DIAGNOSIS — I10 Essential (primary) hypertension: Secondary | ICD-10-CM | POA: Diagnosis not present

## 2017-02-12 DIAGNOSIS — Z8739 Personal history of other diseases of the musculoskeletal system and connective tissue: Secondary | ICD-10-CM

## 2017-02-12 DIAGNOSIS — N183 Chronic kidney disease, stage 3 unspecified: Secondary | ICD-10-CM

## 2017-02-12 DIAGNOSIS — G309 Alzheimer's disease, unspecified: Secondary | ICD-10-CM | POA: Diagnosis not present

## 2017-02-12 DIAGNOSIS — E782 Mixed hyperlipidemia: Secondary | ICD-10-CM

## 2017-02-12 NOTE — Progress Notes (Signed)
DATE:  02/12/2017   MRN:  [id number]  BIRTHDAY: 01-18-1946  Facility:  Nursing Home Location:  [facility] Room Number: 117-A  LEVEL OF CARE:  SNF [phone](31)  Contact Information    Name Relation Home Work [place]   Nims,[name] Spouse [phone]  [phone]   Towanda MalkinWilliamson,[name] J Son   [phone]   Buena IrishWilliamson,Chris Son   [phone]       Code Status History    Date Active Date Inactive Code Status Order ID Comments User Context   05/17/2012  6:17 PM 05/21/2012  5:48 PM Full Code [id number]  Jefferey Picaripp, Heather S, RN Inpatient       Chief Complaint  Patient presents with  . Medical Management of Chronic Issues    Routine visit    HISTORY OF PRESENT ILLNESS:  This is a 71-YO female seen for a routine visit.  She is a long-term care resident of [facility]. She was seen in the room today. She was nonverbal. She opens her eyes to verbal stimulation. She did not follow simple commands. She has PMH of renal insufficiency, dementia, hypertension and psychosis.    PAST MEDICAL HISTORY:  Past Medical History:  Diagnosis Date  . Dementia    "alzheimer's" (05/18/2012)  . Fall 05/17/2012  . Fracture of medial wall of orbit (HCC) 05/17/2012  . Gout    "? feet" (05/18/2012); uric acid 9.4 on 08/06/16 with R wrist pain; 7.1 on 08/15/16 on Allopurinol  . Hypertension   . Incontinence of urine   . Stroke [facility](HCC) ~ 2003   "slight memory loss" (05/18/2012)  . Wrist fracture, bilateral 05/17/2012   "fell down steps" (05/18/2012)     CURRENT MEDICATIONS: Reviewed  Patient's Medications  New Prescriptions   No medications on file  Previous Medications   ACETAMINOPHEN (TYLENOL) 325 MG TABLET    Take 650 mg by mouth every 6 (six) hours as needed for moderate pain or fever.   ALLOPURINOL (ZYLOPRIM) 150 MG TABS TABLET    Take 150 mg by mouth daily.   AMBULATORY NON FORMULARY MEDICATION    Give 1 magic cup by mouth twice a day for  supplement.   ATORVASTATIN (LIPITOR) 10 MG TABLET    Take 10 mg by mouth daily.   CALCIUM-VITAMIN D PO    Take 500 mg by mouth daily.   CLONIDINE (CATAPRES) 0.1 MG TABLET    Take 0.1 mg by mouth 2 (two) times daily. Hold for sbp <120   DOCUSATE SODIUM (COLACE PO)    Take by mouth 2 (two) times daily.   LABETALOL (NORMODYNE) 300 MG TABLET    Take 300 mg by mouth 2 (two) times daily.   LINACLOTIDE (LINZESS) 72 MCG CAPSULE    Take 72 mcg by mouth every other day.   LORATADINE (CLARITIN) 10 MG TABLET    Take 10 mg by mouth daily as needed for allergies.    POLYETHYLENE GLYCOL (MIRALAX / GLYCOLAX) PACKET    Take 17 g by mouth daily as needed.   Modified Medications   No medications on file  Discontinued Medications   LORAZEPAM (ATIVAN) 2 MG TABLET    Take 2 mg by mouth as directed. As directed prior dental appointment.     No Known Allergies   REVIEW OF SYSTEMS:  Unable to obtain due to dementia   PHYSICAL EXAMINATION  GENERAL APPEARANCE:  In no acute distress. Normal body habitus SKIN:  Skin is warm and dry.  HEAD:  Normal in size and contour. No evidence of trauma EYES: Lids open and close normally. No blepharitis, entropion or ectropion. EARS: Pinnae are normal.  MOUTH and THROAT: Lips are without lesions.  RESPIRATORY: breathing is even & unlabored, BS CTAB CARDIAC: RRR, no murmur,no extra heart sounds, no edema GI: abdomen soft, normal BS, no masses, no tenderness, no hepatomegaly, no splenomegaly EXTREMITIES:  No edema, has splint on bilateral arms PSYCHIATRIC:  Affect and behavior are appropriate   LABS/RADIOLOGY: Labs reviewed: Basic Metabolic Panel:  Recent Labs  91/47/82 08/15/16 12/17/16  NA 144 142 143  K 3.8 3.9 4.0  BUN 35* 33* 18  CREATININE 2.1* 1.6* 1.5*   Liver Function Tests:  Recent Labs  04/12/16  AST 17  ALT 18  ALKPHOS 82   CBC:  Recent Labs  04/12/16 08/06/16 12/09/16  WBC 8.4 9.1 8.9  HGB 11.8* 13.1 11.9*  HCT 36 41 37  PLT 267 283 290    Lipid Panel:  Recent Labs  12/17/16  HDL 35    ASSESSMENT/PLAN:  1. Essential hypertension, benign -  Well-controlled; continue clonidine 0.1 mg 1 tab by mouth twice a day and labetalol 300 mg 1 tab by mouth twice a day   2. CKD (chronic kidney disease) stage 3, GFR 30-59 ml/min -  stable Lab Results  Component Value Date   CREATININE 1.5 (A) 12/17/2016    3. History of gout - continue allopurinol 100 mg give 1 1/2 tab = 150 mg by mouth daily   4. Chronic constipation - continue docusate 100 mg 1 capsule by mouth twice a day, Linzess 72 g 1 capsule by mouth every other day and Miralax PRN   5. Mixed hyperlipidemia - continue Lipitor 10 mg 1 tab by mouth daily Lab Results  Component Value Date   CHOL 126 12/17/2016   HDL 35 12/17/2016   LDLCALC 69 12/17/2016   TRIG 109 12/17/2016    6. Alzheimer's dementia - continue supportive care; fall precautions     Goals of care:  Long-term care   Monina C. [name] - NP    [employer] [phone]

## 2017-02-26 DIAGNOSIS — M79674 Pain in right toe(s): Secondary | ICD-10-CM | POA: Diagnosis not present

## 2017-02-26 DIAGNOSIS — I739 Peripheral vascular disease, unspecified: Secondary | ICD-10-CM | POA: Diagnosis not present

## 2017-02-26 DIAGNOSIS — M79675 Pain in left toe(s): Secondary | ICD-10-CM | POA: Diagnosis not present

## 2017-02-26 DIAGNOSIS — B351 Tinea unguium: Secondary | ICD-10-CM | POA: Diagnosis not present

## 2017-02-26 DIAGNOSIS — L603 Nail dystrophy: Secondary | ICD-10-CM | POA: Diagnosis not present

## 2017-03-10 ENCOUNTER — Non-Acute Institutional Stay (SKILLED_NURSING_FACILITY): Payer: Medicare Other | Admitting: Adult Health

## 2017-03-10 ENCOUNTER — Encounter: Payer: Self-pay | Admitting: Adult Health

## 2017-03-10 DIAGNOSIS — Z8739 Personal history of other diseases of the musculoskeletal system and connective tissue: Secondary | ICD-10-CM

## 2017-03-10 DIAGNOSIS — H00015 Hordeolum externum left lower eyelid: Secondary | ICD-10-CM | POA: Diagnosis not present

## 2017-03-10 DIAGNOSIS — I10 Essential (primary) hypertension: Secondary | ICD-10-CM

## 2017-03-10 DIAGNOSIS — K5909 Other constipation: Secondary | ICD-10-CM | POA: Diagnosis not present

## 2017-03-10 DIAGNOSIS — E782 Mixed hyperlipidemia: Secondary | ICD-10-CM

## 2017-03-10 DIAGNOSIS — F0280 Dementia in other diseases classified elsewhere without behavioral disturbance: Secondary | ICD-10-CM

## 2017-03-10 DIAGNOSIS — G309 Alzheimer's disease, unspecified: Secondary | ICD-10-CM

## 2017-03-10 NOTE — Progress Notes (Signed)
DATE:  03/10/2017   MRN:  [id number]  BIRTHDAY: 1946-07-02  Facility:  Nursing Home Location:  [facility] Room Number: 117-A  LEVEL OF CARE:  SNF [id number](31)  Contact Information    Name Relation Home Work [place]   Shark,[name] Spouse [phone]  [phone]   Towanda MalkinWilliamson,[name] J Son   [phone]   Buena IrishWilliamson,Chris Son   [phone]       Code Status History    Date Active Date Inactive Code Status Order ID Comments User Context   05/17/2012  6:17 PM 05/21/2012  5:48 PM Full Code [id number]  Jefferey Picaripp, Heather S, RN Inpatient       Chief Complaint  Patient presents with  . Medical Management of Chronic Issues    Routine visit, left eye infection    HISTORY OF PRESENT ILLNESS:  This is a 71-YO female seen for a routine visit.  She is a long-term care resident at [facility]. She has a PMH of renal insufficiency, dementia, hypertension and psychosis.  She was seen in the room today with husband at bedside. She was noted to have a small stye on her left lower conjunctiva with yellowish/greenish crusting. Left eye slightly swollen. BPs has been elevated upon review - 145/96, 210/100, 156/84, 158/99, 155/77, 160/80.    PAST MEDICAL HISTORY:  Past Medical History:  Diagnosis Date  . Dementia    "alzheimer's" (05/18/2012)  . Fall 05/17/2012  . Fracture of medial wall of orbit (HCC) 05/17/2012  . Gout    "? feet" (05/18/2012); uric acid 9.4 on 08/06/16 with R wrist pain; 7.1 on 08/15/16 on Allopurinol  . Hypertension   . Incontinence of urine   . Stroke [facility](HCC) ~ 2003   "slight memory loss" (05/18/2012)  . Wrist fracture, bilateral 05/17/2012   "fell down steps" (05/18/2012)     CURRENT MEDICATIONS: Reviewed  Patient's Medications  New Prescriptions   No medications on file  Previous Medications   ACETAMINOPHEN (TYLENOL) 325 MG TABLET    Take 650 mg by mouth every 6 (six) hours as needed for moderate pain or fever.   ALLOPURINOL (ZYLOPRIM) 150 MG TABS TABLET    Take 150 mg by mouth daily.   AMBULATORY NON FORMULARY MEDICATION    Give 1 magic cup by mouth twice a day for supplement.   ATORVASTATIN (LIPITOR) 10 MG TABLET    Take 10 mg by mouth daily.   CALCIUM-VITAMIN D PO    Take 500 mg by mouth daily.   CLONIDINE (CATAPRES) 0.1 MG TABLET    Take 0.1 mg by mouth 2 (two) times daily. Hold for sbp <120   DOCUSATE SODIUM (COLACE PO)    Take by mouth 2 (two) times daily.   LABETALOL (NORMODYNE) 300 MG TABLET    Take 300 mg by mouth 2 (two) times daily.   LINACLOTIDE (LINZESS) 72 MCG CAPSULE    Take 72 mcg by mouth every other day.   LORATADINE (CLARITIN) 10 MG TABLET    Take 10 mg by mouth daily as needed for allergies.    POLYETHYLENE GLYCOL (MIRALAX / GLYCOLAX) PACKET    Take 17 g by mouth daily as needed.   Modified Medications   No medications on file  Discontinued Medications   No medications on file     No Known Allergies   REVIEW OF SYSTEMS:   Unable to obtain due to dementia    PHYSICAL EXAMINATION  GENERAL APPEARANCE: Well nourished. In no acute distress.  Normal body habitus SKIN:  Skin is warm and dry.  HEAD: Normal in size and contour. No evidence of trauma EYES: left lower conjunctiva has small stye with yellowish/greenish crusting EARS: Pinnae are normal.  RESPIRATORY: breathing is even & unlabored, BS CTAB CARDIAC: RRR, no murmur,no extra heart sounds, no edema GI: abdomen soft, normal BS, no masses, no tenderness, no hepatomegaly, no splenomegaly EXTREMITIES:  No edema nor cyanosis PSYCHIATRIC:  Affect and behavior are appropriate    LABS/RADIOLOGY: Labs reviewed: Basic Metabolic Panel:  Recent Labs  16/05/9608/03/17 08/15/16 12/17/16  NA 144 142 143  K 3.8 3.9 4.0  BUN 35* 33* 18  CREATININE 2.1* 1.6* 1.5*   Liver Function Tests:  Recent Labs  04/12/16  AST 17  ALT 18  ALKPHOS 82   CBC:  Recent Labs  04/12/16 08/06/16 12/09/16  WBC 8.4 9.1 8.9  HGB 11.8* 13.1  11.9*  HCT 36 41 37  PLT 267 283 290   Lipid Panel:  Recent Labs  12/17/16  HDL 35    ASSESSMENT/PLAN:  1. Hordeolum externum of left lower eyelid - start erythromycin chromic ointment instill ~1 cm ribbon to left lower conjunctiva 5 times/day 7 days, keep ice clean   2. Alzheimer's dementia without behavioral disturbance, unspecified timing of dementia onset - continue supportive care; all precautions   3. Chronic constipation - stable; continue Linzess 72 g 1 capsule by mouth every other day and docusate 100 mg 1 capsule by mouth twice a day   4. Uncontrolled hypertension - uncontrolled, increase clonidine 0.2 mg 1 tab by mouth every morning and 0.1 mg 1 tab by mouth daily at bedtime and continue labetalol 300 mg 1 tab by mouth twice a day,  BP twice a day 1 week   5. Mixed hyperlipidemia - continue Lipitor 10 mg 1 tab by mouth daily Lab Results  Component Value Date   CHOL 126 12/17/2016   HDL 35 12/17/2016   LDLCALC 69 12/17/2016   TRIG 109 12/17/2016     6. History of gout - continue allopurinol 100 mg give 1 1/2 tab = 150 mg daily     Goals of care:  Long-term care    Gari Hartsell C. [name] - NP   [facility] [phone]

## 2017-04-06 ENCOUNTER — Non-Acute Institutional Stay (SKILLED_NURSING_FACILITY): Payer: Medicare Other

## 2017-04-06 DIAGNOSIS — Z0000 Encounter for general adult medical examination without abnormal findings: Secondary | ICD-10-CM

## 2017-04-06 NOTE — Patient Instructions (Signed)
[name] , Thank you for taking time to come for your Medicare Wellness Visit. I appreciate your ongoing commitment to your health goals. Please review the following plan we discussed and let me know if I can assist you in the future.   Screening recommendations/referrals: Colonoscopy excluded long term pt Mammogram excluded long term pt Bone Density excluded, nonambulatory Recommended yearly ophthalmology/optometry visit for glaucoma screening and checkup Recommended yearly dental visit for hygiene and checkup  Vaccinations: Influenza vaccine due 04/10/2017 Pneumococcal vaccine up to date Tdap vaccine due, ordered Shingles vaccine not in records  Advanced directives: Need a copy for chart  Conditions/risks identified: None  Next appointment: Dr. Alwyn Ren makes rounds   Preventive Care 65 Years and Older, Female Preventive care refers to lifestyle choices and visits with your health care provider that can promote health and wellness. What does preventive care include?  A yearly physical exam. This is also called an annual well check.  Dental exams once or twice a year.  Routine eye exams. Ask your health care provider how often you should have your eyes checked.  Personal lifestyle choices, including:  Daily care of your teeth and gums.  Regular physical activity.  Eating a healthy diet.  Avoiding tobacco and drug use.  Limiting alcohol use.  Practicing safe sex.  Taking low-dose aspirin every day.  Taking vitamin and mineral supplements as recommended by your health care provider. What happens during an annual well check? The services and screenings done by your health care provider during your annual well check will depend on your age, overall health, lifestyle risk factors, and family history of disease. Counseling  Your health care provider may ask you questions about your:  Alcohol use.  Tobacco use.  Drug use.  Emotional well-being.  Home and  relationship well-being.  Sexual activity.  Eating habits.  History of falls.  Memory and ability to understand (cognition).  Work and work Astronomer.  Reproductive health. Screening  You may have the following tests or measurements:  Height, weight, and BMI.  Blood pressure.  Lipid and cholesterol levels. These may be checked every 5 years, or more frequently if you are over 60 years old.  Skin check.  Lung cancer screening. You may have this screening every year starting at age 79 if you have a 30-pack-year history of smoking and currently smoke or have quit within the past 15 years.  Fecal occult blood test (FOBT) of the stool. You may have this test every year starting at age 42.  Flexible sigmoidoscopy or colonoscopy. You may have a sigmoidoscopy every 5 years or a colonoscopy every 10 years starting at age 53.  Hepatitis C blood test.  Hepatitis B blood test.  Sexually transmitted disease (STD) testing.  Diabetes screening. This is done by checking your blood sugar (glucose) after you have not eaten for a while (fasting). You may have this done every 1-3 years.  Bone density scan. This is done to screen for osteoporosis. You may have this done starting at age 46.  Mammogram. This may be done every 1-2 years. Talk to your health care provider about how often you should have regular mammograms. Talk with your health care provider about your test results, treatment options, and if necessary, the need for more tests. Vaccines  Your health care provider may recommend certain vaccines, such as:  Influenza vaccine. This is recommended every year.  Tetanus, diphtheria, and acellular pertussis (Tdap, Td) vaccine. You may need a Td booster every 10  years.  Zoster vaccine. You may need this after age 60.  Pneumococcal 13-valent conjugate (PCV13) vaccine. One dose is recommended after age 57.  Pneumococcal polysaccharide (PPSV23) vaccine. One dose is recommended after  age 52. Talk to your health care provider about which screenings and vaccines you need and how often you need them. This information is not intended to replace advice given to you by your health care provider. Make sure you discuss any questions you have with your health care provider. Document Released: 08/23/2015 Document Revised: 04/15/2016 Document Reviewed: 05/28/2015 Elsevier Interactive Patient Education  2017 [place] Prevention in the Home Falls can cause injuries. They can happen to people of all ages. There are many things you can do to make your home safe and to help prevent falls. What can I do on the outside of my home?  Regularly fix the edges of walkways and driveways and fix any cracks.  Remove anything that might make you trip as you walk through a door, such as a raised step or threshold.  Trim any bushes or trees on the path to your home.  Use bright outdoor lighting.  Clear any walking paths of anything that might make someone trip, such as rocks or tools.  Regularly check to see if handrails are loose or broken. Make sure that both sides of any steps have handrails.  Any raised decks and porches should have guardrails on the edges.  Have any leaves, snow, or ice cleared regularly.  Use sand or salt on walking paths during winter.  Clean up any spills in your garage right away. This includes oil or grease spills. What can I do in the bathroom?  Use night lights.  Install grab bars by the toilet and in the tub and shower. Do not use towel bars as grab bars.  Use non-skid mats or decals in the tub or shower.  If you need to sit down in the shower, use a plastic, non-slip stool.  Keep the floor dry. Clean up any water that spills on the floor as soon as it happens.  Remove soap buildup in the tub or shower regularly.  Attach bath mats securely with double-sided non-slip rug tape.  Do not have throw rugs and other things on the floor that can  make you trip. What can I do in the bedroom?  Use night lights.  Make sure that you have a light by your bed that is easy to reach.  Do not use any sheets or blankets that are too big for your bed. They should not hang down onto the floor.  Have a firm chair that has side arms. You can use this for support while you get dressed.  Do not have throw rugs and other things on the floor that can make you trip. What can I do in the kitchen?  Clean up any spills right away.  Avoid walking on wet floors.  Keep items that you use a lot in easy-to-reach places.  If you need to reach something above you, use a strong step stool that has a grab bar.  Keep electrical cords out of the way.  Do not use floor polish or wax that makes floors slippery. If you must use wax, use non-skid floor wax.  Do not have throw rugs and other things on the floor that can make you trip. What can I do with my stairs?  Do not leave any items on the stairs.  Make sure that  there are handrails on both sides of the stairs and use them. Fix handrails that are broken or loose. Make sure that handrails are as long as the stairways.  Check any carpeting to make sure that it is firmly attached to the stairs. Fix any carpet that is loose or worn.  Avoid having throw rugs at the top or bottom of the stairs. If you do have throw rugs, attach them to the floor with carpet tape.  Make sure that you have a light switch at the top of the stairs and the bottom of the stairs. If you do not have them, ask someone to add them for you. What else can I do to help prevent falls?  Wear shoes that:  Do not have high heels.  Have rubber bottoms.  Are comfortable and fit you well.  Are closed at the toe. Do not wear sandals.  If you use a stepladder:  Make sure that it is fully opened. Do not climb a closed stepladder.  Make sure that both sides of the stepladder are locked into place.  Ask someone to hold it for you,  if possible.  Clearly mark and make sure that you can see:  Any grab bars or handrails.  First and last steps.  Where the edge of each step is.  Use tools that help you move around (mobility aids) if they are needed. These include:  Canes.  Walkers.  Scooters.  Crutches.  Turn on the lights when you go into a dark area. Replace any light bulbs as soon as they burn out.  Set up your furniture so you have a clear path. Avoid moving your furniture around.  If any of your floors are uneven, fix them.  If there are any pets around you, be aware of where they are.  Review your medicines with your doctor. Some medicines can make you feel dizzy. This can increase your chance of falling. Ask your doctor what other things that you can do to help prevent falls. This information is not intended to replace advice given to you by your health care provider. Make sure you discuss any questions you have with your health care provider. Document Released: 05/23/2009 Document Revised: 01/02/2016 Document Reviewed: 08/31/2014 Elsevier Interactive Patient Education  2017 [employer].

## 2017-04-06 NOTE — Progress Notes (Signed)
Subjective:   Amanda [name] is a 71 y.o. female who presents for Medicare Annual (Subsequent) preventive examination at [facility]; incapacitated patient unable to answer questions appropriately   Last AWV-12/06/15    Objective:     Vitals: BP 130/65 (BP Location: Left Arm, Patient Position: Sitting)   Pulse 67   Temp (!) 96.8 F (36 C) (Oral)   Ht 5\' 1"  (1.549 m)   Wt 137 lb (62.1 kg)   SpO2 97%   BMI 25.89 kg/m   Body mass index is 25.89 kg/m.   Tobacco History  Smoking Status  . Former Smoker  . Types: Cigarettes  Smokeless Tobacco  . Never Used    Comment: Staff at [facility] reports that patient no longer smokes.      Counseling given: Not Answered   Past Medical History:  Diagnosis Date  . Dementia    "alzheimer's" (05/18/2012)  . Fall 05/17/2012  . Fracture of medial wall of orbit (HCC) 05/17/2012  . Gout    "? feet" (05/18/2012); uric acid 9.4 on 08/06/16 with R wrist pain; 7.1 on 08/15/16 on Allopurinol  . Hypertension   . Incontinence of urine   . Stroke [facility]) ~ 2003   "slight memory loss" (05/18/2012)  . Wrist fracture, bilateral 05/17/2012   "fell down steps" (05/18/2012)   Past Surgical History:  Procedure Laterality Date  . fractured tooth     01/06/17 Dr Ocie Doyne DMD extracted tooth  #4   . VAGINAL HYSTERECTOMY     Family History  Problem Relation Age of Onset  . Dementia Mother   . Dementia Father   . Dementia Sister   . Dementia Brother    History  Sexual Activity  . Sexual activity: Not Currently    Outpatient Encounter Prescriptions as of 04/06/2017  Medication Sig  . acetaminophen (TYLENOL) 325 MG tablet Take 650 mg by mouth every 6 (six) hours as needed for moderate pain or fever.  Marland Kitchen allopurinol (ZYLOPRIM) 150 mg TABS tablet Take 150 mg by mouth daily.  . AMBULATORY NON FORMULARY MEDICATION Give 1 magic cup by mouth twice a day for supplement.  Marland Kitchen atorvastatin (LIPITOR) 10 MG tablet Take 10 mg by mouth daily.    Marland Kitchen CALCIUM-VITAMIN D PO Take 500 mg by mouth daily.  . cloNIDine (CATAPRES) 0.1 MG tablet Take 0.1 mg by mouth 2 (two) times daily. Hold for sbp <120  . Docusate Sodium (COLACE PO) Take by mouth 2 (two) times daily.  Marland Kitchen labetalol (NORMODYNE) 300 MG tablet Take 300 mg by mouth 2 (two) times daily.  Marland Kitchen linaclotide (LINZESS) 72 MCG capsule Take 72 mcg by mouth every other day.  . loratadine (CLARITIN) 10 MG tablet Take 10 mg by mouth daily as needed for allergies.   . polyethylene glycol (MIRALAX / GLYCOLAX) packet Take 17 g by mouth daily as needed.    No facility-administered encounter medications on file as of 04/06/2017.     Activities of Daily Living In your present state of health, do you have any difficulty performing the following activities: 04/06/2017  Hearing? Y  Vision? Y  Difficulty concentrating or making decisions? Y  Walking or climbing stairs? Y  Dressing or bathing? Y  Doing errands, shopping? Y  Preparing Food and eating ? Y  Using the Toilet? Y  In the past six months, have you accidently leaked urine? Y  Do you have problems with loss of bowel control? Y  Managing your Medications? Jeannie Fend  Managing your Finances? Y  Housekeeping or managing your Housekeeping? Y  Some recent data might be hidden    Patient Care Team: Pecola Lawless, MD as PCP - General (Internal Medicine) Sharon Seller, NP as Nurse Practitioner (Geriatric Medicine)    Assessment:     Exercise Activities and Dietary recommendations Current Exercise Habits: The patient does not participate in regular exercise at present, Exercise limited by: orthopedic condition(s)  Goals    None     Fall Risk Fall Risk  04/06/2017 09/23/2016 09/11/2016 06/10/2016 04/10/2016  Falls in the past year? Yes No No No Exclusion - non ambulatory  Number falls in past yr: 1 - - - -  Injury with Fall? Yes - - - -   Depression Screen PHQ 2/9 Scores 04/06/2017 02/05/2016  Exception Documentation Medical reason Other-  indicate reason in comment box  Not completed - Patient at nursing facility.     Cognitive Function MMSE - Mini Mental State Exam 04/06/2017  Not completed: Unable to complete        Immunization History  Administered Date(s) Administered  . Influenza-Unspecified 05/10/2013, 05/11/2014, 05/22/2015, 05/14/2016  . Pneumococcal-Unspecified 06/03/2012, 05/14/2016   Screening Tests Health Maintenance  Topic Date Due  . INFLUENZA VACCINE  03/10/2017  . TETANUS/TDAP  2020-09-1423 (Originally 09/25/1964)  . Hepatitis C Screening  2020-09-1423 (Originally 12-Jun-1946)  . MAMMOGRAM  03/09/2024 (Originally 11/26/2014)  . COLONOSCOPY  03/09/2024 (Originally 09/25/1993)  . PNA vac Low Risk Adult (2 of 2 - PCV13) 05/14/2017  . DEXA SCAN  Excluded      Plan:    I have personally reviewed and addressed the Medicare Annual Wellness questionnaire and have noted the following in the patient's chart:  A. Medical and social history B. Use of alcohol, tobacco or illicit drugs  C. Current medications and supplements D. Functional ability and status E.  Nutritional status F.  Physical activity G. Advance directives H. List of other physicians I.  Hospitalizations, surgeries, and ER visits in previous 12 months J.  Vitals K. Screenings to include hearing, vision, cognitive, depression L. Referrals and appointments - none  In addition, I am unable to review and discuss with incapacitated patient certain preventive protocols, quality metrics, and best practice recommendations. A written personalized care plan for preventive services as well as general preventive health recommendations were provided to patient.   See attached scanned questionnaire for additional information.   Signed,   Annetta Maw, RN Nurse Health Advisor   Quick Notes   Health Maintenance: Hep C screen TDAP due     Abnormal Screen: Unable to complete mental exam     Patient Concerns: none     Nurse Concerns:  none I have personally reviewed the health advisor's clinical note, was available for consultation, and agree with the assessment and plan as written. Pecola Lawless M.D., FACP, [facility]

## 2017-04-12 DIAGNOSIS — Z1159 Encounter for screening for other viral diseases: Secondary | ICD-10-CM | POA: Diagnosis not present

## 2017-04-14 LAB — HM HEPATITIS C SCREENING LAB: HM HEPATITIS C SCREENING: NEGATIVE

## 2017-04-16 ENCOUNTER — Encounter: Payer: Self-pay | Admitting: Adult Health

## 2017-04-16 ENCOUNTER — Non-Acute Institutional Stay (SKILLED_NURSING_FACILITY): Payer: Medicare Other | Admitting: Adult Health

## 2017-04-16 DIAGNOSIS — Z8739 Personal history of other diseases of the musculoskeletal system and connective tissue: Secondary | ICD-10-CM

## 2017-04-16 DIAGNOSIS — K5909 Other constipation: Secondary | ICD-10-CM | POA: Diagnosis not present

## 2017-04-16 DIAGNOSIS — I10 Essential (primary) hypertension: Secondary | ICD-10-CM

## 2017-04-16 DIAGNOSIS — F0280 Dementia in other diseases classified elsewhere without behavioral disturbance: Secondary | ICD-10-CM

## 2017-04-16 DIAGNOSIS — E782 Mixed hyperlipidemia: Secondary | ICD-10-CM | POA: Diagnosis not present

## 2017-04-16 DIAGNOSIS — G309 Alzheimer's disease, unspecified: Secondary | ICD-10-CM | POA: Diagnosis not present

## 2017-04-16 NOTE — Progress Notes (Signed)
DATE:  04/16/2017   MRN:  [id number]  BIRTHDAY: 1945/12/15  Facility:  Nursing Home Location:  [facility] Room Number: 117-A  LEVEL OF CARE:  [id number])  Contact Information    Name Relation Home Work [place] Spouse [phone]  [phone]   Deazia, Lampi   [phone]   Karalina, Tift   [phone]       Code Status History    Date Active Date Inactive Code Status Order ID Comments User Context   05/17/2012  6:17 PM 05/21/2012  5:48 PM Full Code [id number]  Jefferey Pica, RN Inpatient       Chief Complaint  Patient presents with  . Medical Management of Chronic Issues    Routine visit    HISTORY OF PRESENT ILLNESS:  This is a 71-YO female seen for a routine visit.  She is a long-term care resident at [facility].  She has a PMH of renal insufficiency, dementia, HTN, and psychosis. She was seen in her room today. Husband was with her. She has been stable for the past month.      PAST MEDICAL HISTORY:  Past Medical History:  Diagnosis Date  . Dementia    "alzheimer's" (05/18/2012)  . Fall 05/17/2012  . Fracture of medial wall of orbit (HCC) 05/17/2012  . Gout    "? feet" (05/18/2012); uric acid 9.4 on 08/06/16 with R wrist pain; 7.1 on 08/15/16 on Allopurinol  . Hypertension   . Incontinence of urine   . Stroke [facility]) ~ 2003   "slight memory loss" (05/18/2012)  . Wrist fracture, bilateral 05/17/2012   "fell down steps" (05/18/2012)     CURRENT MEDICATIONS: Reviewed  Patient's Medications  New Prescriptions   No medications on file  Previous Medications   ACETAMINOPHEN (TYLENOL) 325 MG TABLET    Take 650 mg by mouth every 6 (six) hours as needed for moderate pain or fever.   ALLOPURINOL (ZYLOPRIM) 150 MG TABS TABLET    Take 150 mg by mouth daily.   AMBULATORY NON FORMULARY MEDICATION    Give 1 magic cup by mouth twice a day for supplement.   ATORVASTATIN (LIPITOR) 10 MG  TABLET    Take 10 mg by mouth daily.   CALCIUM-VITAMIN D PO    Take 500 mg by mouth daily.   CLONIDINE (CATAPRES) 0.1 MG TABLET    Take 0.1-0.2 mg by mouth. Take 0.2 mg QAM, 0.1 mg QPM   DOCUSATE SODIUM (COLACE PO)    Take by mouth 2 (two) times daily.   LABETALOL (NORMODYNE) 300 MG TABLET    Take 300 mg by mouth 2 (two) times daily.   LINACLOTIDE (LINZESS) 72 MCG CAPSULE    Take 72 mcg by mouth every other day.   LORATADINE (CLARITIN) 10 MG TABLET    Take 10 mg by mouth daily as needed for allergies.    POLYETHYLENE GLYCOL (MIRALAX / GLYCOLAX) PACKET    Take 17 g by mouth daily as needed.   Modified Medications   No medications on file  Discontinued Medications   No medications on file     No Known Allergies   REVIEW OF SYSTEMS:  Unable to obtain due to dementia    PHYSICAL EXAMINATION  GENERAL APPEARANCE: Well nourished. In no acute distress. Normal body habitus SKIN:  Skin is warm and dry.  RESPIRATORY: breathing is even & unlabored, BS CTAB CARDIAC: RRR, no murmur,no extra heart sounds, no  edema GI: abdomen soft, normal BS, no masses EXTREMITIES:  Has splint on bilateral elbow PSYCHIATRIC:  Affect and behavior are appropriate    LABS/RADIOLOGY: Labs reviewed: Basic Metabolic Panel:  Recent Labs  16/05/9600/06/18 12/17/16  NA 142 143  K 3.9 4.0  BUN 33* 18  CREATININE 1.6* 1.5*    CBC:  Recent Labs  08/06/16 12/09/16  WBC 9.1 8.9  HGB 13.1 11.9*  HCT 41 37  PLT 283 290   Lipid Panel:  Recent Labs  12/17/16  HDL 35    ASSESSMENT/PLAN:   1. Essential hypertension, benign - well-controlled, continue Clnidine 0.2 mg 1 tab Q AM, Clonidine 0.1 1 tab Q PM, Labetalol 300 mg 1 tab BID   2. History of gout - continue Allopurinol 100 mg give  1 1/2 tab = 150 mg daily   3. Chronic constipation - continue Linzess 72 mcg 1 capsule Q other day, Docusate 100 mg 1 capsule BID, Miralax 17 gm daily PRN   4. Mixed hyperlipidemia - continue Lipitor 10 mg 1 tab daily    Lab Results  Component Value Date   CHOL 126 12/17/2016   HDL 35 12/17/2016   LDLCALC 69 12/17/2016   TRIG 109 12/17/2016     5. Alzheimer's dementia without behavioral disturbance, unspecified timing of dementia onset - continue supportive care, fall precautions      Goals of care:  Long-term care     Monina C. [name] - NP    [facility] [phone]

## 2017-05-13 ENCOUNTER — Encounter: Payer: Self-pay | Admitting: Internal Medicine

## 2017-05-13 ENCOUNTER — Non-Acute Institutional Stay (SKILLED_NURSING_FACILITY): Payer: Medicare Other | Admitting: Internal Medicine

## 2017-05-13 DIAGNOSIS — F02818 Dementia in other diseases classified elsewhere, unspecified severity, with other behavioral disturbance: Secondary | ICD-10-CM

## 2017-05-13 DIAGNOSIS — G300 Alzheimer's disease with early onset: Secondary | ICD-10-CM | POA: Diagnosis not present

## 2017-05-13 DIAGNOSIS — I10 Essential (primary) hypertension: Secondary | ICD-10-CM | POA: Diagnosis not present

## 2017-05-13 DIAGNOSIS — N183 Chronic kidney disease, stage 3 unspecified: Secondary | ICD-10-CM

## 2017-05-13 DIAGNOSIS — D638 Anemia in other chronic diseases classified elsewhere: Secondary | ICD-10-CM | POA: Diagnosis not present

## 2017-05-13 DIAGNOSIS — Z8739 Personal history of other diseases of the musculoskeletal system and connective tissue: Secondary | ICD-10-CM | POA: Diagnosis not present

## 2017-05-13 DIAGNOSIS — F0281 Dementia in other diseases classified elsewhere with behavioral disturbance: Secondary | ICD-10-CM

## 2017-05-13 NOTE — Assessment & Plan Note (Signed)
Check uric acid and renal function to assess possible need for adjustment in allopurinol dose

## 2017-05-13 NOTE — Progress Notes (Signed)
    NURSING HOME LOCATION:  [facility] ROOM NUMBER:  117-A  CODE STATUS:  Full Code  PCP:  Pecola Lawless, MD  [address] [place] [address]   This is a nursing facility follow up of chronic medical diagnoses  Interim medical record and care since last [facility] visit was updated with review of diagnostic studies and change in clinical status since last visit were documented.  HPI: The patient is a permanent resident of SNF with multiple comorbidities. These include essential hypertension dementia with behavioral disturbance, chronic kidney disease, dyslipidemia, anemia of chronic disease, allergic rhinitis, and history of gout. She is on low-dose allopurinol because of history of gout. The dose had been reduced because of chronic renal disease. On 5/2 hemoglobin 11.9/hematocrit 37 down from values of 13.1/41 in December 2017. She had been seen on 5/1 for bleeding attributed to a fractured left maxillary incisor causing lip and oral mucosal trauma with bleeding. Dyslipidemia is treated with low-dose atorvastatin. She is on dual therapy for her hypertension. Linzess has been prescribed for chronic constipation. She is on nonsedating antihistamine for the allergic rhinitis. The last labs were approximately 5 months ago as noted. Renal function was stable with a creatinine of 1.5. Lipids were excellent except for a reduced HDL of 35.  Review of systems: Patient was nonverbal. In fact she did not open her eyes during exam.  Physical exam:  Pertinent or positive findings: Patient remains somnolent throughout exam. Unlike on previous exam she did not resist examination.  When her eyes were passively opened, there was right lateral deviation of both eyes. Heart sounds are distant but slow and regular. She has intermittent low-grade rhonchi mainly over the upper airway. Posterior tibial pulses are palpable. Dorsalis pedis pulses are decreased. She keeps her arms crossed over  her chest.  General appearance:Adequately nourished; no acute distress , increased work of breathing is present.   Lymphatic: No lymphadenopathy about the head, neck, axilla . Eyes: No conjunctival inflammation or lid edema is present. There is no scleral icterus. Ears:  External ear exam shows no significant lesions or deformities.   Nose:  External nasal examination shows no deformity or inflammation. Nasal mucosa are pink and moist without lesions ,exudates Neck:  No thyromegaly, masses, tenderness noted.    Heart: S1 and S2 normal without gallop, murmur, click, rub .  Lungs: without wheezes, rales , rubs. Abdomen:Bowel sounds are normal. Abdomen is soft and nontender with no organomegaly, hernias,masses. GU: deferred  Extremities:  No cyanosis, clubbing,edema  Skin: Warm & dry w/o tenting. No significant lesions or rash.  See summary under each active problem in the Problem List with associated updated therapeutic plan

## 2017-05-13 NOTE — Assessment & Plan Note (Signed)
Update BMET 

## 2017-05-13 NOTE — Assessment & Plan Note (Signed)
Hemoglobin drop in May was probably from old bleeding from lip, buccal mucosa trauma from fractured tooth Recheck CBC

## 2017-05-13 NOTE — Assessment & Plan Note (Signed)
BP controlled; no change in antihypertensive medications  

## 2017-05-13 NOTE — Assessment & Plan Note (Signed)
No behavioral dysfunction noted, no change in present medications indicated

## 2017-05-14 DIAGNOSIS — D649 Anemia, unspecified: Secondary | ICD-10-CM | POA: Diagnosis not present

## 2017-05-14 DIAGNOSIS — E785 Hyperlipidemia, unspecified: Secondary | ICD-10-CM | POA: Diagnosis not present

## 2017-05-14 DIAGNOSIS — I10 Essential (primary) hypertension: Secondary | ICD-10-CM | POA: Diagnosis not present

## 2017-05-14 NOTE — Patient Instructions (Signed)
See assessment and plan under each diagnosis in the problem list and acutely for this visit 

## 2017-06-11 ENCOUNTER — Non-Acute Institutional Stay (SKILLED_NURSING_FACILITY): Payer: Medicare Other | Admitting: Adult Health

## 2017-06-11 ENCOUNTER — Encounter: Payer: Self-pay | Admitting: Adult Health

## 2017-06-11 DIAGNOSIS — G309 Alzheimer's disease, unspecified: Secondary | ICD-10-CM

## 2017-06-11 DIAGNOSIS — K5909 Other constipation: Secondary | ICD-10-CM

## 2017-06-11 DIAGNOSIS — N183 Chronic kidney disease, stage 3 unspecified: Secondary | ICD-10-CM

## 2017-06-11 DIAGNOSIS — Z8739 Personal history of other diseases of the musculoskeletal system and connective tissue: Secondary | ICD-10-CM

## 2017-06-11 DIAGNOSIS — F0280 Dementia in other diseases classified elsewhere without behavioral disturbance: Secondary | ICD-10-CM | POA: Diagnosis not present

## 2017-06-11 DIAGNOSIS — I10 Essential (primary) hypertension: Secondary | ICD-10-CM | POA: Diagnosis not present

## 2017-06-11 NOTE — Progress Notes (Signed)
DATE:  06/11/2017 MRN:  [id number]  BIRTHDAY: [birth year]  Facility:  Nursing Home Location:  [facility] Room Number: 117-A  LEVEL OF CARE:  SNF [phone])  Contact Information    Name Relation Home Work [place] Spouse [phone]  [phone]   Hedda, Crumbley   [phone]   Leslieanne, Cobarrubias   [phone]       Code Status History    Date Active Date Inactive Code Status Order ID Comments User Context   05/17/2012  6:17 PM 05/21/2012  5:48 PM Full Code [id number]  Kirstie Mirza, RN Inpatient       Chief Complaint  Patient presents with  . Medical Management of Chronic Issues    Routine [facility] visit    HISTORY OF PRESENT ILLNESS:  This is a 55-YO female seen for a routine visit.  She is a long-term care resident of [facility].  She has a PMH of renal insufficiency, dementia, HTN, and psychosis. She was seen in her room today. She is non-verbal and does not follow command.  No reported concerns.     PAST MEDICAL HISTORY:  Past Medical History:  Diagnosis Date  . Dementia    "alzheimer's" (05/18/2012)  . Fall 05/17/2012  . Fracture of medial wall of orbit ([place]) 05/17/2012  . Gout    "? feet" (05/18/2012); uric acid 9.4 on 08/06/16 with R wrist pain; 7.1 on 08/15/16 on Allopurinol  . Hypertension   . Incontinence of urine   . Stroke [facility]) ~ 2003   "slight memory loss" (05/18/2012)  . Wrist fracture, bilateral 05/17/2012   "fell down steps" (05/18/2012)     CURRENT MEDICATIONS: Reviewed  Patient's Medications  New Prescriptions   No medications on file  Previous Medications   ACETAMINOPHEN (TYLENOL) 325 MG TABLET    Take 650 mg by mouth every 6 (six) hours as needed for moderate pain or fever.   ALLOPURINOL (ZYLOPRIM) 150 MG TABS TABLET    Take 150 mg by mouth daily.   AMBULATORY NON FORMULARY MEDICATION    Give 1 magic cup by mouth twice a day for supplement.   ATORVASTATIN (LIPITOR) 10 MG TABLET    Take 10 mg by mouth daily.   CALCIUM-VITAMIN D PO    Take 500 mg by mouth daily.   CLONIDINE (CATAPRES) 0.1 MG TABLET    Take 0.1-0.2 mg by mouth. Take 0.2 mg QAM, 0.1 mg QPM.  Hold for SBP <120   DOCUSATE SODIUM (COLACE PO)    Take by mouth 2 (two) times daily.   LABETALOL (NORMODYNE) 300 MG TABLET    Take 300 mg by mouth 2 (two) times daily.   LINACLOTIDE (LINZESS) 72 MCG CAPSULE    Take 72 mcg by mouth every other day.   LORATADINE (CLARITIN) 10 MG TABLET    Take 10 mg by mouth daily as needed for allergies.    POLYETHYLENE GLYCOL (MIRALAX / GLYCOLAX) PACKET    Take 17 g by mouth daily as needed.   Modified Medications   No medications on file  Discontinued Medications   No medications on file     No Known Allergies   REVIEW OF SYSTEMS:  Unable to obtain due to dementia    PHYSICAL EXAMINATION  GENERAL APPEARANCE: Well nourished. In no acute distress. Normal body habitus SKIN:  Skin is warm and dry.  MOUTH and THROAT: Lips are without lesions.   RESPIRATORY: breathing is even &  unlabored, BS CTAB CARDIAC: RRR, no murmur,no extra heart sounds, no edema GI: abdomen soft, normal BS, no masses, no tenderness EXTREMITIES: Did not move all 4 extremities, uses lift for transfers, splints on bilateral arms PSYCHIATRIC:  Affect and behavior are appropriate    LABS/RADIOLOGY: Labs reviewed: 05/14/17  WBC 7.2 hemoglobin 12.4 hematocrit 38.2 MCV 92.5 platelet 276 glucose 95 calcium 9.3 creatinine 1.2 BUN 22 sodium 143  K 4.0 eGFR 45.14 Basic Metabolic Panel:  Recent Labs  08/15/16 12/17/16  NA 142 143  K 3.9 4.0  BUN 33* 18  CREATININE 1.6* 1.5*   CBC:  Recent Labs  08/06/16 12/09/16  WBC 9.1 8.9  HGB 13.1 11.9*  HCT 41 37  PLT 283 290   Lipid Panel:  Recent Labs  12/17/16  HDL 35    ASSESSMENT/PLAN:   1. CKD (chronic kidney disease) stage 3, GFR 30-59 ml/min (HCC) - creatinine 1.2, stable   2. Alzheimer's dementia  without behavioral disturbance, unspecified timing of dementia onset - advanced, continue supportive care, fall precautions   3. Essential hypertension, benign - controlled, continue labetalol 300 mg 1 tab twice a day, clonidine 0.2 mg 1 tab in the morning and 0.1 mg 1 tab every afternoon   4. Chronic constipation - continue Linzess 72 g 1 capsule every other day, docusate 100 mg 1 capsule twice a day   5. History of gout - continue allopurinol 100 mg give 1 1/2 tab = 150 mg daily     Goals of care:  Long-term care     Seymone Forlenza C. [place] - NP    [employer] [phone]

## 2017-06-25 DIAGNOSIS — B351 Tinea unguium: Secondary | ICD-10-CM | POA: Diagnosis not present

## 2017-06-25 DIAGNOSIS — I739 Peripheral vascular disease, unspecified: Secondary | ICD-10-CM | POA: Diagnosis not present

## 2017-07-21 ENCOUNTER — Encounter: Payer: Self-pay | Admitting: Adult Health

## 2017-07-21 ENCOUNTER — Non-Acute Institutional Stay (SKILLED_NURSING_FACILITY): Payer: Medicare Other | Admitting: Adult Health

## 2017-07-21 DIAGNOSIS — N183 Chronic kidney disease, stage 3 unspecified: Secondary | ICD-10-CM

## 2017-07-21 DIAGNOSIS — F0280 Dementia in other diseases classified elsewhere without behavioral disturbance: Secondary | ICD-10-CM | POA: Diagnosis not present

## 2017-07-21 DIAGNOSIS — K5909 Other constipation: Secondary | ICD-10-CM

## 2017-07-21 DIAGNOSIS — I10 Essential (primary) hypertension: Secondary | ICD-10-CM

## 2017-07-21 DIAGNOSIS — G309 Alzheimer's disease, unspecified: Secondary | ICD-10-CM | POA: Diagnosis not present

## 2017-07-21 DIAGNOSIS — Z8739 Personal history of other diseases of the musculoskeletal system and connective tissue: Secondary | ICD-10-CM

## 2017-07-21 DIAGNOSIS — E782 Mixed hyperlipidemia: Secondary | ICD-10-CM

## 2017-07-21 NOTE — Progress Notes (Signed)
Location:  [place] Room Number: 117-A Place of Service:  SNF (31) Provider:  Durenda Age, NP  Patient Care Team: Hendricks Limes, MD as PCP - General (Internal Medicine) Nickola Major, NP as Nurse Practitioner (Internal Medicine)  Extended Emergency Contact Information Primary Emergency Contact: Kanaan,[name] Address: [address]          [place], [place] [address] Johnnette Litter of [place] Phone: [phone] Mobile Phone: [phone] Relation: Spouse Secondary Emergency Contact: Nickola Major [place] of [place] Mobile Phone: [phone] Relation: Son  Code Status:  Full Code  Goals of care: Advanced Directive information Advanced Directives 04/06/2017  Does Patient Have a Medical Advance Directive? No  Type of Advance Directive -  Does patient want to make changes to medical advance directive? -  Copy of [place] in Chart? -  Would patient like information on creating a medical advance directive? No - Patient declined  Pre-existing out of facility DNR order (yellow form or pink MOST form) -     Chief Complaint  Patient presents with  . Medical Management of Chronic Issues    Routine [facility] visit    HPI:  Pt is a 71 y.o. female seen today for medical management of chronic diseases.  She is a long-term care resident of [facility].  She has a PMH of renal insufficiency, dementia, HTN, and psychosis. She was seen in her room today while sitting on a broda chair. She was seen sleeping but opens her eyes to verbal stimuli.     Past Medical History:  Diagnosis Date  . Dementia    "alzheimer's" (05/18/2012)  . Fall 05/17/2012  . Fracture of medial wall of orbit ([place]) 05/17/2012  . Gout    "? feet" (05/18/2012); uric acid 9.4 on 08/06/16 with R wrist pain; 7.1 on 08/15/16 on Allopurinol  . Hypertension   . Incontinence of urine   . Stroke [facility]) ~ 2003   "slight memory loss" (05/18/2012)  . Wrist fracture, bilateral 05/17/2012   "fell down steps" (05/18/2012)   Past Surgical History:  Procedure Laterality Date  . fractured tooth     01/06/17 Dr Diona Browner DMD extracted tooth  #4   . VAGINAL HYSTERECTOMY      No Known Allergies  Outpatient Encounter Medications as of 07/21/2017  Medication Sig  . acetaminophen (TYLENOL) 325 MG tablet Take 650 mg by mouth every 6 (six) hours as needed for moderate pain or fever.  Mar[place] Kitchen allopurinol (ZYLOPRIM) 150 mg TABS tablet Take 150 mg by mouth daily.  . AMBULATORY NON FORMULARY MEDICATION Give 1 magic cup by mouth twice a day for supplement.  Mar[place] Kitchen atorvastatin (LIPITOR) 10 MG tablet Take 10 mg by mouth daily.  Mar[place] Kitchen CALCIUM-VITAMIN D PO Take 500 mg by mouth daily.  . cloNIDine (CATAPRES) 0.1 MG tablet Take 0.1-0.2 mg by mouth. Take 0.2 mg QAM, 0.1 mg QPM.  Hold for SBP <120  . Docusate Sodium (COLACE PO) Take by mouth 2 (two) times daily.  Mar[place] Kitchen labetalol (NORMODYNE) 300 MG tablet Take 300 mg by mouth 2 (two) times daily.  Mar[place] Kitchen linaclotide (LINZESS) 72 MCG capsule Take 72 mcg by mouth every other day.  . loratadine (CLARITIN) 10 MG tablet Take 10 mg by mouth daily as needed for allergies.   . polyethylene glycol (MIRALAX / GLYCOLAX) packet Take 17 g by mouth daily as needed.    No facility-administered encounter medications on file as of 07/21/2017.  Review of Systems  Unable to obtain due to dementia    Immunization History  Administered Date(s) Administered  . Influenza-Unspecified 05/10/2013, 05/22/2015, 05/14/2016, 05/23/2017  . Pneumococcal-Unspecified 06/03/2012, 05/14/2016   Pertinent  Health Maintenance Due  Topic Date Due  . PNA vac Low Risk Adult (2 of 2 - PCV13) 07/21/2018 (Originally 05/14/2017)  . MAMMOGRAM  03/09/2024 (Originally 11/26/2014)  . COLONOSCOPY  03/09/2024 (Originally 09/25/1993)  . INFLUENZA VACCINE  Completed  . DEXA SCAN  Discontinued   Fall Risk  04/06/2017 09/23/2016 09/11/2016  06/10/2016 04/10/2016  Falls in the past year? Yes No No No Exclusion - non ambulatory  Number falls in past yr: 1 - - - -  Injury with Fall? Yes - - - -      Vitals:   07/21/17 0909  BP: 134/76  Pulse: 66  Resp: 18  Temp: 98.8 F (37.1 C)  TempSrc: Oral  SpO2: 96%  Weight: 135 lb 6.4 oz (61.4 kg)  Height: '5\' 1"'  (1.549 m)   Body mass index is 25.58 kg/m.  Physical Exam  GENERAL APPEARANCE: Well nourished. In no acute distress. Normal body habitus SKIN:  Skin is warm and dry.  MOUTH and THROAT: Lips are without lesions.   RESPIRATORY: Breathing is even & unlabored, BS CTAB CARDIAC: RRR, no murmur,no extra heart sounds, no edema GI: Abdomen soft, normal BS, no masses EXTREMITIES:  Does not follow command, has bilateral splint on both arms PSYCHIATRIC: Non-verbal. Affect and behavior are appropriate  Labs reviewed: 05/24/17  WBC 7.2 hemoglobin 12.4 hematocrit 38.2 MCV 92.5 platelet 276 glucose 95 calcium 9.3 creatinine 1.2 BUN 22 sodium 143 K4.0 CO2 25 eGFR 51.86 Recent Labs    08/15/16 12/17/16  NA 142 143  K 3.9 4.0  BUN 33* 18  CREATININE 1.6* 1.5*    Recent Labs    08/06/16 12/09/16  WBC 9.1 8.9  HGB 13.1 11.9*  HCT 41 37  PLT 283 290   Lab Results  Component Value Date   TSH 1.61 09/18/2016    Lab Results  Component Value Date   CHOL 126 12/17/2016   HDL 35 12/17/2016   LDLCALC 69 12/17/2016   TRIG 109 12/17/2016     Assessment/Plan  1. Essential hypertension, benign - stable, continue labetalol 300 mg 1tab twice a day, clonidine 0.2 mg 1 tab every morning and 0.1 mg 1 tab every afternoon   2. History of gout - stable, continue allopurinol 100 mg give 1 1/2 tab = 150 mg daily   3. Chronic constipation - stable, continue Linzess 72 g 1 capsule every other day in a.m., docusate sodium 100 mg 1 capsule twice a day and MiraLAX 17 g daily when necessary   4. CKD (chronic kidney disease) stage 3, GFR 30-59 ml/min (HCC) - creatinine 1.2,  stable   5. Alzheimer's dementia without behavioral disturbance, unspecified timing of dementia onset - to use supportive care, fall precautions   6. Mixed hyperlipidemia - continue Lipitor 10 mg 1 tab daily Lab Results  Component Value Date   CHOL 126 12/17/2016   HDL 35 12/17/2016   LDLCALC 69 12/17/2016   TRIG 109 12/17/2016    .  Family/ staff Communication: Discussed with charge nurse plan of care.   Labs/tests ordered:  None   Goals of care:   Long-term care    Durenda Age, NP [facility] [phone] (Monday-Friday 8:00 a.m. - 5:00 p.m.) [phone] (after hours)

## 2017-07-29 DIAGNOSIS — R05 Cough: Secondary | ICD-10-CM | POA: Diagnosis not present

## 2017-07-29 DIAGNOSIS — R0989 Other specified symptoms and signs involving the circulatory and respiratory systems: Secondary | ICD-10-CM | POA: Diagnosis not present

## 2017-07-30 DIAGNOSIS — I129 Hypertensive chronic kidney disease with stage 1 through stage 4 chronic kidney disease, or unspecified chronic kidney disease: Secondary | ICD-10-CM | POA: Diagnosis not present

## 2017-07-30 DIAGNOSIS — R1312 Dysphagia, oropharyngeal phase: Secondary | ICD-10-CM | POA: Diagnosis not present

## 2017-08-01 DIAGNOSIS — R1312 Dysphagia, oropharyngeal phase: Secondary | ICD-10-CM | POA: Diagnosis not present

## 2017-08-01 DIAGNOSIS — I129 Hypertensive chronic kidney disease with stage 1 through stage 4 chronic kidney disease, or unspecified chronic kidney disease: Secondary | ICD-10-CM | POA: Diagnosis not present

## 2017-08-02 DIAGNOSIS — R1312 Dysphagia, oropharyngeal phase: Secondary | ICD-10-CM | POA: Diagnosis not present

## 2017-08-02 DIAGNOSIS — I129 Hypertensive chronic kidney disease with stage 1 through stage 4 chronic kidney disease, or unspecified chronic kidney disease: Secondary | ICD-10-CM | POA: Diagnosis not present

## 2017-08-04 DIAGNOSIS — R1312 Dysphagia, oropharyngeal phase: Secondary | ICD-10-CM | POA: Diagnosis not present

## 2017-08-04 DIAGNOSIS — I129 Hypertensive chronic kidney disease with stage 1 through stage 4 chronic kidney disease, or unspecified chronic kidney disease: Secondary | ICD-10-CM | POA: Diagnosis not present

## 2017-08-05 DIAGNOSIS — R1312 Dysphagia, oropharyngeal phase: Secondary | ICD-10-CM | POA: Diagnosis not present

## 2017-08-05 DIAGNOSIS — I129 Hypertensive chronic kidney disease with stage 1 through stage 4 chronic kidney disease, or unspecified chronic kidney disease: Secondary | ICD-10-CM | POA: Diagnosis not present

## 2017-08-06 DIAGNOSIS — I129 Hypertensive chronic kidney disease with stage 1 through stage 4 chronic kidney disease, or unspecified chronic kidney disease: Secondary | ICD-10-CM | POA: Diagnosis not present

## 2017-08-06 DIAGNOSIS — R1312 Dysphagia, oropharyngeal phase: Secondary | ICD-10-CM | POA: Diagnosis not present

## 2017-08-09 ENCOUNTER — Non-Acute Institutional Stay (SKILLED_NURSING_FACILITY): Payer: Medicare Other | Admitting: Adult Health

## 2017-08-09 ENCOUNTER — Encounter: Payer: Self-pay | Admitting: Adult Health

## 2017-08-09 DIAGNOSIS — F0280 Dementia in other diseases classified elsewhere without behavioral disturbance: Secondary | ICD-10-CM

## 2017-08-09 DIAGNOSIS — R630 Anorexia: Secondary | ICD-10-CM

## 2017-08-09 DIAGNOSIS — I129 Hypertensive chronic kidney disease with stage 1 through stage 4 chronic kidney disease, or unspecified chronic kidney disease: Secondary | ICD-10-CM | POA: Diagnosis not present

## 2017-08-09 DIAGNOSIS — G309 Alzheimer's disease, unspecified: Secondary | ICD-10-CM

## 2017-08-09 DIAGNOSIS — R1312 Dysphagia, oropharyngeal phase: Secondary | ICD-10-CM | POA: Diagnosis not present

## 2017-08-09 DIAGNOSIS — L89152 Pressure ulcer of sacral region, stage 2: Secondary | ICD-10-CM

## 2017-08-09 NOTE — Progress Notes (Signed)
Location:  [facility] Room Number: 117-A Place of Service:  SNF (31) Provider:  Kenard [name], Amanda Sami, NP  Patient Care Team: Amanda [name], Amanda F, MD as PCP - General (Internal Medicine) Amanda [name], Amanda Dorough C, NP as Nurse Practitioner (Internal Medicine)  Extended Emergency Contact Information Primary Emergency Contact: Amanda [name],Amanda [name] Address: [address]          [place], [place] [address] [name] Home Phone: [phone] Mobile Phone: [phone] Relation: Spouse Secondary Emergency Contact: Amanda AppleWilliamson,Amanda [name]  Amanda [name] Mobile Phone: [phone] Relation: Son  Code Status:  Full Code  Goals of care: Advanced Directive information Advanced Directives 08/09/2017  Does Patient Have a Medical Advance Directive? Yes  Type of Advance Directive Out of facility DNR (pink MOST or yellow form)  Does patient want to make changes to medical advance directive? No - Patient declined  Copy of Healthcare Power of Attorney in Chart? -  Would patient like information on creating a medical advance directive? -  Pre-existing out of facility DNR order (yellow form or pink MOST form) -     Chief Complaint  Patient presents with  . Acute Visit    Patient noted to have an open area on coccyx, decreased oral intake of concern to husband, need for possible hospice referral due to progressive dementia    HPI:  Pt is a 71 y.o. female seen today for an acute visit.  She has an open wound on her coccyx that has been dressed by the wound care nurse.  The husband is requesting that labs be drawn to see why her oral intake has decreased, but the nurse feels like it is a progression of her dementia and that she may be appropriate for hospice services.  She is a [facility] care [facility] of [facility].  She has a PMH of renal insufficiency, dementia, hypertension, and psychosis. She was seen in the room today with treatment  nurse.   Past Medical History:  Diagnosis Date  . Dementia    "alzheimer's" (05/18/2012)  . Fall 05/17/2012  . Fracture of medial wall of orbit (HCC) 05/17/2012  . Gout    "? feet" (05/18/2012); uric acid 9.4 on 08/06/16 with R wrist pain; 7.1 on 08/15/16 on Allopurinol  . Hypertension   . Incontinence of urine   . Stroke [facility]) ~ 2003   "slight memory loss" (05/18/2012)  . Wrist fracture, bilateral 05/17/2012   "fell down steps" (05/18/2012)   Past Surgical History:  Procedure Laterality Date  . fractured tooth     01/06/17 Dr Amanda [name] DMD extracted tooth  #4   . VAGINAL HYSTERECTOMY      No Known Allergies  Outpatient Encounter Medications as of 08/09/2017  Medication Sig  . acetaminophen (TYLENOL) 325 MG tablet Take 650 mg by mouth every 6 (six) hours as needed for moderate pain or fever.  Mar[place] Kitchen. allopurinol (ZYLOPRIM) 150 mg TABS tablet Take 150 mg by mouth daily.   . AMBULATORY NON FORMULARY MEDICATION Give 1 magic cup by mouth twice a day for supplement.  Marland Kitchen. atorvastatin (LIPITOR) 10 MG tablet Take 10 mg by mouth daily.  Marland Kitchen. CALCIUM-VITAMIN D PO Take 500 mg by mouth daily.  . cloNIDine (CATAPRES) 0.1 MG tablet Take 0.1-0.2 mg by mouth. Take 0.2 mg QAM, 0.1 mg QPM.  Hold for SBP <120  . Docusate Sodium (COLACE PO) Take 100 mg by mouth 2 (two) times daily.   Marland Kitchen. labetalol (NORMODYNE) 300 MG tablet Take 300 mg  by mouth 2 (two) times daily.  Mar[place] Kitchen. linaclotide (LINZESS) 72 MCG capsule Take 72 mcg by mouth every other day.  . polyethylene glycol (MIRALAX / GLYCOLAX) packet Take 17 g by mouth daily as needed.   . [DISCONTINUED] loratadine (CLARITIN) 10 MG tablet Take 10 mg by mouth daily as needed for allergies.    No facility-administered encounter medications on file as of 08/09/2017.     Review of Systems  Unable to obtain due to dementia    Immunization History  Administered Date(s) Administered  . Influenza-Unspecified 05/10/2013, 05/22/2015, 05/14/2016, 05/23/2017  .  Pneumococcal-Unspecified 06/03/2012, 05/14/2016   Pertinent  Health Maintenance Due  Topic Date Due  . PNA vac Low Risk Adult (2 of 2 - PCV13) 07/21/2018 (Originally 05/14/2017)  . MAMMOGRAM  03/09/2024 (Originally 11/26/2014)  . COLONOSCOPY  03/09/2024 (Originally 09/25/1993)  . INFLUENZA VACCINE  Completed  . DEXA SCAN  Discontinued   Fall Risk  04/06/2017 09/23/2016 09/11/2016 06/10/2016 04/10/2016  Falls in the past year? Yes No No No Exclusion - non ambulatory  Number falls in past yr: 1 - - - -  Injury with Fall? Yes - - - -      Vitals:   08/09/17 0910  BP: 140/62  Pulse: 72  Resp: 20  Temp: 97.8 [name] (36.6 [name])  TempSrc: Oral  SpO2: 98%  Weight: 135 lb 6.4 oz (61.4 kg)  Height: 5\' 1"  (1.549 m)   Body mass index is 25.58 kg/m.  Physical Exam  GENERAL APPEARANCE: Well nourished. In no acute distress. Normal body habitus SKIN:  Pressure ulcer on sacral area, stage 2 MOUTH and THROAT: Lips are without lesions. Oral mucosa is moist and without lesions. RESPIRATORY: Breathing is even & unlabored, BS CTAB CARDIAC: RRR, no murmur,no extra heart sounds, no edema GI: Abdomen soft, normal BS, no masses, no tenderness, no hepatomegaly, no splenomegaly EXTREMITIES: Does not move any extremities, does not follow command NEURO:  Non-verbal PSYCHIATRIC: Affect and behavior are appropriate  Labs reviewed: Recent Labs    08/15/16 12/17/16  NA 142 143  K 3.9 4.0  BUN 33* 18  CREATININE 1.6* 1.5*    Recent Labs    12/09/16  WBC 8.9  HGB 11.9*  HCT 37  PLT 290   Lab Results  Component Value Date   TSH 1.61 09/18/2016    Lab Results  Component Value Date   CHOL 126 12/17/2016   HDL 35 12/17/2016   LDLCALC 69 12/17/2016   TRIG 109 12/17/2016     Assessment/Plan  1. Poor appetite - chest x-ray done showed negative for infiltrated, check CBC and BMP and urinalysis with culture and sensitivity, start Eldertonic 15 ml BID   2. Alzheimer's dementia without behavioral  disturbance, unspecified timing of dementia onset - continue supportive care, fall precautions   3. Sacral pressure ulcer, stage 2 - continue hydrocolloid dressing treatment, keep skin clean and dry      Amanda GowerMonina [name], NP [facility] [phone] (Monday-Friday 8:00 a.m. - 5:00 p.m.) [phone] (after hours)

## 2017-08-10 DIAGNOSIS — D638 Anemia in other chronic diseases classified elsewhere: Secondary | ICD-10-CM | POA: Diagnosis not present

## 2017-08-10 DIAGNOSIS — R319 Hematuria, unspecified: Secondary | ICD-10-CM | POA: Diagnosis not present

## 2017-08-10 DIAGNOSIS — R1312 Dysphagia, oropharyngeal phase: Secondary | ICD-10-CM | POA: Diagnosis not present

## 2017-08-10 DIAGNOSIS — I129 Hypertensive chronic kidney disease with stage 1 through stage 4 chronic kidney disease, or unspecified chronic kidney disease: Secondary | ICD-10-CM | POA: Diagnosis not present

## 2017-08-10 DIAGNOSIS — E785 Hyperlipidemia, unspecified: Secondary | ICD-10-CM | POA: Diagnosis not present

## 2017-08-11 ENCOUNTER — Emergency Department (HOSPITAL_COMMUNITY): Payer: Medicare Other

## 2017-08-11 ENCOUNTER — Inpatient Hospital Stay (HOSPITAL_COMMUNITY)
Admission: EM | Admit: 2017-08-11 | Discharge: 2017-08-18 | DRG: 871 | Disposition: A | Discharge status: Skilled Nursing Facility | Payer: Medicare Other | Attending: Internal Medicine | Admitting: Internal Medicine

## 2017-08-11 ENCOUNTER — Encounter (HOSPITAL_COMMUNITY): Payer: Self-pay | Admitting: Physician Assistant

## 2017-08-11 DIAGNOSIS — F0281 Dementia in other diseases classified elsewhere with behavioral disturbance: Secondary | ICD-10-CM | POA: Diagnosis present

## 2017-08-11 DIAGNOSIS — Z7401 Bed confinement status: Secondary | ICD-10-CM

## 2017-08-11 DIAGNOSIS — E860 Dehydration: Secondary | ICD-10-CM | POA: Diagnosis present

## 2017-08-11 DIAGNOSIS — R627 Adult failure to thrive: Secondary | ICD-10-CM | POA: Diagnosis present

## 2017-08-11 DIAGNOSIS — Z8739 Personal history of other diseases of the musculoskeletal system and connective tissue: Secondary | ICD-10-CM

## 2017-08-11 DIAGNOSIS — N179 Acute kidney failure, unspecified: Secondary | ICD-10-CM | POA: Diagnosis not present

## 2017-08-11 DIAGNOSIS — R1110 Vomiting, unspecified: Secondary | ICD-10-CM

## 2017-08-11 DIAGNOSIS — M109 Gout, unspecified: Secondary | ICD-10-CM | POA: Diagnosis not present

## 2017-08-11 DIAGNOSIS — R32 Unspecified urinary incontinence: Secondary | ICD-10-CM | POA: Diagnosis present

## 2017-08-11 DIAGNOSIS — N183 Chronic kidney disease, stage 3 unspecified: Secondary | ICD-10-CM | POA: Diagnosis present

## 2017-08-11 DIAGNOSIS — R479 Unspecified speech disturbances: Secondary | ICD-10-CM | POA: Diagnosis present

## 2017-08-11 DIAGNOSIS — Z79899 Other long term (current) drug therapy: Secondary | ICD-10-CM

## 2017-08-11 DIAGNOSIS — Z7189 Other specified counseling: Secondary | ICD-10-CM

## 2017-08-11 DIAGNOSIS — R0602 Shortness of breath: Secondary | ICD-10-CM | POA: Diagnosis not present

## 2017-08-11 DIAGNOSIS — R109 Unspecified abdominal pain: Secondary | ICD-10-CM | POA: Diagnosis not present

## 2017-08-11 DIAGNOSIS — F05 Delirium due to known physiological condition: Secondary | ICD-10-CM

## 2017-08-11 DIAGNOSIS — E870 Hyperosmolality and hypernatremia: Secondary | ICD-10-CM | POA: Diagnosis not present

## 2017-08-11 DIAGNOSIS — E785 Hyperlipidemia, unspecified: Secondary | ICD-10-CM | POA: Diagnosis not present

## 2017-08-11 DIAGNOSIS — I10 Essential (primary) hypertension: Secondary | ICD-10-CM | POA: Diagnosis present

## 2017-08-11 DIAGNOSIS — R402441 Other coma, without documented Glasgow coma scale score, or with partial score reported, in the field [EMT or ambulance]: Secondary | ICD-10-CM | POA: Diagnosis not present

## 2017-08-11 DIAGNOSIS — R509 Fever, unspecified: Secondary | ICD-10-CM | POA: Diagnosis not present

## 2017-08-11 DIAGNOSIS — F03918 Unspecified dementia, unspecified severity, with other behavioral disturbance: Secondary | ICD-10-CM | POA: Diagnosis present

## 2017-08-11 DIAGNOSIS — R1310 Dysphagia, unspecified: Secondary | ICD-10-CM | POA: Diagnosis present

## 2017-08-11 DIAGNOSIS — Z87891 Personal history of nicotine dependence: Secondary | ICD-10-CM

## 2017-08-11 DIAGNOSIS — K5909 Other constipation: Secondary | ICD-10-CM | POA: Diagnosis present

## 2017-08-11 DIAGNOSIS — J690 Pneumonitis due to inhalation of food and vomit: Secondary | ICD-10-CM | POA: Diagnosis not present

## 2017-08-11 DIAGNOSIS — E876 Hypokalemia: Secondary | ICD-10-CM | POA: Diagnosis present

## 2017-08-11 DIAGNOSIS — G308 Other Alzheimer's disease: Secondary | ICD-10-CM | POA: Diagnosis not present

## 2017-08-11 DIAGNOSIS — K449 Diaphragmatic hernia without obstruction or gangrene: Secondary | ICD-10-CM | POA: Diagnosis not present

## 2017-08-11 DIAGNOSIS — D638 Anemia in other chronic diseases classified elsewhere: Secondary | ICD-10-CM | POA: Diagnosis present

## 2017-08-11 DIAGNOSIS — L89102 Pressure ulcer of unspecified part of back, stage 2: Secondary | ICD-10-CM

## 2017-08-11 DIAGNOSIS — R296 Repeated falls: Secondary | ICD-10-CM | POA: Diagnosis not present

## 2017-08-11 DIAGNOSIS — L89152 Pressure ulcer of sacral region, stage 2: Secondary | ICD-10-CM | POA: Diagnosis present

## 2017-08-11 DIAGNOSIS — R5381 Other malaise: Secondary | ICD-10-CM | POA: Diagnosis present

## 2017-08-11 DIAGNOSIS — R402433 Glasgow coma scale score 3-8, at hospital admission: Secondary | ICD-10-CM | POA: Diagnosis present

## 2017-08-11 DIAGNOSIS — Z91048 Other nonmedicinal substance allergy status: Secondary | ICD-10-CM

## 2017-08-11 DIAGNOSIS — I716 Thoracoabdominal aortic aneurysm, without rupture: Secondary | ICD-10-CM | POA: Diagnosis present

## 2017-08-11 DIAGNOSIS — F0280 Dementia in other diseases classified elsewhere without behavioral disturbance: Secondary | ICD-10-CM | POA: Diagnosis not present

## 2017-08-11 DIAGNOSIS — G309 Alzheimer's disease, unspecified: Secondary | ICD-10-CM | POA: Diagnosis present

## 2017-08-11 DIAGNOSIS — K2970 Gastritis, unspecified, without bleeding: Secondary | ICD-10-CM | POA: Diagnosis present

## 2017-08-11 DIAGNOSIS — Z66 Do not resuscitate: Secondary | ICD-10-CM | POA: Diagnosis not present

## 2017-08-11 DIAGNOSIS — Z515 Encounter for palliative care: Secondary | ICD-10-CM | POA: Diagnosis not present

## 2017-08-11 DIAGNOSIS — F0391 Unspecified dementia with behavioral disturbance: Secondary | ICD-10-CM | POA: Diagnosis present

## 2017-08-11 DIAGNOSIS — A419 Sepsis, unspecified organism: Principal | ICD-10-CM

## 2017-08-11 DIAGNOSIS — E559 Vitamin D deficiency, unspecified: Secondary | ICD-10-CM | POA: Diagnosis not present

## 2017-08-11 DIAGNOSIS — I129 Hypertensive chronic kidney disease with stage 1 through stage 4 chronic kidney disease, or unspecified chronic kidney disease: Secondary | ICD-10-CM | POA: Diagnosis present

## 2017-08-11 DIAGNOSIS — R6520 Severe sepsis without septic shock: Secondary | ICD-10-CM | POA: Diagnosis not present

## 2017-08-11 DIAGNOSIS — F411 Generalized anxiety disorder: Secondary | ICD-10-CM | POA: Diagnosis present

## 2017-08-11 DIAGNOSIS — Z8673 Personal history of transient ischemic attack (TIA), and cerebral infarction without residual deficits: Secondary | ICD-10-CM

## 2017-08-11 LAB — BASIC METABOLIC PANEL
ANION GAP: 8 (ref 5–15)
ANION GAP: 9 (ref 5–15)
BUN: 40 mg/dL — ABNORMAL HIGH (ref 6–20)
BUN: 37 mg/dL — ABNORMAL HIGH (ref 6–20)
CALCIUM: 9.2 mg/dL (ref 8.9–10.3)
CALCIUM: 8.8 mg/dL — AB (ref 8.9–10.3)
CO2: 20 mmol/L — ABNORMAL LOW (ref 22–32)
CO2: 18 mmol/L — ABNORMAL LOW (ref 22–32)
Chloride: 128 mmol/L — ABNORMAL HIGH (ref 101–111)
Chloride: 129 mmol/L — ABNORMAL HIGH (ref 101–111)
Creatinine, Ser: 1.95 mg/dL — ABNORMAL HIGH (ref 0.44–1.00)
Creatinine, Ser: 1.78 mg/dL — ABNORMAL HIGH (ref 0.44–1.00)
GFR calc Af Amer: 31 mL/min — ABNORMAL LOW (ref >60)
GFR, EST AFRICAN AMERICAN: 28 mL/min — AB (ref >60)
GFR, EST NON AFRICAN AMERICAN: 24 mL/min — AB (ref >60)
GFR, EST NON AFRICAN AMERICAN: 27 mL/min — AB (ref >60)
GLUCOSE: 97 mg/dL (ref 65–99)
GLUCOSE: 84 mg/dL (ref 65–99)
POTASSIUM: 3.5 mmol/L (ref 3.5–5.1)
Potassium: 3.6 mmol/L (ref 3.5–5.1)
Sodium: 156 mmol/L — ABNORMAL HIGH (ref 135–145)
Sodium: 156 mmol/L — ABNORMAL HIGH (ref 135–145)

## 2017-08-11 LAB — COMPREHENSIVE METABOLIC PANEL
ALBUMIN: 3.7 g/dL (ref 3.5–5.0)
ALT: 15 U/L (ref 14–54)
ANION GAP: 10 (ref 5–15)
AST: 18 U/L (ref 15–41)
Alkaline Phosphatase: 77 U/L (ref 38–126)
BILIRUBIN TOTAL: 0.6 mg/dL (ref 0.3–1.2)
BUN: 47 mg/dL — AB (ref 6–20)
CHLORIDE: 127 mmol/L — AB (ref 101–111)
CO2: 21 mmol/L — ABNORMAL LOW (ref 22–32)
Calcium: 9.8 mg/dL (ref 8.9–10.3)
Creatinine, Ser: 2.16 mg/dL — ABNORMAL HIGH (ref 0.44–1.00)
GFR calc Af Amer: 25 mL/min — ABNORMAL LOW (ref >60)
GFR, EST NON AFRICAN AMERICAN: 21 mL/min — AB (ref >60)
GLUCOSE: 110 mg/dL — AB (ref 65–99)
POTASSIUM: 3.6 mmol/L (ref 3.5–5.1)
Sodium: 158 mmol/L — ABNORMAL HIGH (ref 135–145)
Total Protein: 8.3 g/dL — ABNORMAL HIGH (ref 6.5–8.1)

## 2017-08-11 LAB — URINALYSIS, ROUTINE W REFLEX MICROSCOPIC
BILIRUBIN URINE: NEGATIVE
GLUCOSE, UA: NEGATIVE mg/dL
Ketones, ur: NEGATIVE mg/dL
LEUKOCYTES UA: NEGATIVE
NITRITE: NEGATIVE
PH: 5 (ref 5.0–8.0)
Protein, ur: NEGATIVE mg/dL
SPECIFIC GRAVITY, URINE: 1.023 (ref 1.005–1.030)

## 2017-08-11 LAB — CBC WITH DIFFERENTIAL/PLATELET
BASOS ABS: 0 10*3/uL (ref 0.0–0.1)
BASOS PCT: 0 %
Eosinophils Absolute: 0.2 10*3/uL (ref 0.0–0.7)
Eosinophils Relative: 2 %
HEMATOCRIT: 44.3 % (ref 36.0–46.0)
HEMOGLOBIN: 13.6 g/dL (ref 12.0–15.0)
LYMPHS PCT: 17 %
Lymphs Abs: 2.1 10*3/uL (ref 0.7–4.0)
MCH: 30.3 pg (ref 26.0–34.0)
MCHC: 30.7 g/dL (ref 30.0–36.0)
MCV: 98.7 fL (ref 78.0–100.0)
MONO ABS: 0.4 10*3/uL (ref 0.1–1.0)
Monocytes Relative: 3 %
NEUTROS ABS: 9.5 10*3/uL — AB (ref 1.7–7.7)
NEUTROS PCT: 78 %
Platelets: 285 10*3/uL (ref 150–400)
RBC: 4.49 MIL/uL (ref 3.87–5.11)
RDW: 14.3 % (ref 11.5–15.5)
WBC: 12.3 10*3/uL — ABNORMAL HIGH (ref 4.0–10.5)

## 2017-08-11 LAB — RESPIRATORY PANEL BY PCR
ADENOVIRUS-RVPPCR: NOT DETECTED
Bordetella pertussis: NOT DETECTED
CHLAMYDOPHILA PNEUMONIAE-RVPPCR: NOT DETECTED
CORONAVIRUS NL63-RVPPCR: NOT DETECTED
CORONAVIRUS OC43-RVPPCR: NOT DETECTED
Coronavirus 229E: NOT DETECTED
Coronavirus HKU1: NOT DETECTED
INFLUENZA A-RVPPCR: NOT DETECTED
Influenza B: NOT DETECTED
Metapneumovirus: NOT DETECTED
Mycoplasma pneumoniae: NOT DETECTED
PARAINFLUENZA VIRUS 1-RVPPCR: NOT DETECTED
PARAINFLUENZA VIRUS 3-RVPPCR: NOT DETECTED
PARAINFLUENZA VIRUS 4-RVPPCR: NOT DETECTED
Parainfluenza Virus 2: NOT DETECTED
RHINOVIRUS / ENTEROVIRUS - RVPPCR: NOT DETECTED
Respiratory Syncytial Virus: NOT DETECTED

## 2017-08-11 LAB — I-STAT TROPONIN, ED: Troponin i, poc: 0.02 ng/mL (ref 0.00–0.08)

## 2017-08-11 LAB — LACTIC ACID, PLASMA: LACTIC ACID, VENOUS: 1.3 mmol/L (ref 0.5–1.9)

## 2017-08-11 LAB — MAGNESIUM: MAGNESIUM: 2.5 mg/dL — AB (ref 1.7–2.4)

## 2017-08-11 LAB — I-STAT CG4 LACTIC ACID, ED: LACTIC ACID, VENOUS: 1.25 mmol/L (ref 0.5–1.9)

## 2017-08-11 LAB — PROTIME-INR
INR: 1.15
Prothrombin Time: 14.6 seconds (ref 11.4–15.2)

## 2017-08-11 LAB — PROCALCITONIN: Procalcitonin: 0.38 ng/mL

## 2017-08-11 LAB — PHOSPHORUS: Phosphorus: 3.5 mg/dL (ref 2.5–4.6)

## 2017-08-11 MED ORDER — CLONIDINE HCL 0.1 MG PO TABS: 0.1000 mg | ORAL_TABLET | Freq: Every day | ORAL | Status: DC

## 2017-08-11 MED ORDER — ALLOPURINOL 300 MG PO TABS
150.0000 mg | ORAL_TABLET | Freq: Every day | ORAL | Status: DC
  Filled 2017-08-11: qty 1

## 2017-08-11 MED ORDER — SODIUM CHLORIDE 0.9 % IV SOLN
INTRAVENOUS | Status: DC
  Administered 2017-08-11: 13:00:00 via INTRAVENOUS

## 2017-08-11 MED ORDER — VANCOMYCIN HCL IN DEXTROSE 1-5 GM/200ML-% IV SOLN: 1000.0000 mg | INTRAVENOUS | Status: DC

## 2017-08-11 MED ORDER — PIPERACILLIN-TAZOBACTAM IN DEX 2-0.25 GM/50ML IV SOLN
2.2500 g | Freq: Three times a day (TID) | INTRAVENOUS | Status: DC
  Administered 2017-08-11: 2.25 g via INTRAVENOUS
  Filled 2017-08-11 (×4): qty 50

## 2017-08-11 MED ORDER — PIPERACILLIN-TAZOBACTAM 3.375 G IVPB 30 MIN
3.3750 g | Freq: Once | INTRAVENOUS | Status: AC
  Administered 2017-08-11: 3.375 g via INTRAVENOUS
  Filled 2017-08-11: qty 50

## 2017-08-11 MED ORDER — ACETAMINOPHEN 650 MG RE SUPP
650.0000 mg | Freq: Once | RECTAL | Status: AC
  Administered 2017-08-11: 650 mg via RECTAL
  Filled 2017-08-11: qty 1

## 2017-08-11 MED ORDER — SODIUM CHLORIDE 0.9 % IV BOLUS (SEPSIS)
500.0000 mL | Freq: Once | INTRAVENOUS | Status: AC
  Administered 2017-08-11: 500 mL via INTRAVENOUS

## 2017-08-11 MED ORDER — CLONIDINE HCL 0.2 MG PO TABS: 0.2000 mg | ORAL_TABLET | Freq: Every day | ORAL | Status: DC

## 2017-08-11 MED ORDER — HEPARIN SODIUM (PORCINE) 5000 UNIT/ML IJ SOLN
5000.0000 [IU] | Freq: Three times a day (TID) | INTRAMUSCULAR | Status: DC
  Administered 2017-08-11 – 2017-08-18 (×21): 5000 [IU] via SUBCUTANEOUS
  Filled 2017-08-11 (×21): qty 1

## 2017-08-11 MED ORDER — CLONIDINE HCL 0.1 MG PO TABS
0.1000 mg | ORAL_TABLET | Freq: Every day | ORAL | Status: DC
  Filled 2017-08-11: qty 1

## 2017-08-11 MED ORDER — ONDANSETRON HCL 4 MG PO TABS: 4.0000 mg | ORAL_TABLET | Freq: Four times a day (QID) | ORAL | Status: DC | PRN

## 2017-08-11 MED ORDER — CALCIUM-VITAMIN D 500-200 MG-UNIT PO TABS
1.0000 | ORAL_TABLET | Freq: Every day | ORAL | Status: DC
  Filled 2017-08-11: qty 1

## 2017-08-11 MED ORDER — BISACODYL 10 MG RE SUPP: 10.0000 mg | Freq: Every day | RECTAL | Status: DC | PRN

## 2017-08-11 MED ORDER — SODIUM CHLORIDE 0.9 % IV BOLUS (SEPSIS): 500.0000 mL | Freq: Once | INTRAVENOUS | Status: DC

## 2017-08-11 MED ORDER — LINACLOTIDE 72 MCG PO CAPS
72.0000 ug | ORAL_CAPSULE | ORAL | Status: DC
  Filled 2017-08-11: qty 1

## 2017-08-11 MED ORDER — ACETAMINOPHEN 650 MG RE SUPP: 650.0000 mg | Freq: Four times a day (QID) | RECTAL | Status: DC | PRN

## 2017-08-11 MED ORDER — ACETAMINOPHEN 325 MG PO TABS: 650.0000 mg | ORAL_TABLET | Freq: Four times a day (QID) | ORAL | Status: DC | PRN

## 2017-08-11 MED ORDER — SENNOSIDES-DOCUSATE SODIUM 8.6-50 MG PO TABS: 1.0000 | ORAL_TABLET | Freq: Every evening | ORAL | Status: DC | PRN

## 2017-08-11 MED ORDER — PIPERACILLIN-TAZOBACTAM 3.375 G IVPB
3.3750 g | Freq: Three times a day (TID) | INTRAVENOUS | Status: DC
  Administered 2017-08-11 – 2017-08-14 (×8): 3.375 g via INTRAVENOUS
  Filled 2017-08-11 (×10): qty 50

## 2017-08-11 MED ORDER — LABETALOL HCL 200 MG PO TABS
300.0000 mg | ORAL_TABLET | Freq: Two times a day (BID) | ORAL | Status: DC
  Filled 2017-08-11: qty 1

## 2017-08-11 MED ORDER — ONDANSETRON HCL 4 MG/2ML IJ SOLN: 4.0000 mg | Freq: Four times a day (QID) | INTRAMUSCULAR | Status: DC | PRN

## 2017-08-11 MED ORDER — SODIUM CHLORIDE 0.9 % IV SOLN
1250.0000 mg | Freq: Once | INTRAVENOUS | Status: AC
  Administered 2017-08-11: 1250 mg via INTRAVENOUS
  Filled 2017-08-11: qty 1250

## 2017-08-11 MED ORDER — VANCOMYCIN HCL IN DEXTROSE 1-5 GM/200ML-% IV SOLN: 1000.0000 mg | Freq: Once | INTRAVENOUS | Status: DC

## 2017-08-11 MED ORDER — HYDRALAZINE HCL 20 MG/ML IJ SOLN
5.0000 mg | Freq: Three times a day (TID) | INTRAMUSCULAR | Status: DC | PRN
  Filled 2017-08-11: qty 1

## 2017-08-11 MED ORDER — ATORVASTATIN CALCIUM 10 MG PO TABS
10.0000 mg | ORAL_TABLET | Freq: Every day | ORAL | Status: DC
  Filled 2017-08-11: qty 1

## 2017-08-11 NOTE — Progress Notes (Signed)
Patient failed Swallow Eval.  Notified the on call for triad regarding night time PO meds .  Will continue to monitor patient

## 2017-08-11 NOTE — ED Notes (Signed)
Admitting aware no output at this time. Will bladder scan

## 2017-08-11 NOTE — ED Notes (Signed)
EDP aware of temp 

## 2017-08-11 NOTE — ED Notes (Signed)
Admitting paged about po meds. Admitting advised to hold all po meds due to patient mentation

## 2017-08-11 NOTE — ED Triage Notes (Signed)
Per [facility] patient coming from [facility]. Staff states "patient isn't opening eyes like she normally does". Patient has hx of dementia and is normally non verbal / non expressive. Patient opens eyes to painful stimuli.

## 2017-08-11 NOTE — ED Notes (Signed)
Bladder scanned pt monitor read at . Nurse was notified.

## 2017-08-11 NOTE — ED Notes (Signed)
Attempt to In and Out cath pt, was unsuccessful. Mayme GentaHayley, RN assisted this tech during procedure.

## 2017-08-11 NOTE — ED Provider Notes (Addendum)
72 year old female presents today from [facility] where she is on full care and nonverbal.  My history is obtained from the son and daughter-in-law.  Husband is power of attorney but is not at bedside during my evaluation.  Son states that worker at [facility] went and and noted that the patient's face was red and obtained temperature.  Here patient's temperature is 102.7 with a heart rate initially at 113 and blood pressure 129/88. HEENT- some mild diffuse facial erythema Lungs cta cv- tachycardia  Plan IV fluids, cultures, admit for further treatment and evaluation Fever- broad spectrum antibiotics, will send flu swab Volume depletion- Hypernatremia- patient receiving iv fluids AKI I performed a history and physical examination of Amanda [name] and discussed her management with Rhea BleacherJosh Geiple.  I agree with the history, physical, assessment, and plan of care, with the following exceptions: None  I was present for the following procedures: None Time Spent in Critical Care of the patient: None Time spent in discussions with the patient and family: 1810  Amanda [name]     Amanda Gravlin, MD 08/11/17 1020    Margarita Grizzleay, Annalise Mcdiarmid, MD 08/11/17 [phone]

## 2017-08-11 NOTE — Progress Notes (Addendum)
Pharmacy Antibiotic Note  Zykeria P Clinton SawyerWilliamson is a 72 y.o. female admitted on 08/11/2017 with complaints from nursing home staff that patient has been unable to open her eyes like she normally does. She is nonverbal at baseline. She has CKD but appears to have a bump in her Scr today, making her CrCl ~ 15-20 ml/min. LA wnl. Also noted Na 158.   Plan: -Vancomycin 1250 mg IV x1 then 1 g IV q48h -Zosyn 2.25 g IV q8h -Monitor renal fx, cultures, VR as needed -Both drugs will need to be adjusted if her Scr improves  Height: 5\' 1"  (154.9 cm) Weight: 135 lb (61.2 kg) IBW/kg (Calculated) : 47.8  Temp (24hrs), Avg:102.7 F (39.3 C), Min:102.7 F (39.3 C), Max:102.7 F (39.3 C)  No results for input(s): WBC, CREATININE, LATICACIDVEN, VANCOTROUGH, VANCOPEAK, VANCORANDOM, GENTTROUGH, GENTPEAK, GENTRANDOM, TOBRATROUGH, TOBRAPEAK, TOBRARND, AMIKACINPEAK, AMIKACINTROU, AMIKACIN in the last 168 hours.  CrCl cannot be calculated (Patient's most recent lab result is older than the maximum 21 days allowed.).     Antimicrobials this admission: 1/2 vancomycin > 1/2 zosyn >  Dose adjustments this admission: n/a  Microbiology results: 1/2 blood cx:    Baldemar FridayMasters, Keandre Linden M 08/11/2017 7:57 AM

## 2017-08-11 NOTE — ED Provider Notes (Signed)
[facility] EMERGENCY DEPARTMENT Provider Note   CSN: [id number] Arrival date & time: 08/11/17  7829     History   Chief Complaint Chief Complaint  Patient presents with  . Altered Mental Status    HPI Amanda [name] is a 72 y.o. female.  Patient with history of chronic kidney disease, dementia, nonverbal at baseline, full code --presents from [place] after not opening her eyes like she normally does.  Patient with rectal temperature to 102.7 degrees upon arrival to the emergency department.  No apparent skin rashes.  Level 5 caveat due to dementia, nonverbal.      Past Medical History:  Diagnosis Date  . Dementia    "alzheimer's" (05/18/2012)  . Fall 05/17/2012  . Fracture of medial wall of orbit (HCC) 05/17/2012  . Gout    "? feet" (05/18/2012); uric acid 9.4 on 08/06/16 with R wrist pain; 7.1 on 08/15/16 on Allopurinol  . Hypertension   . Incontinence of urine   . Stroke [facility]) ~ 2003   "slight memory loss" (05/18/2012)  . Wrist fracture, bilateral 05/17/2012   "fell down steps" (05/18/2012)    Patient Active Problem List   Diagnosis Date Noted  . Chronic constipation 09/17/2016  . History of gout 08/06/2016  . Anemia of chronic disease 10/08/2014  . Allergic rhinitis 11/24/2013  . Hyperlipidemia 02/27/2013  . Anxiety state 12/12/2012  . Vitamin D deficiency 12/12/2012  . CKD (chronic kidney disease) stage 3, GFR 30-59 ml/min (HCC) 05/17/2012  . Essential hypertension, benign   . Dementia with behavioral disturbance     Past Surgical History:  Procedure Laterality Date  . fractured tooth     01/06/17 Dr Ocie Doyne DMD extracted tooth  #4   . VAGINAL HYSTERECTOMY      OB History    No data available       Home Medications    Prior to Admission medications   Medication Sig Start Date End Date Taking? Authorizing Provider  acetaminophen (TYLENOL) 325 MG tablet Take 650 mg by mouth every 6 (six) hours as needed for moderate pain  or fever.    [provider]  allopurinol (ZYLOPRIM) 150 mg TABS tablet Take 150 mg by mouth daily.     [provider]  AMBULATORY NON FORMULARY MEDICATION Give 1 magic cup by mouth twice a day for supplement.    [provider]  atorvastatin (LIPITOR) 10 MG tablet Take 10 mg by mouth daily.    [provider]  CALCIUM-VITAMIN D PO Take 500 mg by mouth daily.    [provider]  cloNIDine (CATAPRES) 0.1 MG tablet Take 0.1-0.2 mg by mouth. Take 0.2 mg QAM, 0.1 mg QPM.  Hold for SBP <120    [provider]  Docusate Sodium (COLACE PO) Take 100 mg by mouth 2 (two) times daily.     [provider]  labetalol (NORMODYNE) 300 MG tablet Take 300 mg by mouth 2 (two) times daily.    [provider]  linaclotide (LINZESS) 72 MCG capsule Take 72 mcg by mouth every other day.    [provider]  polyethylene glycol (MIRALAX / GLYCOLAX) packet Take 17 g by mouth daily as needed.     [provider]    Family History Family History  Problem Relation Age of Onset  . Dementia Mother   . Dementia Father   . Dementia Sister   . Dementia Brother     Social History Social History  Tobacco Use  . Smoking status: Former Smoker    Types: Cigarettes  . Smokeless tobacco: Never Used  . Tobacco comment: [facility] reports that patient no longer smokes.   Substance Use Topics  . Alcohol use: No    Alcohol/week: 0.0 oz    Comment: [facility] that [facility] no longer drinks.   . Drug use: No     Allergies   Patient has no known allergies.   Review of Systems Review of Systems  Unable to perform ROS: Dementia     Physical Exam Updated Vital Signs BP 129/88 (BP Location: Right Arm)   Pulse (!) 113   Temp (!) 102.7 F (39.3 C) (Rectal)   Resp (!) 22   Ht 5\' 1"  (1.549 m)   Wt 61.2 kg (135 lb)   SpO2 96%   BMI 25.51 kg/m   Physical Exam  Constitutional: She appears  well-developed and well-nourished.  HENT:  Head: Normocephalic and atraumatic.  Right Ear: Tympanic membrane, external ear and ear canal normal.  Left Ear: Tympanic membrane, external ear and ear canal normal.  Nose: No mucosal edema or rhinorrhea.  Mouth/Throat: Mucous membranes are dry. No posterior oropharyngeal edema or posterior oropharyngeal erythema.  Very dry mucous membranes.  Eyes: Conjunctivae are normal. Right eye exhibits no discharge. Left eye exhibits no discharge.  Neck: Normal range of motion. Neck supple.  Cardiovascular: Regular rhythm and normal heart sounds. Tachycardia present.  Pulmonary/Chest: Effort normal and breath sounds normal. No stridor. No respiratory distress. She has no wheezes.  Abdominal: Soft. There is no tenderness. There is no guarding.  Musculoskeletal: She exhibits no edema.  Neurological:  Patient nonverbal.  She does not open her eyes.  Skin: Skin is warm and dry.  No obvious cellulitis, abscesses, ulcerations or skin breakdown noted on external exam.  Psychiatric:  Non-verbal.  Nursing note and vitals reviewed.    ED Treatments / Results  Labs (all labs ordered are listed, but only abnormal results are displayed) Labs Reviewed  COMPREHENSIVE METABOLIC PANEL - Abnormal; Notable for the following components:      Result Value   Sodium 158 (*)    Chloride 127 (*)    CO2 21 (*)    Glucose, Bld 110 (*)    BUN 47 (*)    Creatinine, Ser 2.16 (*)    Total Protein 8.3 (*)    GFR calc non Af Amer 21 (*)    GFR calc Af Amer 25 (*)    All other components within normal limits  CBC WITH DIFFERENTIAL/PLATELET - Abnormal; Notable for the following components:   WBC 12.3 (*)    Neutro Abs 9.5 (*)    All other components within normal limits  URINALYSIS, ROUTINE W REFLEX MICROSCOPIC - Abnormal; Notable for the following components:   APPearance HAZY (*)    Hgb urine dipstick LARGE (*)    Bacteria, UA RARE (*)    Squamous Epithelial / LPF 0-5  (*)    All other components within normal limits  CULTURE, BLOOD (ROUTINE X 2)  CULTURE, BLOOD (ROUTINE X 2)  RESPIRATORY PANEL BY PCR  PROTIME-INR  I-STAT CG4 LACTIC ACID, ED  I-STAT TROPONIN, ED    ED ECG REPORT   Date: 08/11/2017  Rate: 113  Rhythm: sinus tachycardia  QRS Axis: normal  Intervals: normal  ST/T Wave abnormalities: normal  Conduction Disutrbances:none  Narrative Interpretation: inferior Q-waves  Old EKG Reviewed: changes noted  I have personally reviewed the  EKG tracing and agree with the computerized printout as noted.   Radiology Dg Chest Portable 1 View  Result Date: 08/11/2017 CLINICAL DATA:  Sepsis EXAM: PORTABLE CHEST 1 VIEW COMPARISON:  05/17/2012 FINDINGS: Cardiac shadow is enlarged. Postsurgical changes are again noted. Tortuous vascularity is seen and stable. The lungs are clear bilaterally. No bony abnormality is seen. IMPRESSION: No acute abnormality noted. Electronically Signed   By: Alcide Clever M.D.   On: 08/11/2017 08:13    Procedures Procedures (including critical care time)  Medications Ordered in ED Medications  piperacillin-tazobactam (ZOSYN) IVPB 2.25 g (not administered)  vancomycin (VANCOCIN) IVPB 1000 mg/200 mL premix (not administered)  sodium chloride 0.9 % bolus 500 mL (not administered)  sodium chloride 0.9 % bolus 500 mL (0 mLs Intravenous Stopped 08/11/17 0853)  piperacillin-tazobactam (ZOSYN) IVPB 3.375 g (0 g Intravenous Stopped 08/11/17 0854)  vancomycin (VANCOCIN) 1,250 mg in sodium chloride 0.9 % 250 mL IVPB (1,250 mg Intravenous New Bag/Given 08/11/17 0831)  acetaminophen (TYLENOL) suppository 650 mg (650 mg Rectal Given 08/11/17 0822)  sodium chloride 0.9 % bolus 500 mL (500 mLs Intravenous New Bag/Given 08/11/17 0929)     Initial Impression / Assessment and Plan / ED Course  I have reviewed the triage vital signs and the nursing notes.  Pertinent labs & imaging results that were available during my care of the patient were  reviewed by me and considered in my medical decision making (see chart for details).     Patient seen and examined. Work-up initiated. Medications ordered.  Currently patient normotensive.  Awaiting lactate.  Will give small fluid bolus.  Vital signs reviewed and are as follows: BP 129/88 (BP Location: Right Arm)   Pulse (!) 113   Temp (!) 102.7 F (39.3 C) (Rectal)   Resp (!) 22   Ht 5\' 1"  (1.549 m)   Wt 61.2 kg (135 lb)   SpO2 96%   BMI 25.51 kg/m   8:09 AM Lactate normal. Will hydrate gently given CKD and age.   8:58 AM Sodium and AKI noted, additional hydration ordered. Abx being administered. Awaiting for labs.   9:35 AM Pt stable. Spoke with husband and caregiver at bedside. Pt typically up in chair at facility. She recognizes family but cannot talk 2/2 dementia. At [facility] for [facility] years. No recent hospitalizations.   10:21 AM UA not suggestive of infection. Respiratory panel ordered. Unclear source at this time.   10:35 AM Spoke with Gwynneth Munson PA-C who will admit.   CRITICAL CARE Performed by: Carolee Rota Total critical care time: 35 minutes Critical care time was exclusive of separately billable procedures and treating other patients. Critical care was necessary to treat or prevent imminent or life-threatening deterioration. Critical care was time spent personally by me on the following activities: development of treatment plan with patient and/or surrogate as well as nursing, discussions with consultants, evaluation of patient's response to treatment, examination of patient, obtaining history from patient or surrogate, ordering and performing treatments and interventions, ordering and review of laboratory studies, ordering and review of radiographic studies, pulse oximetry and re-evaluation of patient's condition.   Final Clinical Impressions(s) / ED Diagnoses   Final diagnoses:  Sepsis, due to unspecified organism (HCC)  Acute hypernatremia  Acute kidney injury  (HCC)   Admit.    ED Discharge Orders    None       Renne Crigler, Cordelia Poche 08/11/17 1037    Margarita Grizzle, MD 08/11/17 [phone]

## 2017-08-11 NOTE — H&P (Signed)
History and Physical    Amanda [name] ZOX:[id number] DOB: [birth year] DOA: 08/11/2017   PCP: Pecola Lawless, MD   Patient coming from: Nursing home [facility])  Chief Complaint: Increased confusion  HPI: Amanda [name] is a 72 y.o. female with medical history significant for CKD, gout, history of CVA in 2013, frequent falls, hypertension, hyperlipidemia, chronic constipation, Alzheimer's dementia, nonverbal at baseline, brought to the ED for evaluation, as this morning, she was not opening her eyes, and appeared increasingly less responsive.  The patient is able to provide history, due to level 5 caveat due to confusion and dementia.  Son is at bedside, who reports that his mother usually is able to interact with him in a simple basis, but today, she was unable to do so.  He is not aware of any recent infections.  He did report that she looked more febrile over the last 24 hours, and this morning, her temperature was 102.7.  He is not aware of any respiratory or cardiac issues.  He is not aware of any sick contacts.  He does report that his mother had significant decrease in appetite for the last 3 days, for solids and liquids.  She is usually in a pured food regimen, but she was showing failure to thrive.  Other history is unknown, as the son has limited knowledge of series of events.  ED Course:  BP 137/82   Pulse 86   Temp (!) 102.7 F (39.3 C) (Rectal)   Resp 13   Ht 5\' 1"  (1.549 m)   Wt 61.2 kg (135 lb)   SpO2 99%   BMI 25.51 kg/m   She was found to have elevated sodium levels at 158 in the setting of dehydration.  Potassium was normal at 3.6.  Chloride was 127.  Glucose is 110 Lactate is normal, influenza panel is pending White count 12.3 Hemoglobin normal at 13.6, platelets 285. Troponin 0.02, lactic acid 1.25, blood cultures pending.   Chest x-ray without acute abnormalities found  Urinalysis negative for leukocytes or nitrites EKG sinus tachycardia without acute  findings Creatinine 2.16, GFR 25 PT 14.6, INR 1.15  given Vanco and Zosyn Given 500 cc IV fluid normal saline x3    Review of Systems:  As per HPI otherwise all other systems reviewed and are negative  Past Medical History:  Diagnosis Date  . Dementia    "alzheimer's" (05/18/2012)  . Fall 05/17/2012  . Fracture of medial wall of orbit (HCC) 05/17/2012  . Gout    "? feet" (05/18/2012); uric acid 9.4 on 08/06/16 with R wrist pain; 7.1 on 08/15/16 on Allopurinol  . Hypertension   . Incontinence of urine   . Stroke [facility]) ~ 2003   "slight memory loss" (05/18/2012)  . Wrist fracture, bilateral 05/17/2012   "fell down steps" (05/18/2012)    Past Surgical History:  Procedure Laterality Date  . fractured tooth     01/06/17 Dr Ocie Doyne DMD extracted tooth  #4   . VAGINAL HYSTERECTOMY      Social History Social History   Socioeconomic History  . Marital status: Married    Spouse name: Not on file  . Number of children: Not on file  . Years of education: Not on file  . Highest education level: Not on file  Social Needs  . Financial resource strain: Not on file  . Food insecurity - worry: Not on file  . Food insecurity - inability: Not on file  . Transportation needs -  medical: Not on file  . Transportation needs - non-medical: Not on file  Occupational History  . Not on file  Tobacco Use  . Smoking status: Former Smoker    Types: Cigarettes  . Smokeless tobacco: Never Used  . Tobacco comment: Staff at [facility] reports that patient no longer smokes.   Substance and Sexual Activity  . Alcohol use: No    Alcohol/week: 0.0 oz    Comment: Staff at [facility] reports that patient no longer drinks.   . Drug use: No  . Sexual activity: Not Currently  Other Topics Concern  . Not on file  Social History Narrative  . Not on file     No Known Allergies  Family History  Problem Relation Age of Onset  . Dementia Mother   . Dementia Father   . Dementia Sister   . Dementia  Brother       Prior to Admission medications   Medication Sig Start Date End Date Taking? Authorizing Provider  acetaminophen (TYLENOL) 325 MG tablet Take 650 mg by mouth every 6 (six) hours as needed for moderate pain or fever.    [provider]  allopurinol (ZYLOPRIM) 150 mg TABS tablet Take 150 mg by mouth daily.     [provider]  AMBULATORY NON FORMULARY MEDICATION Give 1 magic cup by mouth twice a day for supplement.    [provider]  atorvastatin (LIPITOR) 10 MG tablet Take 10 mg by mouth daily.    [provider]  CALCIUM-VITAMIN D PO Take 500 mg by mouth daily.    [provider]  cloNIDine (CATAPRES) 0.1 MG tablet Take 0.1-0.2 mg by mouth. Take 0.2 mg QAM, 0.1 mg QPM.  Hold for SBP <120    [provider]  Docusate Sodium (COLACE PO) Take 100 mg by mouth 2 (two) times daily.     [provider]  labetalol (NORMODYNE) 300 MG tablet Take 300 mg by mouth 2 (two) times daily.    [provider]  linaclotide (LINZESS) 72 MCG capsule Take 72 mcg by mouth every other day.    [provider]  polyethylene glycol (MIRALAX / GLYCOLAX) packet Take 17 g by mouth daily as needed.     [provider]    Physical Exam:  Vitals:   08/11/17 0930 08/11/17 0945 08/11/17 1000 08/11/17 1015  BP: 140/78 133/89 134/83 137/82  Pulse: 89 94 89 86  Resp: 19 14 18 13   Temp:      TempSrc:      SpO2: 98% 97% 96% 99%  Weight:      Height:       Constitutional: Ill appearing, eyes are open, moaning. Eyes: PERRL, lids and conjunctivae normal ENMT: Mucous membranes are dry, without exudate or lesions  Neck: normal, supple, no masses, no thyromegaly Respiratory: Essentially clear to auscultation bilaterally, no wheezing, no crackles. Normal respiratory effort  Cardiovascular: Regular rate and rhythm, very soft 1 out of 6 murmur, rubs or gallops. No extremity edema. 2+ pedal pulses. No carotid bruits.  Abdomen:  Soft, non tender, No hepatosplenomegaly. Bowel sounds positive.  Musculoskeletal: no clubbing / cyanosis. Moves all extremities Skin: no jaundice, No lesions.  Neurologic: Sensation appears intact  Strength unable to be tested, the patient can follow commands.     Labs on Admission: I have personally reviewed following labs and imaging studies  CBC: Recent Labs  Lab 08/11/17 0736  WBC 12.3*  NEUTROABS 9.5*  HGB 13.6  HCT 44.3  MCV 98.7  PLT 285    Basic Metabolic Panel: Recent Labs  Lab 08/11/17 0736  NA 158*  K 3.6  CL 127*  CO2 21*  GLUCOSE 110*  BUN 47*  CREATININE 2.16*  CALCIUM 9.8    GFR: Estimated Creatinine Clearance: 19.5 mL/min (A) (by C-G formula based on SCr of 2.16 mg/dL (H)).  Liver Function Tests: Recent Labs  Lab 08/11/17 0736  AST 18  ALT 15  ALKPHOS 77  BILITOT 0.6  PROT 8.3*  ALBUMIN 3.7   No results for input(s): LIPASE, AMYLASE in the last 168 hours. No results for input(s): AMMONIA in the last 168 hours.  Coagulation Profile: Recent Labs  Lab 08/11/17 0736  INR 1.15    Cardiac Enzymes: No results for input(s): CKTOTAL, CKMB, CKMBINDEX, TROPONINI in the last 168 hours.  BNP (last 3 results) No results for input(s): PROBNP in the last 8760 hours.  HbA1C: No results for input(s): HGBA1C in the last 72 hours.  CBG: No results for input(s): GLUCAP in the last 168 hours.  Lipid Profile: No results for input(s): CHOL, HDL, LDLCALC, TRIG, CHOLHDL, LDLDIRECT in the last 72 hours.  Thyroid Function Tests: No results for input(s): TSH, T4TOTAL, FREET4, T3FREE, THYROIDAB in the last 72 hours.  Anemia Panel: No results for input(s): VITAMINB12, FOLATE, FERRITIN, TIBC, IRON, RETICCTPCT in the last 72 hours.  Urine analysis:    Component Value Date/Time   COLORURINE YELLOW 08/11/2017 0733   APPEARANCEUR HAZY (A) 08/11/2017 0733   LABSPEC 1.023 08/11/2017 0733   PHURINE 5.0 08/11/2017 0733   GLUCOSEU NEGATIVE 08/11/2017  0733   HGBUR LARGE (A) 08/11/2017 0733   BILIRUBINUR NEGATIVE 08/11/2017 0733   KETONESUR NEGATIVE 08/11/2017 0733   PROTEINUR NEGATIVE 08/11/2017 0733   UROBILINOGEN 0.2 05/17/2012 1424   NITRITE NEGATIVE 08/11/2017 0733   LEUKOCYTESUR NEGATIVE 08/11/2017 0733    Sepsis Labs: @LABRCNTIP (procalcitonin:4,lacticidven:4) )No results found for this or any previous visit (from the past 240 hour(s)).   Radiological Exams on Admission: Dg Chest Portable 1 View  Result Date: 08/11/2017 CLINICAL DATA:  Sepsis EXAM: PORTABLE CHEST 1 VIEW COMPARISON:  05/17/2012 FINDINGS: Cardiac shadow is enlarged. Postsurgical changes are again noted. Tortuous vascularity is seen and stable. The lungs are clear bilaterally. No bony abnormality is seen. IMPRESSION: No acute abnormality noted. Electronically Signed   By: Alcide CleverMark  Lukens M.D.   On: 08/11/2017 08:13    EKG: Independently reviewed.  Assessment/Plan Active Problems:   Essential hypertension, benign   Dementia with behavioral disturbance   CKD (chronic kidney disease) stage 3, GFR 30-59 ml/min (HCC)   Anxiety state   Vitamin D deficiency   Hyperlipidemia   Anemia of chronic disease   History of gout   Chronic constipation   History of stroke   Acute confusional state   Dehydration with hypernatremia    Sepsis of unknown organism or source  Patient meets criteria given initial  tachycardia, tachypnea, fever up to 102.7 , leukocytosis WBC 12 . Lactic acid is normal. UA neg for nitrites and leukocytes  . CXR NAD Antibiotics delivered in the ED.with Vanc and Zosyn . She also received total 1.5 L IVF  Admit to SDU Sepsis order set  IV antibiotics by pharmacy with Vanc and Zosyn  Follow lactic acid q 3 hrs Follow blood cultures  IV fluids at 100 cc/h.  Procalcitonin per sepsis order set  Repeat CBC in am   Await Influenza panel results  antipyretics prn   Hypernatremia in the  setting of dehydration sodium on adm is 158  Cr  on admission 2.16    . Receiving IVF as above, total of 1.5 l  Continue IVF at 100 cc/h as per Sodium correction rate scale  Monitor BMET x2   Check a.m. CMET Check magnesium and phosphorus    Hypertension BP  154/97   Pulse 80   Continue home anti-hypertensive medications  Add Hydralazine Q8 hours as needed for BP>180   Hyperlipidemia Continue home statins    Acute on Chronic kidney disease stage 3 , currently stage 4  Creatinine 2.16, GFR 25, BL 1.5  Lab Results  Component Value Date   CREATININE 2.16 (H) 08/11/2017   CREATININE 1.5 (A) 12/17/2016   CREATININE 1.6 (A) 08/15/2016  IVF Repeat CMET in am   Gout, no acute flare Continue Allopurinol  Chronic constipation Continue Laxatives prn   Deconditioning PT/OT evaluation  Consult to dietitian, patient eats pureed foods, currently n.p.o., due to the status changes, will need their input, to determine the proper diet for this patient.   DVT prophylaxis:   Heparin in view of acute on chronic KD  Code Status:     Family Communication:  Discussed with patient Disposition Plan: Expect patient to be discharged to home after condition improves Consults called:   PT and OT, Dietitian to help in proper diet  Admission status: SDU   Marlowe Kays, PA-C [facility]   08/11/2017, 10:49 AM

## 2017-08-12 DIAGNOSIS — L89102 Pressure ulcer of unspecified part of back, stage 2: Secondary | ICD-10-CM

## 2017-08-12 LAB — CBC
HCT: 45.4 % (ref 36.0–46.0)
Hemoglobin: 13.9 g/dL (ref 12.0–15.0)
MCH: 30.5 pg (ref 26.0–34.0)
MCHC: 30.6 g/dL (ref 30.0–36.0)
MCV: 99.6 fL (ref 78.0–100.0)
PLATELETS: 243 10*3/uL (ref 150–400)
RBC: 4.56 MIL/uL (ref 3.87–5.11)
RDW: 14.2 % (ref 11.5–15.5)
WBC: 10.7 10*3/uL — AB (ref 4.0–10.5)

## 2017-08-12 LAB — COMPREHENSIVE METABOLIC PANEL
ALT: 12 U/L — AB (ref 14–54)
AST: 16 U/L (ref 15–41)
Albumin: 3.2 g/dL — ABNORMAL LOW (ref 3.5–5.0)
Alkaline Phosphatase: 71 U/L (ref 38–126)
BILIRUBIN TOTAL: 1 mg/dL (ref 0.3–1.2)
BUN: 33 mg/dL — ABNORMAL HIGH (ref 6–20)
CALCIUM: 9 mg/dL (ref 8.9–10.3)
CO2: 15 mmol/L — AB (ref 22–32)
CREATININE: 1.66 mg/dL — AB (ref 0.44–1.00)
Chloride: >130 mmol/L (ref 101–111)
GFR, EST AFRICAN AMERICAN: 34 mL/min — AB (ref >60)
GFR, EST NON AFRICAN AMERICAN: 30 mL/min — AB (ref >60)
Glucose, Bld: 91 mg/dL (ref 65–99)
Potassium: 3.9 mmol/L (ref 3.5–5.1)
Sodium: 156 mmol/L — ABNORMAL HIGH (ref 135–145)
Total Protein: 7.1 g/dL (ref 6.5–8.1)

## 2017-08-12 LAB — MRSA PCR SCREENING: MRSA BY PCR: NEGATIVE

## 2017-08-12 LAB — URINE CULTURE: CULTURE: NO GROWTH

## 2017-08-12 LAB — PROTIME-INR
INR: 1.16
PROTHROMBIN TIME: 14.7 s (ref 11.4–15.2)

## 2017-08-12 MED ORDER — CLONIDINE HCL 0.3 MG/24HR TD PTWK
0.3000 mg | MEDICATED_PATCH | TRANSDERMAL | Status: DC
  Administered 2017-08-12: 0.3 mg via TRANSDERMAL
  Filled 2017-08-12: qty 1

## 2017-08-12 MED ORDER — DEXTROSE-NACL 5-0.45 % IV SOLN
INTRAVENOUS | Status: DC
  Administered 2017-08-12 – 2017-08-16 (×7): via INTRAVENOUS

## 2017-08-12 MED ORDER — ACETAMINOPHEN 650 MG RE SUPP
325.0000 mg | Freq: Four times a day (QID) | RECTAL | Status: DC | PRN
  Administered 2017-08-15 – 2017-08-17 (×2): 325 mg via RECTAL
  Filled 2017-08-12 (×2): qty 1

## 2017-08-12 MED ORDER — HYDRALAZINE HCL 20 MG/ML IJ SOLN
5.0000 mg | Freq: Three times a day (TID) | INTRAMUSCULAR | Status: DC | PRN
  Administered 2017-08-14: 5 mg via INTRAVENOUS
  Administered 2017-08-15: 10 mg via INTRAVENOUS
  Administered 2017-08-16: 5 mg via INTRAVENOUS
  Filled 2017-08-12 (×3): qty 1

## 2017-08-12 MED ORDER — LABETALOL HCL 5 MG/ML IV SOLN: 5.0000 mg | INTRAVENOUS | Status: DC | PRN

## 2017-08-12 NOTE — Progress Notes (Signed)
OT Cancellation Note and Discharge  Patient Details Name: Amanda [name] MRN: [id number] DOB: 11-19-1945   Cancelled Treatment:    Reason Eval/Treat Not Completed: OT screened, no needs identified, will sign off. Per family, pt is from SNF where a lift was used to transfer her to chair, and is dependent in ADL. OT signing off, recommend nursing transfer with lift equipment for safety.  Amanda [name] 08/12/2017, 9:35 AM  Amanda [name] Start OTR/L [phone]

## 2017-08-12 NOTE — Evaluation (Signed)
Clinical/Bedside Swallow Evaluation Patient Details  Name: Amanda [name] MRN: [id number] Date of Birth: 1946/07/07  Today's Date: 08/12/2017 Time: SLP Start Time (ACUTE ONLY): 0845 SLP Stop Time (ACUTE ONLY): 0901 SLP Time Calculation (min) (ACUTE ONLY): 16 min  Past Medical History:  Past Medical History:  Diagnosis Date  . Dementia    "alzheimer's" (05/18/2012)  . Fall 05/17/2012  . Fracture of medial wall of orbit (HCC) 05/17/2012  . Gout    "? feet" (05/18/2012); uric acid 9.4 on 08/06/16 with R wrist pain; 7.1 on 08/15/16 on Allopurinol  . Hypertension   . Incontinence of urine   . Stroke [facility](HCC) ~ 2003   "slight memory loss" (05/18/2012)  . Wrist fracture, bilateral 05/17/2012   "fell down steps" (05/18/2012)   Past Surgical History:  Past Surgical History:  Procedure Laterality Date  . fractured tooth     01/06/17 Dr Ocie DoyneScott Jensen DMD extracted tooth  #4   . VAGINAL HYSTERECTOMY     HPI:  72 y.o. female with medical history significant for CKD, gout, history of CVA in 2013, frequent falls, hypertension, hyperlipidemia, chronic constipation, Alzheimer's dementia, nonverbal at baseline, brought to the ED for evaluation, as this morning, she was not opening her eyes, and appeared increasingly less responsive.  The patient is able to provide history, due to level 5 caveat due to confusion and dementia.  Son is at bedside, who reports that his mother usually is able to interact with him in a simple basis, but today, she was unable to do so.  He is not aware of any recent infections.  He did report that she looked more febrile over the last 24 hours, and this morning, her temperature was 102.7.  He is not aware of any respiratory or cardiac issues.  He is not aware of any sick contacts.  He does report that his mother had significant decrease in appetite for the last 3 days, for solids and liquids.  She is usually in a pured food regimen, but she was showing failure to thrive.  Other  history is unknown, as the son has limited knowledge of series of events.   Assessment / Plan / Recommendation Clinical Impression   Pt presents with oropharyngeal dysphagia characterized by decreased oral awareness, inability to initiate a swallow/cough on command (likely d/t cognitive status), and decreased lingual/labial manipulation of bolus presented (ice chips) with eventual expulsion from oral cavity required; pt provided with oral care prior to attempt with oral intake; oral care limited d/t pt's cognitive status and clenching/grimacing when swab entered oral cavity despite max verbal/visual/tactile cueing and encouragement provided by SLP.  Recommend NPO status at this time with oral care QID; ST will f/u for PO readiness as pt's LOA improves while in acute setting. SLP Visit Diagnosis: Dysphagia, oropharyngeal phase (R13.12)    Aspiration Risk  Severe aspiration risk;Risk for inadequate nutrition/hydration    Diet Recommendation   NPO  Medication Administration: Via alternative means    Other  Recommendations Oral Care Recommendations: Oral care QID   Follow up Recommendations Skilled Nursing facility      Frequency and Duration min 3x week  1 week       Prognosis Prognosis for Safe Diet Advancement: Fair Barriers to Reach Goals: Cognitive deficits;Severity of deficits;Behavior      Swallow Study   General Date of Onset: 08/11/17 HPI: 72 y.o. female with medical history significant for CKD, gout, history of CVA in 2013, frequent falls, hypertension, hyperlipidemia, chronic constipation, Alzheimer's  dementia, nonverbal at baseline, brought to the ED for evaluation, as this morning, she was not opening her eyes, and appeared increasingly less responsive.  The patient is able to provide history, due to level 5 caveat due to confusion and dementia.  Son is at bedside, who reports that his mother usually is able to interact with him in a simple basis, but today, she was unable to  do so.  He is not aware of any recent infections.  He did report that she looked more febrile over the last 24 hours, and this morning, her temperature was 102.7.  He is not aware of any respiratory or cardiac issues.  He is not aware of any sick contacts.  He does report that his mother had significant decrease in appetite for the last 3 days, for solids and liquids.  She is usually in a pured food regimen, but she was showing failure to thrive.  Other history is unknown, as the son has limited knowledge of series of events. Type of Study: Bedside Swallow Evaluation Previous Swallow Assessment: In ED, BSE ordered d/t hx of dysphagia Diet Prior to this Study: NPO Temperature Spikes Noted: Yes Respiratory Status: Room air History of Recent Intubation: No Behavior/Cognition: Confused;Agitated;Lethargic/Drowsy Oral Cavity Assessment: Dry Oral Care Completed by SLP: Other (Comment)(partial d/t pt agitation/lethargy) Oral Cavity - Dentition: Adequate natural dentition;Poor condition Self-Feeding Abilities: Needs assist;Needs set up Patient Positioning: Upright in bed;Other (comment)(Pt became agitated when elevated (likely d/t pain?)) Baseline Vocal Quality: Not observed Volitional Cough: Cognitively unable to elicit Volitional Swallow: Unable to elicit    Oral/Motor/Sensory Function Overall Oral Motor/Sensory Function: Generalized oral weakness   Ice Chips Ice chips: Impaired Presentation: Spoon Oral Phase Impairments: Reduced labial seal;Reduced lingual movement/coordination;Poor awareness of bolus Oral Phase Functional Implications: Other (comment)(Pt became agitated when ice chips entered oral cavity) Other Comments: Decreased awareness of bolus/agitation with ice chips/expelled from oral cavity   Thin Liquid Thin Liquid: Not tested    Nectar Thick Nectar Thick Liquid: Not tested   Honey Thick Honey Thick Liquid: Not tested   Puree Puree: Not tested   Solid      Solid: Not tested         Tressie Stalker, M.S., CCC-SLP 08/12/2017,9:14 AM

## 2017-08-12 NOTE — NC FL2 (Signed)
[place] MEDICAID FL2 LEVEL OF CARE SCREENING TOOL     IDENTIFICATION  Patient Name: Amanda [name] Birthdate: 1946/03/30 Sex: female Admission Date (Current Location): 08/11/2017  [facility] [place]   Facility and Address:  [facility] [place], [address], [place], [place] [address]      Provider Number: [id number]  Attending Physician Name and Address:  Noralee Stainhoi, Jennifer, DO  Relative Name and Phone Number:  [name], spouse, [phone]    Current Level of Care: Hospital Recommended Level of Care: Skilled Nursing Facility Prior Approval Number:    Date Approved/Denied:   PASRR Number:    Discharge Plan: SNF    Current Diagnoses: Patient Active Problem List   Diagnosis Date Noted  . Pressure injury of skin 08/12/2017  . History of stroke 08/11/2017  . Acute confusional state 08/11/2017  . Dehydration with hypernatremia 08/11/2017  . Sepsis (HCC) 08/11/2017  . Chronic constipation 09/17/2016  . History of gout 08/06/2016  . Anemia of chronic disease 10/08/2014  . Allergic rhinitis 11/24/2013  . Hyperlipidemia 02/27/2013  . Anxiety state 12/12/2012  . Vitamin D deficiency 12/12/2012  . CKD (chronic kidney disease) stage 3, GFR 30-59 ml/min (HCC) 05/17/2012  . Essential hypertension, benign   . Dementia with behavioral disturbance     Orientation RESPIRATION BLADDER Height & Weight     (Disoriented x4)  Normal Incontinent, External catheter Weight: 61.2 kg (135 lb) Height:  5\' 1"  (154.9 cm)  BEHAVIORAL SYMPTOMS/MOOD NEUROLOGICAL BOWEL NUTRITION STATUS      Continent Diet(Please see DC Summary)  AMBULATORY STATUS COMMUNICATION OF NEEDS Skin   Extensive Assist Verbally PU Stage and Appropriate Care(Stage II on coccyx)                       Personal Care Assistance Level of Assistance  Bathing, Feeding, Dressing Bathing Assistance: Maximum assistance Feeding assistance: Limited assistance Dressing  Assistance: Maximum assistance     Functional Limitations Info             SPECIAL CARE FACTORS FREQUENCY                       Contractures      Additional Factors Info  Code Status, Allergies Code Status Info: Full Allergies Info: Pollen Extract           Current Medications (08/12/2017):  This is the current hospital active medication list Current Facility-Administered Medications  Medication Dose Route Frequency Provider Last Rate Last Dose  . acetaminophen (TYLENOL) suppository 325 mg  325 mg Rectal Q6H PRN Noralee Stainhoi, Jennifer, DO      . bisacodyl (DULCOLAX) suppository 10 mg  10 mg Rectal Daily PRN Marcos EkeWertman, Sara E, PA-C      . cloNIDine (CATAPRES - Dosed in mg/24 hr) patch 0.3 mg  0.3 mg Transdermal Weekly Noralee Stainhoi, Jennifer, DO   0.3 mg at 08/12/17 1342  . dextrose 5 %-0.45 % sodium chloride infusion   Intravenous Continuous Noralee Stainhoi, Jennifer, DO 100 mL/hr at 08/12/17 1106    . heparin injection 5,000 Units  5,000 Units Subcutaneous Q8H Marcos EkeWertman, Sara E, PA-C   5,000 Units at 08/12/17 1342  . hydrALAZINE (APRESOLINE) injection 5-10 mg  5-10 mg Intravenous Q8H PRN Noralee Stainhoi, Jennifer, DO      . labetalol (NORMODYNE,TRANDATE) injection 5 mg  5 mg Intravenous Q10 min PRN Noralee Stainhoi, Jennifer, DO      . ondansetron [facility](ZOFRAN) injection 4 mg  4 mg Intravenous Q6H PRN Marcos Eke, PA-C      . piperacillin-tazobactam (ZOSYN) IVPB 3.375 g  3.375 g Intravenous Q8H Earnie Larsson, RPH 12.5 mL/hr at 08/12/17 1341 3.375 g at 08/12/17 1341     Discharge Medications: Please see discharge summary for a list of discharge medications.  Relevant Imaging Results:  Relevant Lab Results:   Additional Information SSN: [id number] [place], [place]

## 2017-08-12 NOTE — Progress Notes (Signed)
Pharmacy Antibiotic Note  Amanda [name] is a 72 y.o. female admitted on 08/11/2017 with decreased responsiveness and fever.  Today is day #2 of antibiotics for presumed aspiration pna.  Renal function has improved with hydration.  Will adjust dose.  Plan: Change Zosyn 3.375 g IV q8h Monitor renal fx, cultures  Height: 5\' 1"  (154.9 cm) Weight: 135 lb (61.2 kg) IBW/kg (Calculated) : 47.8  Temp (24hrs), Avg:98.7 F (37.1 C), Min:98.4 F (36.9 C), Max:99 F (37.2 C)  Recent Labs  Lab 08/11/17 0736 08/11/17 0755 08/11/17 1349 08/11/17 1843 08/12/17 0249  WBC 12.3*  --   --   --  10.7*  CREATININE 2.16*  --  1.95* 1.78* 1.66*  LATICACIDVEN  --  1.25 1.3  --   --     Estimated Creatinine Clearance: 25.3 mL/min (A) (by C-G formula based on SCr of 1.66 mg/dL (H)).     Antimicrobials this admission: 1/2 vancomycin > 1/3 1/2 zosyn >  Dose adjustments this admission: 1/3 Zosyn dose increased with improving renal fxn  Microbiology results: 1/2 blood cx: ngtd 1/2 MRSA PCR negative 1/2 resp panel negative 1/2 Ucx negative  Arasely Akkerman, Pharm.D., BCPS Clinical Pharmacist Pager: [phone] Clinical phone for 08/12/2017 from 8:30-4:00 is [phone]. After 4pm, please call Main Rx [phone]) for assistance. 08/12/2017 1:32 PM

## 2017-08-12 NOTE — Progress Notes (Signed)
PT Cancellation Note  Patient Details Name: Amanda [name] MRN: [id number] DOB: June 20, 1946   Cancelled Treatment:    Reason Eval/Treat Not Completed: PT screened, no needs identified, will sign off(per family, pt is from SNF where a lift was used to transfer her to chair. She has been non ambulatory for years. PT signing off, recommend nursing transfer pt to recliner with lift. )   Tamala SerUhlenberg, [place] 08/12/2017, 9:32 AM [phone]

## 2017-08-12 NOTE — Progress Notes (Addendum)
PROGRESS NOTE    Maven FAE BLOSSOM  ZOX:[id number] DOB: [birth year] DOA: 08/11/2017 PCP: Pecola Lawless, MD     Brief Narrative:  JACELYN CUEN is a 72 yo female with past medical history significant for CKD, gout, history of CVA in 2013, frequent falls, hypertension, hyperlipidemia, chronic constipation, Alzheimer's dementia, nonverbal at baseline, nonambulatory at baseline who was brought to the ED for evaluation due to decreased responsiveness. Per husband at bedside, patient does not speak and does not walk. [facility] at [facility] chron[facility]. She had a fever and has not been eating much over the past week. She was admitted for sepsis due to suspected aspiration pneumonia, dehydration and hypernatremia.   Assessment & Plan:   Active Problems:   Essential hypertension, benign   Dementia with behavioral disturbance   CKD (chronic kidney disease) stage 3, GFR 30-59 ml/min (HCC)   Anxiety state   Vitamin D deficiency   Hyperlipidemia   Anemia of chronic disease   History of gout   Chronic constipation   History of stroke   Acute confusional state   Dehydration with hypernatremia   Sepsis (HCC)   Pressure injury of skin   Sepsis secondary to suspected aspiration pneumonia -Upon presentation, patient was tachycardic, tachypneic, fever of 102.7, leukocytosis WBC 12.3.  Initial chest x-ray was unremarkable although patient was quite dehydrated on admission. -Procalcitonin 0.38  -Respiratory PCR negative -Plan to repeat chest x-ray once patient is adequately fluid resuscitated  -Continue IV Zosyn, DC Vanco as MRSA PCR negative -NPO due to aspiration risk, speech therapy following   Hypernatremia in setting of decreased oral intake and dehydration -Change IV fluid to D5 half-normal saline -Trend BMP  AKI on CKD stage III -Baseline creatinine 1.5 -Continue IV fluid  Essential hypertension -Switch oral hypertensive to catapres patch due to NPO. Labetalol, hydralazine IVF  prn   Hyperlipidemia -Hold Lipitor due to NPO   Aspiration risk -SLP eval. Severe aspiration risk. NPO   Pressure ulcer, coccyx stage 2, POA -Wound RN consult   Dementia -Nonverbal at baseline   Goals of care -Patient is quite debilitated at baseline with dementia.  Patient has bilateral upper extremity contracture, does not ambulate, nonverbal at baseline.  She now has severe dysphagia and remains nothing by mouth.  Patient remains a full code, this was discussed with husband at bedside.  He states that he would like [place] to try interventions to revive if necessary.  We discussed the poor outcome and poor quality of life if that were to happen.  No change in CODE STATUS currently.  Palliative care medicine consulted.   DVT prophylaxis: subq hep  Code Status: FULL Family Communication: Husband at bedside Disposition Plan: Pending improvement, back to SNF   Consultants:   Palliative care  Procedures:   None   Antimicrobials:  Anti-infectives (From admission, onward)   Start     Dose/Rate Route Frequency Ordered Stop   08/13/17 0800  vancomycin (VANCOCIN) IVPB 1000 mg/200 mL premix  Status:  Discontinued     1,000 mg 200 mL/hr over 60 Minutes Intravenous Every 48 hours 08/11/17 0904 08/12/17 1242   08/11/17 2200  piperacillin-tazobactam (ZOSYN) IVPB 3.375 g     3.375 g 12.5 mL/hr over 240 Minutes Intravenous Every 8 hours 08/11/17 2142     08/11/17 1400  piperacillin-tazobactam (ZOSYN) IVPB 2.25 g  Status:  Discontinued     2.25 g 100 mL/hr over 30 Minutes Intravenous Every 8 hours 08/11/17 0904 08/11/17 2142   08/11/17  0800  piperacillin-tazobactam (ZOSYN) IVPB 3.375 g     3.375 g 100 mL/hr over 30 Minutes Intravenous  Once 08/11/17 0754 08/11/17 0854   08/11/17 0800  vancomycin (VANCOCIN) IVPB 1000 mg/200 mL premix  Status:  Discontinued     1,000 mg 200 mL/hr over 60 Minutes Intravenous  Once 08/11/17 0754 08/11/17 0756   08/11/17 0800  vancomycin (VANCOCIN) 1,250 mg  in sodium chloride 0.9 % 250 mL IVPB     1,250 mg 166.7 mL/hr over 90 Minutes Intravenous  Once 08/11/17 0756 08/11/17 1127       Subjective: Patient nonverbal.   Objective: Vitals:   08/12/17 0215 08/12/17 0418 08/12/17 0444 08/12/17 0720  BP: (!) 162/124 (!) 159/87    Pulse: 85 82 87 90  Resp: 16 16 16 19   Temp:   98.4 F (36.9 C) 98.4 F (36.9 C)  TempSrc:   Axillary Axillary  SpO2: 98% 98% 99% 98%  Weight:      Height:        Intake/Output Summary (Last 24 hours) at 08/12/2017 1246 Last data filed at 08/12/2017 0659 Gross per 24 hour  Intake 1470 ml  Output 400 ml  Net 1070 ml   Filed Weights   08/11/17 0731  Weight: 61.2 kg (135 lb)    Examination:  General exam: Appears calm Respiratory system: Clear to auscultation anteriorly  Cardiovascular system: S1 & S2 heard, tachycardic, regular rhythm. No JVD, murmurs, rubs, gallops or clicks. No pedal edema. Gastrointestinal system: Abdomen is nondistended, soft and nontender. No organomegaly or masses felt. Normal bowel sounds heard. Central nervous system: Alert. Nonverbal, noninteractive.  Extremities: Bilateral upper extremity contractures  Skin: +stage 2 coccyx pressure wound, nondraining  Psychiatry: +Dementia   Data Reviewed: I have personally reviewed following labs and imaging studies  CBC: Recent Labs  Lab 08/11/17 0736 08/12/17 0249  WBC 12.3* 10.7*  NEUTROABS 9.5*  --   HGB 13.6 13.9  HCT 44.3 45.4  MCV 98.7 99.6  PLT 285 243   Basic Metabolic Panel: Recent Labs  Lab 08/11/17 0736 08/11/17 1349 08/11/17 1843 08/12/17 0249  NA 158* 156* 156* 156*  K 3.6 3.5 3.6 3.9  CL 127* 128* 129* >130*  CO2 21* 20* 18* 15*  GLUCOSE 110* 97 84 91  BUN 47* 40* 37* 33*  CREATININE 2.16* 1.95* 1.78* 1.66*  CALCIUM 9.8 9.2 8.8* 9.0  MG  --  2.5*  --   --   PHOS  --  3.5  --   --    GFR: Estimated Creatinine Clearance: 25.3 mL/min (A) (by C-G formula based on SCr of 1.66 mg/dL (H)). Liver Function  Tests: Recent Labs  Lab 08/11/17 0736 08/12/17 0249  AST 18 16  ALT 15 12*  ALKPHOS 77 71  BILITOT 0.6 1.0  PROT 8.3* 7.1  ALBUMIN 3.7 3.2*   No results for input(s): LIPASE, AMYLASE in the last 168 hours. No results for input(s): AMMONIA in the last 168 hours. Coagulation Profile: Recent Labs  Lab 08/11/17 0736 08/12/17 0249  INR 1.15 1.16   Cardiac Enzymes: No results for input(s): CKTOTAL, CKMB, CKMBINDEX, TROPONINI in the last 168 hours. BNP (last 3 results) No results for input(s): PROBNP in the last 8760 hours. HbA1C: No results for input(s): HGBA1C in the last 72 hours. CBG: No results for input(s): GLUCAP in the last 168 hours. Lipid Profile: No results for input(s): CHOL, HDL, LDLCALC, TRIG, CHOLHDL, LDLDIRECT in the last 72 hours. Thyroid Function Tests: No  results for input(s): TSH, T4TOTAL, FREET4, T3FREE, THYROIDAB in the last 72 hours. Anemia Panel: No results for input(s): VITAMINB12, FOLATE, FERRITIN, TIBC, IRON, RETICCTPCT in the last 72 hours. Sepsis Labs: Recent Labs  Lab 08/11/17 0755 08/11/17 1349  PROCALCITON  --  0.38  LATICACIDVEN 1.25 1.3    Recent Results (from the past 240 hour(s))  Culture, blood (Routine x 2)     Status: None (Preliminary result)   Collection Time: 08/11/17  7:44 AM  Result Value Ref Range Status   Specimen Description BLOOD RIGHT FOREARM  Final   Special Requests   Final    BOTTLES DRAWN AEROBIC AND ANAEROBIC Blood Culture adequate volume   Culture NO GROWTH < 24 HOURS  Final   Report Status PENDING  Incomplete  Culture, blood (Routine x 2)     Status: None (Preliminary result)   Collection Time: 08/11/17  7:44 AM  Result Value Ref Range Status   Specimen Description BLOOD LEFT HAND  Final   Special Requests   Final    BOTTLES DRAWN AEROBIC AND ANAEROBIC Blood Culture adequate volume   Culture NO GROWTH < 24 HOURS  Final   Report Status PENDING  Incomplete  Culture, Urine     Status: None   Collection Time:  08/11/17  9:38 AM  Result Value Ref Range Status   Specimen Description URINE, CLEAN CATCH  Final   Special Requests NONE  Final   Culture NO GROWTH  Final   Report Status 08/12/2017 FINAL  Final  Respiratory Panel by PCR     Status: None   Collection Time: 08/11/17 11:31 AM  Result Value Ref Range Status   Adenovirus NOT DETECTED NOT DETECTED Final   Coronavirus 229E NOT DETECTED NOT DETECTED Final   Coronavirus HKU1 NOT DETECTED NOT DETECTED Final   Coronavirus NL63 NOT DETECTED NOT DETECTED Final   Coronavirus OC43 NOT DETECTED NOT DETECTED Final   Metapneumovirus NOT DETECTED NOT DETECTED Final   Rhinovirus / Enterovirus NOT DETECTED NOT DETECTED Final   Influenza A NOT DETECTED NOT DETECTED Final   Influenza B NOT DETECTED NOT DETECTED Final   Parainfluenza Virus 1 NOT DETECTED NOT DETECTED Final   Parainfluenza Virus 2 NOT DETECTED NOT DETECTED Final   Parainfluenza Virus 3 NOT DETECTED NOT DETECTED Final   Parainfluenza Virus 4 NOT DETECTED NOT DETECTED Final   Respiratory Syncytial Virus NOT DETECTED NOT DETECTED Final   Bordetella pertussis NOT DETECTED NOT DETECTED Final   Chlamydophila pneumoniae NOT DETECTED NOT DETECTED Final   Mycoplasma pneumoniae NOT DETECTED NOT DETECTED Final  MRSA PCR Screening     Status: None   Collection Time: 08/11/17  8:20 PM  Result Value Ref Range Status   MRSA by PCR NEGATIVE NEGATIVE Final    Comment:        The GeneXpert MRSA Assay (FDA approved for NASAL specimens only), is one component of a comprehensive MRSA colonization surveillance program. It is not intended to diagnose MRSA infection nor to guide or monitor treatment for MRSA infections.        Radiology Studies: Dg Chest Portable 1 View  Result Date: 08/11/2017 CLINICAL DATA:  Sepsis EXAM: PORTABLE CHEST 1 VIEW COMPARISON:  05/17/2012 FINDINGS: Cardiac shadow is enlarged. Postsurgical changes are again noted. Tortuous vascularity is seen and stable. The lungs are  clear bilaterally. No bony abnormality is seen. IMPRESSION: No acute abnormality noted. Electronically Signed   By: Alcide Clever M.D.   On: 08/11/2017 08:13  Scheduled Meds: . cloNIDine  0.3 mg Transdermal Weekly  . heparin  5,000 Units Subcutaneous Q8H   Continuous Infusions: . dextrose 5 % and 0.45% NaCl 100 mL/hr at 08/12/17 1106  . piperacillin-tazobactam (ZOSYN)  IV Stopped (08/12/17 1102)     LOS: 1 day    Time spent: 40 minutes   Noralee StainJennifer Arelie Kuzel, DO [facility] [email or web address] Password [email or web address] 08/12/2017, 12:46 PM

## 2017-08-12 NOTE — Consult Note (Signed)
WOC Nurse wound consult note Reason for Consult:stage II sacrum Wound type:pressure Pressure Injury POA: Yes Measurement: 2cm x 0.3cm x 0.1cm Wound bed:100% pink Drainage (amount, consistency, odor) none Periwound:intact Dressing procedure/placement/frequency: I have provided nurses with orders for wound care to sacral pressure injury. We will not follow, but will remain available to this patient, to nursing, and the medical and/or surgical teams.  Please re-consult if we need to assist further.   Barnett HatterMelinda Ranell Skibinski, RN-C, WTA-C Wound Treatment Associate

## 2017-08-12 NOTE — Progress Notes (Signed)
Initial Nutrition Assessment  DOCUMENTATION CODES:   Not applicable  INTERVENTION:   Monitor for dietary advancements or needs, provided medically appropriate nutrition intervention  If pt remains at high aspiration risk and within GOC, recommend enteral tube feeding.   NUTRITION DIAGNOSIS:   Inadequate oral intake related to dysphagia as evidenced by per patient/family report, estimated needs.  GOAL:   Patient will meet greater than or equal to 90% of their needs  MONITOR:   Diet advancement, Weight trends, I & O's  REASON FOR ASSESSMENT:   Consult Assessment of nutrition requirement/status  ASSESSMENT:   Pt with PMH of Alzheimer's dementia, HTN, HLD, hx of CVA (2013), CKD, and gout presents with decreased responsiveness found to be dehydrated with presumed aspiration pneumonia. Pt nonverbal and nonambulatory at baseline.   Discussed pt with RN.  Pt s/p bedside swallow evaluation 01/03, recommend pt remains NPO at this time d/t severe aspiration risk. Palliative care medicine has been consulted for GOC discussion.   Spoke with pt's husband at visit. Pt reports [facility].  Reports pt was eating well (estimated intake 50-75% of meals) a couple weeks ago. However since then, pt has been chewing food, "sucking the flavor", then spitting it out d/t swallowing difficulties. Per pt's husband, she has been on a pureed diet since this past weekend.   Pt's husband reports pt has had 10 lb weight loss but unsure of exact time frame because "they only weigh once a month." Reports pt's UBW is ~135 lbs.  Unable to use muscle depletions as malnutrition criteria given nonambulatory status.  Pt with increased needs given skin breakdown and limited PO intake. If pt remains at high aspiration risk and within GOC, recommend enteral tube feeding.  Labs reviewed; Na 156, Chloride >130, BUN 33, Magnesium 2.5, Albumin 3.2 Medications reviewed;  D5 @ 100 mL   NUTRITION - FOCUSED PHYSICAL EXAM:    Most Recent Value  Orbital Region  Mild depletion  Upper Arm Region  No depletion  Thoracic and Lumbar Region  No depletion  Buccal Region  No depletion  Temple Region  No depletion  Clavicle Bone Region  No depletion  Clavicle and Acromion Bone Region  No depletion  Scapular Bone Region  Unable to assess  Dorsal Hand  No depletion  Patellar Region  Moderate depletion  Anterior Thigh Region  Moderate depletion  Posterior Calf Region  Moderate depletion  Edema (RD Assessment)  None     Diet Order:  Diet NPO time specified  EDUCATION NEEDS:   Not appropriate for education at this time  Skin:  Skin Assessment: Skin Integrity Issues: Skin Integrity Issues:: Stage II Stage II: coccyx  Last BM:  Unknown BM date  Height:   Ht Readings from Last 1 Encounters:  08/11/17 5\' 1"  (1.549 m)    Weight:   Wt Readings from Last 1 Encounters:  08/11/17 135 lb (61.2 kg)    Ideal Body Weight:  47.7 kg  BMI:  Body mass index is 25.51 kg/m.  Estimated Nutritional Needs:   Kcal:  1550-1750  Protein:  75-85 grams  Fluid:  >/= 1.5 L/d  Fransisca KaufmannAllison Ioannides, MS, RDN, LDN 08/12/2017 2:22 PM

## 2017-08-13 ENCOUNTER — Inpatient Hospital Stay (HOSPITAL_COMMUNITY): Payer: Medicare Other

## 2017-08-13 LAB — BASIC METABOLIC PANEL
Anion gap: 9 (ref 5–15)
BUN: 19 mg/dL (ref 6–20)
CO2: 18 mmol/L — ABNORMAL LOW (ref 22–32)
Calcium: 8.5 mg/dL — ABNORMAL LOW (ref 8.9–10.3)
Chloride: 123 mmol/L — ABNORMAL HIGH (ref 101–111)
Creatinine, Ser: 1.51 mg/dL — ABNORMAL HIGH (ref 0.44–1.00)
GFR calc Af Amer: 38 mL/min — ABNORMAL LOW (ref >60)
GFR calc non Af Amer: 33 mL/min — ABNORMAL LOW (ref >60)
Glucose, Bld: 135 mg/dL — ABNORMAL HIGH (ref 65–99)
POTASSIUM: 3.1 mmol/L — AB (ref 3.5–5.1)
SODIUM: 150 mmol/L — AB (ref 135–145)

## 2017-08-13 LAB — CBC
HCT: 40.2 % (ref 36.0–46.0)
Hemoglobin: 12.4 g/dL (ref 12.0–15.0)
MCH: 29.8 pg (ref 26.0–34.0)
MCHC: 30.8 g/dL (ref 30.0–36.0)
MCV: 96.6 fL (ref 78.0–100.0)
Platelets: 261 10*3/uL (ref 150–400)
RBC: 4.16 MIL/uL (ref 3.87–5.11)
RDW: 13.7 % (ref 11.5–15.5)
WBC: 10.2 10*3/uL (ref 4.0–10.5)

## 2017-08-13 LAB — MAGNESIUM: Magnesium: 2 mg/dL (ref 1.7–2.4)

## 2017-08-13 MED ORDER — POTASSIUM CHLORIDE 10 MEQ/100ML IV SOLN
10.0000 meq | INTRAVENOUS | Status: AC
  Administered 2017-08-13 (×6): 10 meq via INTRAVENOUS
  Filled 2017-08-13 (×6): qty 100

## 2017-08-13 MED ORDER — CLONIDINE HCL 0.1 MG/24HR TD PTWK
0.1000 mg | MEDICATED_PATCH | TRANSDERMAL | Status: DC
  Administered 2017-08-13: 0.1 mg via TRANSDERMAL
  Filled 2017-08-13: qty 1

## 2017-08-13 NOTE — Clinical Social Work Note (Signed)
Clinical Social Work Assessment  Patient Details  Name: Amanda [name] MRN: [id number] Date of Birth: 1946-06-22  Date of referral:  08/13/17               Reason for consult:  Discharge Planning                Permission sought to share information with:  Facility Medical sales representativeContact Representative, Family Supports Permission granted to share information::  No  Name::     Building surveyorClarence  Agency::  [facility]  Relationship::  Spouse  Contact Information:  [phone]  Housing/Transportation Living arrangements for the past 2 months:  Skilled Nursing Facility Source of Information:  Spouse Patient Interpreter Needed:  None Criminal Activity/Legal Involvement Pertinent to Current Situation/Hospitalization:  No - Comment as needed Significant Relationships:  Spouse Lives with:  Facility Resident Do you feel safe going back to the place where you live?  Yes Need for family participation in patient care:  Yes (Comment)  Care giving concerns:  CSW received consult regarding discharge planning. CSW spoke with patient's husband. [facility] and will return [facility] at discharge. CSW to continue to follow and assist with discharge planning needs.   Social Worker assessment / plan:  CSW spoke with patient's spouse regarding return to SNF when stable.  Employment status:  Retired Database administratornsurance information:  Managed Medicare PT Recommendations:  Not assessed at this time Information / Referral to community resources:  Skilled Nursing Facility  Patient/Family's Response to care:  Patient's spouse reports agreement with discharge plan.  Patient/Family's Understanding of and Emotional Response to Diagnosis, Current Treatment, and Prognosis:  Patient/family is realistic regarding therapy needs and expressed being hopeful for SNF placement. Patient's spouse expressed understanding of CSW role and discharge process as well as medical condition. He had questions regarding patient's medical  condition. CSW directed him to the RN or MD. No questions/concerns about plan or treatment.    Emotional Assessment Appearance:  Appears stated age Attitude/Demeanor/Rapport:  Unable to Assess Affect (typically observed):  Unable to Assess Orientation:  (Disoriented x4) Alcohol / Substance use:  Not Applicable Psych involvement (Current and /or in the community):  No (Comment)  Discharge Needs  Concerns to be addressed:  Care Coordination Readmission within the last 30 days:  No Current discharge risk:  None Barriers to Discharge:  Continued Medical Work up   Amanda [name] [employer] Amanda [name], LCSWA 08/13/2017, 4:54 PM

## 2017-08-13 NOTE — Progress Notes (Signed)
Palliative Medicine consult noted. Due to high referral volume, there may be a delay seeing this patient. Please call the Palliative Medicine Team office at [phone] if recommendations are needed in the interim.  Thank you for inviting us to see this patient.  Margret ChanceMelanie G. Edric Fetterman, RN, BSN, [facility] 08/13/2017 2:01 PM Office [phone]

## 2017-08-13 NOTE — Progress Notes (Signed)
  Speech Language Pathology Treatment: Dysphagia  Patient Details Name: Amanda [name] MRN: [id number] DOB: 02-05-1946 Today's Date: 08/13/2017 Time: [id number] SLP Time Calculation (min) (ACUTE ONLY): 13 min  Assessment / Plan / Recommendation Clinical Impression  Pt is drowsy but opens her mouth to accept bites of puree, which she swallows with mildly prolonged oral transit but otherwise no overt signs of difficulty. Pt has poor awareness for bolus acceptance with thin liquids despite delivery method, with minimal liquids consumed. When she did swallow she had no overt coughing, but this was minimal amounts. For today would offer meds crushed in puree if fully alert. She could have some additional bites of puree from floor stock if alert and accepting. Otherwise, would hold additional POs pending improved alertness and/or further GOC discussion.   HPI HPI: 72 y.o. female with medical history significant for CKD, gout, history of CVA in 2013, frequent falls, hypertension, hyperlipidemia, chronic constipation, Alzheimer's dementia, nonverbal at baseline, brought to the ED for AMS and sepsis with unclear source. Per chart review, family says that she was eating well up to a few weeks ago, but then she started spitting it out. She was started on a pureed diet shortly before admission.       SLP Plan  Continue with current plan of care       Recommendations  Diet recommendations: NPO Medication Administration: Crushed with puree(if fully alert)                Oral Care Recommendations: Oral care QID Follow up Recommendations: Skilled Nursing facility SLP Visit Diagnosis: Dysphagia, oropharyngeal phase (R13.12) Plan: Continue with current plan of care       GO                [name], [name] 08/13/2017, 2:44 PM  [name], M.A. CCC-SLP [phone]

## 2017-08-13 NOTE — Progress Notes (Signed)
Patient heart rates in the 30's but non sustained.  Notified the MD on call .  Will continue to monitor the patient

## 2017-08-13 NOTE — Progress Notes (Addendum)
PROGRESS NOTE    Amanda [name]  ZOX:[id number] DOB: 11/26/1945 DOA: 08/11/2017 PCP: Pecola Lawless, MD     Brief Narrative:  Amanda [name] is a 72 yo female with past medical history significant for CKD, gout, history of CVA in 2013, frequent falls, hypertension, hyperlipidemia, chronic constipation, Alzheimer's dementia, nonverbal at baseline, nonambulatory at baseline who was brought to the ED for evaluation due to decreased responsiveness. Per husband at bedside, patient does not speak and does not walk. She lives at [facility] chronically. She had a fever and has not been eating much over the past week. She was admitted for sepsis due to suspected aspiration pneumonia, dehydration and hypernatremia.   Assessment & Plan:   Active Problems:   Essential hypertension, benign   Dementia with behavioral disturbance   CKD (chronic kidney disease) stage 3, GFR 30-59 ml/min (HCC)   Anxiety state   Vitamin D deficiency   Hyperlipidemia   Anemia of chronic disease   History of gout   Chronic constipation   History of stroke   Acute confusional state   Dehydration with hypernatremia   Sepsis (HCC)   Pressure injury of skin   Sepsis, unclear source  -Upon presentation, patient was tachycardic, tachypneic, fever of 102.7, leukocytosis WBC 12.3.  Initial chest x-ray was unremarkable although patient was quite dehydrated on admission. Initially vanco/zosyn started. Chest x-ray repeated today after IV hydration and no acute pulmonary etiology found  -Procalcitonin 0.38  -Respiratory PCR negative -Blood cultures pending -?Viral source causing gastritis leading to lack of appetite, decreased oral intake. History is unclear and patient is nonverbal  -Will continue IV zosyn another day until blood culture results available and remains afebrile 24 hours   Sinus bradycardia -Dropped down to rate 30-40s overnight and resolved spontaneously. Rate this morning is 80s. Will DC prn labetalol.  Continue catapres due to hypertension. Replace potassium. Continue telemetry   Hypernatremia in setting of decreased oral intake and dehydration -D5 half-normal saline -Improving. Trend BMP  Hypokalemia -Replace, trend   AKI on CKD stage III -Baseline creatinine 1.5 -Improved, continue IVF while NPO   Essential hypertension -Switch oral hypertensive to catapres patch due to NPO. Hydralazine IVF prn   Hyperlipidemia -Hold Lipitor due to NPO   Aspiration risk -SLP eval. Severe aspiration risk. NPO   Pressure ulcer, coccyx stage 2, POA -Wound RN consult   Dementia -Nonverbal at baseline   Goals of care -Patient is quite debilitated at baseline with dementia.  Patient has bilateral upper extremity contracture, does not ambulate, nonverbal at baseline.  She now has severe dysphagia and remains nothing by mouth.  Patient remains a full code, this was discussed with husband at bedside.  He states that he would like [place] to try interventions to revive if necessary.  We discussed the poor outcome and poor quality of life if that were to happen.  No change in CODE STATUS currently.  Palliative care medicine consulted.   DVT prophylaxis: subq hep  Code Status: FULL Family Communication: No family at bedside, called husband twice with no answer  Disposition Plan: Pending improvement, back to SNF   Consultants:   Palliative care  Procedures:   None   Antimicrobials:  Anti-infectives (From admission, onward)   Start     Dose/Rate Route Frequency Ordered Stop   08/13/17 0800  vancomycin (VANCOCIN) IVPB 1000 mg/200 mL premix  Status:  Discontinued     1,000 mg 200 mL/hr over 60 Minutes Intravenous Every 48 hours 08/11/17  1610 08/12/17 1242   08/11/17 2200  piperacillin-tazobactam (ZOSYN) IVPB 3.375 g     3.375 g 12.5 mL/hr over 240 Minutes Intravenous Every 8 hours 08/11/17 2142     08/11/17 1400  piperacillin-tazobactam (ZOSYN) IVPB 2.25 g  Status:  Discontinued     2.25 g 100  mL/hr over 30 Minutes Intravenous Every 8 hours 08/11/17 0904 08/11/17 2142   08/11/17 0800  piperacillin-tazobactam (ZOSYN) IVPB 3.375 g     3.375 g 100 mL/hr over 30 Minutes Intravenous  Once 08/11/17 0754 08/11/17 0854   08/11/17 0800  vancomycin (VANCOCIN) IVPB 1000 mg/200 mL premix  Status:  Discontinued     1,000 mg 200 mL/hr over 60 Minutes Intravenous  Once 08/11/17 0754 08/11/17 0756   08/11/17 0800  vancomycin (VANCOCIN) 1,250 mg in sodium chloride 0.9 % 250 mL IVPB     1,250 mg 166.7 mL/hr over 90 Minutes Intravenous  Once 08/11/17 0756 08/11/17 1127       Subjective: Patient nonverbal.   Objective: Vitals:   08/12/17 0720 08/12/17 2106 08/12/17 2313 08/13/17 0604  BP:  (!) 138/98 (!) 154/84 (!) 165/87  Pulse: 90 95 80 81  Resp: 19 20 16 15   Temp: 98.4 F (36.9 C) (!) 100.5 F (38.1 C)  98.8 F (37.1 C)  TempSrc: Axillary Axillary  Axillary  SpO2: 98% 98% 95% 99%  Weight:      Height:        Intake/Output Summary (Last 24 hours) at 08/13/2017 1310 Last data filed at 08/13/2017 0538 Gross per 24 hour  Intake 2003.33 ml  Output -  Net 2003.33 ml   Filed Weights   08/11/17 0731  Weight: 61.2 kg (135 lb)    Examination:  General exam: Appears calm Respiratory system: Clear to auscultation anteriorly  Cardiovascular system: S1 & S2 heard, RRR. No JVD, murmurs, rubs, gallops or clicks. No pedal edema. Gastrointestinal system: Abdomen is nondistended, soft and nontender. No organomegaly or masses felt. Normal bowel sounds heard. Central nervous system: Alert. Nonverbal, noninteractive.  Extremities: Bilateral upper extremity contractures  Skin: +stage 2 coccyx pressure wound, nondraining  Psychiatry: +Dementia   Data Reviewed: I have personally reviewed following labs and imaging studies  CBC: Recent Labs  Lab 08/11/17 0736 08/12/17 0249 08/13/17 0431  WBC 12.3* 10.7* 10.2  NEUTROABS 9.5*  --   --   HGB 13.6 13.9 12.4  HCT 44.3 45.4 40.2  MCV 98.7  99.6 96.6  PLT 285 243 261   Basic Metabolic Panel: Recent Labs  Lab 08/11/17 0736 08/11/17 1349 08/11/17 1843 08/12/17 0249 08/13/17 0431  NA 158* 156* 156* 156* 150*  K 3.6 3.5 3.6 3.9 3.1*  CL 127* 128* 129* >130* 123*  CO2 21* 20* 18* 15* 18*  GLUCOSE 110* 97 84 91 135*  BUN 47* 40* 37* 33* 19  CREATININE 2.16* 1.95* 1.78* 1.66* 1.51*  CALCIUM 9.8 9.2 8.8* 9.0 8.5*  MG  --  2.5*  --   --  2.0  PHOS  --  3.5  --   --   --    GFR: Estimated Creatinine Clearance: 27.9 mL/min (A) (by C-G formula based on SCr of 1.51 mg/dL (H)). Liver Function Tests: Recent Labs  Lab 08/11/17 0736 08/12/17 0249  AST 18 16  ALT 15 12*  ALKPHOS 77 71  BILITOT 0.6 1.0  PROT 8.3* 7.1  ALBUMIN 3.7 3.2*   No results for input(s): LIPASE, AMYLASE in the last 168 hours. No results for input(s):  AMMONIA in the last 168 hours. Coagulation Profile: Recent Labs  Lab 08/11/17 0736 08/12/17 0249  INR 1.15 1.16   Cardiac Enzymes: No results for input(s): CKTOTAL, CKMB, CKMBINDEX, TROPONINI in the last 168 hours. BNP (last 3 results) No results for input(s): PROBNP in the last 8760 hours. HbA1C: No results for input(s): HGBA1C in the last 72 hours. CBG: No results for input(s): GLUCAP in the last 168 hours. Lipid Profile: No results for input(s): CHOL, HDL, LDLCALC, TRIG, CHOLHDL, LDLDIRECT in the last 72 hours. Thyroid Function Tests: No results for input(s): TSH, T4TOTAL, FREET4, T3FREE, THYROIDAB in the last 72 hours. Anemia Panel: No results for input(s): VITAMINB12, FOLATE, FERRITIN, TIBC, IRON, RETICCTPCT in the last 72 hours. Sepsis Labs: Recent Labs  Lab 08/11/17 0755 08/11/17 1349  PROCALCITON  --  0.38  LATICACIDVEN 1.25 1.3    Recent Results (from the past 240 hour(s))  Culture, blood (Routine x 2)     Status: None (Preliminary result)   Collection Time: 08/11/17  7:44 AM  Result Value Ref Range Status   Specimen Description BLOOD RIGHT FOREARM  Final   Special  Requests   Final    BOTTLES DRAWN AEROBIC AND ANAEROBIC Blood Culture adequate volume   Culture NO GROWTH < 24 HOURS  Final   Report Status PENDING  Incomplete  Culture, blood (Routine x 2)     Status: None (Preliminary result)   Collection Time: 08/11/17  7:44 AM  Result Value Ref Range Status   Specimen Description BLOOD LEFT HAND  Final   Special Requests   Final    BOTTLES DRAWN AEROBIC AND ANAEROBIC Blood Culture adequate volume   Culture NO GROWTH < 24 HOURS  Final   Report Status PENDING  Incomplete  Culture, Urine     Status: None   Collection Time: 08/11/17  9:38 AM  Result Value Ref Range Status   Specimen Description URINE, CLEAN CATCH  Final   Special Requests NONE  Final   Culture NO GROWTH  Final   Report Status 08/12/2017 FINAL  Final  Respiratory Panel by PCR     Status: None   Collection Time: 08/11/17 11:31 AM  Result Value Ref Range Status   Adenovirus NOT DETECTED NOT DETECTED Final   Coronavirus 229E NOT DETECTED NOT DETECTED Final   Coronavirus HKU1 NOT DETECTED NOT DETECTED Final   Coronavirus NL63 NOT DETECTED NOT DETECTED Final   Coronavirus OC43 NOT DETECTED NOT DETECTED Final   Metapneumovirus NOT DETECTED NOT DETECTED Final   Rhinovirus / Enterovirus NOT DETECTED NOT DETECTED Final   Influenza A NOT DETECTED NOT DETECTED Final   Influenza B NOT DETECTED NOT DETECTED Final   Parainfluenza Virus 1 NOT DETECTED NOT DETECTED Final   Parainfluenza Virus 2 NOT DETECTED NOT DETECTED Final   Parainfluenza Virus 3 NOT DETECTED NOT DETECTED Final   Parainfluenza Virus 4 NOT DETECTED NOT DETECTED Final   Respiratory Syncytial Virus NOT DETECTED NOT DETECTED Final   Bordetella pertussis NOT DETECTED NOT DETECTED Final   Chlamydophila pneumoniae NOT DETECTED NOT DETECTED Final   Mycoplasma pneumoniae NOT DETECTED NOT DETECTED Final  MRSA PCR Screening     Status: None   Collection Time: 08/11/17  8:20 PM  Result Value Ref Range Status   MRSA by PCR NEGATIVE  NEGATIVE Final    Comment:        The GeneXpert MRSA Assay (FDA approved for NASAL specimens only), is one component of a comprehensive MRSA colonization surveillance program.  It is not intended to diagnose MRSA infection nor to guide or monitor treatment for MRSA infections.        Radiology Studies: Dg Chest Port 1 View  Result Date: 08/13/2017 CLINICAL DATA:  Shortness of breath. EXAM: PORTABLE CHEST 1 VIEW COMPARISON:  08/11/2017. FINDINGS: Prior CABG. Cardiomegaly with normal pulmonary vascularity. No focal infiltrate. No pleural effusion or pneumothorax. No acute bony abnormality . IMPRESSION: 1. Prior CABG.  Cardiomegaly.  No pulmonary venous congestion. 2. No acute pulmonary disease. Electronically Signed   By: Maisie Fus  Register   On: 08/13/2017 09:09      Scheduled Meds: . cloNIDine  0.1 mg Transdermal Q Fri  . heparin  5,000 Units Subcutaneous Q8H   Continuous Infusions: . dextrose 5 % and 0.45% NaCl 100 mL/hr at 08/12/17 2125  . piperacillin-tazobactam (ZOSYN)  IV Stopped (08/13/17 1610)  . potassium chloride 10 mEq (08/13/17 1231)     LOS: 2 days    Time spent: 30 minutes   Noralee Stain, DO [facility] [email or web address] Password [email or web address] 08/13/2017, 1:10 PM

## 2017-08-14 DIAGNOSIS — Z515 Encounter for palliative care: Secondary | ICD-10-CM

## 2017-08-14 DIAGNOSIS — J690 Pneumonitis due to inhalation of food and vomit: Secondary | ICD-10-CM

## 2017-08-14 LAB — CBC
HEMATOCRIT: 39.9 % (ref 36.0–46.0)
HEMOGLOBIN: 12.7 g/dL (ref 12.0–15.0)
MCH: 29.9 pg (ref 26.0–34.0)
MCHC: 31.8 g/dL (ref 30.0–36.0)
MCV: 93.9 fL (ref 78.0–100.0)
Platelets: 272 10*3/uL (ref 150–400)
RBC: 4.25 MIL/uL (ref 3.87–5.11)
RDW: 13.3 % (ref 11.5–15.5)
WBC: 9.7 10*3/uL (ref 4.0–10.5)

## 2017-08-14 LAB — MAGNESIUM: Magnesium: 1.8 mg/dL (ref 1.7–2.4)

## 2017-08-14 LAB — BASIC METABOLIC PANEL
Anion gap: 10 (ref 5–15)
BUN: 9 mg/dL (ref 6–20)
CHLORIDE: 120 mmol/L — AB (ref 101–111)
CO2: 18 mmol/L — AB (ref 22–32)
CREATININE: 1.33 mg/dL — AB (ref 0.44–1.00)
Calcium: 8.8 mg/dL — ABNORMAL LOW (ref 8.9–10.3)
GFR calc Af Amer: 45 mL/min — ABNORMAL LOW (ref >60)
GFR calc non Af Amer: 39 mL/min — ABNORMAL LOW (ref >60)
GLUCOSE: 115 mg/dL — AB (ref 65–99)
POTASSIUM: 3.5 mmol/L (ref 3.5–5.1)
Sodium: 148 mmol/L — ABNORMAL HIGH (ref 135–145)

## 2017-08-14 NOTE — Progress Notes (Signed)
One large episode of dark emesis. [facility] called for further orders.

## 2017-08-14 NOTE — Progress Notes (Signed)
Went by to see pt. Chart reviewed. Pt unable to participate in assessment. Called spouse; LM. Will continue and try to reach family for GOC. In the interim if suggestions needed, please call [phone]. If DC imminent, recommend pt [facility] palliative [facility]. Please place recommendations for palliative st SNF in DC summary. Thank you,  Eduard RouxSarah Bora Broner, ANP

## 2017-08-14 NOTE — Progress Notes (Signed)
  Speech Language Pathology Treatment: Dysphagia  Patient Details Name: Amanda [name] MRN: [id number] DOB: March 17, 1946 Today's Date: 08/14/2017 Time: [id number] SLP Time Calculation (min) (ACUTE ONLY): 25 min  Assessment / Plan / Recommendation Clinical Impression  Pt demonstrates improving arousal and awareness of PO with tactile cues, though still severely limited by advanced dementia. When puree spoon touch to lip spt consistently accepted and formed bolus with mild dis-coordiantion and initiated swallow without signs of aspiration. Transition from thin to honey thick liquids with noted oral dis-coordination and suspected premature spillage and delayed swallow with cup and straw sips. Immediate hard coughing observed with thin and large sips of nectar. Pt only tolerated honey thick liquids consistently. With ongoing improvement in mentation pt may be able to resume thin/puree eventually, but for now will start puree and honey thick liquids with total assist when pt is alert.    HPI HPI: 72 y.o. female with medical history significant for CKD, gout, history of CVA in 2013, frequent falls, hypertension, hyperlipidemia, chronic constipation, Alzheimer's dementia, nonverbal at baseline, brought to the ED for AMS and sepsis with unclear source. Per chart review, family says that she was eating well up to a few weeks ago, but then she started spitting it out. She was started on a pureed diet shortly before admission.       SLP Plan  Continue with current plan of care       Recommendations  Diet recommendations: Dysphagia 1 (puree);Honey-thick liquid Liquids provided via: Teaspoon;Straw Medication Administration: Crushed with puree Supervision: Full supervision/cueing for compensatory strategies Compensations: Slow rate;Small sips/bites;Monitor for anterior loss Postural Changes and/or Swallow Maneuvers: Seated upright 90 degrees;Upright 30-60 min after meal                Oral Care  Recommendations: Oral care BID Follow up Recommendations: Skilled Nursing facility SLP Visit Diagnosis: Dysphagia, oropharyngeal phase (R13.12) Plan: Continue with current plan of care       GO                Tramaine Sauls, Riley NearingBonnie Caroline 08/14/2017, 10:00 AM

## 2017-08-14 NOTE — Progress Notes (Signed)
PROGRESS NOTE    Amanda [name]  ZOX:[id number] DOB: Dec 05, 1945 DOA: 08/11/2017 PCP: Pecola Lawless, MD     Brief Narrative:  Amanda [name] is a 72 yo female with past medical history significant for CKD, gout, history of CVA in 2013, frequent falls, hypertension, hyperlipidemia, chronic constipation, Alzheimer's dementia, nonverbal at baseline, nonambulatory at baseline who was brought to the ED for evaluation due to decreased responsiveness. Per husband at bedside, patient does not speak and does not walk. She lives at [facility] chronically. She had a fever and has not been eating much over the past week. She was admitted for sepsis due to suspected aspiration pneumonia, dehydration and hypernatremia.   Assessment & Plan:   Active Problems:   Essential hypertension, benign   Dementia with behavioral disturbance   CKD (chronic kidney disease) stage 3, GFR 30-59 ml/min (HCC)   Anxiety state   Vitamin D deficiency   Hyperlipidemia   Anemia of chronic disease   History of gout   Chronic constipation   History of stroke   Acute confusional state   Dehydration with hypernatremia   Sepsis (HCC)   Pressure injury of skin   Aspiration pneumonia (HCC)   Palliative care encounter   Sepsis, unclear source  -Upon presentation, patient was tachycardic, tachypneic, fever of 102.7, leukocytosis WBC 12.3.  Initial chest x-ray was unremarkable although patient was quite dehydrated on admission. Initially vanco/zosyn started. Chest x-ray repeated today after IV hydration and no acute pulmonary etiology found  -Procalcitonin 0.38  -Respiratory PCR negative -Blood cultures negative to date  -Patient received 4 days of zosyn. She remains afebrile, WBC normal. No bacterial etiology of infection found. Will DC zosyn and monitor.   Sinus bradycardia -Dropped down to rate 30-40s overnight 1/4 and resolved spontaneously. Rate this morning is 80s. Will DC prn labetalol. Continue catapres due to  hypertension. Stable today.   Hypernatremia in setting of decreased oral intake and dehydration -D5 half-normal saline -Improving. Trend BMP  AKI on CKD stage III -Baseline creatinine 1.5 -Improved, continue IVF while NPO   Essential hypertension -Switch oral hypertensive to catapres patch due to NPO. Hydralazine IVF prn   Hyperlipidemia -Hold Lipitor for now until oral intake stable   Aspiration risk -SLP eval. Severe aspiration risk. Diet advanced to dysphagia 1 today. Hopefully can avoid PEG in this chronically debilitated patient with dementia   Pressure ulcer, coccyx stage 2, POA -Wound RN consult   Dementia -Nonverbal at baseline   Goals of care -Patient is quite debilitated at baseline with dementia.  Patient has bilateral upper extremity contracture, does not ambulate, nonverbal at baseline.  She now has severe dysphagia and remains nothing by mouth.  Patient remains a full code, this was discussed with husband at bedside.  He states that he would like [place] to try interventions to revive if necessary.  We discussed the poor outcome and poor quality of life if that were to happen.  No change in CODE STATUS currently.  Palliative care medicine consulted.   DVT prophylaxis: subq hep  Code Status: FULL Family Communication: No family at bedside, called husband twice with no answer  Disposition Plan: Pending improvement, back to SNF   Consultants:   Palliative care  Procedures:   None   Antimicrobials:  Anti-infectives (From admission, onward)   Start     Dose/Rate Route Frequency Ordered Stop   08/13/17 0800  vancomycin (VANCOCIN) IVPB 1000 mg/200 mL premix  Status:  Discontinued  1,000 mg 200 mL/hr over 60 Minutes Intravenous Every 48 hours 08/11/17 0904 08/12/17 1242   08/11/17 2200  piperacillin-tazobactam (ZOSYN) IVPB 3.375 g  Status:  Discontinued     3.375 g 12.5 mL/hr over 240 Minutes Intravenous Every 8 hours 08/11/17 2142 08/14/17 1139   08/11/17 1400   piperacillin-tazobactam (ZOSYN) IVPB 2.25 g  Status:  Discontinued     2.25 g 100 mL/hr over 30 Minutes Intravenous Every 8 hours 08/11/17 0904 08/11/17 2142   08/11/17 0800  piperacillin-tazobactam (ZOSYN) IVPB 3.375 g     3.375 g 100 mL/hr over 30 Minutes Intravenous  Once 08/11/17 0754 08/11/17 0854   08/11/17 0800  vancomycin (VANCOCIN) IVPB 1000 mg/200 mL premix  Status:  Discontinued     1,000 mg 200 mL/hr over 60 Minutes Intravenous  Once 08/11/17 0754 08/11/17 0756   08/11/17 0800  vancomycin (VANCOCIN) 1,250 mg in sodium chloride 0.9 % 250 mL IVPB     1,250 mg 166.7 mL/hr over 90 Minutes Intravenous  Once 08/11/17 0756 08/11/17 1127       Subjective: Patient nonverbal.   Objective: Vitals:   08/14/17 0800 08/14/17 0900 08/14/17 1000 08/14/17 1009  BP:    (!) 161/104  Pulse: 87 94 91 94  Resp: 17 16 20  (!) 22  Temp:      TempSrc:      SpO2: 98% 98% 98% 98%  Weight:      Height:        Intake/Output Summary (Last 24 hours) at 08/14/2017 1142 Last data filed at 08/14/2017 0900 Gross per 24 hour  Intake 2766.67 ml  Output 500 ml  Net 2266.67 ml   Filed Weights   08/11/17 0731  Weight: 61.2 kg (135 lb)    Examination:  General exam: Appears calm Respiratory system: Clear to auscultation anteriorly  Cardiovascular system: S1 & S2 heard, RRR. No JVD, murmurs, rubs, gallops or clicks. No pedal edema. Gastrointestinal system: Abdomen is nondistended, soft and nontender. No organomegaly or masses felt. Normal bowel sounds heard. Central nervous system: Alert. Nonverbal, noninteractive.  Extremities: Bilateral upper extremity contractures  Skin: +stage 2 coccyx pressure wound, nondraining  Psychiatry: +Dementia   Data Reviewed: I have personally reviewed following labs and imaging studies  CBC: Recent Labs  Lab 08/11/17 0736 08/12/17 0249 08/13/17 0431 08/14/17 0214  WBC 12.3* 10.7* 10.2 9.7  NEUTROABS 9.5*  --   --   --   HGB 13.6 13.9 12.4 12.7  HCT 44.3  45.4 40.2 39.9  MCV 98.7 99.6 96.6 93.9  PLT 285 243 261 272   Basic Metabolic Panel: Recent Labs  Lab 08/11/17 1349 08/11/17 1843 08/12/17 0249 08/13/17 0431 08/14/17 0214  NA 156* 156* 156* 150* 148*  K 3.5 3.6 3.9 3.1* 3.5  CL 128* 129* >130* 123* 120*  CO2 20* 18* 15* 18* 18*  GLUCOSE 97 84 91 135* 115*  BUN 40* 37* 33* 19 9  CREATININE 1.95* 1.78* 1.66* 1.51* 1.33*  CALCIUM 9.2 8.8* 9.0 8.5* 8.8*  MG 2.5*  --   --  2.0 1.8  PHOS 3.5  --   --   --   --    GFR: Estimated Creatinine Clearance: 31.6 mL/min (A) (by C-G formula based on SCr of 1.33 mg/dL (H)). Liver Function Tests: Recent Labs  Lab 08/11/17 0736 08/12/17 0249  AST 18 16  ALT 15 12*  ALKPHOS 77 71  BILITOT 0.6 1.0  PROT 8.3* 7.1  ALBUMIN 3.7 3.2*   No  results for input(s): LIPASE, AMYLASE in the last 168 hours. No results for input(s): AMMONIA in the last 168 hours. Coagulation Profile: Recent Labs  Lab 08/11/17 0736 08/12/17 0249  INR 1.15 1.16   Cardiac Enzymes: No results for input(s): CKTOTAL, CKMB, CKMBINDEX, TROPONINI in the last 168 hours. BNP (last 3 results) No results for input(s): PROBNP in the last 8760 hours. HbA1C: No results for input(s): HGBA1C in the last 72 hours. CBG: No results for input(s): GLUCAP in the last 168 hours. Lipid Profile: No results for input(s): CHOL, HDL, LDLCALC, TRIG, CHOLHDL, LDLDIRECT in the last 72 hours. Thyroid Function Tests: No results for input(s): TSH, T4TOTAL, FREET4, T3FREE, THYROIDAB in the last 72 hours. Anemia Panel: No results for input(s): VITAMINB12, FOLATE, FERRITIN, TIBC, IRON, RETICCTPCT in the last 72 hours. Sepsis Labs: Recent Labs  Lab 08/11/17 0755 08/11/17 1349  PROCALCITON  --  0.38  LATICACIDVEN 1.25 1.3    Recent Results (from the past 240 hour(s))  Culture, blood (Routine x 2)     Status: None (Preliminary result)   Collection Time: 08/11/17  7:44 AM  Result Value Ref Range Status   Specimen Description BLOOD  RIGHT FOREARM  Final   Special Requests   Final    BOTTLES DRAWN AEROBIC AND ANAEROBIC Blood Culture adequate volume   Culture NO GROWTH 3 DAYS  Final   Report Status PENDING  Incomplete  Culture, blood (Routine x 2)     Status: None (Preliminary result)   Collection Time: 08/11/17  7:44 AM  Result Value Ref Range Status   Specimen Description BLOOD LEFT HAND  Final   Special Requests   Final    BOTTLES DRAWN AEROBIC AND ANAEROBIC Blood Culture adequate volume   Culture NO GROWTH 3 DAYS  Final   Report Status PENDING  Incomplete  Culture, Urine     Status: None   Collection Time: 08/11/17  9:38 AM  Result Value Ref Range Status   Specimen Description URINE, CLEAN CATCH  Final   Special Requests NONE  Final   Culture NO GROWTH  Final   Report Status 08/12/2017 FINAL  Final  Respiratory Panel by PCR     Status: None   Collection Time: 08/11/17 11:31 AM  Result Value Ref Range Status   Adenovirus NOT DETECTED NOT DETECTED Final   Coronavirus 229E NOT DETECTED NOT DETECTED Final   Coronavirus HKU1 NOT DETECTED NOT DETECTED Final   Coronavirus NL63 NOT DETECTED NOT DETECTED Final   Coronavirus OC43 NOT DETECTED NOT DETECTED Final   Metapneumovirus NOT DETECTED NOT DETECTED Final   Rhinovirus / Enterovirus NOT DETECTED NOT DETECTED Final   Influenza A NOT DETECTED NOT DETECTED Final   Influenza B NOT DETECTED NOT DETECTED Final   Parainfluenza Virus 1 NOT DETECTED NOT DETECTED Final   Parainfluenza Virus 2 NOT DETECTED NOT DETECTED Final   Parainfluenza Virus 3 NOT DETECTED NOT DETECTED Final   Parainfluenza Virus 4 NOT DETECTED NOT DETECTED Final   Respiratory Syncytial Virus NOT DETECTED NOT DETECTED Final   Bordetella pertussis NOT DETECTED NOT DETECTED Final   Chlamydophila pneumoniae NOT DETECTED NOT DETECTED Final   Mycoplasma pneumoniae NOT DETECTED NOT DETECTED Final  MRSA PCR Screening     Status: None   Collection Time: 08/11/17  8:20 PM  Result Value Ref Range Status    MRSA by PCR NEGATIVE NEGATIVE Final    Comment:        The GeneXpert MRSA Assay (FDA approved for NASAL  specimens only), is one component of a comprehensive MRSA colonization surveillance program. It is not intended to diagnose MRSA infection nor to guide or monitor treatment for MRSA infections.        Radiology Studies: Dg Chest Port 1 View  Result Date: 08/13/2017 CLINICAL DATA:  Shortness of breath. EXAM: PORTABLE CHEST 1 VIEW COMPARISON:  08/11/2017. FINDINGS: Prior CABG. Cardiomegaly with normal pulmonary vascularity. No focal infiltrate. No pleural effusion or pneumothorax. No acute bony abnormality . IMPRESSION: 1. Prior CABG.  Cardiomegaly.  No pulmonary venous congestion. 2. No acute pulmonary disease. Electronically Signed   By: Maisie Fushomas  Register   On: 08/13/2017 09:09      Scheduled Meds: . cloNIDine  0.1 mg Transdermal Q Fri  . heparin  5,000 Units Subcutaneous Q8H   Continuous Infusions: . dextrose 5 % and 0.45% NaCl 100 mL/hr at 08/14/17 0531     LOS: 3 days    Time spent: 30 minutes   Noralee StainJennifer Jenina Moening, DO [facility] [email or web address] Password [email or web address] 08/14/2017, 11:42 AM

## 2017-08-15 ENCOUNTER — Inpatient Hospital Stay (HOSPITAL_COMMUNITY): Payer: Medicare Other

## 2017-08-15 LAB — CBC
HCT: 45.7 % (ref 36.0–46.0)
HEMOGLOBIN: 15.7 g/dL — AB (ref 12.0–15.0)
MCH: 31.3 pg (ref 26.0–34.0)
MCHC: 34.4 g/dL (ref 30.0–36.0)
MCV: 91 fL (ref 78.0–100.0)
Platelets: 311 10*3/uL (ref 150–400)
RBC: 5.02 MIL/uL (ref 3.87–5.11)
RDW: 13.4 % (ref 11.5–15.5)
WBC: 12.7 10*3/uL — ABNORMAL HIGH (ref 4.0–10.5)

## 2017-08-15 LAB — CBC WITH DIFFERENTIAL/PLATELET
BASOS PCT: 0 %
Basophils Absolute: 0 10*3/uL (ref 0.0–0.1)
EOS ABS: 0.1 10*3/uL (ref 0.0–0.7)
EOS PCT: 1 %
HCT: 44.4 % (ref 36.0–46.0)
HEMOGLOBIN: 14.8 g/dL (ref 12.0–15.0)
LYMPHS PCT: 16 %
Lymphs Abs: 1.8 10*3/uL (ref 0.7–4.0)
MCH: 30.7 pg (ref 26.0–34.0)
MCHC: 33.3 g/dL (ref 30.0–36.0)
MCV: 92.1 fL (ref 78.0–100.0)
MONO ABS: 0.6 10*3/uL (ref 0.1–1.0)
Monocytes Relative: 5 %
NEUTROS ABS: 8.5 10*3/uL — AB (ref 1.7–7.7)
Neutrophils Relative %: 78 %
Platelets: ADEQUATE 10*3/uL (ref 150–400)
RBC: 4.82 MIL/uL (ref 3.87–5.11)
RDW: 13.2 % (ref 11.5–15.5)
WBC: 11 10*3/uL — ABNORMAL HIGH (ref 4.0–10.5)

## 2017-08-15 LAB — BASIC METABOLIC PANEL
Anion gap: 10 (ref 5–15)
BUN: 13 mg/dL (ref 6–20)
CALCIUM: 9 mg/dL (ref 8.9–10.3)
CHLORIDE: 112 mmol/L — AB (ref 101–111)
CO2: 17 mmol/L — AB (ref 22–32)
CREATININE: 1.38 mg/dL — AB (ref 0.44–1.00)
GFR calc Af Amer: 43 mL/min — ABNORMAL LOW (ref >60)
GFR calc non Af Amer: 37 mL/min — ABNORMAL LOW (ref >60)
GLUCOSE: 129 mg/dL — AB (ref 65–99)
Potassium: 3.6 mmol/L (ref 3.5–5.1)
Sodium: 139 mmol/L (ref 135–145)

## 2017-08-15 MED ORDER — SENNOSIDES 8.8 MG/5ML PO SYRP
5.0000 mL | ORAL_SOLUTION | Freq: Every day | ORAL | Status: DC
  Administered 2017-08-15 – 2017-08-17 (×2): 5 mL via ORAL
  Filled 2017-08-15 (×3): qty 5

## 2017-08-15 MED ORDER — LABETALOL HCL 100 MG PO TABS
100.0000 mg | ORAL_TABLET | Freq: Two times a day (BID) | ORAL | Status: DC
Start: 2017-08-15 — End: 2017-08-16
  Administered 2017-08-15 – 2017-08-16 (×3): 100 mg via ORAL
  Filled 2017-08-15 (×3): qty 1

## 2017-08-15 NOTE — Progress Notes (Signed)
Paged Dr. Alvino Chapelhoi about patient being flushed, febrile 100.2, BP 140/92, MAP 104, HR 118, Resp 29. Gave tylenol suppository. BPs up and down with Resp fluctuating 16 to as high as 33. Will continue to follow as needed.

## 2017-08-15 NOTE — Progress Notes (Addendum)
PROGRESS NOTE    Amanda [name]  ZOX:[id number]:[id number] DOB: December 03, 1945 DOA: 08/11/2017 PCP: Pecola LawlessHopper, William F, MD     Brief Narrative:  Amanda [name] is a 72 yo female with past medical history significant for CKD, gout, history of CVA in 2013, frequent falls, hypertension, hyperlipidemia, chronic constipation, Alzheimer's dementia, nonverbal at baseline, nonambulatory at baseline who was brought to the ED for evaluation due to decreased responsiveness. Per husband at bedside, patient does not speak and does not walk. [facility] She had a fever and has not been eating much over the past week. She was admitted for sepsis due to suspected aspiration pneumonia, dehydration and hypernatremia.   Assessment & Plan:   Active Problems:   Essential hypertension, benign   Dementia with behavioral disturbance   CKD (chronic kidney disease) stage 3, GFR 30-59 ml/min (HCC)   Anxiety state   Vitamin D deficiency   Hyperlipidemia   Anemia of chronic disease   History of gout   Chronic constipation   History of stroke   Acute confusional state   Dehydration with hypernatremia   Sepsis (HCC)   Pressure injury of skin   Aspiration pneumonia (HCC)   Palliative care encounter   Sepsis, unclear source  -Upon presentation, patient was tachycardic, tachypneic, fever of 102.7, leukocytosis WBC 12.3.  Initial chest x-ray was unremarkable although patient was quite dehydrated on admission. Initially vanco/zosyn started. Chest x-ray repeated after IV hydration and no acute pulmonary etiology found  -Procalcitonin 0.38  -Respiratory PCR negative -Blood cultures negative to date  -Patient received 4 days of zosyn. She remains afebrile. No bacterial etiology of infection found. Monitoring off antibiotics now    Vomiting -Episode of vomiting last night. AXR and CXR obtained this morning and were unremarkable. Resume dysphagia diet. Will order senna for bowel regimen. Monitor   Sinus  tachycardia -Resume labetalol. She takes 300mg  BID. Will start at a lower dose and titrate  Hypernatremia in setting of decreased oral intake and dehydration -D5 half-normal saline - decrease rate today  -Improving. Trend BMP  AKI on CKD stage III -Baseline creatinine 1.5 -Stable, continue IVF while until oral intake adequate   Essential hypertension -Switch oral hypertensive to catapres patch. Resume labetalol PO. Hydralazine IVF prn   Hyperlipidemia -Hold Lipitor for now until oral intake stable   Aspiration risk -SLP eval. Severe aspiration risk. Diet advanced to dysphagia 1. Husband at bedside will aid with feeding and encouraging intake.   Pressure ulcer, coccyx stage 2, POA -Wound RN consult   Dementia -Nonverbal at baseline   Goals of care -Patient is quite debilitated at baseline with dementia.  Patient has bilateral upper extremity contracture, does not ambulate, nonverbal at baseline.  Palliative care medicine consulted.   DVT prophylaxis: subq hep  Code Status: FULL Family Communication: Husband at bedside  Disposition Plan: Pending improvement, back to SNF   Consultants:   Palliative care  Procedures:   None   Antimicrobials:  Anti-infectives (From admission, onward)   Start     Dose/Rate Route Frequency Ordered Stop   08/13/17 0800  vancomycin (VANCOCIN) IVPB 1000 mg/200 mL premix  Status:  Discontinued     1,000 mg 200 mL/hr over 60 Minutes Intravenous Every 48 hours 08/11/17 0904 08/12/17 1242   08/11/17 2200  piperacillin-tazobactam (ZOSYN) IVPB 3.375 g  Status:  Discontinued     3.375 g 12.5 mL/hr over 240 Minutes Intravenous Every 8 hours 08/11/17 2142 08/14/17 1139   08/11/17 1400  piperacillin-tazobactam (ZOSYN) IVPB 2.25 g  Status:  Discontinued     2.25 g 100 mL/hr over 30 Minutes Intravenous Every 8 hours 08/11/17 0904 08/11/17 2142   08/11/17 0800  piperacillin-tazobactam (ZOSYN) IVPB 3.375 g     3.375 g 100 mL/hr over 30 Minutes  Intravenous  Once 08/11/17 0754 08/11/17 0854   08/11/17 0800  vancomycin (VANCOCIN) IVPB 1000 mg/200 mL premix  Status:  Discontinued     1,000 mg 200 mL/hr over 60 Minutes Intravenous  Once 08/11/17 0754 08/11/17 0756   08/11/17 0800  vancomycin (VANCOCIN) 1,250 mg in sodium chloride 0.9 % 250 mL IVPB     1,250 mg 166.7 mL/hr over 90 Minutes Intravenous  Once 08/11/17 0756 08/11/17 1127       Subjective: Patient nonverbal. Overnight event reviewed.   Objective: Vitals:   08/15/17 0609 08/15/17 0653 08/15/17 0900 08/15/17 0925  BP: (!) 164/107 124/69  128/87  Pulse: (!) 113 (!) 115 (!) 117 (!) 118  Resp: (!) 25 (!) 23 (!) 23   Temp:      TempSrc:      SpO2: 97% 94% 99%   Weight:      Height:        Intake/Output Summary (Last 24 hours) at 08/15/2017 1036 Last data filed at 08/15/2017 0900 Gross per 24 hour  Intake 1078 ml  Output -  Net 1078 ml   Filed Weights   08/11/17 0731  Weight: 61.2 kg (135 lb)    Examination:  General exam: Appears calm, alert to voice  Respiratory system: Clear to auscultation anteriorly  Cardiovascular system: S1 & S2 heard, tachycardic, regular rhythm. No JVD, murmurs, rubs, gallops or clicks. No pedal edema. Gastrointestinal system: Abdomen is nondistended, soft and nontender. No organomegaly or masses felt. Normal bowel sounds heard. Central nervous system: Alert. Nonverbal, noninteractive.  Extremities: Bilateral upper extremity contractures  Skin: +stage 2 coccyx pressure wound, nondraining  Psychiatry: +Dementia   Data Reviewed: I have personally reviewed following labs and imaging studies  CBC: Recent Labs  Lab 08/11/17 0736 08/12/17 0249 08/13/17 0431 08/14/17 0214 08/14/17 2210 08/15/17 0229  WBC 12.3* 10.7* 10.2 9.7 11.0* 12.7*  NEUTROABS 9.5*  --   --   --  8.5*  --   HGB 13.6 13.9 12.4 12.7 14.8 15.7*  HCT 44.3 45.4 40.2 39.9 44.4 45.7  MCV 98.7 99.6 96.6 93.9 92.1 91.0  PLT 285 243 261 272 PLATELET CLUMPS NOTED ON  SMEAR, COUNT APPEARS ADEQUATE 311   Basic Metabolic Panel: Recent Labs  Lab 08/11/17 1349 08/11/17 1843 08/12/17 0249 08/13/17 0431 08/14/17 0214 08/15/17 0229  NA 156* 156* 156* 150* 148* 139  K 3.5 3.6 3.9 3.1* 3.5 3.6  CL 128* 129* >130* 123* 120* 112*  CO2 20* 18* 15* 18* 18* 17*  GLUCOSE 97 84 91 135* 115* 129*  BUN 40* 37* 33* 19 9 13   CREATININE 1.95* 1.78* 1.66* 1.51* 1.33* 1.38*  CALCIUM 9.2 8.8* 9.0 8.5* 8.8* 9.0  MG 2.5*  --   --  2.0 1.8  --   PHOS 3.5  --   --   --   --   --    GFR: Estimated Creatinine Clearance: 30.5 mL/min (A) (by C-G formula based on SCr of 1.38 mg/dL (H)). Liver Function Tests: Recent Labs  Lab 08/11/17 0736 08/12/17 0249  AST 18 16  ALT 15 12*  ALKPHOS 77 71  BILITOT 0.6 1.0  PROT 8.3* 7.1  ALBUMIN 3.7 3.2*  No results for input(s): LIPASE, AMYLASE in the last 168 hours. No results for input(s): AMMONIA in the last 168 hours. Coagulation Profile: Recent Labs  Lab 08/11/17 0736 08/12/17 0249  INR 1.15 1.16   Cardiac Enzymes: No results for input(s): CKTOTAL, CKMB, CKMBINDEX, TROPONINI in the last 168 hours. BNP (last 3 results) No results for input(s): PROBNP in the last 8760 hours. HbA1C: No results for input(s): HGBA1C in the last 72 hours. CBG: No results for input(s): GLUCAP in the last 168 hours. Lipid Profile: No results for input(s): CHOL, HDL, LDLCALC, TRIG, CHOLHDL, LDLDIRECT in the last 72 hours. Thyroid Function Tests: No results for input(s): TSH, T4TOTAL, FREET4, T3FREE, THYROIDAB in the last 72 hours. Anemia Panel: No results for input(s): VITAMINB12, FOLATE, FERRITIN, TIBC, IRON, RETICCTPCT in the last 72 hours. Sepsis Labs: Recent Labs  Lab 08/11/17 0755 08/11/17 1349  PROCALCITON  --  0.38  LATICACIDVEN 1.25 1.3    Recent Results (from the past 240 hour(s))  Culture, blood (Routine x 2)     Status: None (Preliminary result)   Collection Time: 08/11/17  7:44 AM  Result Value Ref Range Status    Specimen Description BLOOD RIGHT FOREARM  Final   Special Requests   Final    BOTTLES DRAWN AEROBIC AND ANAEROBIC Blood Culture adequate volume   Culture NO GROWTH 3 DAYS  Final   Report Status PENDING  Incomplete  Culture, blood (Routine x 2)     Status: None (Preliminary result)   Collection Time: 08/11/17  7:44 AM  Result Value Ref Range Status   Specimen Description BLOOD LEFT HAND  Final   Special Requests   Final    BOTTLES DRAWN AEROBIC AND ANAEROBIC Blood Culture adequate volume   Culture NO GROWTH 3 DAYS  Final   Report Status PENDING  Incomplete  Culture, Urine     Status: None   Collection Time: 08/11/17  9:38 AM  Result Value Ref Range Status   Specimen Description URINE, CLEAN CATCH  Final   Special Requests NONE  Final   Culture NO GROWTH  Final   Report Status 08/12/2017 FINAL  Final  Respiratory Panel by PCR     Status: None   Collection Time: 08/11/17 11:31 AM  Result Value Ref Range Status   Adenovirus NOT DETECTED NOT DETECTED Final   Coronavirus 229E NOT DETECTED NOT DETECTED Final   Coronavirus HKU1 NOT DETECTED NOT DETECTED Final   Coronavirus NL63 NOT DETECTED NOT DETECTED Final   Coronavirus OC43 NOT DETECTED NOT DETECTED Final   Metapneumovirus NOT DETECTED NOT DETECTED Final   Rhinovirus / Enterovirus NOT DETECTED NOT DETECTED Final   Influenza A NOT DETECTED NOT DETECTED Final   Influenza B NOT DETECTED NOT DETECTED Final   Parainfluenza Virus 1 NOT DETECTED NOT DETECTED Final   Parainfluenza Virus 2 NOT DETECTED NOT DETECTED Final   Parainfluenza Virus 3 NOT DETECTED NOT DETECTED Final   Parainfluenza Virus 4 NOT DETECTED NOT DETECTED Final   Respiratory Syncytial Virus NOT DETECTED NOT DETECTED Final   Bordetella pertussis NOT DETECTED NOT DETECTED Final   Chlamydophila pneumoniae NOT DETECTED NOT DETECTED Final   Mycoplasma pneumoniae NOT DETECTED NOT DETECTED Final  MRSA PCR Screening     Status: None   Collection Time: 08/11/17  8:20 PM    Result Value Ref Range Status   MRSA by PCR NEGATIVE NEGATIVE Final    Comment:        The GeneXpert MRSA Assay (FDA approved  for NASAL specimens only), is one component of a comprehensive MRSA colonization surveillance program. It is not intended to diagnose MRSA infection nor to guide or monitor treatment for MRSA infections.        Radiology Studies: Dg Abd 1 View  Result Date: 08/15/2017 CLINICAL DATA:  Abdomen pain with vomiting. EXAM: ABDOMEN - 1 VIEW COMPARISON:  None. FINDINGS: The bowel gas pattern is normal. No radio-opaque calculi or other significant radiographic abnormality are seen. IMPRESSION: Negative. Electronically Signed   By: Elsie Stain M.D.   On: 08/15/2017 10:30   Dg Chest Port 1 View  Result Date: 08/15/2017 CLINICAL DATA:  Abdomen pain with vomiting.  Shortness of breath. EXAM: PORTABLE CHEST 1 VIEW COMPARISON:  08/13/2017. FINDINGS: Prior median sternotomy for CABG. Cardiomegaly. Thoracic atherosclerosis. No consolidation or edema. Similar appearance to priors. IMPRESSION: Stable chest.  No active disease. Electronically Signed   By: Elsie Stain M.D.   On: 08/15/2017 10:31      Scheduled Meds: . cloNIDine  0.1 mg Transdermal Q Fri  . heparin  5,000 Units Subcutaneous Q8H  . labetalol  100 mg Oral BID  . sennosides  5 mL Oral QHS   Continuous Infusions: . dextrose 5 % and 0.45% NaCl 50 mL/hr at 08/15/17 0730     LOS: 4 days    Time spent: 30 minutes   Noralee Stain, DO [facility] [email or web address] Password [email or web address] 08/15/2017, 10:36 AM

## 2017-08-16 ENCOUNTER — Inpatient Hospital Stay (HOSPITAL_COMMUNITY): Payer: Medicare Other

## 2017-08-16 ENCOUNTER — Encounter (HOSPITAL_COMMUNITY): Payer: Self-pay | Admitting: Radiology

## 2017-08-16 LAB — CBC
HCT: 40 % (ref 36.0–46.0)
HEMOGLOBIN: 13 g/dL (ref 12.0–15.0)
MCH: 29.7 pg (ref 26.0–34.0)
MCHC: 32.5 g/dL (ref 30.0–36.0)
MCV: 91.5 fL (ref 78.0–100.0)
PLATELETS: 326 10*3/uL (ref 150–400)
RBC: 4.37 MIL/uL (ref 3.87–5.11)
RDW: 13.2 % (ref 11.5–15.5)
WBC: 15.9 10*3/uL — ABNORMAL HIGH (ref 4.0–10.5)

## 2017-08-16 LAB — BASIC METABOLIC PANEL
Anion gap: 12 (ref 5–15)
BUN: 15 mg/dL (ref 6–20)
CHLORIDE: 114 mmol/L — AB (ref 101–111)
CO2: 17 mmol/L — ABNORMAL LOW (ref 22–32)
Calcium: 8.7 mg/dL — ABNORMAL LOW (ref 8.9–10.3)
Creatinine, Ser: 1.54 mg/dL — ABNORMAL HIGH (ref 0.44–1.00)
GFR calc Af Amer: 37 mL/min — ABNORMAL LOW (ref >60)
GFR calc non Af Amer: 32 mL/min — ABNORMAL LOW (ref >60)
GLUCOSE: 123 mg/dL — AB (ref 65–99)
POTASSIUM: 3.2 mmol/L — AB (ref 3.5–5.1)
Sodium: 143 mmol/L (ref 135–145)

## 2017-08-16 LAB — CULTURE, BLOOD (ROUTINE X 2)
Culture: NO GROWTH
Culture: NO GROWTH
SPECIAL REQUESTS: ADEQUATE
Special Requests: ADEQUATE

## 2017-08-16 LAB — PROCALCITONIN: Procalcitonin: 0.37 ng/mL

## 2017-08-16 MED ORDER — LABETALOL HCL 200 MG PO TABS
300.0000 mg | ORAL_TABLET | Freq: Two times a day (BID) | ORAL | Status: DC
  Administered 2017-08-17 – 2017-08-18 (×3): 300 mg via ORAL
  Filled 2017-08-16 (×4): qty 1

## 2017-08-16 MED ORDER — POTASSIUM CHLORIDE 20 MEQ/15ML (10%) PO SOLN
40.0000 meq | Freq: Once | ORAL | Status: AC
  Administered 2017-08-16: 40 meq via ORAL
  Filled 2017-08-16: qty 30

## 2017-08-16 MED ORDER — IOPAMIDOL (ISOVUE-300) INJECTION 61%
15.0000 mL | INTRAVENOUS | Status: AC
  Administered 2017-08-16: 15 mL via ORAL

## 2017-08-16 MED ORDER — IOPAMIDOL (ISOVUE-300) INJECTION 61%
INTRAVENOUS | Status: AC
  Administered 2017-08-16: 75 mL
  Filled 2017-08-16: qty 75

## 2017-08-16 NOTE — Progress Notes (Addendum)
PROGRESS NOTE    Amanda [name]  ZOX:[id number] DOB: 1946/04/01 DOA: 08/11/2017 PCP: Pecola Lawless, MD     Brief Narrative:  Amanda [name] is a 72 yo female with past medical history significant for CKD, gout, history of CVA in 2013, frequent falls, hypertension, hyperlipidemia, chronic constipation, Alzheimer's dementia, nonverbal at baseline, nonambulatory at baseline who was brought to the ED for evaluation due to decreased responsiveness. Per husband at bedside, patient does not speak and does not walk. She lives at [facility] chronically. She had a fever and has not been eating much over the past week. She was admitted for sepsis due to suspected aspiration pneumonia, dehydration and hypernatremia.   Assessment & Plan:   Active Problems:   Essential hypertension, benign   Dementia with behavioral disturbance   CKD (chronic kidney disease) stage 3, GFR 30-59 ml/min (HCC)   Anxiety state   Vitamin D deficiency   Hyperlipidemia   Anemia of chronic disease   History of gout   Chronic constipation   History of stroke   Acute confusional state   Dehydration with hypernatremia   Sepsis (HCC)   Pressure injury of skin   Aspiration pneumonia (HCC)   Palliative care encounter   Sepsis, unclear source  -Upon presentation, patient was tachycardic, tachypneic, fever of 102.7, leukocytosis WBC 12.3.  Initial chest x-ray was unremarkable although patient was quite dehydrated on admission. Initially vanco/zosyn started. Chest x-ray repeated after IV hydration and no acute pulmonary etiology found  -Procalcitonin 0.38  -Respiratory PCR negative -Blood cultures negative to date  -Patient received 4 days of zosyn. She remains afebrile. No bacterial etiology of infection found. Monitoring off antibiotics now   -Had low grade fever 100.5 last night, continues to be tachycardic, WBC trending up. Repeat blood cultures, check procalcitonin again, will obtain CT chest/abd/pelvis to rule out  other causes of fever and sepsis   Hypokalemia -Replace, trend   Vomiting -Episode of vomiting 1/5. AXR and CXR obtained were unremarkable. Resume dysphagia diet. No further vomiting episodes noted.   Sinus tachycardia -Resume labetalol. She takes 300mg  BID, resume today   Hypernatremia in setting of decreased oral intake and dehydration -D5 half-normal saline -Resolved. Trend BMP  AKI on CKD stage III -Baseline creatinine 1.5 -Stable, continue IVF while until oral intake adequate   Essential hypertension -Switch oral hypertensive to catapres patch. Resume labetalol PO. Hydralazine IVF prn   Hyperlipidemia -Hold Lipitor for now until oral intake stable   Aspiration risk -SLP eval. Severe aspiration risk. Diet advanced to dysphagia 1. Husband at bedside will aid with feeding and encouraging intake.   Pressure ulcer, coccyx stage 2, POA -Wound RN consult   Dementia -Nonverbal at baseline   Goals of care -Patient is quite debilitated at baseline with dementia.  Patient has bilateral upper extremity contracture, does not ambulate, nonverbal at baseline.  Palliative care medicine consulted.   DVT prophylaxis: subq hep  Code Status: FULL Family Communication: no family at bedside  Disposition Plan: Pending improvement, back to SNF   Consultants:   Palliative care  Procedures:   None   Antimicrobials:  Anti-infectives (From admission, onward)   Start     Dose/Rate Route Frequency Ordered Stop   08/13/17 0800  vancomycin (VANCOCIN) IVPB 1000 mg/200 mL premix  Status:  Discontinued     1,000 mg 200 mL/hr over 60 Minutes Intravenous Every 48 hours 08/11/17 0904 08/12/17 1242   08/11/17 2200  piperacillin-tazobactam (ZOSYN) IVPB 3.375 g  Status:  Discontinued     3.375 g 12.5 mL/hr over 240 Minutes Intravenous Every 8 hours 08/11/17 2142 08/14/17 1139   08/11/17 1400  piperacillin-tazobactam (ZOSYN) IVPB 2.25 g  Status:  Discontinued     2.25 g 100 mL/hr over 30  Minutes Intravenous Every 8 hours 08/11/17 0904 08/11/17 2142   08/11/17 0800  piperacillin-tazobactam (ZOSYN) IVPB 3.375 g     3.375 g 100 mL/hr over 30 Minutes Intravenous  Once 08/11/17 0754 08/11/17 0854   08/11/17 0800  vancomycin (VANCOCIN) IVPB 1000 mg/200 mL premix  Status:  Discontinued     1,000 mg 200 mL/hr over 60 Minutes Intravenous  Once 08/11/17 0754 08/11/17 0756   08/11/17 0800  vancomycin (VANCOCIN) 1,250 mg in sodium chloride 0.9 % 250 mL IVPB     1,250 mg 166.7 mL/hr over 90 Minutes Intravenous  Once 08/11/17 0756 08/11/17 1127       Subjective: Patient nonverbal. Fever yesterday   Objective: Vitals:   08/16/17 0900 08/16/17 0940 08/16/17 0950 08/16/17 1050  BP:  (!) 160/117  (!) 123/94  Pulse: (!) 116 (!) 118 (!) 118 98  Resp: 18 (!) 29 (!) 23 (!) 32  Temp:      TempSrc:      SpO2: 100% 100% 100% 100%  Weight:      Height:        Intake/Output Summary (Last 24 hours) at 08/16/2017 1131 Last data filed at 08/16/2017 0940 Gross per 24 hour  Intake 2565 ml  Output -  Net 2565 ml   Filed Weights   08/11/17 0731  Weight: 61.2 kg (135 lb)    Examination:  General exam: Appears calm, alert to voice  Respiratory system: Clear to auscultation anteriorly  Cardiovascular system: S1 & S2 heard, tachycardic, regular rhythm. No JVD, murmurs, rubs, gallops or clicks. No pedal edema. Gastrointestinal system: Abdomen is nondistended, soft and nontender. No organomegaly or masses felt. Normal bowel sounds heard. Central nervous system: Alert. Nonverbal, noninteractive.  Extremities: Bilateral upper extremity contractures  Skin: +stage 2 coccyx pressure wound, nondraining  Psychiatry: +Dementia   Data Reviewed: I have personally reviewed following labs and imaging studies  CBC: Recent Labs  Lab 08/11/17 0736  08/13/17 0431 08/14/17 0214 08/14/17 2210 08/15/17 0229 08/16/17 0242  WBC 12.3*   < > 10.2 9.7 11.0* 12.7* 15.9*  NEUTROABS 9.5*  --   --   --   8.5*  --   --   HGB 13.6   < > 12.4 12.7 14.8 15.7* 13.0  HCT 44.3   < > 40.2 39.9 44.4 45.7 40.0  MCV 98.7   < > 96.6 93.9 92.1 91.0 91.5  PLT 285   < > 261 272 PLATELET CLUMPS NOTED ON SMEAR, COUNT APPEARS ADEQUATE 311 326   < > = values in this interval not displayed.   Basic Metabolic Panel: Recent Labs  Lab 08/11/17 1349  08/12/17 0249 08/13/17 0431 08/14/17 0214 08/15/17 0229 08/16/17 0242  NA 156*   < > 156* 150* 148* 139 143  K 3.5   < > 3.9 3.1* 3.5 3.6 3.2*  CL 128*   < > >130* 123* 120* 112* 114*  CO2 20*   < > 15* 18* 18* 17* 17*  GLUCOSE 97   < > 91 135* 115* 129* 123*  BUN 40*   < > 33* 19 9 13 15   CREATININE 1.95*   < > 1.66* 1.51* 1.33* 1.38* 1.54*  CALCIUM 9.2   < >  9.0 8.5* 8.8* 9.0 8.7*  MG 2.5*  --   --  2.0 1.8  --   --   PHOS 3.5  --   --   --   --   --   --    < > = values in this interval not displayed.   GFR: Estimated Creatinine Clearance: 27.3 mL/min (A) (by C-G formula based on SCr of 1.54 mg/dL (H)). Liver Function Tests: Recent Labs  Lab 08/11/17 0736 08/12/17 0249  AST 18 16  ALT 15 12*  ALKPHOS 77 71  BILITOT 0.6 1.0  PROT 8.3* 7.1  ALBUMIN 3.7 3.2*   No results for input(s): LIPASE, AMYLASE in the last 168 hours. No results for input(s): AMMONIA in the last 168 hours. Coagulation Profile: Recent Labs  Lab 08/11/17 0736 08/12/17 0249  INR 1.15 1.16   Cardiac Enzymes: No results for input(s): CKTOTAL, CKMB, CKMBINDEX, TROPONINI in the last 168 hours. BNP (last 3 results) No results for input(s): PROBNP in the last 8760 hours. HbA1C: No results for input(s): HGBA1C in the last 72 hours. CBG: No results for input(s): GLUCAP in the last 168 hours. Lipid Profile: No results for input(s): CHOL, HDL, LDLCALC, TRIG, CHOLHDL, LDLDIRECT in the last 72 hours. Thyroid Function Tests: No results for input(s): TSH, T4TOTAL, FREET4, T3FREE, THYROIDAB in the last 72 hours. Anemia Panel: No results for input(s): VITAMINB12, FOLATE,  FERRITIN, TIBC, IRON, RETICCTPCT in the last 72 hours. Sepsis Labs: Recent Labs  Lab 08/11/17 0755 08/11/17 1349 08/16/17 0821  PROCALCITON  --  0.38 0.37  LATICACIDVEN 1.25 1.3  --     Recent Results (from the past 240 hour(s))  Culture, blood (Routine x 2)     Status: None (Preliminary result)   Collection Time: 08/11/17  7:44 AM  Result Value Ref Range Status   Specimen Description BLOOD RIGHT FOREARM  Final   Special Requests   Final    BOTTLES DRAWN AEROBIC AND ANAEROBIC Blood Culture adequate volume   Culture NO GROWTH 4 DAYS  Final   Report Status PENDING  Incomplete  Culture, blood (Routine x 2)     Status: None (Preliminary result)   Collection Time: 08/11/17  7:44 AM  Result Value Ref Range Status   Specimen Description BLOOD LEFT HAND  Final   Special Requests   Final    BOTTLES DRAWN AEROBIC AND ANAEROBIC Blood Culture adequate volume   Culture NO GROWTH 4 DAYS  Final   Report Status PENDING  Incomplete  Culture, Urine     Status: None   Collection Time: 08/11/17  9:38 AM  Result Value Ref Range Status   Specimen Description URINE, CLEAN CATCH  Final   Special Requests NONE  Final   Culture NO GROWTH  Final   Report Status 08/12/2017 FINAL  Final  Respiratory Panel by PCR     Status: None   Collection Time: 08/11/17 11:31 AM  Result Value Ref Range Status   Adenovirus NOT DETECTED NOT DETECTED Final   Coronavirus 229E NOT DETECTED NOT DETECTED Final   Coronavirus HKU1 NOT DETECTED NOT DETECTED Final   Coronavirus NL63 NOT DETECTED NOT DETECTED Final   Coronavirus OC43 NOT DETECTED NOT DETECTED Final   Metapneumovirus NOT DETECTED NOT DETECTED Final   Rhinovirus / Enterovirus NOT DETECTED NOT DETECTED Final   Influenza A NOT DETECTED NOT DETECTED Final   Influenza B NOT DETECTED NOT DETECTED Final   Parainfluenza Virus 1 NOT DETECTED NOT DETECTED Final  Parainfluenza Virus 2 NOT DETECTED NOT DETECTED Final   Parainfluenza Virus 3 NOT DETECTED NOT DETECTED  Final   Parainfluenza Virus 4 NOT DETECTED NOT DETECTED Final   Respiratory Syncytial Virus NOT DETECTED NOT DETECTED Final   Bordetella pertussis NOT DETECTED NOT DETECTED Final   Chlamydophila pneumoniae NOT DETECTED NOT DETECTED Final   Mycoplasma pneumoniae NOT DETECTED NOT DETECTED Final  MRSA PCR Screening     Status: None   Collection Time: 08/11/17  8:20 PM  Result Value Ref Range Status   MRSA by PCR NEGATIVE NEGATIVE Final    Comment:        The GeneXpert MRSA Assay (FDA approved for NASAL specimens only), is one component of a comprehensive MRSA colonization surveillance program. It is not intended to diagnose MRSA infection nor to guide or monitor treatment for MRSA infections.        Radiology Studies: Dg Abd 1 View  Result Date: 08/15/2017 CLINICAL DATA:  Abdomen pain with vomiting. EXAM: ABDOMEN - 1 VIEW COMPARISON:  None. FINDINGS: The bowel gas pattern is normal. No radio-opaque calculi or other significant radiographic abnormality are seen. IMPRESSION: Negative. Electronically Signed   By: Elsie Stain M.D.   On: 08/15/2017 10:30   Dg Chest Port 1 View  Result Date: 08/15/2017 CLINICAL DATA:  Abdomen pain with vomiting.  Shortness of breath. EXAM: PORTABLE CHEST 1 VIEW COMPARISON:  08/13/2017. FINDINGS: Prior median sternotomy for CABG. Cardiomegaly. Thoracic atherosclerosis. No consolidation or edema. Similar appearance to priors. IMPRESSION: Stable chest.  No active disease. Electronically Signed   By: Elsie Stain M.D.   On: 08/15/2017 10:31      Scheduled Meds: . cloNIDine  0.1 mg Transdermal Q Fri  . heparin  5,000 Units Subcutaneous Q8H  . labetalol  300 mg Oral BID  . sennosides  5 mL Oral QHS   Continuous Infusions: . dextrose 5 % and 0.45% NaCl 50 mL/hr at 08/15/17 2139     LOS: 5 days    Time spent: 30 minutes   Noralee Stain, DO [facility] [email or web address] Password [email or web address] 08/16/2017, 11:31 AM

## 2017-08-16 NOTE — Care Management Important Message (Signed)
Important Message  Patient Details  Name: Amanda [name] MRN: [id number] Date of Birth: [birth year]   Medicare Important Message Given:       Kyla BalzarineShealy, Yohannes Waibel Abena 08/16/2017, 9:28 AM

## 2017-08-17 DIAGNOSIS — Z66 Do not resuscitate: Secondary | ICD-10-CM

## 2017-08-17 DIAGNOSIS — G309 Alzheimer's disease, unspecified: Secondary | ICD-10-CM

## 2017-08-17 DIAGNOSIS — Z515 Encounter for palliative care: Secondary | ICD-10-CM

## 2017-08-17 DIAGNOSIS — Z7189 Other specified counseling: Secondary | ICD-10-CM

## 2017-08-17 DIAGNOSIS — F0280 Dementia in other diseases classified elsewhere without behavioral disturbance: Secondary | ICD-10-CM

## 2017-08-17 DIAGNOSIS — N183 Chronic kidney disease, stage 3 (moderate): Secondary | ICD-10-CM

## 2017-08-17 DIAGNOSIS — G308 Other Alzheimer's disease: Secondary | ICD-10-CM

## 2017-08-17 LAB — CBC
HEMATOCRIT: 39.4 % (ref 36.0–46.0)
Hemoglobin: 13.5 g/dL (ref 12.0–15.0)
MCH: 31.5 pg (ref 26.0–34.0)
MCHC: 34.3 g/dL (ref 30.0–36.0)
MCV: 91.8 fL (ref 78.0–100.0)
Platelets: 296 10*3/uL (ref 150–400)
RBC: 4.29 MIL/uL (ref 3.87–5.11)
RDW: 14.1 % (ref 11.5–15.5)
WBC: 19.4 10*3/uL — AB (ref 4.0–10.5)

## 2017-08-17 LAB — SEDIMENTATION RATE: Sed Rate: 53 mm/hr — ABNORMAL HIGH (ref 0–22)

## 2017-08-17 LAB — BASIC METABOLIC PANEL
ANION GAP: 15 (ref 5–15)
BUN: 17 mg/dL (ref 6–20)
CALCIUM: 8.7 mg/dL — AB (ref 8.9–10.3)
CO2: 14 mmol/L — AB (ref 22–32)
Chloride: 114 mmol/L — ABNORMAL HIGH (ref 101–111)
Creatinine, Ser: 1.48 mg/dL — ABNORMAL HIGH (ref 0.44–1.00)
GFR calc Af Amer: 39 mL/min — ABNORMAL LOW (ref >60)
GFR calc non Af Amer: 34 mL/min — ABNORMAL LOW (ref >60)
GLUCOSE: 102 mg/dL — AB (ref 65–99)
Potassium: 4.2 mmol/L (ref 3.5–5.1)
Sodium: 143 mmol/L (ref 135–145)

## 2017-08-17 LAB — C-REACTIVE PROTEIN: CRP: 16.8 mg/dL — ABNORMAL HIGH (ref <1.0)

## 2017-08-17 LAB — PROCALCITONIN: Procalcitonin: 0.53 ng/mL

## 2017-08-17 NOTE — Consult Note (Signed)
Consultation Note Date: 08/17/2017   Patient Name: Amanda [name]  DOB: 11/17/1945  MRN: [id number]  Age / Sex: 72 y.o., female  PCP: Hendricks Limes, MD Referring Physician: Dessa Phi, DO  Reason for Consultation: Establishing goals of care  HPI/Patient Profile: 72 y.o. female  with past medical history of dissecting ascending thoracic aneurysm, alzheimers dementia, and CVA (no residual) who was admitted on 08/11/2017 with sepsis and fever originally thought to be from aspiration pneumonia.  She was treated and began to improve.  Then in the last 2 days her WBC has climbed to 19 and she is running a low grade fever.  Work up has been unrevealing.  However a CT chest abdomen pelvis did show an increase in the size of her complex dissecting aneurysm which is now calcified and with thrombus.   Clinical Assessment and Goals of Care:  I have reviewed medical records including EPIC notes, labs and imaging, received report from [facility] attending MD, assessed the patient and then met at the bedside along with her husband and son  to discuss diagnosis prognosis, [place], EOL wishes, disposition and options.  I introduced Palliative Medicine as specialized medical care for people living with serious illness. It focuses on providing relief from the symptoms and stress of a serious illness. The goal is to improve quality of life for both the patient and the family.  We discussed a brief life review of the patient. 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  She went to work for the [employer] as stayed for 25 years.  In 2002 she had a stroke and her aneurysm was originally discovered.  She recovered completely from her stroke and went back to work at the [employer].  Amanda [name] and her husband have 2 sons (who live locally), 6 grand children and 1 great grand child.  5 years ago she was  diagnosed with Alzheimers and she has lived at [facility] since.    Her Alzheimers is now advanced she no longer speaks.  She eats some - her husband has fed her 3 meals a day for the past 5 years.  She is bed bound, but seems peaceful and comfortable.  Her husband notes she is sleeping much more and eating much less than she used to eat.   We discussed her current illness and what it means in the larger context of her on-going co-morbidities.  Natural disease trajectory and expectations at EOL were discussed.  The difference between aggressive medical intervention and comfort care was considered.  Mr. Marmo understands that it would be inappropriate to call a "code blue" on Amanda [name] as her aneurysm would likely burst and that would make for a painful traumatic death.  He does not want that for her.    Hospice and Palliative Care services outpatient were explained and offered.  Mr. Locey and his son agreed that it is time to bring in more support from Hospice services.  Initially they would like  to utilize [facility] in long term care.  We also discussed [place] for end of life care.  Questions and concerns were addressed.  The family was encouraged to call with questions or concerns.   Primary Decision Maker:  NEXT OF KIN husband    SUMMARY OF RECOMMENDATIONS    Change code status to DNR.  Patient will need a golden form on discharge.  Engage [facility] on discharge back to long term care.  Patient is unable to rehab given end stage dementia  Please optimize patient as much as possible and return to [place] long term care with Hospice services.  Code Status/Advance Care Planning:  DNR   Prognosis:  Less than 6 months.  End stage dementia, bed bound, not speaking, not smiling, minimal PO intake.    Discharge Planning: [place] with Hospice      Primary Diagnoses: Present on Admission: .  Essential hypertension, benign . Dementia with behavioral disturbance . CKD (chronic kidney disease) stage 3, GFR 30-59 ml/min (HCC) . Anxiety state . Vitamin D deficiency . Hyperlipidemia . Anemia of chronic disease . Chronic constipation . Sepsis ([place])   I have reviewed the medical record, interviewed the patient and family, and examined the patient. The following aspects are pertinent.  Past Medical History:  Diagnosis Date  . Dementia    "alzheimer's" (05/18/2012)  . Fall 05/17/2012  . Fracture of medial wall of orbit ([place]) 05/17/2012  . Gout    "? feet" (05/18/2012); uric acid 9.4 on 08/06/16 with R wrist pain; 7.1 on 08/15/16 on Allopurinol  . Hypertension   . Incontinence of urine   . Stroke [facility]) ~ 2003   "slight memory loss" (05/18/2012)  . Wrist fracture, bilateral 05/17/2012   "fell down steps" (05/18/2012)   Social History   Socioeconomic History  . Marital status: Married    Spouse name: None  . Number of children: None  . Years of education: None  . Highest education level: None  Social Needs  . Financial resource strain: None  . Food insecurity - worry: None  . Food insecurity - inability: None  . Transportation needs - medical: None  . Transportation needs - non-medical: None  Occupational History  . None  Tobacco Use  . Smoking status: Former Smoker    Types: Cigarettes  . Smokeless tobacco: Never Used  . Tobacco comment: Staff at [facility] reports that patient no longer smokes.   Substance and Sexual Activity  . Alcohol use: No    Alcohol/week: 0.0 oz    Comment: Staff at [facility] reports that patient no longer drinks.   . Drug use: No  . Sexual activity: Not Currently  Other Topics Concern  . None  Social History Narrative  . None   Family History  Problem Relation Age of Onset  . Dementia Mother   . Dementia Father   . Dementia Sister   . Dementia Brother    Scheduled Meds: . cloNIDine  0.1 mg Transdermal Q Fri  . heparin  5,000 Units  Subcutaneous Q8H  . labetalol  300 mg Oral BID  . sennosides  5 mL Oral QHS   Continuous Infusions: . dextrose 5 % and 0.45% NaCl 50 mL/hr at 08/16/17 1652   PRN Meds:.acetaminophen, bisacodyl, hydrALAZINE, [DISCONTINUED] ondansetron **OR** ondansetron (ZOFRAN) IV Allergies  Allergen Reactions  . Pollen Extract Other (See Comments)    rhinitis   Review of Systems patient unable to speak.  Physical Exam  Well developed pleasant appearing female, NAD, remains asleep during my visit CV rrr with murmur Resp some snoring, not distress on N/C Abdomen soft, nt, nd  Vital Signs: BP 97/64   Pulse 78   Temp 99 F (37.2 C) (Axillary)   Resp (!) 22   Ht _0  (1.549 m)   Wt 61.2 kg (135 lb)   SpO2 98%   BMI 25.51 kg/m  Pain Assessment: PAINAD   Pain Score: 0-No pain   SpO2: SpO2: 98 % O2 Device:SpO2: 98 % O2 Flow Rate: .   IO: Intake/output summary:   Intake/Output Summary (Last 24 hours) at 08/17/2017 1531 Last data filed at 08/17/2017 0900 Gross per 24 hour  Intake 110 ml  Output -  Net 110 ml    LBM: Last BM Date: 08/13/17 Baseline Weight: Weight: 61.2 kg (135 lb) Most recent weight: Weight: 61.2 kg (135 lb)     Palliative Assessment/Data: 10%     Time In: 2:45 Time Out: 3:50 Time Total: 65 min Greater than 50%  of this time was spent counseling and coordinating care related to the above assessment and plan.  Signed by: Florentina Jenny, PA-C Palliative Medicine Pager: [phone]  Please contact Palliative Medicine Team phone at [phone] for questions and concerns.  For individual provider: See Shea Evans

## 2017-08-17 NOTE — Progress Notes (Signed)
Nutrition Follow-up  DOCUMENTATION CODES:   Not applicable  INTERVENTION:  If family wants aggressive care, recommend placement of cortrak tube otherwise, monitor GOC  Daily Weights  NUTRITION DIAGNOSIS:   Inadequate oral intake related to dysphagia as evidenced by per patient/family report, estimated needs. -ongoing  GOAL:   Patient will meet greater than or equal to 90% of their needs -not meeting  MONITOR:   Diet advancement, Weight trends, I & O's  REASON FOR ASSESSMENT:   Consult Assessment of nutrition requirement/status  ASSESSMENT:   Pt with PMH of Alzheimer's dementia, HTN, HLD, hx of CVA (2013), CKD, and gout presents with decreased responsiveness found to be dehydrated with presumed aspiration pneumonia. Pt nonverbal and nonambulatory at baseline.  01/05 Palliative Care unable to reach family, Diet advanced to NDD1-Honey Thick; vomited with unremarkable AXR and CXR Palliative meeting today a 3pm with husband.  Meal Completion: 10-25%  Labs reviewed  Medications reviewed and include:  D5 1/2 NS at 8550mL/hr --> 204 calories  Diet Order:  DIET - DYS 1 Room service appropriate? Yes; Fluid consistency: Honey Thick  EDUCATION NEEDS:   Not appropriate for education at this time  Skin:  Skin Assessment: Skin Integrity Issues: Skin Integrity Issues:: Stage II Stage II: coccyx  Last BM:  08/13/2017  Height:   Ht Readings from Last 1 Encounters:  08/11/17 5\' 1"  (1.549 m)    Weight:   Wt Readings from Last 1 Encounters:  08/11/17 135 lb (61.2 kg)    Ideal Body Weight:  47.7 kg  BMI:  Body mass index is 25.51 kg/m.  Estimated Nutritional Needs:   Kcal:  1550-1750  Protein:  75-85 grams  Fluid:  >/= 1.5 L/d  Amanda AnoWilliam M. Jessyca Sloan, MS, RD LDN Inpatient Clinical Dietitian Pager [phone]

## 2017-08-17 NOTE — Progress Notes (Signed)
PROGRESS NOTE    Amanda [name]  QQP:[id number] DOB: 1946-07-07 DOA: 08/11/2017 PCP: Hendricks Limes, MD     Brief Narrative:  Amanda [name] is a 72 yo female with past medical history significant for CKD, gout, history of CVA in 2013, frequent falls, hypertension, hyperlipidemia, chronic constipation, Alzheimer's dementia, nonverbal at baseline, nonambulatory at baseline who was brought to the ED for evaluation due to decreased responsiveness. Per husband at bedside, patient does not speak and does not walk. She lives at [facility] chronically. She had a fever and has not been eating much over the past week. She was admitted for sepsis due to suspected aspiration pneumonia, dehydration and hypernatremia.   Assessment & Plan:   Active Problems:   Essential hypertension, benign   Dementia with behavioral disturbance   CKD (chronic kidney disease) stage 3, GFR 30-59 ml/min (HCC)   Anxiety state   Vitamin D deficiency   Hyperlipidemia   Anemia of chronic disease   History of gout   Chronic constipation   History of stroke   Acute confusional state   Dehydration with hypernatremia   Sepsis ([place])   Pressure injury of skin   Aspiration pneumonia ([place])   Palliative care encounter   Sepsis, unclear source  -Upon presentation, patient was tachycardic, tachypneic, fever of 102.7, leukocytosis WBC 12.3.  Initial chest x-ray was unremarkable although patient was quite dehydrated on admission. Initially vanco/zosyn started -Respiratory PCR negative  -Blood cultures negative -Patient received 4 days of zosyn. She remained afebrile. No bacterial etiology of infection found. Antibiotics were discontinued. Then she had low grade fever 100.5, continued to be tachycardic, WBC trending up. Procalcitonin 0.38 --> 0.53. CT chest/abd/pelvis unremarkable for source of infection. Repeat blood cultures are pending. Repeat UA. 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 AXR and CXR obtained were unremarkable. Resume dysphagia diet. No further vomiting episodes noted.   Hypernatremia in setting of decreased oral intake and dehydration -D5 half-normal saline -Resolved. Trend BMP  AKI on CKD stage III -Baseline creatinine 1.5 -Stable, continue IVF while until oral intake adequate   Essential hypertension -Switch oral hypertensive to catapres patch. Resume labetalol PO. Hydralazine IVF prn   Hyperlipidemia -Hold Lipitor for now until oral intake stable   Aspiration risk -SLP eval. Severe aspiration risk. Diet advanced to dysphagia 1  Pressure ulcer, coccyx stage 2, POA -Wound RN consult   Dementia -Nonverbal at baseline   Goals of care -Patient is quite debilitated at baseline with dementia.  Patient has bilateral upper extremity contracture, does not ambulate, nonverbal at baseline.  Palliative care medicine consulted.   DVT prophylaxis: subq hep  Code Status: FULL Family Communication: no family at bedside  Disposition Plan: Pending improvement, back to SNF   Consultants:   Palliative care  Procedures:   None   Antimicrobials:  Anti-infectives (From admission, onward)   Start     Dose/Rate Route Frequency Ordered Stop   08/13/17 0800  vancomycin (VANCOCIN) IVPB 1000 mg/200 mL premix  Status:  Discontinued     1,000 mg 200 mL/hr over 60 Minutes Intravenous Every 48 hours 08/11/17 0904 08/12/17 1242   08/11/17 2200  piperacillin-tazobactam (ZOSYN) IVPB 3.375 g  Status:  Discontinued     3.375 g 12.5 mL/hr over 240 Minutes Intravenous Every 8 hours 08/11/17 2142 08/14/17 1139   08/11/17 1400  piperacillin-tazobactam (ZOSYN) IVPB  2.25 g  Status:  Discontinued     2.25 g 100 mL/hr over 30 Minutes Intravenous Every 8 hours 08/11/17 0904 08/11/17 2142   08/11/17 0800  piperacillin-tazobactam (ZOSYN) IVPB 3.375 g     3.375 g 100 mL/hr over 30 Minutes Intravenous  Once 08/11/17 0754 08/11/17 0854   08/11/17 0800  vancomycin (VANCOCIN) IVPB 1000 mg/200 mL premix  Status:  Discontinued     1,000 mg 200 mL/hr over 60 Minutes Intravenous  Once 08/11/17 0754 08/11/17 0756   08/11/17 0800  vancomycin (VANCOCIN) 1,250 mg in sodium chloride 0.9 % 250 mL IVPB     1,250 mg 166.7 mL/hr over 90 Minutes Intravenous  Once 08/11/17 0756 08/11/17 1127       Subjective: Patient nonverbal.   Objective: Vitals:   08/17/17 0522 08/17/17 0545 08/17/17 0721 08/17/17 0921  BP: 125/83  116/87 126/80  Pulse: (!) 101  (!) 101 96  Resp: (!) 25  (!) 23 (!) 22  Temp:  98.6 F (37 C)    TempSrc:  Axillary    SpO2:   100% 94%  Weight:      Height:        Intake/Output Summary (Last 24 hours) at 08/17/2017 1220 Last data filed at 08/17/2017 0900 Gross per 24 hour  Intake 110 ml  Output -  Net 110 ml   Filed Weights   08/11/17 0731  Weight: 61.2 kg (135 lb)    Examination:  General exam: Appears calm, alert to voice  Respiratory system: Clear to auscultation anteriorly  Cardiovascular system: S1 & S2 heard, RRR. No JVD, murmurs, rubs, gallops or clicks. No pedal edema. Gastrointestinal system: Abdomen is nondistended, soft and nontender. No organomegaly or masses felt. Normal bowel sounds heard. Central nervous system: Alert. Nonverbal, noninteractive.  Extremities: Bilateral upper extremity contractures  Skin: +stage 2 coccyx pressure wound, nondraining  Psychiatry: +Dementia   Data Reviewed: I have personally reviewed following labs and imaging studies  CBC: Recent Labs  Lab 08/11/17 0736  08/14/17 0214 08/14/17 2210 08/15/17 0229 08/16/17 0242 08/17/17 0309  WBC 12.3*   < > 9.7 11.0* 12.7* 15.9* 19.4*  NEUTROABS  9.5*  --   --  8.5*  --   --   --   HGB 13.6   < > 12.7 14.8 15.7* 13.0 13.5  HCT 44.3   < > 39.9 44.4 45.7 40.0 39.4  MCV 98.7   < > 93.9 92.1 91.0 91.5 91.8  PLT 285   < > 272 PLATELET CLUMPS NOTED ON SMEAR, COUNT APPEARS ADEQUATE 311 326 296   < > = values in this interval not displayed.   Basic Metabolic Panel: Recent Labs  Lab 08/11/17 1349  08/13/17 0431 08/14/17 0214 08/15/17 0229 08/16/17 0242 08/17/17 0309  NA 156*   < > 150* 148* 139 143 143  K 3.5   < > 3.1* 3.5 3.6 3.2* 4.2  CL 128*   < > 123* 120* 112* 114* 114*  CO2 20*   < > 18* 18* 17* 17* 14*  GLUCOSE 97   < > 135* 115* 129* 123* 102*  BUN 40*   < > '19 9 13 15 17  ' CREATININE 1.95*   < > 1.51* 1.33* 1.38* 1.54* 1.48*  CALCIUM 9.2   < > 8.5* 8.8*  9.0 8.7* 8.7*  MG 2.5*  --  2.0 1.8  --   --   --   PHOS 3.5  --   --   --   --   --   --    < > = values in this interval not displayed.   GFR: Estimated Creatinine Clearance: 28.4 mL/min (A) (by C-G formula based on SCr of 1.48 mg/dL (H)). Liver Function Tests: Recent Labs  Lab 08/11/17 0736 08/12/17 0249  AST 18 16  ALT 15 12*  ALKPHOS 77 71  BILITOT 0.6 1.0  PROT 8.3* 7.1  ALBUMIN 3.7 3.2*   No results for input(s): LIPASE, AMYLASE in the last 168 hours. No results for input(s): AMMONIA in the last 168 hours. Coagulation Profile: Recent Labs  Lab 08/11/17 0736 08/12/17 0249  INR 1.15 1.16   Cardiac Enzymes: No results for input(s): CKTOTAL, CKMB, CKMBINDEX, TROPONINI in the last 168 hours. BNP (last 3 results) No results for input(s): PROBNP in the last 8760 hours. HbA1C: No results for input(s): HGBA1C in the last 72 hours. CBG: No results for input(s): GLUCAP in the last 168 hours. Lipid Profile: No results for input(s): CHOL, HDL, LDLCALC, TRIG, CHOLHDL, LDLDIRECT in the last 72 hours. Thyroid Function Tests: No results for input(s): TSH, T4TOTAL, FREET4, T3FREE, THYROIDAB in the last 72 hours. Anemia Panel: No results for input(s):  VITAMINB12, FOLATE, FERRITIN, TIBC, IRON, RETICCTPCT in the last 72 hours. Sepsis Labs: Recent Labs  Lab 08/11/17 0755 08/11/17 1349 08/16/17 0821 08/17/17 0736  PROCALCITON  --  0.38 0.37 0.53  LATICACIDVEN 1.25 1.3  --   --     Recent Results (from the past 240 hour(s))  Culture, blood (Routine x 2)     Status: None   Collection Time: 08/11/17  7:44 AM  Result Value Ref Range Status   Specimen Description BLOOD RIGHT FOREARM  Final   Special Requests   Final    BOTTLES DRAWN AEROBIC AND ANAEROBIC Blood Culture adequate volume   Culture NO GROWTH 5 DAYS  Final   Report Status 08/16/2017 FINAL  Final  Culture, blood (Routine x 2)     Status: None   Collection Time: 08/11/17  7:44 AM  Result Value Ref Range Status   Specimen Description BLOOD LEFT HAND  Final   Special Requests   Final    BOTTLES DRAWN AEROBIC AND ANAEROBIC Blood Culture adequate volume   Culture NO GROWTH 5 DAYS  Final   Report Status 08/16/2017 FINAL  Final  Culture, Urine     Status: None   Collection Time: 08/11/17  9:38 AM  Result Value Ref Range Status   Specimen Description URINE, CLEAN CATCH  Final   Special Requests NONE  Final   Culture NO GROWTH  Final   Report Status 08/12/2017 FINAL  Final  Respiratory Panel by PCR     Status: None   Collection Time: 08/11/17 11:31 AM  Result Value Ref Range Status   Adenovirus NOT DETECTED NOT DETECTED Final   Coronavirus 229E NOT DETECTED NOT DETECTED Final   Coronavirus HKU1 NOT DETECTED NOT DETECTED Final   Coronavirus NL63 NOT DETECTED NOT DETECTED Final   Coronavirus OC43 NOT DETECTED NOT DETECTED Final   Metapneumovirus NOT DETECTED NOT DETECTED Final   Rhinovirus / Enterovirus NOT DETECTED NOT DETECTED Final   Influenza A NOT DETECTED NOT DETECTED Final   Influenza B NOT DETECTED NOT DETECTED Final   Parainfluenza Virus 1 NOT DETECTED NOT DETECTED Final  Parainfluenza Virus 2 NOT DETECTED NOT DETECTED Final   Parainfluenza Virus 3 NOT DETECTED  NOT DETECTED Final   Parainfluenza Virus 4 NOT DETECTED NOT DETECTED Final   Respiratory Syncytial Virus NOT DETECTED NOT DETECTED Final   Bordetella pertussis NOT DETECTED NOT DETECTED Final   Chlamydophila pneumoniae NOT DETECTED NOT DETECTED Final   Mycoplasma pneumoniae NOT DETECTED NOT DETECTED Final  MRSA PCR Screening     Status: None   Collection Time: 08/11/17  8:20 PM  Result Value Ref Range Status   MRSA by PCR NEGATIVE NEGATIVE Final    Comment:        The GeneXpert MRSA Assay (FDA approved for NASAL specimens only), is one component of a comprehensive MRSA colonization surveillance program. It is not intended to diagnose MRSA infection nor to guide or monitor treatment for MRSA infections.        Radiology Studies: Ct Chest W Contrast  Result Date: 08/16/2017 CLINICAL DATA:  72 year old with dementia and admitted with sepsis and fever of unknown origin. 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 EXAM: CT CHEST, ABDOMEN, AND PELVIS WITH CONTRAST TECHNIQUE: Multidetector CT imaging of the chest, abdomen and pelvis was performed following the standard protocol during bolus administration of intravenous contrast. CONTRAST:  60m ISOVUE-300 IOPAMIDOL (ISOVUE-300) INJECTION 61% COMPARISON:  CT report from 08/25/2001 FINDINGS: CT CHEST FINDINGS Cardiovascular: There is a large complex aneurysm involving the ascending thoracic aorta. This appears to represent a large pseudo aneurysm arising near the sinotubular junction. This complex aneurysm has a maximum size of 6.5 cm in the craniocaudal dimension measures 4.7 cm in transverse dimension. Aneurysm contains a large amount of thrombus and some calcifications. Prior median sternotomy and the ascending thoracic aorta has likely been replaced with a surgical anastomosis on sequence 3, image 20 near the proximal aortic arch. The ascending thoracic aorta which probably represents a graft appears to be diffusely enlarged  measuring up to 4.7 cm. No evidence for acute hemorrhage or active contrast extravasation. Aortic dissection at the aortic arch that extends into the descending thoracic aorta. Dissection flap involves the left common carotid artery. Limited evaluation of the great vessels on this non CT examination. Dissection also appears to involve the right common carotid artery. Proximal descending thoracic aorta measures 4.3 cm. There appears to be high-density contrast within the false lumen. Coronary arteries are heavily calcified. Mediastinum/Nodes: Small hiatal hernia and suspect wall thickening in the distal esophagus which is nonspecific. Lungs/Pleura: Trachea and mainstem bronchi are patent. Volume loss and atelectasis in the lower lobes. No large pleural effusions. Few subtle peripheral densities in left upper lobe on sequence 4, image 61 and 60 are nonspecific. Otherwise, no significant airspace disease or consolidation. Musculoskeletal: Trachea and mainstem bronchi are patent. CT ABDOMEN PELVIS FINDINGS Hepatobiliary: Normal appearance of the liver, gallbladder and portal venous system. Pancreas: Normal appearance of the pancreas without inflammation or duct dilatation. Spleen: Normal appearance of spleen without enlargement. Adrenals/Urinary Tract: Normal adrenal glands. Left renal cyst. Negative for hydronephrosis. No suspicious renal lesions. Urinary bladder is unremarkable. Stomach/Bowel: Hiatal hernia. A massive amount stool in the transverse colon. No evidence for bowel obstruction or focal bowel inflammation. Vascular/Lymphatic: Aortic dissection extends into the abdominal aorta. The true lumen is heavily calcified. The celiac trunk and SMA appear to originate from the true lumen. Right renal artery probably originates from the false lumen. The true lumen is heavily calcified distally with stenosis. Difficult to evaluate the dissection in the common iliac  arteries. The iliac arteries are heavily calcified. No  significant lymph node enlargement in the abdomen or pelvis. Reproductive: Status post hysterectomy. No adnexal masses. Other: No free fluid.  Negative for free air. Musculoskeletal: Large Schmorl's node involving the superior endplate of Q33. No suspicious bone findings. 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 This complex aneurysm contains a large amount of thrombus and measures up to 6.5 cm. No evidence for rupture or active bleeding. 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 Limited evaluation of this dissection on this non CTA examination. No CT evidence to clearly explain fever of unknown origin. Volume loss in lungs with a few peripheral densities in left upper lobe that are nonspecific. Large amount of stool in the transverse colon. These results were called by telephone at the time of interpretation on 08/16/2017 at 3:09 pm to Dr. Dessa Phi , who verbally acknowledged these results. Electronically Signed   By: Markus Daft M.D.   On: 08/16/2017 15:10   Ct Abdomen Pelvis W Contrast  Result Date: 08/16/2017 CLINICAL DATA:  72 year old with dementia and admitted with sepsis and fever of unknown origin. 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 EXAM: CT CHEST, ABDOMEN, AND PELVIS WITH CONTRAST TECHNIQUE: Multidetector CT imaging of the chest, abdomen and pelvis was performed following the standard protocol during bolus administration of intravenous contrast. 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 This appears to represent a large pseudo aneurysm arising near the sinotubular junction. This complex aneurysm has a maximum size of 6.5 cm in the craniocaudal dimension measures 4.7  cm in transverse dimension. Aneurysm contains a large amount of thrombus and some calcifications. Prior median sternotomy and the ascending thoracic aorta has likely been replaced with a surgical anastomosis on sequence 3, image 20 near the proximal aortic arch. The ascending thoracic aorta which probably represents a graft appears to be diffusely enlarged measuring up to 4.7 cm. No evidence for acute hemorrhage or active contrast extravasation. Aortic dissection at the aortic arch that extends into the descending thoracic aorta. Dissection flap involves the left common carotid artery. Limited evaluation of the great vessels on this non CT examination. Dissection also appears to involve the right common carotid artery. Proximal descending thoracic aorta measures 4.3 cm. There appears to be high-density contrast within the false lumen. Coronary arteries are heavily calcified. Mediastinum/Nodes: Small hiatal hernia and suspect wall thickening in the distal esophagus which is nonspecific. Lungs/Pleura: Trachea and mainstem bronchi are patent. Volume loss and atelectasis in the lower lobes. No large pleural effusions. Few subtle peripheral densities in left upper lobe on sequence 4, image 61 and 60 are nonspecific. Otherwise, no significant airspace disease or consolidation. Musculoskeletal: Trachea and mainstem bronchi are patent. CT ABDOMEN PELVIS FINDINGS Hepatobiliary: Normal appearance of the liver, gallbladder and portal venous system. Pancreas: Normal appearance of the pancreas without inflammation or duct dilatation. Spleen: Normal appearance of spleen without enlargement. Adrenals/Urinary Tract: Normal adrenal glands. Left renal cyst. Negative for hydronephrosis. No suspicious renal lesions. Urinary bladder is unremarkable. Stomach/Bowel: Hiatal hernia. A massive amount stool in the transverse colon. No evidence for bowel obstruction or focal bowel inflammation. Vascular/Lymphatic: Aortic dissection extends  into the abdominal aorta. The true lumen is heavily calcified. The celiac trunk and SMA appear to originate from  the true lumen. Right renal artery probably originates from the false lumen. The true lumen is heavily calcified distally with stenosis. Difficult to evaluate the dissection in the common iliac arteries. The iliac arteries are heavily calcified. No significant lymph node enlargement in the abdomen or pelvis. Reproductive: Status post hysterectomy. No adnexal masses. Other: No free fluid.  Negative for free air. Musculoskeletal: Large Schmorl's node involving the superior endplate of E74. No suspicious bone findings. 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 This complex aneurysm contains a large amount of thrombus and measures up to 6.5 cm. No evidence for rupture or active bleeding. 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 Limited evaluation of this dissection on this non CTA examination. No CT evidence to clearly explain fever of unknown origin. Volume loss in lungs with a few peripheral densities in left upper lobe that are nonspecific. Large amount of stool in the transverse colon. These results were called by telephone at the time of interpretation on 08/16/2017 at 3:09 pm to Dr. Dessa Phi , who verbally acknowledged these results. Electronically Signed   By: Markus Daft M.D.   On: 08/16/2017 15:10      Scheduled Meds: . cloNIDine  0.1 mg Transdermal Q Fri  . heparin  5,000 Units Subcutaneous Q8H  . labetalol  300 mg Oral BID  . sennosides  5 mL Oral QHS   Continuous Infusions: . dextrose 5 % and 0.45% NaCl 50 mL/hr at 08/16/17 1652     LOS: 6 days    Time spent: 30 minutes   Dessa Phi, DO [facility] [email or web address] Password [email or web address] 08/17/2017, 12:20 PM

## 2017-08-17 NOTE — Progress Notes (Signed)
  Speech Language Pathology Treatment: Dysphagia  Patient Details Name: Amanda [name] MRN: [id number] DOB: [birth year] Today's Date: 08/17/2017 Time: [id number] SLP Time Calculation (min) (ACUTE ONLY): 12 min  Assessment / Plan / Recommendation Clinical Impression  Pt was seen for skilled ST targeting dysphagia goals.  Pt was asleep upon arrival but responded to cold compress and ice chips when rubbed on her lips by turning her head towards stimuli.  Eyes remained closed.  Pt only slightly responsive to 1/4 teaspoon boluses of water and would briefly close her lips around spoon; however, when liquids were placed in her mouth they spilled out completely due to poor awareness of bolus.  Thorough oral care was completed to minimize bacterial load without increase in pt's alertness.  Discussed with pt's husband that pt's alertness prohibited safe PO intake at this time.  He verbalized understanding.  Pt may have purees and honey thick liquids as alertness allows; however, can not safely recommend liquids advancement at this time.   Pt left in bed with husband at bedside.    HPI HPI: 72 y.o. female with medical history significant for CKD, gout, history of CVA in 2013, frequent falls, hypertension, hyperlipidemia, chronic constipation, Alzheimer's dementia, nonverbal at baseline, brought to the ED for AMS and sepsis with unclear source. Per chart review, family says that she was eating well up to a few weeks ago, but then she started spitting it out. She was started on a pureed diet shortly before admission.       SLP Plan  Continue with current plan of care       Recommendations  Diet recommendations: Dysphagia 1 (puree);Honey-thick liquid Liquids provided via: Teaspoon;Straw Medication Administration: Crushed with puree Supervision: Full supervision/cueing for compensatory strategies Compensations: Slow rate;Small sips/bites;Monitor for anterior loss Postural Changes and/or Swallow  Maneuvers: Seated upright 90 degrees;Upright 30-60 min after meal                Oral Care Recommendations: Oral care BID Follow up Recommendations: Skilled Nursing facility SLP Visit Diagnosis: Dysphagia, oropharyngeal phase (R13.12) Plan: Continue with current plan of care       GO                Amanda [name], [name] 08/17/2017, 11:47 AM

## 2017-08-17 NOTE — Progress Notes (Signed)
No charge note.  Palliative meeting with husband at 3:00 pm  Norvel RichardsMarianne Najat Olazabal, [place]PA-C Palliative Medicine Pager: [phone]

## 2017-08-18 ENCOUNTER — Inpatient Hospital Stay (HOSPITAL_COMMUNITY): Payer: Medicare Other

## 2017-08-18 DIAGNOSIS — R509 Fever, unspecified: Secondary | ICD-10-CM

## 2017-08-18 LAB — BASIC METABOLIC PANEL
ANION GAP: 8 (ref 5–15)
BUN: 20 mg/dL (ref 6–20)
CHLORIDE: 113 mmol/L — AB (ref 101–111)
CO2: 21 mmol/L — ABNORMAL LOW (ref 22–32)
Calcium: 8.7 mg/dL — ABNORMAL LOW (ref 8.9–10.3)
Creatinine, Ser: 1.54 mg/dL — ABNORMAL HIGH (ref 0.44–1.00)
GFR calc Af Amer: 37 mL/min — ABNORMAL LOW (ref >60)
GFR, EST NON AFRICAN AMERICAN: 32 mL/min — AB (ref >60)
GLUCOSE: 120 mg/dL — AB (ref 65–99)
POTASSIUM: 3.6 mmol/L (ref 3.5–5.1)
Sodium: 142 mmol/L (ref 135–145)

## 2017-08-18 LAB — CBC
HCT: 32.9 % — ABNORMAL LOW (ref 36.0–46.0)
HEMOGLOBIN: 10.5 g/dL — AB (ref 12.0–15.0)
MCH: 29.7 pg (ref 26.0–34.0)
MCHC: 31.9 g/dL (ref 30.0–36.0)
MCV: 92.9 fL (ref 78.0–100.0)
PLATELETS: 341 10*3/uL (ref 150–400)
RBC: 3.54 MIL/uL — AB (ref 3.87–5.11)
RDW: 14 % (ref 11.5–15.5)
WBC: 16 10*3/uL — AB (ref 4.0–10.5)

## 2017-08-18 NOTE — Progress Notes (Signed)
Amanda [name] to be [facility] per MD order. Report called to Nurse Ramatou.   Allergies as of 08/18/2017      Reactions   Pollen Extract Other (See Comments)   rhinitis      Medication List    TAKE these medications   acetaminophen 325 MG tablet Commonly known as:  TYLENOL Take 650 mg by mouth every 6 (six) hours as needed for moderate pain or fever.   allopurinol 150 mg Tabs tablet Commonly known as:  ZYLOPRIM Take 150 mg by mouth daily.   AMBULATORY NON FORMULARY MEDICATION Take 1 each by mouth 2 (two) times daily. Give 1 magic cup by mouth twice a day for supplement.   atorvastatin 10 MG tablet Commonly known as:  LIPITOR Take 10 mg by mouth daily.   CALCIUM-VITAMIN D PO Take 500 mg by mouth daily.   cloNIDine 0.1 MG tablet Commonly known as:  CATAPRES Take 0.1-0.2 mg by mouth See admin instructions. Take 0.2 mg QAM, 0.1 mg QPM.  Hold for SBP <120   COLACE PO Take 100 mg by mouth 2 (two) times daily.   ELDERTONIC PO Take 15 mLs by mouth 2 (two) times daily.   GRX HYDROCOLLOID DRESSING EX Apply 1 application topically as needed (for soilage).   labetalol 300 MG tablet Commonly known as:  NORMODYNE Take 300 mg by mouth 2 (two) times daily.   LINZESS 72 MCG capsule Generic drug:  linaclotide Take 72 mcg by mouth every other day.   polyethylene glycol packet Commonly known as:  MIRALAX / GLYCOLAX Take 17 g by mouth daily as needed.       VVS, Skin clean, dry and intact without evidence of skin break down, no evidence of skin tears noted.  IV catheter discontinued intact. Site without signs and symptoms of complications. Dressing and pressure applied.  An After Visit Summary was printed and given to [facility].  [facility].  Amanda [name]  08/18/2017 1:42 PM

## 2017-08-18 NOTE — Progress Notes (Signed)
  Speech Language Pathology Treatment: Dysphagia  Patient Details Name: Amanda [name] MRN: [id number] DOB: 1946/04/04 Today's Date: 08/18/2017 Time: [id number] SLP Time Calculation (min) (ACUTE ONLY): 17 min  Assessment / Plan / Recommendation Clinical Impression  Pt was seen for skilled ST targeting family education in light of palliative care consult yesterday.  Pt was asleep upon arrival and pt's husband was at bedside reporting that pt has been only minimally responsive since yesterday.  Discussed goals of care specific to speech therapy and pt's husband very clearly expressed that he and his family feel that pt is near end of life and they want to focus on comfort rather than rehab of her swallowing function.  SLP discussed the role of comfort feeds versus feeding for nutrition and pt's husband verbalized understanding and agreement with recommendations of transitioning pt to comfort feeds as she moves to hospice.  All questions were answered to his satisfaction at this time.  Education is complete and no further ST needs are indicated at this time.     HPI HPI: 72 y.o. female with medical history significant for CKD, gout, history of CVA in 2013, frequent falls, hypertension, hyperlipidemia, chronic constipation, Alzheimer's dementia, nonverbal at baseline, brought to the ED for AMS and sepsis with unclear source. Per chart review, family says that she was eating well up to a few weeks ago, but then she started spitting it out. She was started on a pureed diet shortly before admission.       SLP Plan  Discharge SLP treatment due to (comment)(pt transitioning to Hospice, family wants comfort measures)       Recommendations  Diet recommendations: Dysphagia 1 (puree);Honey-thick liquid Liquids provided via: Teaspoon Medication Administration: Crushed with puree Supervision: Full supervision/cueing for compensatory strategies Compensations: Slow rate;Small sips/bites;Monitor for  anterior loss Postural Changes and/or Swallow Maneuvers: Seated upright 90 degrees;Upright 30-60 min after meal                Oral Care Recommendations: Oral care BID Follow up Recommendations: Skilled Nursing facility SLP Visit Diagnosis: Dysphagia, oropharyngeal phase (R13.12) Plan: Discharge SLP treatment due to (comment)(pt transitioning to Hospice, family wants comfort measures)       GO                Amanda [name], Amanda [name] 08/18/2017, 11:02 AM

## 2017-08-18 NOTE — Progress Notes (Signed)
LE venous duplex prelim: RLE negative for DVT. Difficult evaluation of LLE due to patient's contracted position, appears to be DVT in the left distal femoral and popliteal veins. Not all veins of LLE could be evaluated. Farrel DemarkJill Eunice, RDMS, RVT Called results to Mickle MalloryElisa, RN

## 2017-08-18 NOTE — Care Management Note (Signed)
Case Management Note  Patient Details  Name: Amanda CoxDeloise P Kriz MRN: [id number] Date of Birth: 04/19/1946  Subjective/Objective:       Admitted with sepsis, AMS/ fever, hx of CVA, advanced dementia, and HTN.  From SNF([facility]).    Rogelia BogaClarence Blumenberg (Spouse) Carlyn ReichertClarence J Wildeman (Son)    [phone]     PCP: Marga MelnickWilliam Hopper  Action/Plan: Transition to SNF with hospice care. CSW managing disposition to facility.  Expected Discharge Date:  08/18/17               Expected Discharge Plan:  Skilled Nursing Facility  In-House Referral:  Clinical Social Work  Discharge planning Services  CM Consult   Status of Service:  Completed, signed off  If discussed at [employer] Length of Stay Meetings, dates discussed:    Additional Comments:  Epifanio LeschesCole, Kelsey Durflinger Hudson, RN 08/18/2017, 10:48 AM

## 2017-08-18 NOTE — Progress Notes (Signed)
Patient will DC to: [facility] Anticipated DC date: 08/18/17 Family notified: Son and spouse Transport by: Sharin MonsPTAR   Per MD [facility]. RN, patient, patient's family, and facility notified of DC. Discharge Summary sent to facility. RN given number for report. DC packet on chart. Ambulance transport requested for patient.   CSW signing off.  Cristobal GoldmannNadia Victorious Kundinger, [place] Clinical Social Worker [phone]

## 2017-08-18 NOTE — Progress Notes (Signed)
Pt did not urinate since 3pm on 08/17/2017, bladder scan indicated 107ml of urine. Jomarie LongsJoseph, MD notified.

## 2017-08-18 NOTE — Discharge Summary (Signed)
Physician Discharge Summary  Amanda [name] UEA:[id number] DOB: November 14, 1945 DOA: 08/11/2017  PCP: Pecola Lawless, MD  Admit date: 08/11/2017 Discharge date: 08/18/2017  Time spent: 35 minutes  Recommendations for Outpatient Follow-up:  SNF with Hospice /comfort focused care  Discharge Diagnoses:    Advanced Dementia   Dysphagia   Essential hypertension, benign   Dementia with behavioral disturbance   CKD (chronic kidney disease) stage 3, GFR 30-59 ml/min (HCC)   Anxiety state   Vitamin D deficiency   Hyperlipidemia   Anemia of chronic disease   History of gout   Chronic constipation   History of stroke   Acute confusional state   Dehydration with hypernatremia   Sepsis (HCC)   Pressure injury of skin   Aspiration pneumonia (HCC)   Palliative care encounter   Alzheimer disease   DNR (do not resuscitate)   Encounter for hospice care discussion   Discharge Condition: poor  Diet recommendation: Dysphagia 1 (puree);Honey-thick liquid vs comfort feeds  Filed Weights   08/11/17 0731  Weight: 61.2 kg (135 lb)    History of present illness:  Amanda [name] is a 72 yo female with past medical history significant forCKD, gout, history of CVA in 2013, frequent falls, hypertension, hyperlipidemia, chronic constipation, Alzheimer's dementia, nonverbal at baseline, nonambulatory at baseline who was brought to the ED for evaluation due to decreased responsiveness. Per husband at bedside, patient does not speak and does not walk. She lives at [facility] chronically. She had a fever and has not been eating much over the past week. She was admitted for sepsis due to suspected aspiration pneumonia, dehydration and hypernatremia  Hospital Course:   Sepsis, unclear source  -Upon presentation, patient was tachycardic, tachypneic, fever of 102.7, leukocytosis WBC 12.3.  Initial chest x-ray was unremarkable although patient was quite dehydrated on admission. Initially vanco/zosyn  started -Respiratory PCR negative  -Blood cultures negative -Patient received 4 days of zosyn. She remained afebrile. No bacterial etiology of infection found. Antibiotics were discontinued. Then she had low grade fever 100.5, continued to be tachycardic, WBC trending up. Procalcitonin 0.38 --> 0.53. CT chest/abd/pelvis unremarkable for source of infection. 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 -now plan for comfort focused care with Hospice  Vomiting -Episode of vomiting 1/5. AXR and CXR obtained were unremarkable. Resumed dysphagia diet. No further vomiting episodes noted.   Hypernatremia in setting of decreased oral intake and dehydration -hydrated with D5 half-normal saline -Resolved.  AKI on CKD stage III -Baseline creatinine 1.5 -Stable, stopped IVF now  Essential hypertension -resumed home regimen of clonidine and labetalol  Aspiration risk -SLP eval. Severe aspiration risk. Diet changed to dysphagia 1 with thickened liquids  Pressure ulcer, coccyx stage 2, POA -Wound RN consult appreciated  Dementia -Nonverbal at baseline   Goals of care -Patient is quite debilitated at baseline with dementia.  Patient has bilateral upper extremity contracture, does not ambulate, nonverbal at baseline.  Palliative care medicine consulted, s/p Family meeting now DNR and Plan for SNF  with Hospice for Comfort focused end of life  care   Consultations:  Palliative medicine  Discharge Exam: Vitals:   08/18/17 0525 08/18/17 0909  BP: 130/72 137/64  Pulse: 80 81  Resp: 18   Temp: 97.7 F (36.5 C)   SpO2: 99%     General: Somnolent, arousable, confused Cardiovascular: S1S2/RRR Respiratory: decreased BS at bases  Discharge Instructions   Discharge Instructions    Increase activity slowly   Complete by:  As directed      Allergies as of 08/18/2017      Reactions   Pollen Extract Other (See Comments)   rhinitis      Medication List    TAKE these medications   acetaminophen 325 MG tablet Commonly known as:  TYLENOL Take 650 mg by mouth every 6 (six) hours as needed for moderate pain or fever.   allopurinol 150 mg Tabs tablet Commonly known as:  ZYLOPRIM Take 150 mg by mouth daily.   AMBULATORY NON FORMULARY MEDICATION Take 1 each by mouth 2 (two) times daily. Give 1 magic cup by mouth twice a day for supplement.   atorvastatin 10 MG tablet Commonly known as:  LIPITOR Take 10 mg by mouth daily.   CALCIUM-VITAMIN D PO Take 500 mg by mouth daily.   cloNIDine 0.1 MG tablet Commonly known as:  CATAPRES Take 0.1-0.2 mg by mouth See admin instructions. Take 0.2 mg QAM, 0.1 mg QPM.  Hold for SBP <120   COLACE PO Take 100 mg by mouth 2 (two) times daily.   ELDERTONIC PO Take 15 mLs by mouth 2 (two) times daily.   GRX HYDROCOLLOID DRESSING EX Apply 1 application topically as needed (for soilage).   labetalol 300 MG tablet Commonly known as:  NORMODYNE Take 300 mg by mouth 2 (two) times daily.   LINZESS 72 MCG capsule Generic drug:  linaclotide Take 72 mcg by mouth every other day.   polyethylene glycol packet Commonly known as:  MIRALAX / GLYCOLAX Take 17 g by mouth daily as needed.      Allergies  Allergen Reactions  . Pollen Extract Other (See Comments)    rhinitis   Follow-up Information    Pecola Lawless, MD. Schedule an appointment as soon as possible for a visit  in 1 week(s).   Specialty:  Internal Medicine Contact information: [address] [place] [address] [phone]            The results of significant diagnostics from this hospitalization (including imaging, microbiology, ancillary and laboratory) are listed below for reference.    Significant Diagnostic Studies: Dg Abd 1 View  Result Date: 08/15/2017 CLINICAL DATA:  Abdomen pain with vomiting. EXAM: ABDOMEN - 1 VIEW COMPARISON:  None. FINDINGS: The bowel gas pattern is normal. No radio-opaque calculi or other significant radiographic abnormality are seen. IMPRESSION: Negative. Electronically Signed   By: Elsie Stain M.D.   On: 08/15/2017 10:30   Ct Chest W Contrast  Result Date: 08/16/2017 CLINICAL DATA:  72 year old with dementia and admitted with sepsis and fever of unknown origin. 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 EXAM: CT CHEST, ABDOMEN, AND PELVIS WITH CONTRAST TECHNIQUE: Multidetector CT imaging of the chest, abdomen and pelvis was performed following the standard protocol during bolus administration of intravenous contrast. 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 This appears to  represent a large pseudo aneurysm arising near the sinotubular junction. This complex aneurysm has a maximum size of 6.5 cm in the craniocaudal dimension measures 4.7 cm in transverse dimension. Aneurysm contains a large amount of thrombus and some calcifications. Prior median sternotomy and the ascending thoracic aorta has likely been replaced with a surgical anastomosis on sequence 3, image 20 near the proximal aortic arch. The ascending thoracic aorta which probably represents a graft appears to be diffusely enlarged measuring up to 4.7 cm. No evidence for acute hemorrhage or active contrast  extravasation. Aortic dissection at the aortic arch that extends into the descending thoracic aorta. Dissection flap involves the left common carotid artery. Limited evaluation of the great vessels on this non CT examination. Dissection also appears to involve the right common carotid artery. Proximal descending thoracic aorta measures 4.3 cm. There appears to be high-density contrast within the false lumen. Coronary arteries are heavily calcified. Mediastinum/Nodes: Small hiatal hernia and suspect wall thickening in the distal esophagus which is nonspecific. Lungs/Pleura: Trachea and mainstem bronchi are patent. Volume loss and atelectasis in the lower lobes. No large pleural effusions. Few subtle peripheral densities in left upper lobe on sequence 4, image 61 and 60 are nonspecific. Otherwise, no significant airspace disease or consolidation. Musculoskeletal: Trachea and mainstem bronchi are patent. CT ABDOMEN PELVIS FINDINGS Hepatobiliary: Normal appearance of the liver, gallbladder and portal venous system. Pancreas: Normal appearance of the pancreas without inflammation or duct dilatation. Spleen: Normal appearance of spleen without enlargement. Adrenals/Urinary Tract: Normal adrenal glands. Left renal cyst. Negative for hydronephrosis. No suspicious renal lesions. Urinary bladder is unremarkable. Stomach/Bowel: Hiatal hernia. A massive amount stool in the transverse colon. No evidence for bowel obstruction or focal bowel inflammation. Vascular/Lymphatic: Aortic dissection extends into the abdominal aorta. The true lumen is heavily calcified. The celiac trunk and SMA appear to originate from the true lumen. Right renal artery probably originates from the false lumen. The true lumen is heavily calcified distally with stenosis. Difficult to evaluate the dissection in the common iliac arteries. The iliac arteries are heavily calcified. No significant lymph node enlargement in the abdomen or pelvis. Reproductive:  Status post hysterectomy. No adnexal masses. Other: No free fluid.  Negative for free air. Musculoskeletal: Large Schmorl's node involving the superior endplate of T12. No suspicious bone findings. 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 This complex aneurysm contains a large amount of thrombus and measures up to 6.5 cm. No evidence for rupture or active bleeding. 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 Limited evaluation of this dissection on this non CTA examination. No CT evidence to clearly explain fever of unknown origin. Volume loss in lungs with a few peripheral densities in left upper lobe that are nonspecific. Large amount of stool in the transverse colon. These results were called by telephone at the time of interpretation on 08/16/2017 at 3:09 pm to Dr. Noralee Stain , who verbally acknowledged these results. Electronically Signed   By: Richarda Overlie M.D.   On: 08/16/2017 15:10   Ct Abdomen Pelvis W Contrast  Result Date: 08/16/2017 CLINICAL DATA:  72 year old with dementia and admitted with sepsis and fever of unknown origin. 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 EXAM: CT CHEST, ABDOMEN, AND PELVIS WITH CONTRAST TECHNIQUE: Multidetector CT imaging of the chest, abdomen and pelvis was performed following the standard protocol during bolus administration of intravenous contrast. CONTRAST:  75mL ISOVUE-300  IOPAMIDOL (ISOVUE-300) INJECTION 61% COMPARISON:  CT report from 08/25/2001 FINDINGS: CT CHEST FINDINGS Cardiovascular: There is a large complex aneurysm involving the ascending thoracic aorta. This appears to represent a large pseudo aneurysm arising near the sinotubular junction. This complex aneurysm has a maximum size of 6.5 cm in the craniocaudal dimension measures 4.7 cm in transverse dimension. Aneurysm contains a large amount of thrombus and  some calcifications. Prior median sternotomy and the ascending thoracic aorta has likely been replaced with a surgical anastomosis on sequence 3, image 20 near the proximal aortic arch. The ascending thoracic aorta which probably represents a graft appears to be diffusely enlarged measuring up to 4.7 cm. No evidence for acute hemorrhage or active contrast extravasation. Aortic dissection at the aortic arch that extends into the descending thoracic aorta. Dissection flap involves the left common carotid artery. Limited evaluation of the great vessels on this non CT examination. Dissection also appears to involve the right common carotid artery. Proximal descending thoracic aorta measures 4.3 cm. There appears to be high-density contrast within the false lumen. Coronary arteries are heavily calcified. Mediastinum/Nodes: Small hiatal hernia and suspect wall thickening in the distal esophagus which is nonspecific. Lungs/Pleura: Trachea and mainstem bronchi are patent. Volume loss and atelectasis in the lower lobes. No large pleural effusions. Few subtle peripheral densities in left upper lobe on sequence 4, image 61 and 60 are nonspecific. Otherwise, no significant airspace disease or consolidation. Musculoskeletal: Trachea and mainstem bronchi are patent. CT ABDOMEN PELVIS FINDINGS Hepatobiliary: Normal appearance of the liver, gallbladder and portal venous system. Pancreas: Normal appearance of the pancreas without inflammation or duct dilatation. Spleen: Normal appearance of spleen without enlargement. Adrenals/Urinary Tract: Normal adrenal glands. Left renal cyst. Negative for hydronephrosis. No suspicious renal lesions. Urinary bladder is unremarkable. Stomach/Bowel: Hiatal hernia. A massive amount stool in the transverse colon. No evidence for bowel obstruction or focal bowel inflammation. Vascular/Lymphatic: Aortic dissection extends into the abdominal aorta. The true lumen is heavily calcified. The celiac trunk  and SMA appear to originate from the true lumen. Right renal artery probably originates from the false lumen. The true lumen is heavily calcified distally with stenosis. Difficult to evaluate the dissection in the common iliac arteries. The iliac arteries are heavily calcified. No significant lymph node enlargement in the abdomen or pelvis. Reproductive: Status post hysterectomy. No adnexal masses. Other: No free fluid.  Negative for free air. Musculoskeletal: Large Schmorl's node involving the superior endplate of T12. No suspicious bone findings. 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 This complex aneurysm contains a large amount of thrombus and measures up to 6.5 cm. No evidence for rupture or active bleeding. 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 Limited evaluation of this dissection on this non CTA examination. No CT evidence to clearly explain fever of unknown origin. Volume loss in lungs with a few peripheral densities in left upper lobe that are nonspecific. Large amount of stool in the transverse colon. These results were called by telephone at the time of interpretation on 08/16/2017 at 3:09 pm to Dr. Noralee Stain , who verbally acknowledged these results. Electronically Signed   By: Richarda Overlie M.D.   On: 08/16/2017 15:10   Dg Chest Port 1 View  Result Date: 08/15/2017 CLINICAL DATA:  Abdomen pain with vomiting.  Shortness of breath. EXAM: PORTABLE CHEST 1 VIEW COMPARISON:  08/13/2017. FINDINGS: Prior median sternotomy for CABG. Cardiomegaly. Thoracic atherosclerosis. No consolidation  or edema. Similar appearance to priors. IMPRESSION: Stable chest.  No active disease. Electronically Signed   By: Elsie Stain M.D.   On: 08/15/2017 10:31   Dg Chest Port 1 View  Result Date: 08/13/2017 CLINICAL DATA:  Shortness of breath. EXAM: PORTABLE CHEST 1 VIEW COMPARISON:   08/11/2017. FINDINGS: Prior CABG. Cardiomegaly with normal pulmonary vascularity. No focal infiltrate. No pleural effusion or pneumothorax. No acute bony abnormality . IMPRESSION: 1. Prior CABG.  Cardiomegaly.  No pulmonary venous congestion. 2. No acute pulmonary disease. Electronically Signed   By: Maisie Fus  Register   On: 08/13/2017 09:09   Dg Chest Portable 1 View  Result Date: 08/11/2017 CLINICAL DATA:  Sepsis EXAM: PORTABLE CHEST 1 VIEW COMPARISON:  05/17/2012 FINDINGS: Cardiac shadow is enlarged. Postsurgical changes are again noted. Tortuous vascularity is seen and stable. The lungs are clear bilaterally. No bony abnormality is seen. IMPRESSION: No acute abnormality noted. Electronically Signed   By: Alcide Clever M.D.   On: 08/11/2017 08:13    Microbiology: Recent Results (from the past 240 hour(s))  Culture, blood (Routine x 2)     Status: None   Collection Time: 08/11/17  7:44 AM  Result Value Ref Range Status   Specimen Description BLOOD RIGHT FOREARM  Final   Special Requests   Final    BOTTLES DRAWN AEROBIC AND ANAEROBIC Blood Culture adequate volume   Culture NO GROWTH 5 DAYS  Final   Report Status 08/16/2017 FINAL  Final  Culture, blood (Routine x 2)     Status: None   Collection Time: 08/11/17  7:44 AM  Result Value Ref Range Status   Specimen Description BLOOD LEFT HAND  Final   Special Requests   Final    BOTTLES DRAWN AEROBIC AND ANAEROBIC Blood Culture adequate volume   Culture NO GROWTH 5 DAYS  Final   Report Status 08/16/2017 FINAL  Final  Culture, Urine     Status: None   Collection Time: 08/11/17  9:38 AM  Result Value Ref Range Status   Specimen Description URINE, CLEAN CATCH  Final   Special Requests NONE  Final   Culture NO GROWTH  Final   Report Status 08/12/2017 FINAL  Final  Respiratory Panel by PCR     Status: None   Collection Time: 08/11/17 11:31 AM  Result Value Ref Range Status   Adenovirus NOT DETECTED NOT DETECTED Final   Coronavirus 229E NOT  DETECTED NOT DETECTED Final   Coronavirus HKU1 NOT DETECTED NOT DETECTED Final   Coronavirus NL63 NOT DETECTED NOT DETECTED Final   Coronavirus OC43 NOT DETECTED NOT DETECTED Final   Metapneumovirus NOT DETECTED NOT DETECTED Final   Rhinovirus / Enterovirus NOT DETECTED NOT DETECTED Final   Influenza A NOT DETECTED NOT DETECTED Final   Influenza B NOT DETECTED NOT DETECTED Final   Parainfluenza Virus 1 NOT DETECTED NOT DETECTED Final   Parainfluenza Virus 2 NOT DETECTED NOT DETECTED Final   Parainfluenza Virus 3 NOT DETECTED NOT DETECTED Final   Parainfluenza Virus 4 NOT DETECTED NOT DETECTED Final   Respiratory Syncytial Virus NOT DETECTED NOT DETECTED Final   Bordetella pertussis NOT DETECTED NOT DETECTED Final   Chlamydophila pneumoniae NOT DETECTED NOT DETECTED Final   Mycoplasma pneumoniae NOT DETECTED NOT DETECTED Final  MRSA PCR Screening     Status: None   Collection Time: 08/11/17  8:20 PM  Result Value Ref Range Status   MRSA by PCR NEGATIVE NEGATIVE Final    Comment:  The GeneXpert MRSA Assay (FDA approved for NASAL specimens only), is one component of a comprehensive MRSA colonization surveillance program. It is not intended to diagnose MRSA infection nor to guide or monitor treatment for MRSA infections.   Culture, blood (routine x 2)     Status: None (Preliminary result)   Collection Time: 08/16/17  8:33 AM  Result Value Ref Range Status   Specimen Description BLOOD LEFT HAND  Final   Special Requests IN PEDIATRIC BOTTLE Blood Culture adequate volume  Final   Culture NO GROWTH 1 DAY  Final   Report Status PENDING  Incomplete  Culture, blood (routine x 2)     Status: None (Preliminary result)   Collection Time: 08/16/17  8:37 AM  Result Value Ref Range Status   Specimen Description BLOOD LEFT HAND  Final   Special Requests IN PEDIATRIC BOTTLE Blood Culture adequate volume  Final   Culture NO GROWTH 1 DAY  Final   Report Status PENDING  Incomplete      Labs: Basic Metabolic Panel: Recent Labs  Lab 08/11/17 1349  08/13/17 0431 08/14/17 0214 08/15/17 0229 08/16/17 0242 08/17/17 0309 08/18/17 0223  NA 156*   < > 150* 148* 139 143 143 142  K 3.5   < > 3.1* 3.5 3.6 3.2* 4.2 3.6  CL 128*   < > 123* 120* 112* 114* 114* 113*  CO2 20*   < > 18* 18* 17* 17* 14* 21*  GLUCOSE 97   < > 135* 115* 129* 123* 102* 120*  BUN 40*   < > 19 9 13 15 17 20   CREATININE 1.95*   < > 1.51* 1.33* 1.38* 1.54* 1.48* 1.54*  CALCIUM 9.2   < > 8.5* 8.8* 9.0 8.7* 8.7* 8.7*  MG 2.5*  --  2.0 1.8  --   --   --   --   PHOS 3.5  --   --   --   --   --   --   --    < > = values in this interval not displayed.   Liver Function Tests: Recent Labs  Lab 08/12/17 0249  AST 16  ALT 12*  ALKPHOS 71  BILITOT 1.0  PROT 7.1  ALBUMIN 3.2*   No results for input(s): LIPASE, AMYLASE in the last 168 hours. No results for input(s): AMMONIA in the last 168 hours. CBC: Recent Labs  Lab 08/14/17 2210 08/15/17 0229 08/16/17 0242 08/17/17 0309 08/18/17 0223  WBC 11.0* 12.7* 15.9* 19.4* 16.0*  NEUTROABS 8.5*  --   --   --   --   HGB 14.8 15.7* 13.0 13.5 10.5*  HCT 44.4 45.7 40.0 39.4 32.9*  MCV 92.1 91.0 91.5 91.8 92.9  PLT PLATELET CLUMPS NOTED ON SMEAR, COUNT APPEARS ADEQUATE 311 326 296 341   Cardiac Enzymes: No results for input(s): CKTOTAL, CKMB, CKMBINDEX, TROPONINI in the last 168 hours. BNP: BNP (last 3 results) No results for input(s): BNP in the last 8760 hours.  ProBNP (last 3 results) No results for input(s): PROBNP in the last 8760 hours.  CBG: No results for input(s): GLUCAP in the last 168 hours.     Signed:  Zannie CovePreetha Zarah Carbon MD.  [facility] 08/18/2017, 10:38 AM

## 2017-08-19 ENCOUNTER — Non-Acute Institutional Stay (SKILLED_NURSING_FACILITY): Payer: Medicare Other | Admitting: Adult Health

## 2017-08-19 ENCOUNTER — Telehealth: Payer: Self-pay

## 2017-08-19 ENCOUNTER — Encounter: Payer: Self-pay | Admitting: Adult Health

## 2017-08-19 DIAGNOSIS — K5909 Other constipation: Secondary | ICD-10-CM

## 2017-08-19 DIAGNOSIS — F0280 Dementia in other diseases classified elsewhere without behavioral disturbance: Secondary | ICD-10-CM

## 2017-08-19 DIAGNOSIS — Z8739 Personal history of other diseases of the musculoskeletal system and connective tissue: Secondary | ICD-10-CM

## 2017-08-19 DIAGNOSIS — G309 Alzheimer's disease, unspecified: Secondary | ICD-10-CM

## 2017-08-19 DIAGNOSIS — A419 Sepsis, unspecified organism: Secondary | ICD-10-CM | POA: Diagnosis not present

## 2017-08-19 DIAGNOSIS — R1310 Dysphagia, unspecified: Secondary | ICD-10-CM

## 2017-08-19 DIAGNOSIS — I719 Aortic aneurysm of unspecified site, without rupture: Secondary | ICD-10-CM | POA: Diagnosis not present

## 2017-08-19 DIAGNOSIS — E782 Mixed hyperlipidemia: Secondary | ICD-10-CM

## 2017-08-19 DIAGNOSIS — I10 Essential (primary) hypertension: Secondary | ICD-10-CM | POA: Diagnosis not present

## 2017-08-19 NOTE — Progress Notes (Signed)
Location:  [facility] Room Number: 117-A Place of Service:  SNF (31) Provider:  Kenard Gower, NP  Patient Care Team: Pecola Lawless, MD as PCP - General (Internal Medicine) Gillis Santa, NP as Nurse Practitioner (Internal Medicine)  Extended Emergency Contact Information Primary Emergency Contact: [name],Amanda Address: [address]          [place], [place] [address] [name] [place] of [place] Home Phone: [phone] Mobile Phone: [phone] Relation: Spouse Secondary Emergency Contact: Amanda [name] [place] of [place] Mobile Phone: [phone] Relation: Son  Code Status:  DNR  Goals of care: Advanced Directive information Advanced Directives 04/06/2017  Does Patient Have a Medical Advance Directive? No  Type of Advance Directive -  Does patient want to make changes to medical advance directive? -  Copy of Healthcare Power of Attorney in Chart? -  Would patient like information on creating a medical advance directive? No - Patient declined  Pre-existing out of facility DNR order (yellow form or pink MOST form) -     Chief Complaint  Patient presents with  . Acute Visit    Hospital followup, status post admission at [facility] 1/2-08/18/17 for sepsis    HPI:  Pt is a 72 y.o. female seen today for followup, status post hospitalization at [facility] 1/2-08/18/17 for sepsis.  She was a long-term care resident at [facility] and was readmitted 08/18/17.  She has a PMH of renal insufficiency, dementia, hypertension, and psychosis. She was sent to the hospital for unresponsiveness. She was treated for sepsis with unclear source due to wbc 12.3 and fever of 102.7. She was initially treated with Vanco/zosyn She had respiratory PCR negative. She had 4 days of Zosyn. CT chest/abd/pelvis were negative. Dopplers were poor quality but raised concern for possible DVT. Palliative Care was consulted and planned to have  hospice care at [facility]. She was seen in the room today with husband at bedside. Husband requested for hospice consult.      Past Medical History:  Diagnosis Date  . Dementia    "alzheimer's" (05/18/2012)  . Fall 05/17/2012  . Fracture of medial wall of orbit (HCC) 05/17/2012  . Gout    "? feet" (05/18/2012); uric acid 9.4 on 08/06/16 with R wrist pain; 7.1 on 08/15/16 on Allopurinol  . Hypertension   . Incontinence of urine   . Stroke [facility]) ~ 2003   "slight memory loss" (05/18/2012)  . Wrist fracture, bilateral 05/17/2012   "fell down steps" (05/18/2012)   Past Surgical History:  Procedure Laterality Date  . fractured tooth     01/06/17 Dr Ocie Doyne DMD extracted tooth  #4   . VAGINAL HYSTERECTOMY      Allergies  Allergen Reactions  . Pollen Extract Other (See Comments)    rhinitis    Outpatient Encounter Medications as of 08/19/2017  Medication Sig  . acetaminophen (TYLENOL) 325 MG tablet Take 650 mg by mouth every 6 (six) hours as needed for moderate pain or fever.  Marland Kitchen allopurinol (ZYLOPRIM) 150 mg TABS tablet Take 150 mg by mouth daily.   . AMBULATORY NON FORMULARY MEDICATION Take 1 each by mouth 2 (two) times daily. Give 1 magic cup by mouth twice a day for supplement.   Marland Kitchen atorvastatin (LIPITOR) 10 MG tablet Take 10 mg by mouth daily.  Marland Kitchen CALCIUM-VITAMIN D PO Take 500 mg by mouth daily.  . cloNIDine (CATAPRES) 0.1 MG tablet Take 0.1-0.2 mg by mouth See admin instructions. Take 0.2 mg QAM, 0.1  mg QPM.  Hold for SBP <120  . Docusate Sodium (COLACE PO) Take 100 mg by mouth 2 (two) times daily.   Marland Kitchen labetalol (NORMODYNE) 300 MG tablet Take 300 mg by mouth 2 (two) times daily.  Marland Kitchen linaclotide (LINZESS) 72 MCG capsule Take 72 mcg by mouth every other day.  . Multiple Vitamins-Minerals (ELDERTONIC PO) Take 15 mLs by mouth 2 (two) times daily.  . polyethylene glycol (MIRALAX / GLYCOLAX) packet Take 17 g by mouth daily as needed.   . [DISCONTINUED] Wound Dressings (GRX HYDROCOLLOID DRESSING  EX) Apply 1 application topically as needed (for soilage).   No facility-administered encounter medications on file as of 08/19/2017.     Review of Systems Unable to obtain since patient is nonverbal/has dementia    Immunization History  Administered Date(s) Administered  . Influenza-Unspecified 05/10/2013, 05/22/2015, 05/14/2016, 05/23/2017  . Pneumococcal-Unspecified 06/03/2012, 05/14/2016   Pertinent  Health Maintenance Due  Topic Date Due  . PNA vac Low Risk Adult (2 of 2 - PCV13) 07/21/2018 (Originally 05/14/2017)  . MAMMOGRAM  03/09/2024 (Originally 11/26/2014)  . COLONOSCOPY  03/09/2024 (Originally 09/25/1993)  . INFLUENZA VACCINE  Completed  . DEXA SCAN  Discontinued   Fall Risk  04/06/2017 09/23/2016 09/11/2016 06/10/2016 04/10/2016  Falls in the past year? Yes No No No Exclusion - non ambulatory  Number falls in past yr: 1 - - - -  Injury with Fall? Yes - - - -      Vitals:   08/19/17 1116  BP: 99/70  Pulse: 67  Resp: 20  Temp: 97.8 F (36.6 C)  TempSrc: Oral  SpO2: 99%  Weight: 135 lb (61.2 kg)  Height: 5\' 1"  (1.549 m)   Body mass index is 25.51 kg/m.  Physical Exam  GENERAL APPEARANCE: Well nourished. In no acute distress. Normal body habitus MOUTH and THROAT: Lips are without lesions. Oral mucosa is moist and without lesions.  RESPIRATORY: Breathing is even & unlabored, BS CTAB CARDIAC: RRR, no murmur,no extra heart sounds, no edema GI: Abdomen soft, normal BS, no masses, no tenderness EXTREMITIES:  Does not move extremities NEURO:  Non-verbal, does not move extremities PSYCHIATRIC:  Affect and behavior are appropriate   Labs reviewed: Recent Labs    08/11/17 1349  08/13/17 0431 08/14/17 0214  08/16/17 0242 08/17/17 0309 08/18/17 0223  NA 156*   < > 150* 148*   < > 143 143 142  K 3.5   < > 3.1* 3.5   < > 3.2* 4.2 3.6  CL 128*   < > 123* 120*   < > 114* 114* 113*  CO2 20*   < > 18* 18*   < > 17* 14* 21*  GLUCOSE 97   < > 135* 115*   < > 123* 102*  120*  BUN 40*   < > 19 9   < > 15 17 20   CREATININE 1.95*   < > 1.51* 1.33*   < > 1.54* 1.48* 1.54*  CALCIUM 9.2   < > 8.5* 8.8*   < > 8.7* 8.7* 8.7*  MG 2.5*  --  2.0 1.8  --   --   --   --   PHOS 3.5  --   --   --   --   --   --   --    < > = values in this interval not displayed.   Recent Labs    08/11/17 0736 08/12/17 0249  AST 18 16  ALT 15 12*  ALKPHOS 77 71  BILITOT 0.6 1.0  PROT 8.3* 7.1  ALBUMIN 3.7 3.2*   Recent Labs    08/11/17 0736  08/14/17 2210  08/16/17 0242 08/17/17 0309 08/18/17 0223  WBC 12.3*   < > 11.0*   < > 15.9* 19.4* 16.0*  NEUTROABS 9.5*  --  8.5*  --   --   --   --   HGB 13.6   < > 14.8   < > 13.0 13.5 10.5*  HCT 44.3   < > 44.4   < > 40.0 39.4 32.9*  MCV 98.7   < > 92.1   < > 91.5 91.8 92.9  PLT 285   < > PLATELET CLUMPS NOTED ON SMEAR, COUNT APPEARS ADEQUATE   < > 326 296 341   < > = values in this interval not displayed.   Lab Results  Component Value Date   TSH 1.61 09/18/2016    Lab Results  Component Value Date   CHOL 126 12/17/2016   HDL 35 12/17/2016   LDLCALC 69 12/17/2016   TRIG 109 12/17/2016    Significant Diagnostic Results in last 30 days:  Dg Abd 1 View  Result Date: 08/15/2017 CLINICAL DATA:  Abdomen pain with vomiting. EXAM: ABDOMEN - 1 VIEW COMPARISON:  None. FINDINGS: The bowel gas pattern is normal. No radio-opaque calculi or other significant radiographic abnormality are seen. IMPRESSION: Negative. Electronically Signed   By: Elsie Stain M.D.   On: 08/15/2017 10:30   Ct Chest W Contrast  Result Date: 08/16/2017 CLINICAL DATA:  72 year old with dementia and admitted with sepsis and fever of unknown origin. 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 EXAM: CT CHEST, ABDOMEN, AND PELVIS WITH CONTRAST TECHNIQUE: Multidetector CT imaging of the chest, abdomen and pelvis was performed following the standard protocol during bolus administration of intravenous contrast. CONTRAST:  75mL ISOVUE-300 IOPAMIDOL  (ISOVUE-300) INJECTION 61% COMPARISON:  CT report from 08/25/2001 FINDINGS: CT CHEST FINDINGS Cardiovascular: There is a large complex aneurysm involving the ascending thoracic aorta. This appears to represent a large pseudo aneurysm arising near the sinotubular junction. This complex aneurysm has a maximum size of 6.5 cm in the craniocaudal dimension measures 4.7 cm in transverse dimension. Aneurysm contains a large amount of thrombus and some calcifications. Prior median sternotomy and the ascending thoracic aorta has likely been replaced with a surgical anastomosis on sequence 3, image 20 near the proximal aortic arch. The ascending thoracic aorta which probably represents a graft appears to be diffusely enlarged measuring up to 4.7 cm. No evidence for acute hemorrhage or active contrast extravasation. Aortic dissection at the aortic arch that extends into the descending thoracic aorta. Dissection flap involves the left common carotid artery. Limited evaluation of the great vessels on this non CT examination. Dissection also appears to involve the right common carotid artery. Proximal descending thoracic aorta measures 4.3 cm. There appears to be high-density contrast within the false lumen. Coronary arteries are heavily calcified. Mediastinum/Nodes: Small hiatal hernia and suspect wall thickening in the distal esophagus which is nonspecific. Lungs/Pleura: Trachea and mainstem bronchi are patent. Volume loss and atelectasis in the lower lobes. No large pleural effusions. Few subtle peripheral densities in left upper lobe on sequence 4, image 61 and 60 are nonspecific. Otherwise, no significant airspace disease or consolidation. Musculoskeletal: Trachea and mainstem bronchi are patent. CT ABDOMEN PELVIS FINDINGS Hepatobiliary: Normal appearance of the liver, gallbladder and portal venous system. Pancreas: Normal appearance of the pancreas without inflammation  or duct dilatation. Spleen: Normal appearance of spleen  without enlargement. Adrenals/Urinary Tract: Normal adrenal glands. Left renal cyst. Negative for hydronephrosis. No suspicious renal lesions. Urinary bladder is unremarkable. Stomach/Bowel: Hiatal hernia. A massive amount stool in the transverse colon. No evidence for bowel obstruction or focal bowel inflammation. Vascular/Lymphatic: Aortic dissection extends into the abdominal aorta. The true lumen is heavily calcified. The celiac trunk and SMA appear to originate from the true lumen. Right renal artery probably originates from the false lumen. The true lumen is heavily calcified distally with stenosis. Difficult to evaluate the dissection in the common iliac arteries. The iliac arteries are heavily calcified. No significant lymph node enlargement in the abdomen or pelvis. Reproductive: Status post hysterectomy. No adnexal masses. Other: No free fluid.  Negative for free air. Musculoskeletal: Large Schmorl's node involving the superior endplate of T12. No suspicious bone findings. 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 This complex aneurysm contains a large amount of thrombus and measures up to 6.5 cm. No evidence for rupture or active bleeding. 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 Limited evaluation of this dissection on this non CTA examination. No CT evidence to clearly explain fever of unknown origin. Volume loss in lungs with a few peripheral densities in left upper lobe that are nonspecific. Large amount of stool in the transverse colon. These results were called by telephone at the time of interpretation on 08/16/2017 at 3:09 pm to Dr. Noralee StainJENNIFER CHOI , who verbally acknowledged these results. Electronically Signed   By: Richarda OverlieAdam  Henn M.D.   On: 08/16/2017 15:10   Ct Abdomen Pelvis W Contrast  Result Date: 08/16/2017 CLINICAL DATA:  72 year old with dementia and  admitted with sepsis and fever of unknown origin. 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 EXAM: CT CHEST, ABDOMEN, AND PELVIS WITH CONTRAST TECHNIQUE: Multidetector CT imaging of the chest, abdomen and pelvis was performed following the standard protocol during bolus administration of intravenous contrast. 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 This appears to represent a large pseudo aneurysm arising near the sinotubular junction. This complex aneurysm has a maximum size of 6.5 cm in the craniocaudal dimension measures 4.7 cm in transverse dimension. Aneurysm contains a large amount of thrombus and some calcifications. Prior median sternotomy and the ascending thoracic aorta has likely been replaced with a surgical anastomosis on sequence 3, image 20 near the proximal aortic arch. The ascending thoracic aorta which probably represents a graft appears to be diffusely enlarged measuring up to 4.7 cm. No evidence for acute hemorrhage or active contrast extravasation. Aortic dissection at the aortic arch that extends into the descending thoracic aorta. Dissection flap involves the left common carotid artery. Limited evaluation of the great vessels on this non CT examination. Dissection also appears to involve the right common carotid artery. Proximal descending thoracic aorta measures 4.3 cm. There appears to be high-density contrast within the false lumen. Coronary arteries are heavily calcified. Mediastinum/Nodes: Small hiatal hernia and suspect wall thickening in the distal esophagus which is nonspecific. Lungs/Pleura: Trachea and mainstem bronchi are patent. Volume loss and atelectasis in the lower lobes. No large pleural effusions. Few subtle peripheral densities in left upper lobe on sequence 4, image 61 and 60 are  nonspecific. Otherwise, no significant airspace disease or consolidation.  Musculoskeletal: Trachea and mainstem bronchi are patent. CT ABDOMEN PELVIS FINDINGS Hepatobiliary: Normal appearance of the liver, gallbladder and portal venous system. Pancreas: Normal appearance of the pancreas without inflammation or duct dilatation. Spleen: Normal appearance of spleen without enlargement. Adrenals/Urinary Tract: Normal adrenal glands. Left renal cyst. Negative for hydronephrosis. No suspicious renal lesions. Urinary bladder is unremarkable. Stomach/Bowel: Hiatal hernia. A massive amount stool in the transverse colon. No evidence for bowel obstruction or focal bowel inflammation. Vascular/Lymphatic: Aortic dissection extends into the abdominal aorta. The true lumen is heavily calcified. The celiac trunk and SMA appear to originate from the true lumen. Right renal artery probably originates from the false lumen. The true lumen is heavily calcified distally with stenosis. Difficult to evaluate the dissection in the common iliac arteries. The iliac arteries are heavily calcified. No significant lymph node enlargement in the abdomen or pelvis. Reproductive: Status post hysterectomy. No adnexal masses. Other: No free fluid.  Negative for free air. Musculoskeletal: Large Schmorl's node involving the superior endplate of T12. No suspicious bone findings. 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 This complex aneurysm contains a large amount of thrombus and measures up to 6.5 cm. No evidence for rupture or active bleeding. 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 Limited evaluation of this dissection on this non CTA examination. No CT evidence to clearly explain fever of unknown origin. Volume loss in lungs with a few peripheral densities in left upper lobe that are nonspecific. Large amount of  stool in the transverse colon. These results were called by telephone at the time of interpretation on 08/16/2017 at 3:09 pm to Dr. Noralee Stain , who verbally acknowledged these results. Electronically Signed   By: Richarda Overlie M.D.   On: 08/16/2017 15:10   Dg Chest Port 1 View  Result Date: 08/15/2017 CLINICAL DATA:  Abdomen pain with vomiting.  Shortness of breath. EXAM: PORTABLE CHEST 1 VIEW COMPARISON:  08/13/2017. FINDINGS: Prior median sternotomy for CABG. Cardiomegaly. Thoracic atherosclerosis. No consolidation or edema. Similar appearance to priors. IMPRESSION: Stable chest.  No active disease. Electronically Signed   By: Elsie Stain M.D.   On: 08/15/2017 10:31   Dg Chest Port 1 View  Result Date: 08/13/2017 CLINICAL DATA:  Shortness of breath. EXAM: PORTABLE CHEST 1 VIEW COMPARISON:  08/11/2017. FINDINGS: Prior CABG. Cardiomegaly with normal pulmonary vascularity. No focal infiltrate. No pleural effusion or pneumothorax. No acute bony abnormality . IMPRESSION: 1. Prior CABG.  Cardiomegaly.  No pulmonary venous congestion. 2. No acute pulmonary disease. Electronically Signed   By: Maisie Fus  Register   On: 08/13/2017 09:09   Dg Chest Portable 1 View  Result Date: 08/11/2017 CLINICAL DATA:  Sepsis EXAM: PORTABLE CHEST 1 VIEW COMPARISON:  05/17/2012 FINDINGS: Cardiac shadow is enlarged. Postsurgical changes are again noted. Tortuous vascularity is seen and stable. The lungs are clear bilaterally. No bony abnormality is seen. IMPRESSION: No acute abnormality noted. Electronically Signed   By: Alcide Clever M.D.   On: 08/11/2017 08:13    Assessment/Plan  1. Sepsis, due to unspecified organism [facility]) - was started on Vanco/Zosyn, blood cultures were negative, received 4 days of Zosyn, no bacterial etiology of infection so antibiotics were discontinued, palliative care was consulted and decided to have hospice care/comfort care   2. Pseudoaneurysm of aorta (HCC) - no evidence of rupture or active  bleeding, patient is a poor candidate for any surgical procedures due to being nonverbal, nonambulatory  and contractures at her extremities, will have hospice consult with [facility]   3. Essential hypertension, benign - well-controlled, continue Labetalol 300 mg 1 tab BID, Clonidine 0.2 mg 1 tab Q AM, and Clonidine 0.1 mg 1 tab Q evening   4. Dysphagia, unspecified type - continue Puree with honey thick liquids, aspiration precautions   5. Alzheimer's dementia without behavioral disturbance, unspecified timing of dementia onset   6. Chronic constipation - continue Linzess 72 mcg 1 capsule Q other day and Miralax 17 gm daily PRN   7. History of gout - continue Allopurinol 100 mg give 1 1/2 tab = 150 mg daily   8. Mixed hyperlipidemia - continue Lipitor 10 mg 1 tab daily     Family/ staff Communication: Discussed plan of care with husband and charge nurse.  Labs/tests ordered:  None  Goals of care:   Long-term care, Hospice care   Kenard Gower, NP [facility] [phone] (Monday-Friday 8:00 a.m. - 5:00 p.m.) [phone] (after hours)

## 2017-08-19 NOTE — Telephone Encounter (Signed)
Possible re-admission to facility. [facility] is needed if patient was re-admitted to facility upon discharge. [facility]

## 2017-08-20 DIAGNOSIS — M2450 Contracture, unspecified joint: Secondary | ICD-10-CM | POA: Diagnosis not present

## 2017-08-20 DIAGNOSIS — I10 Essential (primary) hypertension: Secondary | ICD-10-CM | POA: Diagnosis not present

## 2017-08-20 DIAGNOSIS — N183 Chronic kidney disease, stage 3 (moderate): Secondary | ICD-10-CM | POA: Diagnosis not present

## 2017-08-20 DIAGNOSIS — L8990 Pressure ulcer of unspecified site, unspecified stage: Secondary | ICD-10-CM | POA: Diagnosis not present

## 2017-08-20 DIAGNOSIS — I679 Cerebrovascular disease, unspecified: Secondary | ICD-10-CM | POA: Diagnosis not present

## 2017-08-20 DIAGNOSIS — G309 Alzheimer's disease, unspecified: Secondary | ICD-10-CM | POA: Diagnosis not present

## 2017-08-20 DIAGNOSIS — M109 Gout, unspecified: Secondary | ICD-10-CM | POA: Diagnosis not present

## 2017-08-20 DIAGNOSIS — J309 Allergic rhinitis, unspecified: Secondary | ICD-10-CM | POA: Diagnosis not present

## 2017-08-20 DIAGNOSIS — E785 Hyperlipidemia, unspecified: Secondary | ICD-10-CM | POA: Diagnosis not present

## 2017-08-20 DIAGNOSIS — R1310 Dysphagia, unspecified: Secondary | ICD-10-CM | POA: Diagnosis not present

## 2017-08-21 DIAGNOSIS — G309 Alzheimer's disease, unspecified: Secondary | ICD-10-CM | POA: Diagnosis not present

## 2017-08-21 DIAGNOSIS — E785 Hyperlipidemia, unspecified: Secondary | ICD-10-CM | POA: Diagnosis not present

## 2017-08-21 DIAGNOSIS — I679 Cerebrovascular disease, unspecified: Secondary | ICD-10-CM | POA: Diagnosis not present

## 2017-08-21 DIAGNOSIS — I10 Essential (primary) hypertension: Secondary | ICD-10-CM | POA: Diagnosis not present

## 2017-08-21 DIAGNOSIS — M109 Gout, unspecified: Secondary | ICD-10-CM | POA: Diagnosis not present

## 2017-08-21 DIAGNOSIS — J309 Allergic rhinitis, unspecified: Secondary | ICD-10-CM | POA: Diagnosis not present

## 2017-08-21 DIAGNOSIS — M2450 Contracture, unspecified joint: Secondary | ICD-10-CM | POA: Diagnosis not present

## 2017-08-21 DIAGNOSIS — N183 Chronic kidney disease, stage 3 (moderate): Secondary | ICD-10-CM | POA: Diagnosis not present

## 2017-08-21 DIAGNOSIS — R1310 Dysphagia, unspecified: Secondary | ICD-10-CM | POA: Diagnosis not present

## 2017-08-21 DIAGNOSIS — L8990 Pressure ulcer of unspecified site, unspecified stage: Secondary | ICD-10-CM | POA: Diagnosis not present

## 2017-08-21 LAB — CULTURE, BLOOD (ROUTINE X 2)
CULTURE: NO GROWTH
Culture: NO GROWTH
SPECIAL REQUESTS: ADEQUATE
Special Requests: ADEQUATE

## 2017-08-22 DIAGNOSIS — I679 Cerebrovascular disease, unspecified: Secondary | ICD-10-CM | POA: Diagnosis not present

## 2017-08-22 DIAGNOSIS — L8990 Pressure ulcer of unspecified site, unspecified stage: Secondary | ICD-10-CM | POA: Diagnosis not present

## 2017-08-22 DIAGNOSIS — N183 Chronic kidney disease, stage 3 (moderate): Secondary | ICD-10-CM | POA: Diagnosis not present

## 2017-08-22 DIAGNOSIS — J309 Allergic rhinitis, unspecified: Secondary | ICD-10-CM | POA: Diagnosis not present

## 2017-08-22 DIAGNOSIS — R1310 Dysphagia, unspecified: Secondary | ICD-10-CM | POA: Diagnosis not present

## 2017-08-22 DIAGNOSIS — M2450 Contracture, unspecified joint: Secondary | ICD-10-CM | POA: Diagnosis not present

## 2017-08-22 DIAGNOSIS — G309 Alzheimer's disease, unspecified: Secondary | ICD-10-CM | POA: Diagnosis not present

## 2017-08-22 DIAGNOSIS — M109 Gout, unspecified: Secondary | ICD-10-CM | POA: Diagnosis not present

## 2017-08-22 DIAGNOSIS — I10 Essential (primary) hypertension: Secondary | ICD-10-CM | POA: Diagnosis not present

## 2017-08-22 DIAGNOSIS — E785 Hyperlipidemia, unspecified: Secondary | ICD-10-CM | POA: Diagnosis not present

## 2017-08-23 DIAGNOSIS — N183 Chronic kidney disease, stage 3 (moderate): Secondary | ICD-10-CM | POA: Diagnosis not present

## 2017-08-23 DIAGNOSIS — I10 Essential (primary) hypertension: Secondary | ICD-10-CM | POA: Diagnosis not present

## 2017-08-23 DIAGNOSIS — L8990 Pressure ulcer of unspecified site, unspecified stage: Secondary | ICD-10-CM | POA: Diagnosis not present

## 2017-08-23 DIAGNOSIS — M109 Gout, unspecified: Secondary | ICD-10-CM | POA: Diagnosis not present

## 2017-08-23 DIAGNOSIS — I679 Cerebrovascular disease, unspecified: Secondary | ICD-10-CM | POA: Diagnosis not present

## 2017-08-23 DIAGNOSIS — E785 Hyperlipidemia, unspecified: Secondary | ICD-10-CM | POA: Diagnosis not present

## 2017-08-23 DIAGNOSIS — G309 Alzheimer's disease, unspecified: Secondary | ICD-10-CM | POA: Diagnosis not present

## 2017-08-23 DIAGNOSIS — J309 Allergic rhinitis, unspecified: Secondary | ICD-10-CM | POA: Diagnosis not present

## 2017-08-23 DIAGNOSIS — R1310 Dysphagia, unspecified: Secondary | ICD-10-CM | POA: Diagnosis not present

## 2017-08-23 DIAGNOSIS — M2450 Contracture, unspecified joint: Secondary | ICD-10-CM | POA: Diagnosis not present

## 2017-08-24 ENCOUNTER — Encounter: Payer: Self-pay | Admitting: Internal Medicine

## 2017-08-24 ENCOUNTER — Non-Acute Institutional Stay (SKILLED_NURSING_FACILITY): Payer: Medicare Other | Admitting: Internal Medicine

## 2017-08-24 DIAGNOSIS — J690 Pneumonitis due to inhalation of food and vomit: Secondary | ICD-10-CM

## 2017-08-24 DIAGNOSIS — M2450 Contracture, unspecified joint: Secondary | ICD-10-CM | POA: Diagnosis not present

## 2017-08-24 DIAGNOSIS — M109 Gout, unspecified: Secondary | ICD-10-CM | POA: Diagnosis not present

## 2017-08-24 DIAGNOSIS — J309 Allergic rhinitis, unspecified: Secondary | ICD-10-CM | POA: Diagnosis not present

## 2017-08-24 DIAGNOSIS — G309 Alzheimer's disease, unspecified: Secondary | ICD-10-CM | POA: Diagnosis not present

## 2017-08-24 DIAGNOSIS — I679 Cerebrovascular disease, unspecified: Secondary | ICD-10-CM | POA: Diagnosis not present

## 2017-08-24 DIAGNOSIS — I10 Essential (primary) hypertension: Secondary | ICD-10-CM | POA: Diagnosis not present

## 2017-08-24 DIAGNOSIS — L8990 Pressure ulcer of unspecified site, unspecified stage: Secondary | ICD-10-CM | POA: Diagnosis not present

## 2017-08-24 DIAGNOSIS — N183 Chronic kidney disease, stage 3 (moderate): Secondary | ICD-10-CM | POA: Diagnosis not present

## 2017-08-24 DIAGNOSIS — Z515 Encounter for palliative care: Secondary | ICD-10-CM | POA: Diagnosis not present

## 2017-08-24 DIAGNOSIS — E785 Hyperlipidemia, unspecified: Secondary | ICD-10-CM | POA: Diagnosis not present

## 2017-08-24 DIAGNOSIS — R1310 Dysphagia, unspecified: Secondary | ICD-10-CM | POA: Diagnosis not present

## 2017-08-24 NOTE — Progress Notes (Signed)
This is a [facility] to [facility] performed on this date less than 30 days from that date.  HPI: Patient was hospitalized 1/2-08/18/17 with decreased level of consciousness. The patient had a fever &  had not been eating much over the past week prior to admission. She was admitted with probable sepsis due to suspected aspiration pneumonia, dehydration, hypernatremia. At presentation patient was tachycardic, tachypneic, and febrile with temperature 102.7. White count was 12,300. Initial x-ray was unremarkable but the patient was clinically dehydrated. Empirically vancomycin/Zosyn were initiated. Respiratory PCR was negative as were blood cultures. She received Zosyn for 4 days, this was discontinued as no bacterial etiology was documented. After discontinuation of Zosyn she had low-grade fevers up to 100.5 with tachycardia and leukocytosis. Procalcitonin rose from 0.38 up to 0.53. CT of the chest revealed no source of infection. Repeat blood cultures were again negative. Possible DVT was suspected on Doppler. Palliative care was consulted .As the patient has multiple comorbidities in the context of advanced dementia with nonverbal, bedbound status. Hospice comfort care was recommended at the SNF. An incidental finding was a large pseudoaneurysm in the ascending thoracic aorta without rupture or bleed. She had a history of ascending aortic dissection in 2003. The patient is a nonsurgical candidate for intervention. She did have an episode of vomiting 1/5. Imaging revealed no definite aspiration. Dysphagia 1 diet with thickened liquids was resumed because of the high risk of aspiration. Hypernatremia was addressed with D5 half-normal saline with resolution. Acute kidney insult on chronic kidney disease stage III was present. Baseline creatinine was 1.5. There was improvement with IV fluids. Pressure ulcer of the coccyx stage II was present prior to admission. Wound care was  continued  Past medical and surgical history: The most important history is that of stroke with upper extremity contractions & dysphagia and non verbal, nonambulatory state  Social history: Reviewed  Family history: Noncontributory  Review of systems:Could not be completed due to dementia and nonverbal state.   On 08/24/17 rhonchi were reported throughout the chest with intermittent cough by nursing staff. Clinical aspiration was felt to be present by the nursing staff. Despite having agreed to hospice her husband now is requesting IV fluids and antibiotics. He apparently had agreed to discontinue allopurinol, Lipitor, and Linzess. He states that he stops feeding her if she begins to choke.  Physical exam:  Pertinent or positive findings: Patchy alopecia is present. She is resistant to oral and ophthalmologic exams. When the eyelids are retracted Arcus senilis is present. She has residual food in her mouth and teeth are coated. Both first and second heart sounds are accentuated, particularly the second heart sound. She has rhonchi over the upper airway but not over the thorax. The chest itself is surprisingly clear. She has trace edema at the sock line. Dorsalis pedis pulses are decreased greater than posterior tibial pulses. Her arms are held flexed across the chest and exhibit increased tone when range of motion is attempted.  General appearance:Adequately nourished; no acute distress , increased work of breathing is present.   Lymphatic: No lymphadenopathy about the head, neck, axilla . Eyes: No conjunctival inflammation or lid edema is present. There is no scleral icterus. Ears:  External ear exam shows no significant lesions or deformities.   Nose:  External nasal examination shows no deformity or inflammation. Nasal mucosa are pink and moist without lesions ,exudates Oral exam: lips and gums are healthy appearing.There is no oropharyngeal erythema or exudate . Neck:  No thyromegaly, masses,  tenderness noted.    Heart:  Normal rate and regular rhythm without gallop, murmur, click, rub .  Lungs:without wheezes, rales , rubs. Abdomen:Bowel sounds are normal. Abdomen is soft and nontender with no organomegaly, hernias,masses. GU: deferred  Extremities:  No cyanosis, clubbing Skin: Warm & dry w/o tenting. No significant lesions or rash.  See clinical summary under each active problem in the Problem List with associated updated therapeutic plan

## 2017-08-24 NOTE — Assessment & Plan Note (Deleted)
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

## 2017-08-24 NOTE — Assessment & Plan Note (Signed)
Hospice will be requested to address excess oral secretions and morphine for respiratory disstress as needed

## 2017-08-25 DIAGNOSIS — J309 Allergic rhinitis, unspecified: Secondary | ICD-10-CM | POA: Diagnosis not present

## 2017-08-25 DIAGNOSIS — M2450 Contracture, unspecified joint: Secondary | ICD-10-CM | POA: Diagnosis not present

## 2017-08-25 DIAGNOSIS — N183 Chronic kidney disease, stage 3 (moderate): Secondary | ICD-10-CM | POA: Diagnosis not present

## 2017-08-25 DIAGNOSIS — I10 Essential (primary) hypertension: Secondary | ICD-10-CM | POA: Diagnosis not present

## 2017-08-25 DIAGNOSIS — G309 Alzheimer's disease, unspecified: Secondary | ICD-10-CM | POA: Diagnosis not present

## 2017-08-25 DIAGNOSIS — E785 Hyperlipidemia, unspecified: Secondary | ICD-10-CM | POA: Diagnosis not present

## 2017-08-25 DIAGNOSIS — M109 Gout, unspecified: Secondary | ICD-10-CM | POA: Diagnosis not present

## 2017-08-25 DIAGNOSIS — I679 Cerebrovascular disease, unspecified: Secondary | ICD-10-CM | POA: Diagnosis not present

## 2017-08-25 DIAGNOSIS — R1310 Dysphagia, unspecified: Secondary | ICD-10-CM | POA: Diagnosis not present

## 2017-08-25 DIAGNOSIS — L8990 Pressure ulcer of unspecified site, unspecified stage: Secondary | ICD-10-CM | POA: Diagnosis not present

## 2017-08-25 NOTE — Assessment & Plan Note (Signed)
Hospital course & findings and pathophsiology of aspiration discussed with husband & options discussed

## 2017-08-25 NOTE — Patient Instructions (Addendum)
See assessment and plan under each diagnosis in the problem list and acutely for this visit Total time 29 minutes; greater than 50% of the visit spent counseling patient's husband and coordinating care for problems addressed at this encounter

## 2017-08-26 DIAGNOSIS — J309 Allergic rhinitis, unspecified: Secondary | ICD-10-CM | POA: Diagnosis not present

## 2017-08-26 DIAGNOSIS — M2450 Contracture, unspecified joint: Secondary | ICD-10-CM | POA: Diagnosis not present

## 2017-08-26 DIAGNOSIS — L8990 Pressure ulcer of unspecified site, unspecified stage: Secondary | ICD-10-CM | POA: Diagnosis not present

## 2017-08-26 DIAGNOSIS — M109 Gout, unspecified: Secondary | ICD-10-CM | POA: Diagnosis not present

## 2017-08-26 DIAGNOSIS — R1310 Dysphagia, unspecified: Secondary | ICD-10-CM | POA: Diagnosis not present

## 2017-08-26 DIAGNOSIS — I10 Essential (primary) hypertension: Secondary | ICD-10-CM | POA: Diagnosis not present

## 2017-08-26 DIAGNOSIS — E785 Hyperlipidemia, unspecified: Secondary | ICD-10-CM | POA: Diagnosis not present

## 2017-08-26 DIAGNOSIS — G309 Alzheimer's disease, unspecified: Secondary | ICD-10-CM | POA: Diagnosis not present

## 2017-08-26 DIAGNOSIS — I679 Cerebrovascular disease, unspecified: Secondary | ICD-10-CM | POA: Diagnosis not present

## 2017-08-26 DIAGNOSIS — N183 Chronic kidney disease, stage 3 (moderate): Secondary | ICD-10-CM | POA: Diagnosis not present

## 2017-08-27 DIAGNOSIS — G309 Alzheimer's disease, unspecified: Secondary | ICD-10-CM | POA: Diagnosis not present

## 2017-08-27 DIAGNOSIS — E785 Hyperlipidemia, unspecified: Secondary | ICD-10-CM | POA: Diagnosis not present

## 2017-08-27 DIAGNOSIS — L8990 Pressure ulcer of unspecified site, unspecified stage: Secondary | ICD-10-CM | POA: Diagnosis not present

## 2017-08-27 DIAGNOSIS — I679 Cerebrovascular disease, unspecified: Secondary | ICD-10-CM | POA: Diagnosis not present

## 2017-08-27 DIAGNOSIS — R1310 Dysphagia, unspecified: Secondary | ICD-10-CM | POA: Diagnosis not present

## 2017-08-27 DIAGNOSIS — J309 Allergic rhinitis, unspecified: Secondary | ICD-10-CM | POA: Diagnosis not present

## 2017-08-27 DIAGNOSIS — N183 Chronic kidney disease, stage 3 (moderate): Secondary | ICD-10-CM | POA: Diagnosis not present

## 2017-08-27 DIAGNOSIS — M109 Gout, unspecified: Secondary | ICD-10-CM | POA: Diagnosis not present

## 2017-08-27 DIAGNOSIS — M2450 Contracture, unspecified joint: Secondary | ICD-10-CM | POA: Diagnosis not present

## 2017-08-27 DIAGNOSIS — I10 Essential (primary) hypertension: Secondary | ICD-10-CM | POA: Diagnosis not present

## 2017-08-28 DIAGNOSIS — E785 Hyperlipidemia, unspecified: Secondary | ICD-10-CM | POA: Diagnosis not present

## 2017-08-28 DIAGNOSIS — N183 Chronic kidney disease, stage 3 (moderate): Secondary | ICD-10-CM | POA: Diagnosis not present

## 2017-08-28 DIAGNOSIS — I10 Essential (primary) hypertension: Secondary | ICD-10-CM | POA: Diagnosis not present

## 2017-08-28 DIAGNOSIS — R1310 Dysphagia, unspecified: Secondary | ICD-10-CM | POA: Diagnosis not present

## 2017-08-28 DIAGNOSIS — M2450 Contracture, unspecified joint: Secondary | ICD-10-CM | POA: Diagnosis not present

## 2017-08-28 DIAGNOSIS — L8990 Pressure ulcer of unspecified site, unspecified stage: Secondary | ICD-10-CM | POA: Diagnosis not present

## 2017-08-28 DIAGNOSIS — G309 Alzheimer's disease, unspecified: Secondary | ICD-10-CM | POA: Diagnosis not present

## 2017-08-28 DIAGNOSIS — I679 Cerebrovascular disease, unspecified: Secondary | ICD-10-CM | POA: Diagnosis not present

## 2017-08-28 DIAGNOSIS — M109 Gout, unspecified: Secondary | ICD-10-CM | POA: Diagnosis not present

## 2017-08-28 DIAGNOSIS — J309 Allergic rhinitis, unspecified: Secondary | ICD-10-CM | POA: Diagnosis not present

## 2017-08-29 DIAGNOSIS — M109 Gout, unspecified: Secondary | ICD-10-CM | POA: Diagnosis not present

## 2017-08-29 DIAGNOSIS — I679 Cerebrovascular disease, unspecified: Secondary | ICD-10-CM | POA: Diagnosis not present

## 2017-08-29 DIAGNOSIS — E785 Hyperlipidemia, unspecified: Secondary | ICD-10-CM | POA: Diagnosis not present

## 2017-08-29 DIAGNOSIS — J309 Allergic rhinitis, unspecified: Secondary | ICD-10-CM | POA: Diagnosis not present

## 2017-08-29 DIAGNOSIS — G309 Alzheimer's disease, unspecified: Secondary | ICD-10-CM | POA: Diagnosis not present

## 2017-08-29 DIAGNOSIS — N183 Chronic kidney disease, stage 3 (moderate): Secondary | ICD-10-CM | POA: Diagnosis not present

## 2017-08-29 DIAGNOSIS — M2450 Contracture, unspecified joint: Secondary | ICD-10-CM | POA: Diagnosis not present

## 2017-08-29 DIAGNOSIS — R1310 Dysphagia, unspecified: Secondary | ICD-10-CM | POA: Diagnosis not present

## 2017-08-29 DIAGNOSIS — L8990 Pressure ulcer of unspecified site, unspecified stage: Secondary | ICD-10-CM | POA: Diagnosis not present

## 2017-08-29 DIAGNOSIS — I10 Essential (primary) hypertension: Secondary | ICD-10-CM | POA: Diagnosis not present

## 2017-08-30 DIAGNOSIS — J309 Allergic rhinitis, unspecified: Secondary | ICD-10-CM | POA: Diagnosis not present

## 2017-08-30 DIAGNOSIS — I679 Cerebrovascular disease, unspecified: Secondary | ICD-10-CM | POA: Diagnosis not present

## 2017-08-30 DIAGNOSIS — E785 Hyperlipidemia, unspecified: Secondary | ICD-10-CM | POA: Diagnosis not present

## 2017-08-30 DIAGNOSIS — G309 Alzheimer's disease, unspecified: Secondary | ICD-10-CM | POA: Diagnosis not present

## 2017-08-30 DIAGNOSIS — L89152 Pressure ulcer of sacral region, stage 2: Secondary | ICD-10-CM | POA: Diagnosis not present

## 2017-08-30 DIAGNOSIS — N183 Chronic kidney disease, stage 3 (moderate): Secondary | ICD-10-CM | POA: Diagnosis not present

## 2017-08-30 DIAGNOSIS — R1310 Dysphagia, unspecified: Secondary | ICD-10-CM | POA: Diagnosis not present

## 2017-08-30 DIAGNOSIS — I10 Essential (primary) hypertension: Secondary | ICD-10-CM | POA: Diagnosis not present

## 2017-08-30 DIAGNOSIS — L8990 Pressure ulcer of unspecified site, unspecified stage: Secondary | ICD-10-CM | POA: Diagnosis not present

## 2017-08-30 DIAGNOSIS — M2450 Contracture, unspecified joint: Secondary | ICD-10-CM | POA: Diagnosis not present

## 2017-08-30 DIAGNOSIS — M109 Gout, unspecified: Secondary | ICD-10-CM | POA: Diagnosis not present

## 2017-08-31 DIAGNOSIS — N183 Chronic kidney disease, stage 3 (moderate): Secondary | ICD-10-CM | POA: Diagnosis not present

## 2017-08-31 DIAGNOSIS — J309 Allergic rhinitis, unspecified: Secondary | ICD-10-CM | POA: Diagnosis not present

## 2017-08-31 DIAGNOSIS — I10 Essential (primary) hypertension: Secondary | ICD-10-CM | POA: Diagnosis not present

## 2017-08-31 DIAGNOSIS — E785 Hyperlipidemia, unspecified: Secondary | ICD-10-CM | POA: Diagnosis not present

## 2017-08-31 DIAGNOSIS — R1310 Dysphagia, unspecified: Secondary | ICD-10-CM | POA: Diagnosis not present

## 2017-08-31 DIAGNOSIS — L8990 Pressure ulcer of unspecified site, unspecified stage: Secondary | ICD-10-CM | POA: Diagnosis not present

## 2017-08-31 DIAGNOSIS — M2450 Contracture, unspecified joint: Secondary | ICD-10-CM | POA: Diagnosis not present

## 2017-08-31 DIAGNOSIS — M109 Gout, unspecified: Secondary | ICD-10-CM | POA: Diagnosis not present

## 2017-08-31 DIAGNOSIS — G309 Alzheimer's disease, unspecified: Secondary | ICD-10-CM | POA: Diagnosis not present

## 2017-08-31 DIAGNOSIS — I679 Cerebrovascular disease, unspecified: Secondary | ICD-10-CM | POA: Diagnosis not present

## 2017-09-01 DIAGNOSIS — E785 Hyperlipidemia, unspecified: Secondary | ICD-10-CM | POA: Diagnosis not present

## 2017-09-01 DIAGNOSIS — N183 Chronic kidney disease, stage 3 (moderate): Secondary | ICD-10-CM | POA: Diagnosis not present

## 2017-09-01 DIAGNOSIS — M2450 Contracture, unspecified joint: Secondary | ICD-10-CM | POA: Diagnosis not present

## 2017-09-01 DIAGNOSIS — R1310 Dysphagia, unspecified: Secondary | ICD-10-CM | POA: Diagnosis not present

## 2017-09-01 DIAGNOSIS — I679 Cerebrovascular disease, unspecified: Secondary | ICD-10-CM | POA: Diagnosis not present

## 2017-09-01 DIAGNOSIS — J309 Allergic rhinitis, unspecified: Secondary | ICD-10-CM | POA: Diagnosis not present

## 2017-09-01 DIAGNOSIS — L8990 Pressure ulcer of unspecified site, unspecified stage: Secondary | ICD-10-CM | POA: Diagnosis not present

## 2017-09-01 DIAGNOSIS — I10 Essential (primary) hypertension: Secondary | ICD-10-CM | POA: Diagnosis not present

## 2017-09-01 DIAGNOSIS — G309 Alzheimer's disease, unspecified: Secondary | ICD-10-CM | POA: Diagnosis not present

## 2017-09-01 DIAGNOSIS — M109 Gout, unspecified: Secondary | ICD-10-CM | POA: Diagnosis not present

## 2017-09-02 DIAGNOSIS — J309 Allergic rhinitis, unspecified: Secondary | ICD-10-CM | POA: Diagnosis not present

## 2017-09-02 DIAGNOSIS — I679 Cerebrovascular disease, unspecified: Secondary | ICD-10-CM | POA: Diagnosis not present

## 2017-09-02 DIAGNOSIS — N183 Chronic kidney disease, stage 3 (moderate): Secondary | ICD-10-CM | POA: Diagnosis not present

## 2017-09-02 DIAGNOSIS — E785 Hyperlipidemia, unspecified: Secondary | ICD-10-CM | POA: Diagnosis not present

## 2017-09-02 DIAGNOSIS — L8990 Pressure ulcer of unspecified site, unspecified stage: Secondary | ICD-10-CM | POA: Diagnosis not present

## 2017-09-02 DIAGNOSIS — R1310 Dysphagia, unspecified: Secondary | ICD-10-CM | POA: Diagnosis not present

## 2017-09-02 DIAGNOSIS — M2450 Contracture, unspecified joint: Secondary | ICD-10-CM | POA: Diagnosis not present

## 2017-09-02 DIAGNOSIS — M109 Gout, unspecified: Secondary | ICD-10-CM | POA: Diagnosis not present

## 2017-09-02 DIAGNOSIS — G309 Alzheimer's disease, unspecified: Secondary | ICD-10-CM | POA: Diagnosis not present

## 2017-09-02 DIAGNOSIS — I10 Essential (primary) hypertension: Secondary | ICD-10-CM | POA: Diagnosis not present

## 2017-09-03 DIAGNOSIS — N183 Chronic kidney disease, stage 3 (moderate): Secondary | ICD-10-CM | POA: Diagnosis not present

## 2017-09-03 DIAGNOSIS — I679 Cerebrovascular disease, unspecified: Secondary | ICD-10-CM | POA: Diagnosis not present

## 2017-09-03 DIAGNOSIS — M2450 Contracture, unspecified joint: Secondary | ICD-10-CM | POA: Diagnosis not present

## 2017-09-03 DIAGNOSIS — J309 Allergic rhinitis, unspecified: Secondary | ICD-10-CM | POA: Diagnosis not present

## 2017-09-03 DIAGNOSIS — I10 Essential (primary) hypertension: Secondary | ICD-10-CM | POA: Diagnosis not present

## 2017-09-03 DIAGNOSIS — E785 Hyperlipidemia, unspecified: Secondary | ICD-10-CM | POA: Diagnosis not present

## 2017-09-03 DIAGNOSIS — G309 Alzheimer's disease, unspecified: Secondary | ICD-10-CM | POA: Diagnosis not present

## 2017-09-03 DIAGNOSIS — M109 Gout, unspecified: Secondary | ICD-10-CM | POA: Diagnosis not present

## 2017-09-03 DIAGNOSIS — L8990 Pressure ulcer of unspecified site, unspecified stage: Secondary | ICD-10-CM | POA: Diagnosis not present

## 2017-09-03 DIAGNOSIS — R1310 Dysphagia, unspecified: Secondary | ICD-10-CM | POA: Diagnosis not present

## 2017-09-04 DIAGNOSIS — E785 Hyperlipidemia, unspecified: Secondary | ICD-10-CM | POA: Diagnosis not present

## 2017-09-04 DIAGNOSIS — I10 Essential (primary) hypertension: Secondary | ICD-10-CM | POA: Diagnosis not present

## 2017-09-04 DIAGNOSIS — R1310 Dysphagia, unspecified: Secondary | ICD-10-CM | POA: Diagnosis not present

## 2017-09-04 DIAGNOSIS — J309 Allergic rhinitis, unspecified: Secondary | ICD-10-CM | POA: Diagnosis not present

## 2017-09-04 DIAGNOSIS — M109 Gout, unspecified: Secondary | ICD-10-CM | POA: Diagnosis not present

## 2017-09-04 DIAGNOSIS — G309 Alzheimer's disease, unspecified: Secondary | ICD-10-CM | POA: Diagnosis not present

## 2017-09-04 DIAGNOSIS — N183 Chronic kidney disease, stage 3 (moderate): Secondary | ICD-10-CM | POA: Diagnosis not present

## 2017-09-04 DIAGNOSIS — M2450 Contracture, unspecified joint: Secondary | ICD-10-CM | POA: Diagnosis not present

## 2017-09-04 DIAGNOSIS — I679 Cerebrovascular disease, unspecified: Secondary | ICD-10-CM | POA: Diagnosis not present

## 2017-09-04 DIAGNOSIS — L8990 Pressure ulcer of unspecified site, unspecified stage: Secondary | ICD-10-CM | POA: Diagnosis not present

## 2017-09-05 DIAGNOSIS — L8990 Pressure ulcer of unspecified site, unspecified stage: Secondary | ICD-10-CM | POA: Diagnosis not present

## 2017-09-05 DIAGNOSIS — M109 Gout, unspecified: Secondary | ICD-10-CM | POA: Diagnosis not present

## 2017-09-05 DIAGNOSIS — G309 Alzheimer's disease, unspecified: Secondary | ICD-10-CM | POA: Diagnosis not present

## 2017-09-05 DIAGNOSIS — E785 Hyperlipidemia, unspecified: Secondary | ICD-10-CM | POA: Diagnosis not present

## 2017-09-05 DIAGNOSIS — M2450 Contracture, unspecified joint: Secondary | ICD-10-CM | POA: Diagnosis not present

## 2017-09-05 DIAGNOSIS — I679 Cerebrovascular disease, unspecified: Secondary | ICD-10-CM | POA: Diagnosis not present

## 2017-09-05 DIAGNOSIS — J309 Allergic rhinitis, unspecified: Secondary | ICD-10-CM | POA: Diagnosis not present

## 2017-09-05 DIAGNOSIS — R1310 Dysphagia, unspecified: Secondary | ICD-10-CM | POA: Diagnosis not present

## 2017-09-05 DIAGNOSIS — I10 Essential (primary) hypertension: Secondary | ICD-10-CM | POA: Diagnosis not present

## 2017-09-05 DIAGNOSIS — N183 Chronic kidney disease, stage 3 (moderate): Secondary | ICD-10-CM | POA: Diagnosis not present

## 2017-09-06 DIAGNOSIS — I10 Essential (primary) hypertension: Secondary | ICD-10-CM | POA: Diagnosis not present

## 2017-09-06 DIAGNOSIS — I679 Cerebrovascular disease, unspecified: Secondary | ICD-10-CM | POA: Diagnosis not present

## 2017-09-06 DIAGNOSIS — N183 Chronic kidney disease, stage 3 (moderate): Secondary | ICD-10-CM | POA: Diagnosis not present

## 2017-09-06 DIAGNOSIS — J309 Allergic rhinitis, unspecified: Secondary | ICD-10-CM | POA: Diagnosis not present

## 2017-09-06 DIAGNOSIS — G309 Alzheimer's disease, unspecified: Secondary | ICD-10-CM | POA: Diagnosis not present

## 2017-09-06 DIAGNOSIS — L8990 Pressure ulcer of unspecified site, unspecified stage: Secondary | ICD-10-CM | POA: Diagnosis not present

## 2017-09-06 DIAGNOSIS — M109 Gout, unspecified: Secondary | ICD-10-CM | POA: Diagnosis not present

## 2017-09-06 DIAGNOSIS — R1310 Dysphagia, unspecified: Secondary | ICD-10-CM | POA: Diagnosis not present

## 2017-09-06 DIAGNOSIS — E785 Hyperlipidemia, unspecified: Secondary | ICD-10-CM | POA: Diagnosis not present

## 2017-09-06 DIAGNOSIS — L89152 Pressure ulcer of sacral region, stage 2: Secondary | ICD-10-CM | POA: Diagnosis not present

## 2017-09-06 DIAGNOSIS — M2450 Contracture, unspecified joint: Secondary | ICD-10-CM | POA: Diagnosis not present

## 2017-09-07 DIAGNOSIS — M109 Gout, unspecified: Secondary | ICD-10-CM | POA: Diagnosis not present

## 2017-09-07 DIAGNOSIS — L8990 Pressure ulcer of unspecified site, unspecified stage: Secondary | ICD-10-CM | POA: Diagnosis not present

## 2017-09-07 DIAGNOSIS — I679 Cerebrovascular disease, unspecified: Secondary | ICD-10-CM | POA: Diagnosis not present

## 2017-09-07 DIAGNOSIS — J309 Allergic rhinitis, unspecified: Secondary | ICD-10-CM | POA: Diagnosis not present

## 2017-09-07 DIAGNOSIS — G309 Alzheimer's disease, unspecified: Secondary | ICD-10-CM | POA: Diagnosis not present

## 2017-09-07 DIAGNOSIS — I10 Essential (primary) hypertension: Secondary | ICD-10-CM | POA: Diagnosis not present

## 2017-09-07 DIAGNOSIS — E785 Hyperlipidemia, unspecified: Secondary | ICD-10-CM | POA: Diagnosis not present

## 2017-09-07 DIAGNOSIS — R1310 Dysphagia, unspecified: Secondary | ICD-10-CM | POA: Diagnosis not present

## 2017-09-07 DIAGNOSIS — M2450 Contracture, unspecified joint: Secondary | ICD-10-CM | POA: Diagnosis not present

## 2017-09-07 DIAGNOSIS — N183 Chronic kidney disease, stage 3 (moderate): Secondary | ICD-10-CM | POA: Diagnosis not present

## 2017-09-08 DIAGNOSIS — M109 Gout, unspecified: Secondary | ICD-10-CM | POA: Diagnosis not present

## 2017-09-08 DIAGNOSIS — L8990 Pressure ulcer of unspecified site, unspecified stage: Secondary | ICD-10-CM | POA: Diagnosis not present

## 2017-09-08 DIAGNOSIS — G309 Alzheimer's disease, unspecified: Secondary | ICD-10-CM | POA: Diagnosis not present

## 2017-09-08 DIAGNOSIS — R1310 Dysphagia, unspecified: Secondary | ICD-10-CM | POA: Diagnosis not present

## 2017-09-08 DIAGNOSIS — E785 Hyperlipidemia, unspecified: Secondary | ICD-10-CM | POA: Diagnosis not present

## 2017-09-08 DIAGNOSIS — M2450 Contracture, unspecified joint: Secondary | ICD-10-CM | POA: Diagnosis not present

## 2017-09-08 DIAGNOSIS — I679 Cerebrovascular disease, unspecified: Secondary | ICD-10-CM | POA: Diagnosis not present

## 2017-09-08 DIAGNOSIS — N183 Chronic kidney disease, stage 3 (moderate): Secondary | ICD-10-CM | POA: Diagnosis not present

## 2017-09-08 DIAGNOSIS — I10 Essential (primary) hypertension: Secondary | ICD-10-CM | POA: Diagnosis not present

## 2017-09-08 DIAGNOSIS — J309 Allergic rhinitis, unspecified: Secondary | ICD-10-CM | POA: Diagnosis not present

## 2017-09-09 DIAGNOSIS — M2450 Contracture, unspecified joint: Secondary | ICD-10-CM | POA: Diagnosis not present

## 2017-09-09 DIAGNOSIS — E785 Hyperlipidemia, unspecified: Secondary | ICD-10-CM | POA: Diagnosis not present

## 2017-09-09 DIAGNOSIS — L8990 Pressure ulcer of unspecified site, unspecified stage: Secondary | ICD-10-CM | POA: Diagnosis not present

## 2017-09-09 DIAGNOSIS — G309 Alzheimer's disease, unspecified: Secondary | ICD-10-CM | POA: Diagnosis not present

## 2017-09-09 DIAGNOSIS — I10 Essential (primary) hypertension: Secondary | ICD-10-CM | POA: Diagnosis not present

## 2017-09-09 DIAGNOSIS — N183 Chronic kidney disease, stage 3 (moderate): Secondary | ICD-10-CM | POA: Diagnosis not present

## 2017-09-09 DIAGNOSIS — J309 Allergic rhinitis, unspecified: Secondary | ICD-10-CM | POA: Diagnosis not present

## 2017-09-09 DIAGNOSIS — M109 Gout, unspecified: Secondary | ICD-10-CM | POA: Diagnosis not present

## 2017-09-09 DIAGNOSIS — I679 Cerebrovascular disease, unspecified: Secondary | ICD-10-CM | POA: Diagnosis not present

## 2017-09-09 DIAGNOSIS — R1310 Dysphagia, unspecified: Secondary | ICD-10-CM | POA: Diagnosis not present

## 2017-09-10 DIAGNOSIS — R1310 Dysphagia, unspecified: Secondary | ICD-10-CM | POA: Diagnosis not present

## 2017-09-10 DIAGNOSIS — G309 Alzheimer's disease, unspecified: Secondary | ICD-10-CM | POA: Diagnosis not present

## 2017-09-10 DIAGNOSIS — I679 Cerebrovascular disease, unspecified: Secondary | ICD-10-CM | POA: Diagnosis not present

## 2017-09-10 DIAGNOSIS — L8990 Pressure ulcer of unspecified site, unspecified stage: Secondary | ICD-10-CM | POA: Diagnosis not present

## 2017-09-10 DIAGNOSIS — M2450 Contracture, unspecified joint: Secondary | ICD-10-CM | POA: Diagnosis not present

## 2017-09-10 DIAGNOSIS — N183 Chronic kidney disease, stage 3 (moderate): Secondary | ICD-10-CM | POA: Diagnosis not present

## 2017-09-10 DIAGNOSIS — M109 Gout, unspecified: Secondary | ICD-10-CM | POA: Diagnosis not present

## 2017-09-10 DIAGNOSIS — J309 Allergic rhinitis, unspecified: Secondary | ICD-10-CM | POA: Diagnosis not present

## 2017-09-10 DIAGNOSIS — E785 Hyperlipidemia, unspecified: Secondary | ICD-10-CM | POA: Diagnosis not present

## 2017-09-10 DIAGNOSIS — I10 Essential (primary) hypertension: Secondary | ICD-10-CM | POA: Diagnosis not present

## 2017-09-11 DIAGNOSIS — M2450 Contracture, unspecified joint: Secondary | ICD-10-CM | POA: Diagnosis not present

## 2017-09-11 DIAGNOSIS — E785 Hyperlipidemia, unspecified: Secondary | ICD-10-CM | POA: Diagnosis not present

## 2017-09-11 DIAGNOSIS — R1310 Dysphagia, unspecified: Secondary | ICD-10-CM | POA: Diagnosis not present

## 2017-09-11 DIAGNOSIS — J309 Allergic rhinitis, unspecified: Secondary | ICD-10-CM | POA: Diagnosis not present

## 2017-09-11 DIAGNOSIS — M109 Gout, unspecified: Secondary | ICD-10-CM | POA: Diagnosis not present

## 2017-09-11 DIAGNOSIS — I679 Cerebrovascular disease, unspecified: Secondary | ICD-10-CM | POA: Diagnosis not present

## 2017-09-11 DIAGNOSIS — N183 Chronic kidney disease, stage 3 (moderate): Secondary | ICD-10-CM | POA: Diagnosis not present

## 2017-09-11 DIAGNOSIS — L8990 Pressure ulcer of unspecified site, unspecified stage: Secondary | ICD-10-CM | POA: Diagnosis not present

## 2017-09-11 DIAGNOSIS — G309 Alzheimer's disease, unspecified: Secondary | ICD-10-CM | POA: Diagnosis not present

## 2017-09-11 DIAGNOSIS — I10 Essential (primary) hypertension: Secondary | ICD-10-CM | POA: Diagnosis not present

## 2017-09-12 DIAGNOSIS — I10 Essential (primary) hypertension: Secondary | ICD-10-CM | POA: Diagnosis not present

## 2017-09-12 DIAGNOSIS — N183 Chronic kidney disease, stage 3 (moderate): Secondary | ICD-10-CM | POA: Diagnosis not present

## 2017-09-12 DIAGNOSIS — I679 Cerebrovascular disease, unspecified: Secondary | ICD-10-CM | POA: Diagnosis not present

## 2017-09-12 DIAGNOSIS — L8990 Pressure ulcer of unspecified site, unspecified stage: Secondary | ICD-10-CM | POA: Diagnosis not present

## 2017-09-12 DIAGNOSIS — E785 Hyperlipidemia, unspecified: Secondary | ICD-10-CM | POA: Diagnosis not present

## 2017-09-12 DIAGNOSIS — M2450 Contracture, unspecified joint: Secondary | ICD-10-CM | POA: Diagnosis not present

## 2017-09-12 DIAGNOSIS — R1310 Dysphagia, unspecified: Secondary | ICD-10-CM | POA: Diagnosis not present

## 2017-09-12 DIAGNOSIS — G309 Alzheimer's disease, unspecified: Secondary | ICD-10-CM | POA: Diagnosis not present

## 2017-09-12 DIAGNOSIS — J309 Allergic rhinitis, unspecified: Secondary | ICD-10-CM | POA: Diagnosis not present

## 2017-09-12 DIAGNOSIS — M109 Gout, unspecified: Secondary | ICD-10-CM | POA: Diagnosis not present

## 2017-09-13 DIAGNOSIS — E785 Hyperlipidemia, unspecified: Secondary | ICD-10-CM | POA: Diagnosis not present

## 2017-09-13 DIAGNOSIS — N183 Chronic kidney disease, stage 3 (moderate): Secondary | ICD-10-CM | POA: Diagnosis not present

## 2017-09-13 DIAGNOSIS — L8990 Pressure ulcer of unspecified site, unspecified stage: Secondary | ICD-10-CM | POA: Diagnosis not present

## 2017-09-13 DIAGNOSIS — M2450 Contracture, unspecified joint: Secondary | ICD-10-CM | POA: Diagnosis not present

## 2017-09-13 DIAGNOSIS — M109 Gout, unspecified: Secondary | ICD-10-CM | POA: Diagnosis not present

## 2017-09-13 DIAGNOSIS — R1310 Dysphagia, unspecified: Secondary | ICD-10-CM | POA: Diagnosis not present

## 2017-09-13 DIAGNOSIS — G309 Alzheimer's disease, unspecified: Secondary | ICD-10-CM | POA: Diagnosis not present

## 2017-09-13 DIAGNOSIS — I10 Essential (primary) hypertension: Secondary | ICD-10-CM | POA: Diagnosis not present

## 2017-09-13 DIAGNOSIS — L89152 Pressure ulcer of sacral region, stage 2: Secondary | ICD-10-CM | POA: Diagnosis not present

## 2017-09-13 DIAGNOSIS — J309 Allergic rhinitis, unspecified: Secondary | ICD-10-CM | POA: Diagnosis not present

## 2017-09-13 DIAGNOSIS — I679 Cerebrovascular disease, unspecified: Secondary | ICD-10-CM | POA: Diagnosis not present

## 2017-09-14 DIAGNOSIS — L8990 Pressure ulcer of unspecified site, unspecified stage: Secondary | ICD-10-CM | POA: Diagnosis not present

## 2017-09-14 DIAGNOSIS — M2450 Contracture, unspecified joint: Secondary | ICD-10-CM | POA: Diagnosis not present

## 2017-09-14 DIAGNOSIS — E785 Hyperlipidemia, unspecified: Secondary | ICD-10-CM | POA: Diagnosis not present

## 2017-09-14 DIAGNOSIS — J309 Allergic rhinitis, unspecified: Secondary | ICD-10-CM | POA: Diagnosis not present

## 2017-09-14 DIAGNOSIS — I10 Essential (primary) hypertension: Secondary | ICD-10-CM | POA: Diagnosis not present

## 2017-09-14 DIAGNOSIS — R1310 Dysphagia, unspecified: Secondary | ICD-10-CM | POA: Diagnosis not present

## 2017-09-14 DIAGNOSIS — G309 Alzheimer's disease, unspecified: Secondary | ICD-10-CM | POA: Diagnosis not present

## 2017-09-14 DIAGNOSIS — N183 Chronic kidney disease, stage 3 (moderate): Secondary | ICD-10-CM | POA: Diagnosis not present

## 2017-09-14 DIAGNOSIS — M109 Gout, unspecified: Secondary | ICD-10-CM | POA: Diagnosis not present

## 2017-09-14 DIAGNOSIS — I679 Cerebrovascular disease, unspecified: Secondary | ICD-10-CM | POA: Diagnosis not present

## 2017-09-15 DIAGNOSIS — I10 Essential (primary) hypertension: Secondary | ICD-10-CM | POA: Diagnosis not present

## 2017-09-15 DIAGNOSIS — J309 Allergic rhinitis, unspecified: Secondary | ICD-10-CM | POA: Diagnosis not present

## 2017-09-15 DIAGNOSIS — G309 Alzheimer's disease, unspecified: Secondary | ICD-10-CM | POA: Diagnosis not present

## 2017-09-15 DIAGNOSIS — M109 Gout, unspecified: Secondary | ICD-10-CM | POA: Diagnosis not present

## 2017-09-15 DIAGNOSIS — M2450 Contracture, unspecified joint: Secondary | ICD-10-CM | POA: Diagnosis not present

## 2017-09-15 DIAGNOSIS — L8990 Pressure ulcer of unspecified site, unspecified stage: Secondary | ICD-10-CM | POA: Diagnosis not present

## 2017-09-15 DIAGNOSIS — R1310 Dysphagia, unspecified: Secondary | ICD-10-CM | POA: Diagnosis not present

## 2017-09-15 DIAGNOSIS — N183 Chronic kidney disease, stage 3 (moderate): Secondary | ICD-10-CM | POA: Diagnosis not present

## 2017-09-15 DIAGNOSIS — I679 Cerebrovascular disease, unspecified: Secondary | ICD-10-CM | POA: Diagnosis not present

## 2017-09-15 DIAGNOSIS — E785 Hyperlipidemia, unspecified: Secondary | ICD-10-CM | POA: Diagnosis not present

## 2017-09-16 DIAGNOSIS — N183 Chronic kidney disease, stage 3 (moderate): Secondary | ICD-10-CM | POA: Diagnosis not present

## 2017-09-16 DIAGNOSIS — L8990 Pressure ulcer of unspecified site, unspecified stage: Secondary | ICD-10-CM | POA: Diagnosis not present

## 2017-09-16 DIAGNOSIS — I679 Cerebrovascular disease, unspecified: Secondary | ICD-10-CM | POA: Diagnosis not present

## 2017-09-16 DIAGNOSIS — J309 Allergic rhinitis, unspecified: Secondary | ICD-10-CM | POA: Diagnosis not present

## 2017-09-16 DIAGNOSIS — M109 Gout, unspecified: Secondary | ICD-10-CM | POA: Diagnosis not present

## 2017-09-16 DIAGNOSIS — M2450 Contracture, unspecified joint: Secondary | ICD-10-CM | POA: Diagnosis not present

## 2017-09-16 DIAGNOSIS — E785 Hyperlipidemia, unspecified: Secondary | ICD-10-CM | POA: Diagnosis not present

## 2017-09-16 DIAGNOSIS — G309 Alzheimer's disease, unspecified: Secondary | ICD-10-CM | POA: Diagnosis not present

## 2017-09-16 DIAGNOSIS — R1310 Dysphagia, unspecified: Secondary | ICD-10-CM | POA: Diagnosis not present

## 2017-09-16 DIAGNOSIS — I10 Essential (primary) hypertension: Secondary | ICD-10-CM | POA: Diagnosis not present

## 2017-09-17 DIAGNOSIS — R1310 Dysphagia, unspecified: Secondary | ICD-10-CM | POA: Diagnosis not present

## 2017-09-17 DIAGNOSIS — G309 Alzheimer's disease, unspecified: Secondary | ICD-10-CM | POA: Diagnosis not present

## 2017-09-17 DIAGNOSIS — M2450 Contracture, unspecified joint: Secondary | ICD-10-CM | POA: Diagnosis not present

## 2017-09-17 DIAGNOSIS — L8990 Pressure ulcer of unspecified site, unspecified stage: Secondary | ICD-10-CM | POA: Diagnosis not present

## 2017-09-17 DIAGNOSIS — M109 Gout, unspecified: Secondary | ICD-10-CM | POA: Diagnosis not present

## 2017-09-17 DIAGNOSIS — I10 Essential (primary) hypertension: Secondary | ICD-10-CM | POA: Diagnosis not present

## 2017-09-17 DIAGNOSIS — J309 Allergic rhinitis, unspecified: Secondary | ICD-10-CM | POA: Diagnosis not present

## 2017-09-17 DIAGNOSIS — I679 Cerebrovascular disease, unspecified: Secondary | ICD-10-CM | POA: Diagnosis not present

## 2017-09-17 DIAGNOSIS — N183 Chronic kidney disease, stage 3 (moderate): Secondary | ICD-10-CM | POA: Diagnosis not present

## 2017-09-17 DIAGNOSIS — E785 Hyperlipidemia, unspecified: Secondary | ICD-10-CM | POA: Diagnosis not present

## 2017-09-18 DIAGNOSIS — M2450 Contracture, unspecified joint: Secondary | ICD-10-CM | POA: Diagnosis not present

## 2017-09-18 DIAGNOSIS — M109 Gout, unspecified: Secondary | ICD-10-CM | POA: Diagnosis not present

## 2017-09-18 DIAGNOSIS — I679 Cerebrovascular disease, unspecified: Secondary | ICD-10-CM | POA: Diagnosis not present

## 2017-09-18 DIAGNOSIS — E785 Hyperlipidemia, unspecified: Secondary | ICD-10-CM | POA: Diagnosis not present

## 2017-09-18 DIAGNOSIS — J309 Allergic rhinitis, unspecified: Secondary | ICD-10-CM | POA: Diagnosis not present

## 2017-09-18 DIAGNOSIS — G309 Alzheimer's disease, unspecified: Secondary | ICD-10-CM | POA: Diagnosis not present

## 2017-09-18 DIAGNOSIS — R1310 Dysphagia, unspecified: Secondary | ICD-10-CM | POA: Diagnosis not present

## 2017-09-18 DIAGNOSIS — I10 Essential (primary) hypertension: Secondary | ICD-10-CM | POA: Diagnosis not present

## 2017-09-18 DIAGNOSIS — L8990 Pressure ulcer of unspecified site, unspecified stage: Secondary | ICD-10-CM | POA: Diagnosis not present

## 2017-09-18 DIAGNOSIS — N183 Chronic kidney disease, stage 3 (moderate): Secondary | ICD-10-CM | POA: Diagnosis not present

## 2017-09-19 DIAGNOSIS — I10 Essential (primary) hypertension: Secondary | ICD-10-CM | POA: Diagnosis not present

## 2017-09-19 DIAGNOSIS — G309 Alzheimer's disease, unspecified: Secondary | ICD-10-CM | POA: Diagnosis not present

## 2017-09-19 DIAGNOSIS — I679 Cerebrovascular disease, unspecified: Secondary | ICD-10-CM | POA: Diagnosis not present

## 2017-09-19 DIAGNOSIS — R1310 Dysphagia, unspecified: Secondary | ICD-10-CM | POA: Diagnosis not present

## 2017-09-19 DIAGNOSIS — L8990 Pressure ulcer of unspecified site, unspecified stage: Secondary | ICD-10-CM | POA: Diagnosis not present

## 2017-09-19 DIAGNOSIS — J309 Allergic rhinitis, unspecified: Secondary | ICD-10-CM | POA: Diagnosis not present

## 2017-09-19 DIAGNOSIS — M109 Gout, unspecified: Secondary | ICD-10-CM | POA: Diagnosis not present

## 2017-09-19 DIAGNOSIS — N183 Chronic kidney disease, stage 3 (moderate): Secondary | ICD-10-CM | POA: Diagnosis not present

## 2017-09-19 DIAGNOSIS — E785 Hyperlipidemia, unspecified: Secondary | ICD-10-CM | POA: Diagnosis not present

## 2017-09-19 DIAGNOSIS — M2450 Contracture, unspecified joint: Secondary | ICD-10-CM | POA: Diagnosis not present

## 2017-09-20 ENCOUNTER — Non-Acute Institutional Stay (SKILLED_NURSING_FACILITY): Payer: Medicare Other | Admitting: Adult Health

## 2017-09-20 ENCOUNTER — Encounter: Payer: Self-pay | Admitting: Adult Health

## 2017-09-20 DIAGNOSIS — L8990 Pressure ulcer of unspecified site, unspecified stage: Secondary | ICD-10-CM | POA: Diagnosis not present

## 2017-09-20 DIAGNOSIS — R1310 Dysphagia, unspecified: Secondary | ICD-10-CM

## 2017-09-20 DIAGNOSIS — D638 Anemia in other chronic diseases classified elsewhere: Secondary | ICD-10-CM | POA: Diagnosis not present

## 2017-09-20 DIAGNOSIS — J309 Allergic rhinitis, unspecified: Secondary | ICD-10-CM | POA: Diagnosis not present

## 2017-09-20 DIAGNOSIS — G309 Alzheimer's disease, unspecified: Secondary | ICD-10-CM | POA: Diagnosis not present

## 2017-09-20 DIAGNOSIS — N183 Chronic kidney disease, stage 3 (moderate): Secondary | ICD-10-CM | POA: Diagnosis not present

## 2017-09-20 DIAGNOSIS — I679 Cerebrovascular disease, unspecified: Secondary | ICD-10-CM | POA: Diagnosis not present

## 2017-09-20 DIAGNOSIS — M109 Gout, unspecified: Secondary | ICD-10-CM | POA: Diagnosis not present

## 2017-09-20 DIAGNOSIS — E785 Hyperlipidemia, unspecified: Secondary | ICD-10-CM | POA: Diagnosis not present

## 2017-09-20 DIAGNOSIS — F0280 Dementia in other diseases classified elsewhere without behavioral disturbance: Secondary | ICD-10-CM

## 2017-09-20 DIAGNOSIS — I10 Essential (primary) hypertension: Secondary | ICD-10-CM

## 2017-09-20 DIAGNOSIS — R630 Anorexia: Secondary | ICD-10-CM

## 2017-09-20 DIAGNOSIS — L89152 Pressure ulcer of sacral region, stage 2: Secondary | ICD-10-CM | POA: Diagnosis not present

## 2017-09-20 DIAGNOSIS — L89150 Pressure ulcer of sacral region, unstageable: Secondary | ICD-10-CM

## 2017-09-20 DIAGNOSIS — M2450 Contracture, unspecified joint: Secondary | ICD-10-CM | POA: Diagnosis not present

## 2017-09-20 NOTE — Progress Notes (Signed)
Location:  [facility] Room Number: 117-A Place of Service:  SNF (31) Provider:  Kenard GowerMedina-Vargas, Monina, NP  Patient Care Team: Pecola LawlessHopper, William F, MD as PCP - General (Internal Medicine) Gillis SantaMedina-Vargas, Monina C, NP as Nurse Practitioner (Internal Medicine)  Extended Emergency Contact Information Primary Emergency Contact: Pitz,[name] Address: [address]          [place], [place] [address] [place] Home Phone: [phone] Mobile Phone: [phone] Relation: Spouse Secondary Emergency Contact: Romeo AppleWilliamson,[name] J  [place] Mobile Phone: [phone] Relation: Son  Code Status:  DNR  Goals of care: Advanced Directive information Advanced Directives 09/20/2017  Does Patient Have a Medical Advance Directive? Yes  Type of Advance Directive Out of facility DNR (pink MOST or yellow form)  Does patient want to make changes to medical advance directive? No - Patient declined  Copy of Healthcare Power of Attorney in Chart? -  Would patient like information on creating a medical advance directive? -  Pre-existing out of facility DNR order (yellow form or pink MOST form) -     Chief Complaint  Patient presents with  . Medical Management of Chronic Issues    Routine [facility] visit    HPI:  Pt is a 72 y.o. female seen today for medical management of chronic diseases. She is a long-[facility] care resident of [facility], but [facility] for sepsis.  She has a PMH of CKD, history of CVA in 2013, frequent falls, HTN, HLD, chronic constipation, Alzheimer's disease, and nonverbal and nonambulatory at baseline. She was seen in the room today with husband at bedside feeding her. Patient noted to have her eyes closed, drool one time and was chewing. She was sitting on a broda-chair.   Past Medical History:  Diagnosis Date  . Dementia    "alzheimer's" (05/18/2012)  . Fall  05/17/2012  . Fracture of medial wall of orbit (HCC) 05/17/2012  . Gout    "? feet" (05/18/2012); uric acid 9.4 on 08/06/16 with R wrist pain; 7.1 on 08/15/16 on Allopurinol  . Hypertension   . Incontinence of urine   . Stroke [facility](HCC) ~ 2003   "slight memory loss" (05/18/2012)  . Wrist fracture, bilateral 05/17/2012   "fell down steps" (05/18/2012)   Past Surgical History:  Procedure Laterality Date  . fractured tooth     01/06/17 Dr Ocie DoyneScott Jensen DMD extracted tooth  #4   . VAGINAL HYSTERECTOMY      Allergies  Allergen Reactions  . Pollen Extract Other (See Comments)    rhinitis    Outpatient Encounter Medications as of 09/20/2017  Medication Sig  . acetaminophen (TYLENOL) 325 MG tablet Take 650 mg by mouth every 6 (six) hours as needed for moderate pain or fever.  Marland Kitchen. CALCIUM-VITAMIN D PO Take 500 mg by mouth daily.  . cloNIDine (CATAPRES) 0.1 MG tablet Take 0.1 mg by mouth 2 (two) times daily.   Marland Kitchen. labetalol (NORMODYNE) 300 MG tablet Take 300 mg by mouth 2 (two) times daily.  . Multiple Vitamins-Minerals (ELDERTONIC PO) Take 15 mLs by mouth 2 (two) times daily.  . Nutritional Supplements (NUTRITIONAL SUPPLEMENT PO) Take 1 each by mouth 2 (two) times daily. Magic Cup  . polyethylene glycol (MIRALAX / GLYCOLAX) packet Take 17 g by mouth daily as needed.   . senna-docusate (SENOKOT-S) 8.6-50 MG tablet Take 1 tablet by mouth 2 (two) times daily.  . [DISCONTINUED] allopurinol (ZYLOPRIM) 150 mg TABS tablet Take 150  mg by mouth daily.   . [DISCONTINUED] AMBULATORY NON FORMULARY MEDICATION Take 1 each by mouth 2 (two) times daily. Give 1 magic cup by mouth twice a day for supplement.   . [DISCONTINUED] atorvastatin (LIPITOR) 10 MG tablet Take 10 mg by mouth daily.  . [DISCONTINUED] Docusate Sodium (COLACE PO) Take 100 mg by mouth 2 (two) times daily.   . [DISCONTINUED] linaclotide (LINZESS) 72 MCG capsule Take 72 mcg by mouth every other day.   No facility-administered encounter medications on  file as of 09/20/2017.     Review of Systems Unable to obtain due to dementia     Immunization History  Administered Date(s) Administered  . Influenza-Unspecified 05/10/2013, 05/22/2015, 05/14/2016, 05/23/2017  . Pneumococcal-Unspecified 06/03/2012, 05/14/2016   Pertinent  Health Maintenance Due  Topic Date Due  . PNA vac Low Risk Adult (2 of 2 - PCV13) 07/21/2018 (Originally 05/14/2017)  . MAMMOGRAM  03/09/2024 (Originally 11/26/2014)  . COLONOSCOPY  03/09/2024 (Originally 09/25/1993)  . INFLUENZA VACCINE  Completed  . DEXA SCAN  Discontinued   Fall Risk  04/06/2017 09/23/2016 09/11/2016 06/10/2016 04/10/2016  Falls in the past year? Yes No No No Exclusion - non ambulatory  Number falls in past yr: 1 - - - -  Injury with Fall? Yes - - - -      Vitals:   09/20/17 1202  BP: 129/67  Pulse: 74  Resp: 19  Temp: (!) 97.1 F (36.2 C)  TempSrc: Oral  SpO2: 96%  Weight: 120 lb 3.2 oz (54.5 kg)  Height: 5\' 1"  (1.549 m)   Body mass index is 22.71 kg/m.  Physical Exam  GENERAL APPEARANCE: Well nourished. In no acute distress. Normal body habitus SKIN:  Sacral wound, unstageable, 3 X 1.8 cm.  MOUTH and THROAT: Lips are without lesions.  RESPIRATORY: Breathing is even & unlabored, BS CTAB CARDIAC: RRR, no murmur,no extra heart sounds, no edema GI: Abdomen soft, normal BS, no masses, no tenderness EXTREMITIES:  BUE are contracted, did not move X 4 extremities PSYCHIATRIC: Non-verbal. Affect and behavior are appropriate  Labs reviewed: Recent Labs    08/11/17 1349  08/13/17 0431 08/14/17 0214  08/16/17 0242 08/17/17 0309 08/18/17 0223  NA 156*   < > 150* 148*   < > 143 143 142  K 3.5   < > 3.1* 3.5   < > 3.2* 4.2 3.6  CL 128*   < > 123* 120*   < > 114* 114* 113*  CO2 20*   < > 18* 18*   < > 17* 14* 21*  GLUCOSE 97   < > 135* 115*   < > 123* 102* 120*  BUN 40*   < > 19 9   < > 15 17 20   CREATININE 1.95*   < > 1.51* 1.33*   < > 1.54* 1.48* 1.54*  CALCIUM 9.2   < > 8.5* 8.8*    < > 8.7* 8.7* 8.7*  MG 2.5*  --  2.0 1.8  --   --   --   --   PHOS 3.5  --   --   --   --   --   --   --    < > = values in this interval not displayed.   Recent Labs    08/11/17 0736 08/12/17 0249  AST 18 16  ALT 15 12*  ALKPHOS 77 71  BILITOT 0.6 1.0  PROT 8.3* 7.1  ALBUMIN 3.7 3.2*   Recent Labs  08/11/17 0736  08/14/17 2210  08/16/17 0242 08/17/17 0309 08/18/17 0223  WBC 12.3*   < > 11.0*   < > 15.9* 19.4* 16.0*  NEUTROABS 9.5*  --  8.5*  --   --   --   --   HGB 13.6   < > 14.8   < > 13.0 13.5 10.5*  HCT 44.3   < > 44.4   < > 40.0 39.4 32.9*  MCV 98.7   < > 92.1   < > 91.5 91.8 92.9  PLT 285   < > PLATELET CLUMPS NOTED ON SMEAR, COUNT APPEARS ADEQUATE   < > 326 296 341   < > = values in this interval not displayed.   Lab Results  Component Value Date   TSH 1.61 09/18/2016    Lab Results  Component Value Date   CHOL 126 12/17/2016   HDL 35 12/17/2016   LDLCALC 69 12/17/2016   TRIG 109 12/17/2016    Assessment/Plan  1. Essential hypertension, benign - well-controlled, continue clonidine 0.1 mg 1 tab twice a day and labetalol 300 mg 1 tab twice a day   2. Pressure injury of sacral region, unstageable (HCC) - continue wound treatment daily, turn to sides when in bed, keep skin clean and dry   3. Dysphagia, unspecified type - continue pured diet with honey liquids, aspiration precautions   4. Anemia of chronic disease - stable Lab Results  Component Value Date   HGB 10.5 (L) 08/18/2017     5. Alzheimer's dementia without behavioral disturbance, unspecified timing of dementia onset - continue supportive care, fall precautions, currently followed by hospice  6. Poor appetite - Body mass index is 22.71 kg/m., weight is stable, continue Eldertonic 15 mL twice a day    Family/ staff Communication: Discussed plan of care with husband.  Labs/tests ordered:  None  Goals of care:   Long-term care, Hospice care   Kenard Gower, NP [facility] and Adult Medicine [phone] (Monday-Friday 8:00 a.m. - 5:00 p.m.) [phone] (after hours)

## 2017-09-21 DIAGNOSIS — I679 Cerebrovascular disease, unspecified: Secondary | ICD-10-CM | POA: Diagnosis not present

## 2017-09-21 DIAGNOSIS — I10 Essential (primary) hypertension: Secondary | ICD-10-CM | POA: Diagnosis not present

## 2017-09-21 DIAGNOSIS — E785 Hyperlipidemia, unspecified: Secondary | ICD-10-CM | POA: Diagnosis not present

## 2017-09-21 DIAGNOSIS — G309 Alzheimer's disease, unspecified: Secondary | ICD-10-CM | POA: Diagnosis not present

## 2017-09-21 DIAGNOSIS — M109 Gout, unspecified: Secondary | ICD-10-CM | POA: Diagnosis not present

## 2017-09-21 DIAGNOSIS — M2450 Contracture, unspecified joint: Secondary | ICD-10-CM | POA: Diagnosis not present

## 2017-09-21 DIAGNOSIS — L8990 Pressure ulcer of unspecified site, unspecified stage: Secondary | ICD-10-CM | POA: Diagnosis not present

## 2017-09-21 DIAGNOSIS — N183 Chronic kidney disease, stage 3 (moderate): Secondary | ICD-10-CM | POA: Diagnosis not present

## 2017-09-21 DIAGNOSIS — J309 Allergic rhinitis, unspecified: Secondary | ICD-10-CM | POA: Diagnosis not present

## 2017-09-21 DIAGNOSIS — R1310 Dysphagia, unspecified: Secondary | ICD-10-CM | POA: Diagnosis not present

## 2017-09-22 DIAGNOSIS — M2450 Contracture, unspecified joint: Secondary | ICD-10-CM | POA: Diagnosis not present

## 2017-09-22 DIAGNOSIS — G309 Alzheimer's disease, unspecified: Secondary | ICD-10-CM | POA: Diagnosis not present

## 2017-09-22 DIAGNOSIS — M109 Gout, unspecified: Secondary | ICD-10-CM | POA: Diagnosis not present

## 2017-09-22 DIAGNOSIS — I10 Essential (primary) hypertension: Secondary | ICD-10-CM | POA: Diagnosis not present

## 2017-09-22 DIAGNOSIS — J309 Allergic rhinitis, unspecified: Secondary | ICD-10-CM | POA: Diagnosis not present

## 2017-09-22 DIAGNOSIS — N183 Chronic kidney disease, stage 3 (moderate): Secondary | ICD-10-CM | POA: Diagnosis not present

## 2017-09-22 DIAGNOSIS — I679 Cerebrovascular disease, unspecified: Secondary | ICD-10-CM | POA: Diagnosis not present

## 2017-09-22 DIAGNOSIS — R1310 Dysphagia, unspecified: Secondary | ICD-10-CM | POA: Diagnosis not present

## 2017-09-22 DIAGNOSIS — L8990 Pressure ulcer of unspecified site, unspecified stage: Secondary | ICD-10-CM | POA: Diagnosis not present

## 2017-09-22 DIAGNOSIS — E785 Hyperlipidemia, unspecified: Secondary | ICD-10-CM | POA: Diagnosis not present

## 2017-09-23 DIAGNOSIS — I679 Cerebrovascular disease, unspecified: Secondary | ICD-10-CM | POA: Diagnosis not present

## 2017-09-23 DIAGNOSIS — M2450 Contracture, unspecified joint: Secondary | ICD-10-CM | POA: Diagnosis not present

## 2017-09-23 DIAGNOSIS — R1310 Dysphagia, unspecified: Secondary | ICD-10-CM | POA: Diagnosis not present

## 2017-09-23 DIAGNOSIS — J309 Allergic rhinitis, unspecified: Secondary | ICD-10-CM | POA: Diagnosis not present

## 2017-09-23 DIAGNOSIS — G309 Alzheimer's disease, unspecified: Secondary | ICD-10-CM | POA: Diagnosis not present

## 2017-09-23 DIAGNOSIS — M109 Gout, unspecified: Secondary | ICD-10-CM | POA: Diagnosis not present

## 2017-09-23 DIAGNOSIS — E785 Hyperlipidemia, unspecified: Secondary | ICD-10-CM | POA: Diagnosis not present

## 2017-09-23 DIAGNOSIS — L8990 Pressure ulcer of unspecified site, unspecified stage: Secondary | ICD-10-CM | POA: Diagnosis not present

## 2017-09-23 DIAGNOSIS — N183 Chronic kidney disease, stage 3 (moderate): Secondary | ICD-10-CM | POA: Diagnosis not present

## 2017-09-23 DIAGNOSIS — I10 Essential (primary) hypertension: Secondary | ICD-10-CM | POA: Diagnosis not present

## 2017-09-24 DIAGNOSIS — I679 Cerebrovascular disease, unspecified: Secondary | ICD-10-CM | POA: Diagnosis not present

## 2017-09-24 DIAGNOSIS — I10 Essential (primary) hypertension: Secondary | ICD-10-CM | POA: Diagnosis not present

## 2017-09-24 DIAGNOSIS — E785 Hyperlipidemia, unspecified: Secondary | ICD-10-CM | POA: Diagnosis not present

## 2017-09-24 DIAGNOSIS — G309 Alzheimer's disease, unspecified: Secondary | ICD-10-CM | POA: Diagnosis not present

## 2017-09-24 DIAGNOSIS — R1310 Dysphagia, unspecified: Secondary | ICD-10-CM | POA: Diagnosis not present

## 2017-09-24 DIAGNOSIS — J309 Allergic rhinitis, unspecified: Secondary | ICD-10-CM | POA: Diagnosis not present

## 2017-09-24 DIAGNOSIS — N183 Chronic kidney disease, stage 3 (moderate): Secondary | ICD-10-CM | POA: Diagnosis not present

## 2017-09-24 DIAGNOSIS — M2450 Contracture, unspecified joint: Secondary | ICD-10-CM | POA: Diagnosis not present

## 2017-09-24 DIAGNOSIS — M109 Gout, unspecified: Secondary | ICD-10-CM | POA: Diagnosis not present

## 2017-09-24 DIAGNOSIS — L8990 Pressure ulcer of unspecified site, unspecified stage: Secondary | ICD-10-CM | POA: Diagnosis not present

## 2017-09-25 DIAGNOSIS — G309 Alzheimer's disease, unspecified: Secondary | ICD-10-CM | POA: Diagnosis not present

## 2017-09-25 DIAGNOSIS — I679 Cerebrovascular disease, unspecified: Secondary | ICD-10-CM | POA: Diagnosis not present

## 2017-09-25 DIAGNOSIS — M2450 Contracture, unspecified joint: Secondary | ICD-10-CM | POA: Diagnosis not present

## 2017-09-25 DIAGNOSIS — N183 Chronic kidney disease, stage 3 (moderate): Secondary | ICD-10-CM | POA: Diagnosis not present

## 2017-09-25 DIAGNOSIS — M109 Gout, unspecified: Secondary | ICD-10-CM | POA: Diagnosis not present

## 2017-09-25 DIAGNOSIS — J309 Allergic rhinitis, unspecified: Secondary | ICD-10-CM | POA: Diagnosis not present

## 2017-09-25 DIAGNOSIS — E785 Hyperlipidemia, unspecified: Secondary | ICD-10-CM | POA: Diagnosis not present

## 2017-09-25 DIAGNOSIS — R1310 Dysphagia, unspecified: Secondary | ICD-10-CM | POA: Diagnosis not present

## 2017-09-25 DIAGNOSIS — I10 Essential (primary) hypertension: Secondary | ICD-10-CM | POA: Diagnosis not present

## 2017-09-25 DIAGNOSIS — L8990 Pressure ulcer of unspecified site, unspecified stage: Secondary | ICD-10-CM | POA: Diagnosis not present

## 2017-09-26 DIAGNOSIS — J309 Allergic rhinitis, unspecified: Secondary | ICD-10-CM | POA: Diagnosis not present

## 2017-09-26 DIAGNOSIS — N183 Chronic kidney disease, stage 3 (moderate): Secondary | ICD-10-CM | POA: Diagnosis not present

## 2017-09-26 DIAGNOSIS — I10 Essential (primary) hypertension: Secondary | ICD-10-CM | POA: Diagnosis not present

## 2017-09-26 DIAGNOSIS — E785 Hyperlipidemia, unspecified: Secondary | ICD-10-CM | POA: Diagnosis not present

## 2017-09-26 DIAGNOSIS — I679 Cerebrovascular disease, unspecified: Secondary | ICD-10-CM | POA: Diagnosis not present

## 2017-09-26 DIAGNOSIS — M2450 Contracture, unspecified joint: Secondary | ICD-10-CM | POA: Diagnosis not present

## 2017-09-26 DIAGNOSIS — R1310 Dysphagia, unspecified: Secondary | ICD-10-CM | POA: Diagnosis not present

## 2017-09-26 DIAGNOSIS — M109 Gout, unspecified: Secondary | ICD-10-CM | POA: Diagnosis not present

## 2017-09-26 DIAGNOSIS — L8990 Pressure ulcer of unspecified site, unspecified stage: Secondary | ICD-10-CM | POA: Diagnosis not present

## 2017-09-26 DIAGNOSIS — G309 Alzheimer's disease, unspecified: Secondary | ICD-10-CM | POA: Diagnosis not present

## 2017-09-27 DIAGNOSIS — J309 Allergic rhinitis, unspecified: Secondary | ICD-10-CM | POA: Diagnosis not present

## 2017-09-27 DIAGNOSIS — L8990 Pressure ulcer of unspecified site, unspecified stage: Secondary | ICD-10-CM | POA: Diagnosis not present

## 2017-09-27 DIAGNOSIS — I679 Cerebrovascular disease, unspecified: Secondary | ICD-10-CM | POA: Diagnosis not present

## 2017-09-27 DIAGNOSIS — M109 Gout, unspecified: Secondary | ICD-10-CM | POA: Diagnosis not present

## 2017-09-27 DIAGNOSIS — M2450 Contracture, unspecified joint: Secondary | ICD-10-CM | POA: Diagnosis not present

## 2017-09-27 DIAGNOSIS — N183 Chronic kidney disease, stage 3 (moderate): Secondary | ICD-10-CM | POA: Diagnosis not present

## 2017-09-27 DIAGNOSIS — E785 Hyperlipidemia, unspecified: Secondary | ICD-10-CM | POA: Diagnosis not present

## 2017-09-27 DIAGNOSIS — G309 Alzheimer's disease, unspecified: Secondary | ICD-10-CM | POA: Diagnosis not present

## 2017-09-27 DIAGNOSIS — I10 Essential (primary) hypertension: Secondary | ICD-10-CM | POA: Diagnosis not present

## 2017-09-27 DIAGNOSIS — R1310 Dysphagia, unspecified: Secondary | ICD-10-CM | POA: Diagnosis not present

## 2017-09-28 DIAGNOSIS — L8990 Pressure ulcer of unspecified site, unspecified stage: Secondary | ICD-10-CM | POA: Diagnosis not present

## 2017-09-28 DIAGNOSIS — M109 Gout, unspecified: Secondary | ICD-10-CM | POA: Diagnosis not present

## 2017-09-28 DIAGNOSIS — J309 Allergic rhinitis, unspecified: Secondary | ICD-10-CM | POA: Diagnosis not present

## 2017-09-28 DIAGNOSIS — N183 Chronic kidney disease, stage 3 (moderate): Secondary | ICD-10-CM | POA: Diagnosis not present

## 2017-09-28 DIAGNOSIS — R1310 Dysphagia, unspecified: Secondary | ICD-10-CM | POA: Diagnosis not present

## 2017-09-28 DIAGNOSIS — G309 Alzheimer's disease, unspecified: Secondary | ICD-10-CM | POA: Diagnosis not present

## 2017-09-28 DIAGNOSIS — I679 Cerebrovascular disease, unspecified: Secondary | ICD-10-CM | POA: Diagnosis not present

## 2017-09-28 DIAGNOSIS — M2450 Contracture, unspecified joint: Secondary | ICD-10-CM | POA: Diagnosis not present

## 2017-09-28 DIAGNOSIS — E785 Hyperlipidemia, unspecified: Secondary | ICD-10-CM | POA: Diagnosis not present

## 2017-09-28 DIAGNOSIS — I10 Essential (primary) hypertension: Secondary | ICD-10-CM | POA: Diagnosis not present

## 2017-09-29 DIAGNOSIS — E785 Hyperlipidemia, unspecified: Secondary | ICD-10-CM | POA: Diagnosis not present

## 2017-09-29 DIAGNOSIS — I10 Essential (primary) hypertension: Secondary | ICD-10-CM | POA: Diagnosis not present

## 2017-09-29 DIAGNOSIS — R1310 Dysphagia, unspecified: Secondary | ICD-10-CM | POA: Diagnosis not present

## 2017-09-29 DIAGNOSIS — J309 Allergic rhinitis, unspecified: Secondary | ICD-10-CM | POA: Diagnosis not present

## 2017-09-29 DIAGNOSIS — I679 Cerebrovascular disease, unspecified: Secondary | ICD-10-CM | POA: Diagnosis not present

## 2017-09-29 DIAGNOSIS — N183 Chronic kidney disease, stage 3 (moderate): Secondary | ICD-10-CM | POA: Diagnosis not present

## 2017-09-29 DIAGNOSIS — G309 Alzheimer's disease, unspecified: Secondary | ICD-10-CM | POA: Diagnosis not present

## 2017-09-29 DIAGNOSIS — M109 Gout, unspecified: Secondary | ICD-10-CM | POA: Diagnosis not present

## 2017-09-29 DIAGNOSIS — L8990 Pressure ulcer of unspecified site, unspecified stage: Secondary | ICD-10-CM | POA: Diagnosis not present

## 2017-09-29 DIAGNOSIS — M2450 Contracture, unspecified joint: Secondary | ICD-10-CM | POA: Diagnosis not present

## 2017-09-30 DIAGNOSIS — N183 Chronic kidney disease, stage 3 (moderate): Secondary | ICD-10-CM | POA: Diagnosis not present

## 2017-09-30 DIAGNOSIS — I10 Essential (primary) hypertension: Secondary | ICD-10-CM | POA: Diagnosis not present

## 2017-09-30 DIAGNOSIS — R1310 Dysphagia, unspecified: Secondary | ICD-10-CM | POA: Diagnosis not present

## 2017-09-30 DIAGNOSIS — M109 Gout, unspecified: Secondary | ICD-10-CM | POA: Diagnosis not present

## 2017-09-30 DIAGNOSIS — J309 Allergic rhinitis, unspecified: Secondary | ICD-10-CM | POA: Diagnosis not present

## 2017-09-30 DIAGNOSIS — G309 Alzheimer's disease, unspecified: Secondary | ICD-10-CM | POA: Diagnosis not present

## 2017-09-30 DIAGNOSIS — I679 Cerebrovascular disease, unspecified: Secondary | ICD-10-CM | POA: Diagnosis not present

## 2017-09-30 DIAGNOSIS — L8990 Pressure ulcer of unspecified site, unspecified stage: Secondary | ICD-10-CM | POA: Diagnosis not present

## 2017-09-30 DIAGNOSIS — M2450 Contracture, unspecified joint: Secondary | ICD-10-CM | POA: Diagnosis not present

## 2017-09-30 DIAGNOSIS — E785 Hyperlipidemia, unspecified: Secondary | ICD-10-CM | POA: Diagnosis not present

## 2017-10-01 DIAGNOSIS — G309 Alzheimer's disease, unspecified: Secondary | ICD-10-CM | POA: Diagnosis not present

## 2017-10-01 DIAGNOSIS — I10 Essential (primary) hypertension: Secondary | ICD-10-CM | POA: Diagnosis not present

## 2017-10-01 DIAGNOSIS — M109 Gout, unspecified: Secondary | ICD-10-CM | POA: Diagnosis not present

## 2017-10-01 DIAGNOSIS — I679 Cerebrovascular disease, unspecified: Secondary | ICD-10-CM | POA: Diagnosis not present

## 2017-10-01 DIAGNOSIS — M2450 Contracture, unspecified joint: Secondary | ICD-10-CM | POA: Diagnosis not present

## 2017-10-01 DIAGNOSIS — J309 Allergic rhinitis, unspecified: Secondary | ICD-10-CM | POA: Diagnosis not present

## 2017-10-01 DIAGNOSIS — L8990 Pressure ulcer of unspecified site, unspecified stage: Secondary | ICD-10-CM | POA: Diagnosis not present

## 2017-10-01 DIAGNOSIS — N183 Chronic kidney disease, stage 3 (moderate): Secondary | ICD-10-CM | POA: Diagnosis not present

## 2017-10-01 DIAGNOSIS — E785 Hyperlipidemia, unspecified: Secondary | ICD-10-CM | POA: Diagnosis not present

## 2017-10-01 DIAGNOSIS — R1310 Dysphagia, unspecified: Secondary | ICD-10-CM | POA: Diagnosis not present

## 2017-10-02 DIAGNOSIS — M109 Gout, unspecified: Secondary | ICD-10-CM | POA: Diagnosis not present

## 2017-10-02 DIAGNOSIS — E785 Hyperlipidemia, unspecified: Secondary | ICD-10-CM | POA: Diagnosis not present

## 2017-10-02 DIAGNOSIS — R1310 Dysphagia, unspecified: Secondary | ICD-10-CM | POA: Diagnosis not present

## 2017-10-02 DIAGNOSIS — M2450 Contracture, unspecified joint: Secondary | ICD-10-CM | POA: Diagnosis not present

## 2017-10-02 DIAGNOSIS — L8990 Pressure ulcer of unspecified site, unspecified stage: Secondary | ICD-10-CM | POA: Diagnosis not present

## 2017-10-02 DIAGNOSIS — I10 Essential (primary) hypertension: Secondary | ICD-10-CM | POA: Diagnosis not present

## 2017-10-02 DIAGNOSIS — J309 Allergic rhinitis, unspecified: Secondary | ICD-10-CM | POA: Diagnosis not present

## 2017-10-02 DIAGNOSIS — I679 Cerebrovascular disease, unspecified: Secondary | ICD-10-CM | POA: Diagnosis not present

## 2017-10-02 DIAGNOSIS — N183 Chronic kidney disease, stage 3 (moderate): Secondary | ICD-10-CM | POA: Diagnosis not present

## 2017-10-02 DIAGNOSIS — G309 Alzheimer's disease, unspecified: Secondary | ICD-10-CM | POA: Diagnosis not present

## 2017-10-03 DIAGNOSIS — G309 Alzheimer's disease, unspecified: Secondary | ICD-10-CM | POA: Diagnosis not present

## 2017-10-03 DIAGNOSIS — J309 Allergic rhinitis, unspecified: Secondary | ICD-10-CM | POA: Diagnosis not present

## 2017-10-03 DIAGNOSIS — I679 Cerebrovascular disease, unspecified: Secondary | ICD-10-CM | POA: Diagnosis not present

## 2017-10-03 DIAGNOSIS — E785 Hyperlipidemia, unspecified: Secondary | ICD-10-CM | POA: Diagnosis not present

## 2017-10-03 DIAGNOSIS — N183 Chronic kidney disease, stage 3 (moderate): Secondary | ICD-10-CM | POA: Diagnosis not present

## 2017-10-03 DIAGNOSIS — R1310 Dysphagia, unspecified: Secondary | ICD-10-CM | POA: Diagnosis not present

## 2017-10-03 DIAGNOSIS — M2450 Contracture, unspecified joint: Secondary | ICD-10-CM | POA: Diagnosis not present

## 2017-10-03 DIAGNOSIS — M109 Gout, unspecified: Secondary | ICD-10-CM | POA: Diagnosis not present

## 2017-10-03 DIAGNOSIS — L8990 Pressure ulcer of unspecified site, unspecified stage: Secondary | ICD-10-CM | POA: Diagnosis not present

## 2017-10-03 DIAGNOSIS — I10 Essential (primary) hypertension: Secondary | ICD-10-CM | POA: Diagnosis not present

## 2017-10-04 DIAGNOSIS — L8990 Pressure ulcer of unspecified site, unspecified stage: Secondary | ICD-10-CM | POA: Diagnosis not present

## 2017-10-04 DIAGNOSIS — J309 Allergic rhinitis, unspecified: Secondary | ICD-10-CM | POA: Diagnosis not present

## 2017-10-04 DIAGNOSIS — R1310 Dysphagia, unspecified: Secondary | ICD-10-CM | POA: Diagnosis not present

## 2017-10-04 DIAGNOSIS — M2450 Contracture, unspecified joint: Secondary | ICD-10-CM | POA: Diagnosis not present

## 2017-10-04 DIAGNOSIS — M109 Gout, unspecified: Secondary | ICD-10-CM | POA: Diagnosis not present

## 2017-10-04 DIAGNOSIS — N183 Chronic kidney disease, stage 3 (moderate): Secondary | ICD-10-CM | POA: Diagnosis not present

## 2017-10-04 DIAGNOSIS — E785 Hyperlipidemia, unspecified: Secondary | ICD-10-CM | POA: Diagnosis not present

## 2017-10-04 DIAGNOSIS — I679 Cerebrovascular disease, unspecified: Secondary | ICD-10-CM | POA: Diagnosis not present

## 2017-10-04 DIAGNOSIS — I10 Essential (primary) hypertension: Secondary | ICD-10-CM | POA: Diagnosis not present

## 2017-10-04 DIAGNOSIS — G309 Alzheimer's disease, unspecified: Secondary | ICD-10-CM | POA: Diagnosis not present

## 2017-10-05 DIAGNOSIS — M109 Gout, unspecified: Secondary | ICD-10-CM | POA: Diagnosis not present

## 2017-10-05 DIAGNOSIS — G309 Alzheimer's disease, unspecified: Secondary | ICD-10-CM | POA: Diagnosis not present

## 2017-10-05 DIAGNOSIS — J309 Allergic rhinitis, unspecified: Secondary | ICD-10-CM | POA: Diagnosis not present

## 2017-10-05 DIAGNOSIS — I10 Essential (primary) hypertension: Secondary | ICD-10-CM | POA: Diagnosis not present

## 2017-10-05 DIAGNOSIS — I679 Cerebrovascular disease, unspecified: Secondary | ICD-10-CM | POA: Diagnosis not present

## 2017-10-05 DIAGNOSIS — M2450 Contracture, unspecified joint: Secondary | ICD-10-CM | POA: Diagnosis not present

## 2017-10-05 DIAGNOSIS — E785 Hyperlipidemia, unspecified: Secondary | ICD-10-CM | POA: Diagnosis not present

## 2017-10-05 DIAGNOSIS — N183 Chronic kidney disease, stage 3 (moderate): Secondary | ICD-10-CM | POA: Diagnosis not present

## 2017-10-05 DIAGNOSIS — R1310 Dysphagia, unspecified: Secondary | ICD-10-CM | POA: Diagnosis not present

## 2017-10-05 DIAGNOSIS — L8990 Pressure ulcer of unspecified site, unspecified stage: Secondary | ICD-10-CM | POA: Diagnosis not present

## 2017-10-06 DIAGNOSIS — M109 Gout, unspecified: Secondary | ICD-10-CM | POA: Diagnosis not present

## 2017-10-06 DIAGNOSIS — N183 Chronic kidney disease, stage 3 (moderate): Secondary | ICD-10-CM | POA: Diagnosis not present

## 2017-10-06 DIAGNOSIS — J309 Allergic rhinitis, unspecified: Secondary | ICD-10-CM | POA: Diagnosis not present

## 2017-10-06 DIAGNOSIS — M2450 Contracture, unspecified joint: Secondary | ICD-10-CM | POA: Diagnosis not present

## 2017-10-06 DIAGNOSIS — G309 Alzheimer's disease, unspecified: Secondary | ICD-10-CM | POA: Diagnosis not present

## 2017-10-06 DIAGNOSIS — R1310 Dysphagia, unspecified: Secondary | ICD-10-CM | POA: Diagnosis not present

## 2017-10-06 DIAGNOSIS — L8990 Pressure ulcer of unspecified site, unspecified stage: Secondary | ICD-10-CM | POA: Diagnosis not present

## 2017-10-06 DIAGNOSIS — E785 Hyperlipidemia, unspecified: Secondary | ICD-10-CM | POA: Diagnosis not present

## 2017-10-06 DIAGNOSIS — I10 Essential (primary) hypertension: Secondary | ICD-10-CM | POA: Diagnosis not present

## 2017-10-06 DIAGNOSIS — I679 Cerebrovascular disease, unspecified: Secondary | ICD-10-CM | POA: Diagnosis not present

## 2017-10-07 DIAGNOSIS — M109 Gout, unspecified: Secondary | ICD-10-CM | POA: Diagnosis not present

## 2017-10-07 DIAGNOSIS — L8990 Pressure ulcer of unspecified site, unspecified stage: Secondary | ICD-10-CM | POA: Diagnosis not present

## 2017-10-07 DIAGNOSIS — M2450 Contracture, unspecified joint: Secondary | ICD-10-CM | POA: Diagnosis not present

## 2017-10-07 DIAGNOSIS — I10 Essential (primary) hypertension: Secondary | ICD-10-CM | POA: Diagnosis not present

## 2017-10-07 DIAGNOSIS — N183 Chronic kidney disease, stage 3 (moderate): Secondary | ICD-10-CM | POA: Diagnosis not present

## 2017-10-07 DIAGNOSIS — E785 Hyperlipidemia, unspecified: Secondary | ICD-10-CM | POA: Diagnosis not present

## 2017-10-07 DIAGNOSIS — J309 Allergic rhinitis, unspecified: Secondary | ICD-10-CM | POA: Diagnosis not present

## 2017-10-07 DIAGNOSIS — R1310 Dysphagia, unspecified: Secondary | ICD-10-CM | POA: Diagnosis not present

## 2017-10-07 DIAGNOSIS — I679 Cerebrovascular disease, unspecified: Secondary | ICD-10-CM | POA: Diagnosis not present

## 2017-10-07 DIAGNOSIS — G309 Alzheimer's disease, unspecified: Secondary | ICD-10-CM | POA: Diagnosis not present

## 2017-10-08 DIAGNOSIS — N183 Chronic kidney disease, stage 3 (moderate): Secondary | ICD-10-CM | POA: Diagnosis not present

## 2017-10-08 DIAGNOSIS — L8990 Pressure ulcer of unspecified site, unspecified stage: Secondary | ICD-10-CM | POA: Diagnosis not present

## 2017-10-08 DIAGNOSIS — E785 Hyperlipidemia, unspecified: Secondary | ICD-10-CM | POA: Diagnosis not present

## 2017-10-08 DIAGNOSIS — M2450 Contracture, unspecified joint: Secondary | ICD-10-CM | POA: Diagnosis not present

## 2017-10-08 DIAGNOSIS — M109 Gout, unspecified: Secondary | ICD-10-CM | POA: Diagnosis not present

## 2017-10-08 DIAGNOSIS — I679 Cerebrovascular disease, unspecified: Secondary | ICD-10-CM | POA: Diagnosis not present

## 2017-10-08 DIAGNOSIS — R1310 Dysphagia, unspecified: Secondary | ICD-10-CM | POA: Diagnosis not present

## 2017-10-08 DIAGNOSIS — G309 Alzheimer's disease, unspecified: Secondary | ICD-10-CM | POA: Diagnosis not present

## 2017-10-08 DIAGNOSIS — J309 Allergic rhinitis, unspecified: Secondary | ICD-10-CM | POA: Diagnosis not present

## 2017-10-08 DIAGNOSIS — I10 Essential (primary) hypertension: Secondary | ICD-10-CM | POA: Diagnosis not present

## 2017-10-09 DIAGNOSIS — J309 Allergic rhinitis, unspecified: Secondary | ICD-10-CM | POA: Diagnosis not present

## 2017-10-09 DIAGNOSIS — I679 Cerebrovascular disease, unspecified: Secondary | ICD-10-CM | POA: Diagnosis not present

## 2017-10-09 DIAGNOSIS — N183 Chronic kidney disease, stage 3 (moderate): Secondary | ICD-10-CM | POA: Diagnosis not present

## 2017-10-09 DIAGNOSIS — M2450 Contracture, unspecified joint: Secondary | ICD-10-CM | POA: Diagnosis not present

## 2017-10-09 DIAGNOSIS — R1310 Dysphagia, unspecified: Secondary | ICD-10-CM | POA: Diagnosis not present

## 2017-10-09 DIAGNOSIS — E785 Hyperlipidemia, unspecified: Secondary | ICD-10-CM | POA: Diagnosis not present

## 2017-10-09 DIAGNOSIS — L8990 Pressure ulcer of unspecified site, unspecified stage: Secondary | ICD-10-CM | POA: Diagnosis not present

## 2017-10-09 DIAGNOSIS — G309 Alzheimer's disease, unspecified: Secondary | ICD-10-CM | POA: Diagnosis not present

## 2017-10-09 DIAGNOSIS — I10 Essential (primary) hypertension: Secondary | ICD-10-CM | POA: Diagnosis not present

## 2017-10-09 DIAGNOSIS — M109 Gout, unspecified: Secondary | ICD-10-CM | POA: Diagnosis not present

## 2017-10-10 DIAGNOSIS — I679 Cerebrovascular disease, unspecified: Secondary | ICD-10-CM | POA: Diagnosis not present

## 2017-10-10 DIAGNOSIS — J309 Allergic rhinitis, unspecified: Secondary | ICD-10-CM | POA: Diagnosis not present

## 2017-10-10 DIAGNOSIS — M109 Gout, unspecified: Secondary | ICD-10-CM | POA: Diagnosis not present

## 2017-10-10 DIAGNOSIS — G309 Alzheimer's disease, unspecified: Secondary | ICD-10-CM | POA: Diagnosis not present

## 2017-10-10 DIAGNOSIS — M2450 Contracture, unspecified joint: Secondary | ICD-10-CM | POA: Diagnosis not present

## 2017-10-10 DIAGNOSIS — L8990 Pressure ulcer of unspecified site, unspecified stage: Secondary | ICD-10-CM | POA: Diagnosis not present

## 2017-10-10 DIAGNOSIS — I10 Essential (primary) hypertension: Secondary | ICD-10-CM | POA: Diagnosis not present

## 2017-10-10 DIAGNOSIS — R1310 Dysphagia, unspecified: Secondary | ICD-10-CM | POA: Diagnosis not present

## 2017-10-10 DIAGNOSIS — E785 Hyperlipidemia, unspecified: Secondary | ICD-10-CM | POA: Diagnosis not present

## 2017-10-10 DIAGNOSIS — N183 Chronic kidney disease, stage 3 (moderate): Secondary | ICD-10-CM | POA: Diagnosis not present

## 2017-10-11 DIAGNOSIS — I679 Cerebrovascular disease, unspecified: Secondary | ICD-10-CM | POA: Diagnosis not present

## 2017-10-11 DIAGNOSIS — J309 Allergic rhinitis, unspecified: Secondary | ICD-10-CM | POA: Diagnosis not present

## 2017-10-11 DIAGNOSIS — L8990 Pressure ulcer of unspecified site, unspecified stage: Secondary | ICD-10-CM | POA: Diagnosis not present

## 2017-10-11 DIAGNOSIS — N183 Chronic kidney disease, stage 3 (moderate): Secondary | ICD-10-CM | POA: Diagnosis not present

## 2017-10-11 DIAGNOSIS — E785 Hyperlipidemia, unspecified: Secondary | ICD-10-CM | POA: Diagnosis not present

## 2017-10-11 DIAGNOSIS — M109 Gout, unspecified: Secondary | ICD-10-CM | POA: Diagnosis not present

## 2017-10-11 DIAGNOSIS — M2450 Contracture, unspecified joint: Secondary | ICD-10-CM | POA: Diagnosis not present

## 2017-10-11 DIAGNOSIS — R1310 Dysphagia, unspecified: Secondary | ICD-10-CM | POA: Diagnosis not present

## 2017-10-11 DIAGNOSIS — G309 Alzheimer's disease, unspecified: Secondary | ICD-10-CM | POA: Diagnosis not present

## 2017-10-11 DIAGNOSIS — I10 Essential (primary) hypertension: Secondary | ICD-10-CM | POA: Diagnosis not present

## 2017-10-12 DIAGNOSIS — L8990 Pressure ulcer of unspecified site, unspecified stage: Secondary | ICD-10-CM | POA: Diagnosis not present

## 2017-10-12 DIAGNOSIS — M2450 Contracture, unspecified joint: Secondary | ICD-10-CM | POA: Diagnosis not present

## 2017-10-12 DIAGNOSIS — R1310 Dysphagia, unspecified: Secondary | ICD-10-CM | POA: Diagnosis not present

## 2017-10-12 DIAGNOSIS — J309 Allergic rhinitis, unspecified: Secondary | ICD-10-CM | POA: Diagnosis not present

## 2017-10-12 DIAGNOSIS — M109 Gout, unspecified: Secondary | ICD-10-CM | POA: Diagnosis not present

## 2017-10-12 DIAGNOSIS — G309 Alzheimer's disease, unspecified: Secondary | ICD-10-CM | POA: Diagnosis not present

## 2017-10-12 DIAGNOSIS — I679 Cerebrovascular disease, unspecified: Secondary | ICD-10-CM | POA: Diagnosis not present

## 2017-10-12 DIAGNOSIS — E785 Hyperlipidemia, unspecified: Secondary | ICD-10-CM | POA: Diagnosis not present

## 2017-10-12 DIAGNOSIS — I10 Essential (primary) hypertension: Secondary | ICD-10-CM | POA: Diagnosis not present

## 2017-10-12 DIAGNOSIS — N183 Chronic kidney disease, stage 3 (moderate): Secondary | ICD-10-CM | POA: Diagnosis not present

## 2017-10-13 DIAGNOSIS — I679 Cerebrovascular disease, unspecified: Secondary | ICD-10-CM | POA: Diagnosis not present

## 2017-10-13 DIAGNOSIS — J309 Allergic rhinitis, unspecified: Secondary | ICD-10-CM | POA: Diagnosis not present

## 2017-10-13 DIAGNOSIS — N183 Chronic kidney disease, stage 3 (moderate): Secondary | ICD-10-CM | POA: Diagnosis not present

## 2017-10-13 DIAGNOSIS — M109 Gout, unspecified: Secondary | ICD-10-CM | POA: Diagnosis not present

## 2017-10-13 DIAGNOSIS — L8990 Pressure ulcer of unspecified site, unspecified stage: Secondary | ICD-10-CM | POA: Diagnosis not present

## 2017-10-13 DIAGNOSIS — M2450 Contracture, unspecified joint: Secondary | ICD-10-CM | POA: Diagnosis not present

## 2017-10-13 DIAGNOSIS — I10 Essential (primary) hypertension: Secondary | ICD-10-CM | POA: Diagnosis not present

## 2017-10-13 DIAGNOSIS — E785 Hyperlipidemia, unspecified: Secondary | ICD-10-CM | POA: Diagnosis not present

## 2017-10-13 DIAGNOSIS — G309 Alzheimer's disease, unspecified: Secondary | ICD-10-CM | POA: Diagnosis not present

## 2017-10-13 DIAGNOSIS — R1310 Dysphagia, unspecified: Secondary | ICD-10-CM | POA: Diagnosis not present

## 2017-10-14 DIAGNOSIS — I679 Cerebrovascular disease, unspecified: Secondary | ICD-10-CM | POA: Diagnosis not present

## 2017-10-14 DIAGNOSIS — N183 Chronic kidney disease, stage 3 (moderate): Secondary | ICD-10-CM | POA: Diagnosis not present

## 2017-10-14 DIAGNOSIS — M109 Gout, unspecified: Secondary | ICD-10-CM | POA: Diagnosis not present

## 2017-10-14 DIAGNOSIS — E785 Hyperlipidemia, unspecified: Secondary | ICD-10-CM | POA: Diagnosis not present

## 2017-10-14 DIAGNOSIS — J309 Allergic rhinitis, unspecified: Secondary | ICD-10-CM | POA: Diagnosis not present

## 2017-10-14 DIAGNOSIS — G309 Alzheimer's disease, unspecified: Secondary | ICD-10-CM | POA: Diagnosis not present

## 2017-10-14 DIAGNOSIS — R1310 Dysphagia, unspecified: Secondary | ICD-10-CM | POA: Diagnosis not present

## 2017-10-14 DIAGNOSIS — L8990 Pressure ulcer of unspecified site, unspecified stage: Secondary | ICD-10-CM | POA: Diagnosis not present

## 2017-10-14 DIAGNOSIS — I10 Essential (primary) hypertension: Secondary | ICD-10-CM | POA: Diagnosis not present

## 2017-10-14 DIAGNOSIS — M2450 Contracture, unspecified joint: Secondary | ICD-10-CM | POA: Diagnosis not present

## 2017-10-15 ENCOUNTER — Non-Acute Institutional Stay (SKILLED_NURSING_FACILITY): Payer: Medicare Other | Admitting: Adult Health

## 2017-10-15 ENCOUNTER — Encounter: Payer: Self-pay | Admitting: Adult Health

## 2017-10-15 DIAGNOSIS — R262 Difficulty in walking, not elsewhere classified: Secondary | ICD-10-CM | POA: Diagnosis not present

## 2017-10-15 DIAGNOSIS — I10 Essential (primary) hypertension: Secondary | ICD-10-CM | POA: Diagnosis not present

## 2017-10-15 DIAGNOSIS — M109 Gout, unspecified: Secondary | ICD-10-CM | POA: Diagnosis not present

## 2017-10-15 DIAGNOSIS — L89150 Pressure ulcer of sacral region, unstageable: Secondary | ICD-10-CM | POA: Diagnosis not present

## 2017-10-15 DIAGNOSIS — B351 Tinea unguium: Secondary | ICD-10-CM | POA: Diagnosis not present

## 2017-10-15 DIAGNOSIS — E785 Hyperlipidemia, unspecified: Secondary | ICD-10-CM | POA: Diagnosis not present

## 2017-10-15 DIAGNOSIS — M2450 Contracture, unspecified joint: Secondary | ICD-10-CM | POA: Diagnosis not present

## 2017-10-15 DIAGNOSIS — Z8673 Personal history of transient ischemic attack (TIA), and cerebral infarction without residual deficits: Secondary | ICD-10-CM

## 2017-10-15 DIAGNOSIS — R630 Anorexia: Secondary | ICD-10-CM

## 2017-10-15 DIAGNOSIS — N183 Chronic kidney disease, stage 3 (moderate): Secondary | ICD-10-CM | POA: Diagnosis not present

## 2017-10-15 DIAGNOSIS — G309 Alzheimer's disease, unspecified: Secondary | ICD-10-CM

## 2017-10-15 DIAGNOSIS — L8990 Pressure ulcer of unspecified site, unspecified stage: Secondary | ICD-10-CM | POA: Diagnosis not present

## 2017-10-15 DIAGNOSIS — R1310 Dysphagia, unspecified: Secondary | ICD-10-CM | POA: Diagnosis not present

## 2017-10-15 DIAGNOSIS — F0280 Dementia in other diseases classified elsewhere without behavioral disturbance: Secondary | ICD-10-CM

## 2017-10-15 DIAGNOSIS — J309 Allergic rhinitis, unspecified: Secondary | ICD-10-CM | POA: Diagnosis not present

## 2017-10-15 DIAGNOSIS — I679 Cerebrovascular disease, unspecified: Secondary | ICD-10-CM | POA: Diagnosis not present

## 2017-10-15 DIAGNOSIS — I739 Peripheral vascular disease, unspecified: Secondary | ICD-10-CM | POA: Diagnosis not present

## 2017-10-15 LAB — HM DIABETES FOOT EXAM

## 2017-10-15 NOTE — Progress Notes (Signed)
Location:  [facility] Room Number: 117-A Place of Service:  SNF (31) Provider:  Kenard GowerMedina-Vargas, Chrisoula Zegarra, NP  Patient Care Team: Pecola LawlessHopper, William F, MD as PCP - General (Internal Medicine) Gillis SantaMedina-Vargas, Judee Hennick C, NP as Nurse Practitioner (Internal Medicine)  Extended Emergency Contact Information Primary Emergency Contact: Lenhardt,[name] Address: [address]          [place], [place] [address] [place] Home Phone: [phone] Mobile Phone: [phone] Relation: Spouse Secondary Emergency Contact: Romeo AppleWilliamson,[name] J  [place] Mobile Phone: [phone] Relation: Son  Code Status:  DNR  Goals of care: Advanced Directive information Advanced Directives 09/20/2017  Does Patient Have a Medical Advance Directive? Yes  Type of Advance Directive Out of facility DNR (pink MOST or yellow form)  Does patient want to make changes to medical advance directive? No - Patient declined  Copy of Healthcare Power of Attorney in Chart? -  Would patient like information on creating a medical advance directive? -  Pre-existing out of facility DNR order (yellow form or pink MOST form) -     Chief Complaint  Patient presents with  . Medical Management of Chronic Issues    Routine [facility] visit    HPI:  Pt is a 72 y.o. female seen today for medical management of chronic diseases. She is a long-[facility] care [facility] of [facility].  She has a PMH of CKD, history ov CVA in 2013, HTN, HLD, chronic constipation, Alzheimer's disease, and is nonverbal and nonambulatory at baseline. She was seen in the room today with husband feeding her breakfast. Resident had her eyes closed but was chewing.     Past Medical History:  Diagnosis Date  . Dementia    "alzheimer's" (05/18/2012)  . Fall 05/17/2012  . Fracture of medial wall of orbit (HCC) 05/17/2012  . Gout    "? feet" (05/18/2012); uric acid 9.4 on 08/06/16 with R wrist  pain; 7.1 on 08/15/16 on Allopurinol  . Hypertension   . Incontinence of urine   . Stroke [facility](HCC) ~ 2003   "slight memory loss" (05/18/2012)  . Wrist fracture, bilateral 05/17/2012   "fell down steps" (05/18/2012)   Past Surgical History:  Procedure Laterality Date  . fractured tooth     01/06/17 Dr Ocie DoyneScott Jensen DMD extracted tooth  #4   . VAGINAL HYSTERECTOMY      Allergies  Allergen Reactions  . Pollen Extract Other (See Comments)    rhinitis    Outpatient Encounter Medications as of 10/15/2017  Medication Sig  . acetaminophen (TYLENOL) 325 MG tablet Take 650 mg by mouth every 6 (six) hours as needed for moderate pain or fever.  Marland Kitchen. CALCIUM-VITAMIN D PO Take 500 mg by mouth daily.  . Chloroxylenol-Zinc Oxide (BAZA EX) Apply 1 application topically daily. Apply to sacral fold  . cloNIDine (CATAPRES) 0.1 MG tablet Take 0.1 mg by mouth 2 (two) times daily.   Marland Kitchen. labetalol (NORMODYNE) 300 MG tablet Take 300 mg by mouth 2 (two) times daily.  . Multiple Vitamins-Minerals (ELDERTONIC PO) Take 15 mLs by mouth 2 (two) times daily.  . Nutritional Supplements (NUTRITIONAL SUPPLEMENT PO) Take 1 each by mouth 2 (two) times daily. Magic Cup  . polyethylene glycol (MIRALAX / GLYCOLAX) packet Take 17 g by mouth daily as needed.   . senna-docusate (SENOKOT-S) 8.6-50 MG tablet Take 1 tablet by mouth 2 (two) times daily.   No facility-administered encounter medications on file as of 10/15/2017.     Review  of Systems  Unable to obtain due to being non-verbal    Immunization History  Administered Date(s) Administered  . Influenza-Unspecified 05/10/2013, 05/22/2015, 05/14/2016, 05/23/2017  . Pneumococcal-Unspecified 06/03/2012, 05/14/2016   Pertinent  Health Maintenance Due  Topic Date Due  . PNA vac Low Risk Adult (2 of 2 - PCV13) 07/21/2018 (Originally 05/14/2017)  . MAMMOGRAM  03/09/2024 (Originally 11/26/2014)  . COLONOSCOPY  03/09/2024 (Originally 09/25/1993)  . INFLUENZA VACCINE  Completed  . DEXA  SCAN  Discontinued   Fall Risk  04/06/2017 09/23/2016 09/11/2016 06/10/2016 04/10/2016  Falls in the past year? Yes No No No Exclusion - non ambulatory  Number falls in past yr: 1 - - - -  Injury with Fall? Yes - - - -      Vitals:   10/15/17 1025  BP: 101/68  Pulse: 68  Resp: 16  Temp: 98.3 F (36.8 C)  TempSrc: Oral  SpO2: 96%  Weight: 117 lb 12.8 oz (53.4 kg)  Height: 5\' 1"  (1.549 m)   Body mass index is 22.26 kg/m.  Physical Exam  GENERAL APPEARANCE: Well nourished. In no acute distress.  SKIN:  Has sacral wound measuring 5.6 X 1 cm with 60% pale red, 40% slough, small amount serous drainage MOUTH and THROAT: Lips are without lesions.  RESPIRATORY: Breathing is even & unlabored, BS CTAB CARDIAC: RRR, no murmur,no extra heart sounds, no edema GI: Abdomen soft, normal BS, no masses, no tenderness EXTREMITIES:  BUE are contracted, does not move extremities NEUROLOGICAL: non-verbal PSYCHIATRIC:  Affect and behavior are appropriate   Labs reviewed: Recent Labs    08/11/17 1349  08/13/17 0431 08/14/17 0214  08/16/17 0242 08/17/17 0309 08/18/17 0223  NA 156*   < > 150* 148*   < > 143 143 142  K 3.5   < > 3.1* 3.5   < > 3.2* 4.2 3.6  CL 128*   < > 123* 120*   < > 114* 114* 113*  CO2 20*   < > 18* 18*   < > 17* 14* 21*  GLUCOSE 97   < > 135* 115*   < > 123* 102* 120*  BUN 40*   < > 19 9   < > 15 17 20   CREATININE 1.95*   < > 1.51* 1.33*   < > 1.54* 1.48* 1.54*  CALCIUM 9.2   < > 8.5* 8.8*   < > 8.7* 8.7* 8.7*  MG 2.5*  --  2.0 1.8  --   --   --   --   PHOS 3.5  --   --   --   --   --   --   --    < > = values in this interval not displayed.   Recent Labs    08/11/17 0736 08/12/17 0249  AST 18 16  ALT 15 12*  ALKPHOS 77 71  BILITOT 0.6 1.0  PROT 8.3* 7.1  ALBUMIN 3.7 3.2*   Recent Labs    08/11/17 0736  08/14/17 2210  08/16/17 0242 08/17/17 0309 08/18/17 0223  WBC 12.3*   < > 11.0*   < > 15.9* 19.4* 16.0*  NEUTROABS 9.5*  --  8.5*  --   --   --   --     HGB 13.6   < > 14.8   < > 13.0 13.5 10.5*  HCT 44.3   < > 44.4   < > 40.0 39.4 32.9*  MCV 98.7   < > 92.1   < >  91.5 91.8 92.9  PLT 285   < > PLATELET CLUMPS NOTED ON SMEAR, COUNT APPEARS ADEQUATE   < > 326 296 341   < > = values in this interval not displayed.   Lab Results  Component Value Date   TSH 1.61 09/18/2016    Lab Results  Component Value Date   CHOL 126 12/17/2016   HDL 35 12/17/2016   LDLCALC 69 12/17/2016   TRIG 109 12/17/2016    Assessment/Plan  1. History of stroke - currently followed-up by hospice, continue Clonidine 0.1 mg 1 tab BID and Labetalol 300 mg 1 tab BID, supportive care, turn to sides, fall precautions   2. Essential hypertension, benign - BPs noted to be inconsistent, will check BP/HR BID X 1 week, continue  Clonidine 0.1 mg 1 tab BID and Labetalol 300 mg 1 tab BID   3. Pressure injury of sacral region, unstageable (HCC) - she is being followed-up by wound consultant, treatment done by facility treatment nurse, keep skin clean and dry, air mattress over bed, turn to sides when in bed   4. Poor appetite -  Body mass index is 22.26 kg/m. current weight 117.8 lbs, stable, continue Eldertonic 15 ml BID and Magic cup BID, assist resident with feedings    5. Alzheimer's dementia without behavioral disturbance, unspecified timing of dementia onset - comfort care, continue comfort care, fall precautions     Family/ staff Communication: Discussed plan of care with husband.  Labs/tests ordered:  None  Goals of care:   Long-term care/Hospice   Kenard Gower, NP [facility] and Adult Medicine [phone] (Monday-Friday 8:00 a.m. - 5:00 p.m.) [phone] (after hours)

## 2017-10-16 DIAGNOSIS — M109 Gout, unspecified: Secondary | ICD-10-CM | POA: Diagnosis not present

## 2017-10-16 DIAGNOSIS — G309 Alzheimer's disease, unspecified: Secondary | ICD-10-CM | POA: Diagnosis not present

## 2017-10-16 DIAGNOSIS — M2450 Contracture, unspecified joint: Secondary | ICD-10-CM | POA: Diagnosis not present

## 2017-10-16 DIAGNOSIS — L8990 Pressure ulcer of unspecified site, unspecified stage: Secondary | ICD-10-CM | POA: Diagnosis not present

## 2017-10-16 DIAGNOSIS — J309 Allergic rhinitis, unspecified: Secondary | ICD-10-CM | POA: Diagnosis not present

## 2017-10-16 DIAGNOSIS — I679 Cerebrovascular disease, unspecified: Secondary | ICD-10-CM | POA: Diagnosis not present

## 2017-10-16 DIAGNOSIS — E785 Hyperlipidemia, unspecified: Secondary | ICD-10-CM | POA: Diagnosis not present

## 2017-10-16 DIAGNOSIS — I10 Essential (primary) hypertension: Secondary | ICD-10-CM | POA: Diagnosis not present

## 2017-10-16 DIAGNOSIS — R1310 Dysphagia, unspecified: Secondary | ICD-10-CM | POA: Diagnosis not present

## 2017-10-16 DIAGNOSIS — N183 Chronic kidney disease, stage 3 (moderate): Secondary | ICD-10-CM | POA: Diagnosis not present

## 2017-10-17 DIAGNOSIS — M2450 Contracture, unspecified joint: Secondary | ICD-10-CM | POA: Diagnosis not present

## 2017-10-17 DIAGNOSIS — J309 Allergic rhinitis, unspecified: Secondary | ICD-10-CM | POA: Diagnosis not present

## 2017-10-17 DIAGNOSIS — N183 Chronic kidney disease, stage 3 (moderate): Secondary | ICD-10-CM | POA: Diagnosis not present

## 2017-10-17 DIAGNOSIS — E785 Hyperlipidemia, unspecified: Secondary | ICD-10-CM | POA: Diagnosis not present

## 2017-10-17 DIAGNOSIS — G309 Alzheimer's disease, unspecified: Secondary | ICD-10-CM | POA: Diagnosis not present

## 2017-10-17 DIAGNOSIS — I10 Essential (primary) hypertension: Secondary | ICD-10-CM | POA: Diagnosis not present

## 2017-10-17 DIAGNOSIS — L8990 Pressure ulcer of unspecified site, unspecified stage: Secondary | ICD-10-CM | POA: Diagnosis not present

## 2017-10-17 DIAGNOSIS — R1310 Dysphagia, unspecified: Secondary | ICD-10-CM | POA: Diagnosis not present

## 2017-10-17 DIAGNOSIS — M109 Gout, unspecified: Secondary | ICD-10-CM | POA: Diagnosis not present

## 2017-10-17 DIAGNOSIS — I679 Cerebrovascular disease, unspecified: Secondary | ICD-10-CM | POA: Diagnosis not present

## 2017-10-18 DIAGNOSIS — M109 Gout, unspecified: Secondary | ICD-10-CM | POA: Diagnosis not present

## 2017-10-18 DIAGNOSIS — N183 Chronic kidney disease, stage 3 (moderate): Secondary | ICD-10-CM | POA: Diagnosis not present

## 2017-10-18 DIAGNOSIS — M2450 Contracture, unspecified joint: Secondary | ICD-10-CM | POA: Diagnosis not present

## 2017-10-18 DIAGNOSIS — R1310 Dysphagia, unspecified: Secondary | ICD-10-CM | POA: Diagnosis not present

## 2017-10-18 DIAGNOSIS — E785 Hyperlipidemia, unspecified: Secondary | ICD-10-CM | POA: Diagnosis not present

## 2017-10-18 DIAGNOSIS — J309 Allergic rhinitis, unspecified: Secondary | ICD-10-CM | POA: Diagnosis not present

## 2017-10-18 DIAGNOSIS — I679 Cerebrovascular disease, unspecified: Secondary | ICD-10-CM | POA: Diagnosis not present

## 2017-10-18 DIAGNOSIS — L8990 Pressure ulcer of unspecified site, unspecified stage: Secondary | ICD-10-CM | POA: Diagnosis not present

## 2017-10-18 DIAGNOSIS — I10 Essential (primary) hypertension: Secondary | ICD-10-CM | POA: Diagnosis not present

## 2017-10-18 DIAGNOSIS — G309 Alzheimer's disease, unspecified: Secondary | ICD-10-CM | POA: Diagnosis not present

## 2017-10-19 DIAGNOSIS — G309 Alzheimer's disease, unspecified: Secondary | ICD-10-CM | POA: Diagnosis not present

## 2017-10-19 DIAGNOSIS — R1310 Dysphagia, unspecified: Secondary | ICD-10-CM | POA: Diagnosis not present

## 2017-10-19 DIAGNOSIS — L8990 Pressure ulcer of unspecified site, unspecified stage: Secondary | ICD-10-CM | POA: Diagnosis not present

## 2017-10-19 DIAGNOSIS — N183 Chronic kidney disease, stage 3 (moderate): Secondary | ICD-10-CM | POA: Diagnosis not present

## 2017-10-19 DIAGNOSIS — E785 Hyperlipidemia, unspecified: Secondary | ICD-10-CM | POA: Diagnosis not present

## 2017-10-19 DIAGNOSIS — J309 Allergic rhinitis, unspecified: Secondary | ICD-10-CM | POA: Diagnosis not present

## 2017-10-19 DIAGNOSIS — M109 Gout, unspecified: Secondary | ICD-10-CM | POA: Diagnosis not present

## 2017-10-19 DIAGNOSIS — I10 Essential (primary) hypertension: Secondary | ICD-10-CM | POA: Diagnosis not present

## 2017-10-19 DIAGNOSIS — I679 Cerebrovascular disease, unspecified: Secondary | ICD-10-CM | POA: Diagnosis not present

## 2017-10-19 DIAGNOSIS — M2450 Contracture, unspecified joint: Secondary | ICD-10-CM | POA: Diagnosis not present

## 2017-10-20 DIAGNOSIS — M2450 Contracture, unspecified joint: Secondary | ICD-10-CM | POA: Diagnosis not present

## 2017-10-20 DIAGNOSIS — I679 Cerebrovascular disease, unspecified: Secondary | ICD-10-CM | POA: Diagnosis not present

## 2017-10-20 DIAGNOSIS — N183 Chronic kidney disease, stage 3 (moderate): Secondary | ICD-10-CM | POA: Diagnosis not present

## 2017-10-20 DIAGNOSIS — J309 Allergic rhinitis, unspecified: Secondary | ICD-10-CM | POA: Diagnosis not present

## 2017-10-20 DIAGNOSIS — M109 Gout, unspecified: Secondary | ICD-10-CM | POA: Diagnosis not present

## 2017-10-20 DIAGNOSIS — R1310 Dysphagia, unspecified: Secondary | ICD-10-CM | POA: Diagnosis not present

## 2017-10-20 DIAGNOSIS — I10 Essential (primary) hypertension: Secondary | ICD-10-CM | POA: Diagnosis not present

## 2017-10-20 DIAGNOSIS — G309 Alzheimer's disease, unspecified: Secondary | ICD-10-CM | POA: Diagnosis not present

## 2017-10-20 DIAGNOSIS — L8990 Pressure ulcer of unspecified site, unspecified stage: Secondary | ICD-10-CM | POA: Diagnosis not present

## 2017-10-20 DIAGNOSIS — E785 Hyperlipidemia, unspecified: Secondary | ICD-10-CM | POA: Diagnosis not present

## 2017-10-21 DIAGNOSIS — R1310 Dysphagia, unspecified: Secondary | ICD-10-CM | POA: Diagnosis not present

## 2017-10-21 DIAGNOSIS — I10 Essential (primary) hypertension: Secondary | ICD-10-CM | POA: Diagnosis not present

## 2017-10-21 DIAGNOSIS — G309 Alzheimer's disease, unspecified: Secondary | ICD-10-CM | POA: Diagnosis not present

## 2017-10-21 DIAGNOSIS — M2450 Contracture, unspecified joint: Secondary | ICD-10-CM | POA: Diagnosis not present

## 2017-10-21 DIAGNOSIS — E785 Hyperlipidemia, unspecified: Secondary | ICD-10-CM | POA: Diagnosis not present

## 2017-10-21 DIAGNOSIS — I679 Cerebrovascular disease, unspecified: Secondary | ICD-10-CM | POA: Diagnosis not present

## 2017-10-21 DIAGNOSIS — N183 Chronic kidney disease, stage 3 (moderate): Secondary | ICD-10-CM | POA: Diagnosis not present

## 2017-10-21 DIAGNOSIS — M109 Gout, unspecified: Secondary | ICD-10-CM | POA: Diagnosis not present

## 2017-10-21 DIAGNOSIS — L8990 Pressure ulcer of unspecified site, unspecified stage: Secondary | ICD-10-CM | POA: Diagnosis not present

## 2017-10-21 DIAGNOSIS — J309 Allergic rhinitis, unspecified: Secondary | ICD-10-CM | POA: Diagnosis not present

## 2017-10-22 DIAGNOSIS — I10 Essential (primary) hypertension: Secondary | ICD-10-CM | POA: Diagnosis not present

## 2017-10-22 DIAGNOSIS — R1310 Dysphagia, unspecified: Secondary | ICD-10-CM | POA: Diagnosis not present

## 2017-10-22 DIAGNOSIS — M109 Gout, unspecified: Secondary | ICD-10-CM | POA: Diagnosis not present

## 2017-10-22 DIAGNOSIS — G309 Alzheimer's disease, unspecified: Secondary | ICD-10-CM | POA: Diagnosis not present

## 2017-10-22 DIAGNOSIS — J309 Allergic rhinitis, unspecified: Secondary | ICD-10-CM | POA: Diagnosis not present

## 2017-10-22 DIAGNOSIS — E785 Hyperlipidemia, unspecified: Secondary | ICD-10-CM | POA: Diagnosis not present

## 2017-10-22 DIAGNOSIS — N183 Chronic kidney disease, stage 3 (moderate): Secondary | ICD-10-CM | POA: Diagnosis not present

## 2017-10-22 DIAGNOSIS — L8990 Pressure ulcer of unspecified site, unspecified stage: Secondary | ICD-10-CM | POA: Diagnosis not present

## 2017-10-22 DIAGNOSIS — M2450 Contracture, unspecified joint: Secondary | ICD-10-CM | POA: Diagnosis not present

## 2017-10-22 DIAGNOSIS — I679 Cerebrovascular disease, unspecified: Secondary | ICD-10-CM | POA: Diagnosis not present

## 2017-10-23 DIAGNOSIS — M109 Gout, unspecified: Secondary | ICD-10-CM | POA: Diagnosis not present

## 2017-10-23 DIAGNOSIS — I679 Cerebrovascular disease, unspecified: Secondary | ICD-10-CM | POA: Diagnosis not present

## 2017-10-23 DIAGNOSIS — M2450 Contracture, unspecified joint: Secondary | ICD-10-CM | POA: Diagnosis not present

## 2017-10-23 DIAGNOSIS — I10 Essential (primary) hypertension: Secondary | ICD-10-CM | POA: Diagnosis not present

## 2017-10-23 DIAGNOSIS — R1310 Dysphagia, unspecified: Secondary | ICD-10-CM | POA: Diagnosis not present

## 2017-10-23 DIAGNOSIS — J309 Allergic rhinitis, unspecified: Secondary | ICD-10-CM | POA: Diagnosis not present

## 2017-10-23 DIAGNOSIS — E785 Hyperlipidemia, unspecified: Secondary | ICD-10-CM | POA: Diagnosis not present

## 2017-10-23 DIAGNOSIS — L8990 Pressure ulcer of unspecified site, unspecified stage: Secondary | ICD-10-CM | POA: Diagnosis not present

## 2017-10-23 DIAGNOSIS — G309 Alzheimer's disease, unspecified: Secondary | ICD-10-CM | POA: Diagnosis not present

## 2017-10-23 DIAGNOSIS — N183 Chronic kidney disease, stage 3 (moderate): Secondary | ICD-10-CM | POA: Diagnosis not present

## 2017-10-24 DIAGNOSIS — N183 Chronic kidney disease, stage 3 (moderate): Secondary | ICD-10-CM | POA: Diagnosis not present

## 2017-10-24 DIAGNOSIS — R1310 Dysphagia, unspecified: Secondary | ICD-10-CM | POA: Diagnosis not present

## 2017-10-24 DIAGNOSIS — J309 Allergic rhinitis, unspecified: Secondary | ICD-10-CM | POA: Diagnosis not present

## 2017-10-24 DIAGNOSIS — G309 Alzheimer's disease, unspecified: Secondary | ICD-10-CM | POA: Diagnosis not present

## 2017-10-24 DIAGNOSIS — L8990 Pressure ulcer of unspecified site, unspecified stage: Secondary | ICD-10-CM | POA: Diagnosis not present

## 2017-10-24 DIAGNOSIS — I679 Cerebrovascular disease, unspecified: Secondary | ICD-10-CM | POA: Diagnosis not present

## 2017-10-24 DIAGNOSIS — I10 Essential (primary) hypertension: Secondary | ICD-10-CM | POA: Diagnosis not present

## 2017-10-24 DIAGNOSIS — E785 Hyperlipidemia, unspecified: Secondary | ICD-10-CM | POA: Diagnosis not present

## 2017-10-24 DIAGNOSIS — M2450 Contracture, unspecified joint: Secondary | ICD-10-CM | POA: Diagnosis not present

## 2017-10-24 DIAGNOSIS — M109 Gout, unspecified: Secondary | ICD-10-CM | POA: Diagnosis not present

## 2017-10-25 DIAGNOSIS — J309 Allergic rhinitis, unspecified: Secondary | ICD-10-CM | POA: Diagnosis not present

## 2017-10-25 DIAGNOSIS — I679 Cerebrovascular disease, unspecified: Secondary | ICD-10-CM | POA: Diagnosis not present

## 2017-10-25 DIAGNOSIS — G309 Alzheimer's disease, unspecified: Secondary | ICD-10-CM | POA: Diagnosis not present

## 2017-10-25 DIAGNOSIS — L8990 Pressure ulcer of unspecified site, unspecified stage: Secondary | ICD-10-CM | POA: Diagnosis not present

## 2017-10-25 DIAGNOSIS — R1310 Dysphagia, unspecified: Secondary | ICD-10-CM | POA: Diagnosis not present

## 2017-10-25 DIAGNOSIS — N183 Chronic kidney disease, stage 3 (moderate): Secondary | ICD-10-CM | POA: Diagnosis not present

## 2017-10-25 DIAGNOSIS — M2450 Contracture, unspecified joint: Secondary | ICD-10-CM | POA: Diagnosis not present

## 2017-10-25 DIAGNOSIS — M109 Gout, unspecified: Secondary | ICD-10-CM | POA: Diagnosis not present

## 2017-10-25 DIAGNOSIS — I10 Essential (primary) hypertension: Secondary | ICD-10-CM | POA: Diagnosis not present

## 2017-10-25 DIAGNOSIS — E785 Hyperlipidemia, unspecified: Secondary | ICD-10-CM | POA: Diagnosis not present

## 2017-10-26 DIAGNOSIS — I10 Essential (primary) hypertension: Secondary | ICD-10-CM | POA: Diagnosis not present

## 2017-10-26 DIAGNOSIS — R1310 Dysphagia, unspecified: Secondary | ICD-10-CM | POA: Diagnosis not present

## 2017-10-26 DIAGNOSIS — M2450 Contracture, unspecified joint: Secondary | ICD-10-CM | POA: Diagnosis not present

## 2017-10-26 DIAGNOSIS — G309 Alzheimer's disease, unspecified: Secondary | ICD-10-CM | POA: Diagnosis not present

## 2017-10-26 DIAGNOSIS — L8990 Pressure ulcer of unspecified site, unspecified stage: Secondary | ICD-10-CM | POA: Diagnosis not present

## 2017-10-26 DIAGNOSIS — E785 Hyperlipidemia, unspecified: Secondary | ICD-10-CM | POA: Diagnosis not present

## 2017-10-26 DIAGNOSIS — N183 Chronic kidney disease, stage 3 (moderate): Secondary | ICD-10-CM | POA: Diagnosis not present

## 2017-10-26 DIAGNOSIS — M109 Gout, unspecified: Secondary | ICD-10-CM | POA: Diagnosis not present

## 2017-10-26 DIAGNOSIS — I679 Cerebrovascular disease, unspecified: Secondary | ICD-10-CM | POA: Diagnosis not present

## 2017-10-26 DIAGNOSIS — J309 Allergic rhinitis, unspecified: Secondary | ICD-10-CM | POA: Diagnosis not present

## 2017-10-27 ENCOUNTER — Non-Acute Institutional Stay (SKILLED_NURSING_FACILITY): Payer: Medicare Other | Admitting: Adult Health

## 2017-10-27 ENCOUNTER — Encounter: Payer: Self-pay | Admitting: Adult Health

## 2017-10-27 DIAGNOSIS — J309 Allergic rhinitis, unspecified: Secondary | ICD-10-CM | POA: Diagnosis not present

## 2017-10-27 DIAGNOSIS — N183 Chronic kidney disease, stage 3 (moderate): Secondary | ICD-10-CM | POA: Diagnosis not present

## 2017-10-27 DIAGNOSIS — R630 Anorexia: Secondary | ICD-10-CM

## 2017-10-27 DIAGNOSIS — L89150 Pressure ulcer of sacral region, unstageable: Secondary | ICD-10-CM | POA: Diagnosis not present

## 2017-10-27 DIAGNOSIS — M109 Gout, unspecified: Secondary | ICD-10-CM | POA: Diagnosis not present

## 2017-10-27 DIAGNOSIS — I10 Essential (primary) hypertension: Secondary | ICD-10-CM | POA: Diagnosis not present

## 2017-10-27 DIAGNOSIS — F0280 Dementia in other diseases classified elsewhere without behavioral disturbance: Secondary | ICD-10-CM | POA: Diagnosis not present

## 2017-10-27 DIAGNOSIS — M2450 Contracture, unspecified joint: Secondary | ICD-10-CM | POA: Diagnosis not present

## 2017-10-27 DIAGNOSIS — R1310 Dysphagia, unspecified: Secondary | ICD-10-CM | POA: Diagnosis not present

## 2017-10-27 DIAGNOSIS — E785 Hyperlipidemia, unspecified: Secondary | ICD-10-CM | POA: Diagnosis not present

## 2017-10-27 DIAGNOSIS — G309 Alzheimer's disease, unspecified: Secondary | ICD-10-CM | POA: Diagnosis not present

## 2017-10-27 DIAGNOSIS — L8990 Pressure ulcer of unspecified site, unspecified stage: Secondary | ICD-10-CM | POA: Diagnosis not present

## 2017-10-27 DIAGNOSIS — I679 Cerebrovascular disease, unspecified: Secondary | ICD-10-CM | POA: Diagnosis not present

## 2017-10-27 NOTE — Progress Notes (Signed)
Location:  [facility] Room Number: 117-A Place of Service:  SNF (31) Provider:  Kenard Gower, NP  Patient Care Team: Pecola Lawless, MD as PCP - General (Internal Medicine) Gillis Santa, NP as Nurse Practitioner (Internal Medicine)  Extended Emergency Contact Information Primary Emergency Contact: Beauchaine,[name] Address: [address]          [place], [place] [address] [place] Home Phone: [phone] Mobile Phone: [phone] Relation: Spouse Secondary Emergency Contact: Romeo [place] Mobile Phone: [phone] Relation: Son  Code Status:  DNR  Goals of care: Advanced Directive information Advanced Directives 10/27/2017  Does Patient Have a Medical Advance Directive? Yes  Type of Advance Directive Out of facility DNR (pink MOST or yellow form)  Does patient want to make changes to medical advance directive? No - Patient declined  Copy of Healthcare Power of Attorney in Chart? -  Would patient like information on creating a medical advance directive? -  Pre-existing out of facility DNR order (yellow form or pink MOST form) -     Chief Complaint  Patient presents with  . Advanced Directive    Care plan meeting    HPI:  Pt is a 72 y.o. female seen today for medical management of chronic diseases.  She is a long-term care resident of [facility].  She is also followed by [facility].  She has a PMH of CKD, history of CVA in 2013, HTN, HLD, chronic constipation, Alzheimer's disease, and is nonverbal and nonambulatory at baseline. She was seen in the room today. She was seen eating breakfast, fed by husband. She was noted to open her right eye then close it again. Grinding of teeth can be heard while she is chewing food. Care plan meeting done with the presence of husband, Child psychotherapist, NP, Treatment nurse and MDS Coordinator. She continues to be comfort care/hospice.  Husband was asking if resident can have thickener placed consistently on the trays. Social worker will let kitchen staff know about the concerns.   Past Medical History:  Diagnosis Date  . Dementia    "alzheimer's" (05/18/2012)  . Fall 05/17/2012  . Fracture of medial wall of orbit (HCC) 05/17/2012  . Gout    "? feet" (05/18/2012); uric acid 9.4 on 08/06/16 with R wrist pain; 7.1 on 08/15/16 on Allopurinol  . Hypertension   . Incontinence of urine   . Stroke [facility]) ~ 2003   "slight memory loss" (05/18/2012)  . Wrist fracture, bilateral 05/17/2012   "fell down steps" (05/18/2012)   Past Surgical History:  Procedure Laterality Date  . fractured tooth     01/06/17 Dr Ocie Doyne DMD extracted tooth  #4   . VAGINAL HYSTERECTOMY      Allergies  Allergen Reactions  . Pollen Extract Other (See Comments)    rhinitis    Outpatient Encounter Medications as of 10/27/2017  Medication Sig  . acetaminophen (TYLENOL) 325 MG tablet Take 650 mg by mouth every 6 (six) hours as needed for moderate pain or fever.  Marland Kitchen CALCIUM-VITAMIN D PO Take 500 mg by mouth daily.  . Chloroxylenol-Zinc Oxide (BAZA EX) Apply 1 application topically daily. Apply to sacral fold  . cloNIDine (CATAPRES) 0.1 MG tablet Take 0.1 mg by mouth 2 (two) times daily.   Marland Kitchen labetalol (NORMODYNE) 300 MG tablet Take 300 mg by mouth 2 (two) times daily.  . Multiple Vitamins-Minerals (ELDERTONIC PO) Take 15 mLs by mouth 2 (two) times daily.  Marland Kitchen  Nutritional Supplements (NUTRITIONAL SUPPLEMENT PO) Take 1 each by mouth 2 (two) times daily. Magic Cup  . polyethylene glycol (MIRALAX / GLYCOLAX) packet Take 17 g by mouth daily as needed.   . senna-docusate (SENOKOT-S) 8.6-50 MG tablet Take 1 tablet by mouth 2 (two) times daily.   No facility-administered encounter medications on file as of 10/27/2017.     Review of Systems  Unable to obtain due to dementia    Immunization History  Administered Date(s) Administered  . Influenza-Unspecified  05/10/2013, 05/22/2015, 05/14/2016, 05/23/2017  . Pneumococcal-Unspecified 06/03/2012, 05/14/2016   Pertinent  Health Maintenance Due  Topic Date Due  . PNA vac Low Risk Adult (2 of 2 - PCV13) 07/21/2018 (Originally 05/14/2017)  . MAMMOGRAM  03/09/2024 (Originally 11/26/2014)  . COLONOSCOPY  03/09/2024 (Originally 09/25/1993)  . INFLUENZA VACCINE  Completed  . DEXA SCAN  Discontinued   Fall Risk  04/06/2017 09/23/2016 09/11/2016 06/10/2016 04/10/2016  Falls in the past year? Yes No No No Exclusion - non ambulatory  Number falls in past yr: 1 - - - -  Injury with Fall? Yes - - - -      Vitals:   10/27/17 1011  BP: 129/85  Pulse: 80  Weight: 117 lb 12.8 oz (53.4 kg)  Height: 5\' 1"  (1.549 m)   Body mass index is 22.26 kg/m.  Physical Exam  GENERAL APPEARANCE: Well nourished. In no acute distress. Normal body habitus SKIN:  Sacral ulcer with 70% yellow tan slough and 30% pink MOUTH and THROAT: Lips are without lesions.  RESPIRATORY: Breathing is even & unlabored, BS CTAB CARDIAC: RRR, no murmur,no extra heart sounds, no edema GI: Abdomen soft, normal BS, no masses, no tenderness EXTREMITIES: Does not move any of her extremities NEUROLOGICAL: There is no tremor. Nonverbal PSYCHIATRIC:  Affect and behavior are appropriate   Labs reviewed: Recent Labs    08/11/17 1349  08/13/17 0431 08/14/17 0214  08/16/17 0242 08/17/17 0309 08/18/17 0223  NA 156*   < > 150* 148*   < > 143 143 142  K 3.5   < > 3.1* 3.5   < > 3.2* 4.2 3.6  CL 128*   < > 123* 120*   < > 114* 114* 113*  CO2 20*   < > 18* 18*   < > 17* 14* 21*  GLUCOSE 97   < > 135* 115*   < > 123* 102* 120*  BUN 40*   < > 19 9   < > 15 17 20   CREATININE 1.95*   < > 1.51* 1.33*   < > 1.54* 1.48* 1.54*  CALCIUM 9.2   < > 8.5* 8.8*   < > 8.7* 8.7* 8.7*  MG 2.5*  --  2.0 1.8  --   --   --   --   PHOS 3.5  --   --   --   --   --   --   --    < > = values in this interval not displayed.   Recent Labs    08/11/17 0736  08/12/17 0249  AST 18 16  ALT 15 12*  ALKPHOS 77 71  BILITOT 0.6 1.0  PROT 8.3* 7.1  ALBUMIN 3.7 3.2*   Recent Labs    08/11/17 0736  08/14/17 2210  08/16/17 0242 08/17/17 0309 08/18/17 0223  WBC 12.3*   < > 11.0*   < > 15.9* 19.4* 16.0*  NEUTROABS 9.5*  --  8.5*  --   --   --   --  HGB 13.6   < > 14.8   < > 13.0 13.5 10.5*  HCT 44.3   < > 44.4   < > 40.0 39.4 32.9*  MCV 98.7   < > 92.1   < > 91.5 91.8 92.9  PLT 285   < > PLATELET CLUMPS NOTED ON SMEAR, COUNT APPEARS ADEQUATE   < > 326 296 341   < > = values in this interval not displayed.   Lab Results  Component Value Date   TSH 1.61 09/18/2016    Lab Results  Component Value Date   CHOL 126 12/17/2016   HDL 35 12/17/2016   LDLCALC 69 12/17/2016   TRIG 109 12/17/2016    Assessment/Plan  1. Essential hypertension, benign - well-controlled, continue Labetalol 300 mg BID and Clonidine 0.1 mg BID   2. Poor appetite - Body mass index is 22.26 kg/m. continue Eldertonic 15 ml BID, Magic cup BID   3. Pressure ulcer of sacral region, unstageable (HCC) - continue current treatment, keep skin clean and dry   4. Alzheimer's dementia without behavioral disturbance, unspecified timing of dementia onset - continue supportive care, fall precautions, hospice care     Family/ staff Communication:  Discussed plan of care with husband, treatment nurse, MDS cordinator.  Labs/tests ordered:  None  Goals of care:  Long-term care, hospice  Kenard GowerMonina [name], NP [facility] and Adult Medicine [phone] (Monday-Friday 8:00 a.m. - 5:00 p.m.) [phone] (after hours)

## 2017-10-28 DIAGNOSIS — G309 Alzheimer's disease, unspecified: Secondary | ICD-10-CM | POA: Diagnosis not present

## 2017-10-28 DIAGNOSIS — L8990 Pressure ulcer of unspecified site, unspecified stage: Secondary | ICD-10-CM | POA: Diagnosis not present

## 2017-10-28 DIAGNOSIS — N183 Chronic kidney disease, stage 3 (moderate): Secondary | ICD-10-CM | POA: Diagnosis not present

## 2017-10-28 DIAGNOSIS — I679 Cerebrovascular disease, unspecified: Secondary | ICD-10-CM | POA: Diagnosis not present

## 2017-10-28 DIAGNOSIS — R1310 Dysphagia, unspecified: Secondary | ICD-10-CM | POA: Diagnosis not present

## 2017-10-28 DIAGNOSIS — M109 Gout, unspecified: Secondary | ICD-10-CM | POA: Diagnosis not present

## 2017-10-28 DIAGNOSIS — I10 Essential (primary) hypertension: Secondary | ICD-10-CM | POA: Diagnosis not present

## 2017-10-28 DIAGNOSIS — J309 Allergic rhinitis, unspecified: Secondary | ICD-10-CM | POA: Diagnosis not present

## 2017-10-28 DIAGNOSIS — E785 Hyperlipidemia, unspecified: Secondary | ICD-10-CM | POA: Diagnosis not present

## 2017-10-28 DIAGNOSIS — M2450 Contracture, unspecified joint: Secondary | ICD-10-CM | POA: Diagnosis not present

## 2017-10-29 DIAGNOSIS — I10 Essential (primary) hypertension: Secondary | ICD-10-CM | POA: Diagnosis not present

## 2017-10-29 DIAGNOSIS — M2450 Contracture, unspecified joint: Secondary | ICD-10-CM | POA: Diagnosis not present

## 2017-10-29 DIAGNOSIS — R1310 Dysphagia, unspecified: Secondary | ICD-10-CM | POA: Diagnosis not present

## 2017-10-29 DIAGNOSIS — L8990 Pressure ulcer of unspecified site, unspecified stage: Secondary | ICD-10-CM | POA: Diagnosis not present

## 2017-10-29 DIAGNOSIS — M109 Gout, unspecified: Secondary | ICD-10-CM | POA: Diagnosis not present

## 2017-10-29 DIAGNOSIS — G309 Alzheimer's disease, unspecified: Secondary | ICD-10-CM | POA: Diagnosis not present

## 2017-10-29 DIAGNOSIS — I679 Cerebrovascular disease, unspecified: Secondary | ICD-10-CM | POA: Diagnosis not present

## 2017-10-29 DIAGNOSIS — N183 Chronic kidney disease, stage 3 (moderate): Secondary | ICD-10-CM | POA: Diagnosis not present

## 2017-10-29 DIAGNOSIS — E785 Hyperlipidemia, unspecified: Secondary | ICD-10-CM | POA: Diagnosis not present

## 2017-10-29 DIAGNOSIS — J309 Allergic rhinitis, unspecified: Secondary | ICD-10-CM | POA: Diagnosis not present

## 2017-10-30 DIAGNOSIS — G309 Alzheimer's disease, unspecified: Secondary | ICD-10-CM | POA: Diagnosis not present

## 2017-10-30 DIAGNOSIS — M109 Gout, unspecified: Secondary | ICD-10-CM | POA: Diagnosis not present

## 2017-10-30 DIAGNOSIS — R1310 Dysphagia, unspecified: Secondary | ICD-10-CM | POA: Diagnosis not present

## 2017-10-30 DIAGNOSIS — I10 Essential (primary) hypertension: Secondary | ICD-10-CM | POA: Diagnosis not present

## 2017-10-30 DIAGNOSIS — I679 Cerebrovascular disease, unspecified: Secondary | ICD-10-CM | POA: Diagnosis not present

## 2017-10-30 DIAGNOSIS — L8990 Pressure ulcer of unspecified site, unspecified stage: Secondary | ICD-10-CM | POA: Diagnosis not present

## 2017-10-30 DIAGNOSIS — J309 Allergic rhinitis, unspecified: Secondary | ICD-10-CM | POA: Diagnosis not present

## 2017-10-30 DIAGNOSIS — E785 Hyperlipidemia, unspecified: Secondary | ICD-10-CM | POA: Diagnosis not present

## 2017-10-30 DIAGNOSIS — N183 Chronic kidney disease, stage 3 (moderate): Secondary | ICD-10-CM | POA: Diagnosis not present

## 2017-10-30 DIAGNOSIS — M2450 Contracture, unspecified joint: Secondary | ICD-10-CM | POA: Diagnosis not present

## 2017-10-31 DIAGNOSIS — L8990 Pressure ulcer of unspecified site, unspecified stage: Secondary | ICD-10-CM | POA: Diagnosis not present

## 2017-10-31 DIAGNOSIS — M2450 Contracture, unspecified joint: Secondary | ICD-10-CM | POA: Diagnosis not present

## 2017-10-31 DIAGNOSIS — N183 Chronic kidney disease, stage 3 (moderate): Secondary | ICD-10-CM | POA: Diagnosis not present

## 2017-10-31 DIAGNOSIS — I679 Cerebrovascular disease, unspecified: Secondary | ICD-10-CM | POA: Diagnosis not present

## 2017-10-31 DIAGNOSIS — G309 Alzheimer's disease, unspecified: Secondary | ICD-10-CM | POA: Diagnosis not present

## 2017-10-31 DIAGNOSIS — R1310 Dysphagia, unspecified: Secondary | ICD-10-CM | POA: Diagnosis not present

## 2017-10-31 DIAGNOSIS — J309 Allergic rhinitis, unspecified: Secondary | ICD-10-CM | POA: Diagnosis not present

## 2017-10-31 DIAGNOSIS — E785 Hyperlipidemia, unspecified: Secondary | ICD-10-CM | POA: Diagnosis not present

## 2017-10-31 DIAGNOSIS — I10 Essential (primary) hypertension: Secondary | ICD-10-CM | POA: Diagnosis not present

## 2017-10-31 DIAGNOSIS — M109 Gout, unspecified: Secondary | ICD-10-CM | POA: Diagnosis not present

## 2017-11-01 DIAGNOSIS — I10 Essential (primary) hypertension: Secondary | ICD-10-CM | POA: Diagnosis not present

## 2017-11-01 DIAGNOSIS — G309 Alzheimer's disease, unspecified: Secondary | ICD-10-CM | POA: Diagnosis not present

## 2017-11-01 DIAGNOSIS — I679 Cerebrovascular disease, unspecified: Secondary | ICD-10-CM | POA: Diagnosis not present

## 2017-11-01 DIAGNOSIS — N183 Chronic kidney disease, stage 3 (moderate): Secondary | ICD-10-CM | POA: Diagnosis not present

## 2017-11-01 DIAGNOSIS — J309 Allergic rhinitis, unspecified: Secondary | ICD-10-CM | POA: Diagnosis not present

## 2017-11-01 DIAGNOSIS — R1310 Dysphagia, unspecified: Secondary | ICD-10-CM | POA: Diagnosis not present

## 2017-11-01 DIAGNOSIS — M109 Gout, unspecified: Secondary | ICD-10-CM | POA: Diagnosis not present

## 2017-11-01 DIAGNOSIS — E785 Hyperlipidemia, unspecified: Secondary | ICD-10-CM | POA: Diagnosis not present

## 2017-11-01 DIAGNOSIS — M2450 Contracture, unspecified joint: Secondary | ICD-10-CM | POA: Diagnosis not present

## 2017-11-01 DIAGNOSIS — L8990 Pressure ulcer of unspecified site, unspecified stage: Secondary | ICD-10-CM | POA: Diagnosis not present

## 2017-11-02 DIAGNOSIS — G309 Alzheimer's disease, unspecified: Secondary | ICD-10-CM | POA: Diagnosis not present

## 2017-11-02 DIAGNOSIS — I10 Essential (primary) hypertension: Secondary | ICD-10-CM | POA: Diagnosis not present

## 2017-11-02 DIAGNOSIS — L8990 Pressure ulcer of unspecified site, unspecified stage: Secondary | ICD-10-CM | POA: Diagnosis not present

## 2017-11-02 DIAGNOSIS — R1310 Dysphagia, unspecified: Secondary | ICD-10-CM | POA: Diagnosis not present

## 2017-11-02 DIAGNOSIS — J309 Allergic rhinitis, unspecified: Secondary | ICD-10-CM | POA: Diagnosis not present

## 2017-11-02 DIAGNOSIS — M2450 Contracture, unspecified joint: Secondary | ICD-10-CM | POA: Diagnosis not present

## 2017-11-02 DIAGNOSIS — M109 Gout, unspecified: Secondary | ICD-10-CM | POA: Diagnosis not present

## 2017-11-02 DIAGNOSIS — I679 Cerebrovascular disease, unspecified: Secondary | ICD-10-CM | POA: Diagnosis not present

## 2017-11-02 DIAGNOSIS — E785 Hyperlipidemia, unspecified: Secondary | ICD-10-CM | POA: Diagnosis not present

## 2017-11-02 DIAGNOSIS — N183 Chronic kidney disease, stage 3 (moderate): Secondary | ICD-10-CM | POA: Diagnosis not present

## 2017-11-03 DIAGNOSIS — J309 Allergic rhinitis, unspecified: Secondary | ICD-10-CM | POA: Diagnosis not present

## 2017-11-03 DIAGNOSIS — L8990 Pressure ulcer of unspecified site, unspecified stage: Secondary | ICD-10-CM | POA: Diagnosis not present

## 2017-11-03 DIAGNOSIS — M109 Gout, unspecified: Secondary | ICD-10-CM | POA: Diagnosis not present

## 2017-11-03 DIAGNOSIS — E785 Hyperlipidemia, unspecified: Secondary | ICD-10-CM | POA: Diagnosis not present

## 2017-11-03 DIAGNOSIS — I10 Essential (primary) hypertension: Secondary | ICD-10-CM | POA: Diagnosis not present

## 2017-11-03 DIAGNOSIS — G309 Alzheimer's disease, unspecified: Secondary | ICD-10-CM | POA: Diagnosis not present

## 2017-11-03 DIAGNOSIS — R1310 Dysphagia, unspecified: Secondary | ICD-10-CM | POA: Diagnosis not present

## 2017-11-03 DIAGNOSIS — N183 Chronic kidney disease, stage 3 (moderate): Secondary | ICD-10-CM | POA: Diagnosis not present

## 2017-11-03 DIAGNOSIS — M2450 Contracture, unspecified joint: Secondary | ICD-10-CM | POA: Diagnosis not present

## 2017-11-03 DIAGNOSIS — I679 Cerebrovascular disease, unspecified: Secondary | ICD-10-CM | POA: Diagnosis not present

## 2017-11-04 DIAGNOSIS — R1310 Dysphagia, unspecified: Secondary | ICD-10-CM | POA: Diagnosis not present

## 2017-11-04 DIAGNOSIS — L8990 Pressure ulcer of unspecified site, unspecified stage: Secondary | ICD-10-CM | POA: Diagnosis not present

## 2017-11-04 DIAGNOSIS — I679 Cerebrovascular disease, unspecified: Secondary | ICD-10-CM | POA: Diagnosis not present

## 2017-11-04 DIAGNOSIS — N183 Chronic kidney disease, stage 3 (moderate): Secondary | ICD-10-CM | POA: Diagnosis not present

## 2017-11-04 DIAGNOSIS — E785 Hyperlipidemia, unspecified: Secondary | ICD-10-CM | POA: Diagnosis not present

## 2017-11-04 DIAGNOSIS — M109 Gout, unspecified: Secondary | ICD-10-CM | POA: Diagnosis not present

## 2017-11-04 DIAGNOSIS — I10 Essential (primary) hypertension: Secondary | ICD-10-CM | POA: Diagnosis not present

## 2017-11-04 DIAGNOSIS — G309 Alzheimer's disease, unspecified: Secondary | ICD-10-CM | POA: Diagnosis not present

## 2017-11-04 DIAGNOSIS — M2450 Contracture, unspecified joint: Secondary | ICD-10-CM | POA: Diagnosis not present

## 2017-11-04 DIAGNOSIS — J309 Allergic rhinitis, unspecified: Secondary | ICD-10-CM | POA: Diagnosis not present

## 2017-11-05 DIAGNOSIS — R1310 Dysphagia, unspecified: Secondary | ICD-10-CM | POA: Diagnosis not present

## 2017-11-05 DIAGNOSIS — L8990 Pressure ulcer of unspecified site, unspecified stage: Secondary | ICD-10-CM | POA: Diagnosis not present

## 2017-11-05 DIAGNOSIS — N183 Chronic kidney disease, stage 3 (moderate): Secondary | ICD-10-CM | POA: Diagnosis not present

## 2017-11-05 DIAGNOSIS — I679 Cerebrovascular disease, unspecified: Secondary | ICD-10-CM | POA: Diagnosis not present

## 2017-11-05 DIAGNOSIS — G309 Alzheimer's disease, unspecified: Secondary | ICD-10-CM | POA: Diagnosis not present

## 2017-11-05 DIAGNOSIS — E785 Hyperlipidemia, unspecified: Secondary | ICD-10-CM | POA: Diagnosis not present

## 2017-11-05 DIAGNOSIS — M2450 Contracture, unspecified joint: Secondary | ICD-10-CM | POA: Diagnosis not present

## 2017-11-05 DIAGNOSIS — J309 Allergic rhinitis, unspecified: Secondary | ICD-10-CM | POA: Diagnosis not present

## 2017-11-05 DIAGNOSIS — M109 Gout, unspecified: Secondary | ICD-10-CM | POA: Diagnosis not present

## 2017-11-05 DIAGNOSIS — I10 Essential (primary) hypertension: Secondary | ICD-10-CM | POA: Diagnosis not present

## 2017-11-06 DIAGNOSIS — G309 Alzheimer's disease, unspecified: Secondary | ICD-10-CM | POA: Diagnosis not present

## 2017-11-06 DIAGNOSIS — M109 Gout, unspecified: Secondary | ICD-10-CM | POA: Diagnosis not present

## 2017-11-06 DIAGNOSIS — J309 Allergic rhinitis, unspecified: Secondary | ICD-10-CM | POA: Diagnosis not present

## 2017-11-06 DIAGNOSIS — E785 Hyperlipidemia, unspecified: Secondary | ICD-10-CM | POA: Diagnosis not present

## 2017-11-06 DIAGNOSIS — R1310 Dysphagia, unspecified: Secondary | ICD-10-CM | POA: Diagnosis not present

## 2017-11-06 DIAGNOSIS — N183 Chronic kidney disease, stage 3 (moderate): Secondary | ICD-10-CM | POA: Diagnosis not present

## 2017-11-06 DIAGNOSIS — L8990 Pressure ulcer of unspecified site, unspecified stage: Secondary | ICD-10-CM | POA: Diagnosis not present

## 2017-11-06 DIAGNOSIS — M2450 Contracture, unspecified joint: Secondary | ICD-10-CM | POA: Diagnosis not present

## 2017-11-06 DIAGNOSIS — I679 Cerebrovascular disease, unspecified: Secondary | ICD-10-CM | POA: Diagnosis not present

## 2017-11-06 DIAGNOSIS — I10 Essential (primary) hypertension: Secondary | ICD-10-CM | POA: Diagnosis not present

## 2017-11-07 DIAGNOSIS — I679 Cerebrovascular disease, unspecified: Secondary | ICD-10-CM | POA: Diagnosis not present

## 2017-11-07 DIAGNOSIS — J309 Allergic rhinitis, unspecified: Secondary | ICD-10-CM | POA: Diagnosis not present

## 2017-11-07 DIAGNOSIS — M2450 Contracture, unspecified joint: Secondary | ICD-10-CM | POA: Diagnosis not present

## 2017-11-07 DIAGNOSIS — L8990 Pressure ulcer of unspecified site, unspecified stage: Secondary | ICD-10-CM | POA: Diagnosis not present

## 2017-11-07 DIAGNOSIS — R1310 Dysphagia, unspecified: Secondary | ICD-10-CM | POA: Diagnosis not present

## 2017-11-07 DIAGNOSIS — N183 Chronic kidney disease, stage 3 (moderate): Secondary | ICD-10-CM | POA: Diagnosis not present

## 2017-11-07 DIAGNOSIS — G309 Alzheimer's disease, unspecified: Secondary | ICD-10-CM | POA: Diagnosis not present

## 2017-11-07 DIAGNOSIS — I10 Essential (primary) hypertension: Secondary | ICD-10-CM | POA: Diagnosis not present

## 2017-11-07 DIAGNOSIS — E785 Hyperlipidemia, unspecified: Secondary | ICD-10-CM | POA: Diagnosis not present

## 2017-11-07 DIAGNOSIS — M109 Gout, unspecified: Secondary | ICD-10-CM | POA: Diagnosis not present

## 2017-11-09 ENCOUNTER — Inpatient Hospital Stay (HOSPITAL_COMMUNITY)
Admission: EM | Admit: 2017-11-09 | Discharge: 2017-11-13 | DRG: 640 | Disposition: A | Discharge status: Skilled Nursing Facility | Attending: Family Medicine | Admitting: Family Medicine

## 2017-11-09 ENCOUNTER — Encounter (HOSPITAL_COMMUNITY): Payer: Self-pay | Admitting: Emergency Medicine

## 2017-11-09 ENCOUNTER — Emergency Department (HOSPITAL_COMMUNITY)

## 2017-11-09 ENCOUNTER — Encounter: Payer: Self-pay | Admitting: Internal Medicine

## 2017-11-09 ENCOUNTER — Non-Acute Institutional Stay (SKILLED_NURSING_FACILITY): Payer: Medicare Other | Admitting: Internal Medicine

## 2017-11-09 DIAGNOSIS — E440 Moderate protein-calorie malnutrition: Secondary | ICD-10-CM | POA: Diagnosis present

## 2017-11-09 DIAGNOSIS — I10 Essential (primary) hypertension: Secondary | ICD-10-CM | POA: Diagnosis not present

## 2017-11-09 DIAGNOSIS — Z66 Do not resuscitate: Secondary | ICD-10-CM | POA: Diagnosis present

## 2017-11-09 DIAGNOSIS — G9341 Metabolic encephalopathy: Secondary | ICD-10-CM | POA: Diagnosis present

## 2017-11-09 DIAGNOSIS — F0280 Dementia in other diseases classified elsewhere without behavioral disturbance: Secondary | ICD-10-CM | POA: Diagnosis present

## 2017-11-09 DIAGNOSIS — R6511 Systemic inflammatory response syndrome (SIRS) of non-infectious origin with acute organ dysfunction: Secondary | ICD-10-CM | POA: Diagnosis present

## 2017-11-09 DIAGNOSIS — R509 Fever, unspecified: Secondary | ICD-10-CM | POA: Diagnosis not present

## 2017-11-09 DIAGNOSIS — L89819 Pressure ulcer of head, unspecified stage: Secondary | ICD-10-CM | POA: Diagnosis not present

## 2017-11-09 DIAGNOSIS — Z515 Encounter for palliative care: Secondary | ICD-10-CM | POA: Diagnosis not present

## 2017-11-09 DIAGNOSIS — E870 Hyperosmolality and hypernatremia: Principal | ICD-10-CM | POA: Diagnosis present

## 2017-11-09 DIAGNOSIS — E860 Dehydration: Secondary | ICD-10-CM | POA: Diagnosis present

## 2017-11-09 DIAGNOSIS — N179 Acute kidney failure, unspecified: Secondary | ICD-10-CM | POA: Diagnosis present

## 2017-11-09 DIAGNOSIS — N183 Chronic kidney disease, stage 3 unspecified: Secondary | ICD-10-CM | POA: Diagnosis present

## 2017-11-09 DIAGNOSIS — R6510 Systemic inflammatory response syndrome (SIRS) of non-infectious origin without acute organ dysfunction: Secondary | ICD-10-CM

## 2017-11-09 DIAGNOSIS — Z87891 Personal history of nicotine dependence: Secondary | ICD-10-CM

## 2017-11-09 DIAGNOSIS — A419 Sepsis, unspecified organism: Secondary | ICD-10-CM | POA: Diagnosis present

## 2017-11-09 DIAGNOSIS — R627 Adult failure to thrive: Secondary | ICD-10-CM | POA: Diagnosis not present

## 2017-11-09 DIAGNOSIS — Z6821 Body mass index (BMI) 21.0-21.9, adult: Secondary | ICD-10-CM

## 2017-11-09 DIAGNOSIS — I129 Hypertensive chronic kidney disease with stage 1 through stage 4 chronic kidney disease, or unspecified chronic kidney disease: Secondary | ICD-10-CM | POA: Diagnosis present

## 2017-11-09 DIAGNOSIS — G309 Alzheimer's disease, unspecified: Secondary | ICD-10-CM | POA: Diagnosis present

## 2017-11-09 DIAGNOSIS — Z8673 Personal history of transient ischemic attack (TIA), and cerebral infarction without residual deficits: Secondary | ICD-10-CM

## 2017-11-09 DIAGNOSIS — L89152 Pressure ulcer of sacral region, stage 2: Secondary | ICD-10-CM | POA: Diagnosis present

## 2017-11-09 DIAGNOSIS — D631 Anemia in chronic kidney disease: Secondary | ICD-10-CM | POA: Diagnosis present

## 2017-11-09 DIAGNOSIS — L8990 Pressure ulcer of unspecified site, unspecified stage: Secondary | ICD-10-CM

## 2017-11-09 DIAGNOSIS — F0150 Vascular dementia without behavioral disturbance: Secondary | ICD-10-CM

## 2017-11-09 DIAGNOSIS — R0682 Tachypnea, not elsewhere classified: Secondary | ICD-10-CM | POA: Diagnosis present

## 2017-11-09 DIAGNOSIS — D649 Anemia, unspecified: Secondary | ICD-10-CM | POA: Diagnosis not present

## 2017-11-09 DIAGNOSIS — Z8679 Personal history of other diseases of the circulatory system: Secondary | ICD-10-CM

## 2017-11-09 DIAGNOSIS — R000 Tachycardia, unspecified: Secondary | ICD-10-CM | POA: Diagnosis present

## 2017-11-09 LAB — CBC WITH DIFFERENTIAL/PLATELET
Basophils Absolute: 0 K/uL (ref 0.0–0.1)
Basophils Relative: 0 %
Eosinophils Absolute: 0 K/uL (ref 0.0–0.7)
Eosinophils Relative: 0 %
HCT: 41.2 % (ref 36.0–46.0)
Hemoglobin: 13 g/dL (ref 12.0–15.0)
Lymphocytes Relative: 12 %
Lymphs Abs: 3 K/uL (ref 0.7–4.0)
MCH: 30.2 pg (ref 26.0–34.0)
MCHC: 31.6 g/dL (ref 30.0–36.0)
MCV: 95.6 fL (ref 78.0–100.0)
Monocytes Absolute: 1 K/uL (ref 0.1–1.0)
Monocytes Relative: 4 %
Neutro Abs: 21.4 K/uL — ABNORMAL HIGH (ref 1.7–7.7)
Neutrophils Relative %: 84 %
Platelets: 328 K/uL (ref 150–400)
RBC: 4.31 MIL/uL (ref 3.87–5.11)
RDW: 14.6 % (ref 11.5–15.5)
WBC: 25.4 K/uL — ABNORMAL HIGH (ref 4.0–10.5)

## 2017-11-09 LAB — I-STAT CG4 LACTIC ACID, ED: Lactic Acid, Venous: 1.37 mmol/L (ref 0.5–1.9)

## 2017-11-09 LAB — URINALYSIS, ROUTINE W REFLEX MICROSCOPIC
Bilirubin Urine: NEGATIVE
GLUCOSE, UA: NEGATIVE mg/dL
Hgb urine dipstick: NEGATIVE
KETONES UR: NEGATIVE mg/dL
Leukocytes, UA: NEGATIVE
NITRITE: NEGATIVE
PH: 5 (ref 5.0–8.0)
Protein, ur: 30 mg/dL — AB
Specific Gravity, Urine: 1.019 (ref 1.005–1.030)

## 2017-11-09 LAB — COMPREHENSIVE METABOLIC PANEL
ALBUMIN: 2.9 g/dL — AB (ref 3.5–5.0)
ALT: 14 U/L (ref 14–54)
ANION GAP: 12 (ref 5–15)
AST: 19 U/L (ref 15–41)
Alkaline Phosphatase: 65 U/L (ref 38–126)
BUN: 43 mg/dL — ABNORMAL HIGH (ref 6–20)
CHLORIDE: 128 mmol/L — AB (ref 101–111)
CO2: 19 mmol/L — AB (ref 22–32)
Calcium: 9 mg/dL (ref 8.9–10.3)
Creatinine, Ser: 2.09 mg/dL — ABNORMAL HIGH (ref 0.44–1.00)
GFR calc non Af Amer: 22 mL/min — ABNORMAL LOW (ref >60)
GFR, EST AFRICAN AMERICAN: 26 mL/min — AB (ref >60)
Glucose, Bld: 119 mg/dL — ABNORMAL HIGH (ref 65–99)
Potassium: 3.6 mmol/L (ref 3.5–5.1)
SODIUM: 159 mmol/L — AB (ref 135–145)
Total Bilirubin: 1 mg/dL (ref 0.3–1.2)
Total Protein: 7.9 g/dL (ref 6.5–8.1)

## 2017-11-09 LAB — PROTIME-INR
INR: 1.21
Prothrombin Time: 15.2 seconds (ref 11.4–15.2)

## 2017-11-09 MED ORDER — SODIUM CHLORIDE 0.9 % IV BOLUS
1000.0000 mL | Freq: Once | INTRAVENOUS | Status: AC
  Administered 2017-11-09: 1000 mL via INTRAVENOUS

## 2017-11-09 MED ORDER — VANCOMYCIN HCL IN DEXTROSE 1-5 GM/200ML-% IV SOLN
1000.0000 mg | Freq: Once | INTRAVENOUS | Status: AC
  Administered 2017-11-09: 1000 mg via INTRAVENOUS
  Filled 2017-11-09: qty 200

## 2017-11-09 MED ORDER — PIPERACILLIN-TAZOBACTAM 3.375 G IVPB 30 MIN
3.3750 g | Freq: Once | INTRAVENOUS | Status: AC
  Administered 2017-11-09: 3.375 g via INTRAVENOUS
  Filled 2017-11-09: qty 50

## 2017-11-09 MED ORDER — ACETAMINOPHEN 650 MG RE SUPP
650.0000 mg | Freq: Once | RECTAL | Status: AC
  Administered 2017-11-09: 650 mg via RECTAL
  Filled 2017-11-09: qty 1

## 2017-11-09 NOTE — Assessment & Plan Note (Addendum)
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

## 2017-11-09 NOTE — ED Triage Notes (Signed)
Pt arrives via EMS from [facility] with decreased level of consciousness and fever today. EMS unable to report what patient's mental status baseline is - just more alert. Snoring respirations at this time. Hot to the touch. DNR at bedside

## 2017-11-09 NOTE — H&P (Signed)
History and Physical    Amanda [name] ZOX:[id number] DOB: 03/23/1946 DOA: 11/09/2017  Referring MD/NP/PA:   PCP: Amanda Lawless, MD   Patient coming from:  The patient is coming from SNF.  At baseline, pt is dependent for most of ADL.   Chief Complaint: fever and decreased level of consciousness.  HPI: Amanda [name] is a 72 y.o. female with medical history significant of stroke (non-verbal), hypertension, dementia, gout, s/p of aortic aneurysm repair, under hospice care, who presents with fever, decreased level of consciousness.  Per family, pt is non-verbal and barely moving extremities at baseline, but she seems to have a decreased level of consciousness today. She has fever of 103 at SNF. She has snoring respirations at this time. Not active cough, nausea, vomiting, diarrhea noted. Review patient has any chest pain or abdominal pain. Patient has any symptoms of UTI. When I saw pt in ED, she is unresponsive and grimace to painful stimuli.   ED Course: pt was found to have WBC 24.4, lactic acid 1.37, INR 1.21, negative urinalysis, sodium 159, worsening renal function, temperature 101.5, tachycardia, tachypnea, oxygen saturation 92% on 2 L nasal cannula oxygen. Chest x-ray is a limited study, but is negative for acute issues. Pt is admitted to telemetry bed as inpatient.  Review of Systems: could not be reviewed due to nonverbal status and altered mental status  Travel history: No recent long distant travel.  Allergy:  Allergies  Allergen Reactions  . Pollen Extract Other (See Comments)    rhinitis    Past Medical History:  Diagnosis Date  . Dementia    "alzheimer's" (05/18/2012)  . Fall 05/17/2012  . Fracture of medial wall of orbit (HCC) 05/17/2012  . Gout    "? feet" (05/18/2012); uric acid 9.4 on 08/06/16 with R wrist pain; 7.1 on 08/15/16 on Allopurinol  . Hypertension   . Incontinence of urine   . Stroke [facility]) ~ 2003   "slight memory loss" (05/18/2012)  . Wrist  fracture, bilateral 05/17/2012   "fell down steps" (05/18/2012)    Past Surgical History:  Procedure Laterality Date  . fractured tooth     01/06/17 Dr Ocie Doyne DMD extracted tooth  #4   . VAGINAL HYSTERECTOMY      Social History:  reports that she has quit smoking. Her smoking use included cigarettes. She has never used smokeless tobacco. She reports that she does not drink alcohol or use drugs.  Family History:  Family History  Problem Relation Age of Onset  . Dementia Mother   . Dementia Father   . Dementia Sister   . Dementia Brother      Prior to Admission medications   Medication Sig Start Date End Date Taking? Authorizing Provider  acetaminophen (TYLENOL) 325 MG tablet Take 650 mg by mouth every 6 (six) hours as needed for moderate pain or fever.    [provider]  CALCIUM-VITAMIN D PO Take 500 mg by mouth daily.    [provider]  Chloroxylenol-Zinc Oxide (BAZA EX) Apply 1 application topically daily. Apply to sacral fold    [provider]  cloNIDine (CATAPRES) 0.1 MG tablet Take 0.1 mg by mouth 2 (two) times daily.     [provider]  labetalol (NORMODYNE) 300 MG tablet Take 300 mg by mouth 2 (two) times daily.    [provider]  Multiple Vitamins-Minerals (ELDERTONIC PO) Take 15 mLs by mouth 2 (two) times daily.    [provider]  Nutritional  Supplements (NUTRITIONAL SUPPLEMENT PO) Take 1 each by mouth 2 (two) times daily. Magic Cup    [provider]  polyethylene glycol (MIRALAX / GLYCOLAX) packet Take 17 g by mouth daily as needed.     [provider]  senna-docusate (SENOKOT-S) 8.6-50 MG tablet Take 1-2 tablets by mouth 2 (two) times daily. Take 1 tablet QAM and 2 tablets QPM    [provider]    Physical Exam: Vitals:   11/09/17 2144 11/09/17 2215 11/10/17 0015 11/10/17 0100  BP: (!) 164/107  (!) 155/81 (!) 148/82  Pulse: (!) 126 (!) 124 (!) 115 (!) 101  Resp: (!) 25 (!) 36  (!) 31 11  Temp: (!) 101.5 F (38.6 C)     TempSrc: Rectal     SpO2: 93% 96% 96% 96%  Weight: 52.9 kg (116 lb 10 oz)      General: Not in acute distress HEENT:       Eyes: PERRL, EOMI, no scleral icterus.       ENT: No discharge from the ears and nose.       Neck: No JVD, no bruit, no mass felt. Heme: No neck lymph node enlargement. Cardiac: S1/S2, RRR, No murmurs, No gallops or rubs. Respiratory: No rales, wheezing, rhonchi or rubs. GI: Soft, nondistended, nontender, no organomegaly, BS present. GU: No hematuria Ext: No pitting leg edema bilaterally. 2+DP/PT pulse bilaterally. Musculoskeletal: No joint deformities, No joint redness or warmth, no limitation of ROM in spin. Skin: No rashes.  Neuro: unresponsive and grimace to painful stimuli.  cranial nerves II-XII grossly intact. Psych: Patient is not psychotic, no suicidal or hemocidal ideation.  Labs on Admission: I have personally reviewed following labs and imaging studies  CBC: Recent Labs  Lab 11/09/17 2205  WBC 25.4*  NEUTROABS 21.4*  HGB 13.0  HCT 41.2  MCV 95.6  PLT 328   Basic Metabolic Panel: Recent Labs  Lab 11/09/17 2205  NA 159*  K 3.6  CL 128*  CO2 19*  GLUCOSE 119*  BUN 43*  CREATININE 2.09*  CALCIUM 9.0   GFR: Estimated Creatinine Clearance: 17.8 mL/min (A) (by C-G formula based on SCr of 2.09 mg/dL (H)). Liver Function Tests: Recent Labs  Lab 11/09/17 2205  AST 19  ALT 14  ALKPHOS 65  BILITOT 1.0  PROT 7.9  ALBUMIN 2.9*   No results for input(s): LIPASE, AMYLASE in the last 168 hours. No results for input(s): AMMONIA in the last 168 hours. Coagulation Profile: Recent Labs  Lab 11/09/17 2205  INR 1.21   Cardiac Enzymes: No results for input(s): CKTOTAL, CKMB, CKMBINDEX, TROPONINI in the last 168 hours. BNP (last 3 results) No results for input(s): PROBNP in the last 8760 hours. HbA1C: No results for input(s): HGBA1C in the last 72 hours. CBG: No results for input(s): GLUCAP  in the last 168 hours. Lipid Profile: No results for input(s): CHOL, HDL, LDLCALC, TRIG, CHOLHDL, LDLDIRECT in the last 72 hours. Thyroid Function Tests: No results for input(s): TSH, T4TOTAL, FREET4, T3FREE, THYROIDAB in the last 72 hours. Anemia Panel: No results for input(s): VITAMINB12, FOLATE, FERRITIN, TIBC, IRON, RETICCTPCT in the last 72 hours. Urine analysis:    Component Value Date/Time   COLORURINE YELLOW 11/09/2017 2205   APPEARANCEUR HAZY (A) 11/09/2017 2205   LABSPEC 1.019 11/09/2017 2205   PHURINE 5.0 11/09/2017 2205   GLUCOSEU NEGATIVE 11/09/2017 2205   HGBUR NEGATIVE 11/09/2017 2205   BILIRUBINUR NEGATIVE 11/09/2017 2205   KETONESUR NEGATIVE 11/09/2017 2205  PROTEINUR 30 (A) 11/09/2017 2205   UROBILINOGEN 0.2 05/17/2012 1424   NITRITE NEGATIVE 11/09/2017 2205   LEUKOCYTESUR NEGATIVE 11/09/2017 2205   Sepsis Labs: @LABRCNTIP (procalcitonin:4,lacticidven:4) )No results found for this or any previous visit (from the past 240 hour(s)).   Radiological Exams on Admission: Dg Chest Portable 1 View  Result Date: 11/09/2017 CLINICAL DATA:  Sepsis. EXAM: PORTABLE CHEST 1 VIEW COMPARISON:  Chest x-ray dated August 15, 2017. FINDINGS: The patient is rotated to the right. Cardiomegaly. Tortuous, aneurysmal thoracic aorta, better evaluated on prior CT. Normal pulmonary vascularity. Streaky atelectasis in the right mid lung. No focal consolidation, pleural effusion, or pneumothorax. No acute osseous abnormality. IMPRESSION: 1. Limited evaluation due to patient rotation.  No active disease. Electronically Signed   By: Obie DredgeWilliam T Derry M.D.   On: 11/09/2017 22:13     EKG: Not done in ED, will get one.   Assessment/Plan Principal Problem:   Sepsis (HCC) Active Problems:   Essential hypertension, benign   Acute renal failure superimposed on stage 3 chronic kidney disease (HCC)   DNR (do not resuscitate)   Hospice care   Hypernatremia   Acute metabolic  encephalopathy   Sepsis [facility](HCC): patient meets criteria for sepsis with leukocytosis, fever, tachycardia and tachypnea. Lactic acid is normal. Currently hemodynamically stable. Source of infection is not clear. Urinalysis negative. Chest x-ray is limited study, but did not show acute issues. Potential differential diagnosis is aspiration pneumonia. Meningitis is also a differential diagnosis. Pt is DNR and under hospice care. Family dose not want dramatic or invasive measures. Will treat pt empirically with antibiotics with broader coverage now.   -will admit to tele bed as inpt -IV vanco and zosyn -will get Procalcitonin and trend lactic acid levels per sepsis protocol. -IVF: 2L of NS bolus in ED, followed by D5-1/2 NS at 100 cc/h  -f/u Blood culture   Hypernatremia: Na 159. Likely due to poor oral intake of water -IV as above -f/u by BMP -check urine and plasma osmo and urine sodium level  Essential hypertension, benign: -hold oral meds now -IV hydralazine when necessary  AoCKD-III: Baseline Cre is 1.3-1.5, pt's Cre is 2.09 and BUN 43 on admission. Likely due to prerenal secondary to dehydration and continuation - IVF as above - Check FeNa - Follow up renal function by BMP  Acute metabolic encephalopathy: likely multifactorial etiology, including worsening renal function, sepsis, hypernatremia. I discussed with her son about getting CT head. Her son states that he wants pt be treated with abx now to see pt's response to the treatment. His son dose not want pt get CT-head now. -treat underlying issues as above -Frequent neuro check -hold oral meds now  Hospice care: -need to inform hospice care in AM.  DVT ppx: SQ Lovenox Code Status: DNR (pt has yellow paper with DNR from SNF) Family Communication: Yes, patient's husband, son, daughter-in-law, 3 grandsons  at bed side Disposition Plan:  Anticipate discharge back to previous SNF Consults called:  none Admission status:    Inpatient/tele           Date of Service 11/10/2017    Lorretta HarpXilin Rithwik Schmieg [facility] Pager [phone]  If 7PM-7AM, please contact night-coverage [email or web address] Password [email or web address] 11/10/2017, 1:20 AM

## 2017-11-09 NOTE — ED Provider Notes (Signed)
[facility] EMERGENCY DEPARTMENT Provider Note   CSN: [id number] Arrival date & time: 11/09/17  2141     History   Chief Complaint Chief Complaint  Patient presents with  . Altered Mental Status    HPI Amanda [name] is a 72 y.o. female.  The history is provided by the patient. No language interpreter was used.  Altered Mental Status      Amanda [name] is a 72 y.o. female who presents to the Emergency Department complaining of fever.  Level V caveat due to AMS.  Hx is provided by EMS, nursing facility.  Per report she was sent to the ED for fever to 103 today.  She is currently on hospice for dementia.  Baseline mental status is nonverbal, advanced dementia.  Per hospice she does not want tube feeds, no additional info available.    Past Medical History:  Diagnosis Date  . Dementia    "alzheimer's" (05/18/2012)  . Fall 05/17/2012  . Fracture of medial wall of orbit (HCC) 05/17/2012  . Gout    "? feet" (05/18/2012); uric acid 9.4 on 08/06/16 with R wrist pain; 7.1 on 08/15/16 on Allopurinol  . Hypertension   . Incontinence of urine   . Stroke [facility]) ~ 2003   "slight memory loss" (05/18/2012)  . Wrist fracture, bilateral 05/17/2012   "fell down steps" (05/18/2012)    Patient Active Problem List   Diagnosis Date Noted  . Hypernatremia 11/10/2017  . Acute metabolic encephalopathy 11/10/2017  . Alzheimer disease   . DNR (do not resuscitate)   . Hospice care   . Aspiration pneumonia (HCC)   . Palliative care encounter   . Pressure injury of skin 08/12/2017  . History of stroke 08/11/2017  . Acute confusional state 08/11/2017  . Dehydration with hypernatremia 08/11/2017  . Sepsis (HCC) 08/11/2017  . Chronic constipation 09/17/2016  . History of gout 08/06/2016  . Anemia of chronic disease 10/08/2014  . Allergic rhinitis 11/24/2013  . Hyperlipidemia 02/27/2013  . Anxiety state 12/12/2012  . Vitamin D deficiency 12/12/2012  . Acute renal  failure superimposed on stage 3 chronic kidney disease (HCC) 05/17/2012  . Essential hypertension, benign   . Dementia with behavioral disturbance     Past Surgical History:  Procedure Laterality Date  . fractured tooth     01/06/17 Dr Ocie Doyne DMD extracted tooth  #4   . VAGINAL HYSTERECTOMY       OB History   None      Home Medications    Prior to Admission medications   Medication Sig Start Date End Date Taking? Authorizing Provider  acetaminophen (TYLENOL) 325 MG tablet Take 650 mg by mouth every 6 (six) hours as needed for moderate pain or fever.    [provider]  CALCIUM-VITAMIN D PO Take 500 mg by mouth daily.    [provider]  Chloroxylenol-Zinc Oxide (BAZA EX) Apply 1 application topically daily. Apply to sacral fold    [provider]  cloNIDine (CATAPRES) 0.1 MG tablet Take 0.1 mg by mouth 2 (two) times daily.     [provider]  labetalol (NORMODYNE) 300 MG tablet Take 300 mg by mouth 2 (two) times daily.    [provider]  Multiple Vitamins-Minerals (ELDERTONIC PO) Take 15 mLs by mouth 2 (two) times daily.    [provider]  Nutritional Supplements (NUTRITIONAL SUPPLEMENT PO) Take 1 each by mouth 2 (two) times daily. Manufacturing engineer, Historical,  MD  polyethylene glycol (MIRALAX / GLYCOLAX) packet Take 17 g by mouth daily as needed.     [provider]  senna-docusate (SENOKOT-S) 8.6-50 MG tablet Take 1-2 tablets by mouth 2 (two) times daily. Take 1 tablet QAM and 2 tablets QPM    [provider]    Family History Family History  Problem Relation Age of Onset  . Dementia Mother   . Dementia Father   . Dementia Sister   . Dementia Brother     Social History Social History   Tobacco Use  . Smoking status: Former Smoker    Types: Cigarettes  . Smokeless tobacco: Never Used  . Tobacco comment: Staff at [facility] reports that patient no longer smokes.   Substance Use Topics   . Alcohol use: No    Alcohol/week: 0.0 oz    Comment: [facility] reports that patient no longer drinks.   . Drug use: No    Types: Marijuana     Allergies   Pollen extract   Review of Systems Review of Systems  All other systems reviewed and are negative.    Physical Exam Updated Vital Signs BP (!) 148/82   Pulse (!) 101   Temp (!) 101.5 F (38.6 C) (Rectal)   Resp 11   Wt 52.9 kg (116 lb 10 oz)   SpO2 96%   BMI 22.04 kg/m   Physical Exam  Constitutional: She appears well-developed. She appears distressed.  Chronically ill appearing  HENT:  Head: Normocephalic and atraumatic.  Right lip ulceration.  Thick white and yellow sputum in mouth.   Cardiovascular: Regular rhythm.  No murmur heard. tachcyardic  Pulmonary/Chest: Effort normal. No respiratory distress.  Diffuse rhonchi  Abdominal: Soft. There is no tenderness. There is no rebound and no guarding.  Musculoskeletal:  Contractures of all four extremities  Neurological:  Unresponsive.  Grimace to painful stimuli.  Nonverbal. Resists movement of BUE.    Skin: Skin is warm and dry.  Psychiatric:  Unable to assess  Nursing note and vitals reviewed.    ED Treatments / Results  Labs (all labs ordered are listed, but only abnormal results are displayed) Labs Reviewed  COMPREHENSIVE METABOLIC PANEL - Abnormal; Notable for the following components:      Result Value   Sodium 159 (*)    Chloride 128 (*)    CO2 19 (*)    Glucose, Bld 119 (*)    BUN 43 (*)    Creatinine, Ser 2.09 (*)    Albumin 2.9 (*)    GFR calc non Af Amer 22 (*)    GFR calc Af Amer 26 (*)    All other components within normal limits  CBC WITH DIFFERENTIAL/PLATELET - Abnormal; Notable for the following components:   WBC 25.4 (*)    Neutro Abs 21.4 (*)    All other components within normal limits  URINALYSIS, ROUTINE W REFLEX MICROSCOPIC - Abnormal; Notable for the following components:   APPearance HAZY (*)    Protein, ur  30 (*)    Bacteria, UA RARE (*)    Squamous Epithelial / LPF 0-5 (*)    All other components within normal limits  CULTURE, BLOOD (ROUTINE X 2)  CULTURE, BLOOD (ROUTINE X 2)  PROTIME-INR  OSMOLALITY, URINE  OSMOLALITY  CREATININE, URINE, RANDOM  SODIUM, URINE, RANDOM  LACTIC ACID, PLASMA  LACTIC ACID, PLASMA  PROCALCITONIN  BASIC METABOLIC PANEL  CBC  I-STAT CG4 LACTIC ACID, ED    EKG None  Radiology Dg Chest Portable 1 View  Result Date: 11/09/2017 CLINICAL DATA:  Sepsis. EXAM: PORTABLE CHEST 1 VIEW COMPARISON:  Chest x-ray dated August 15, 2017. FINDINGS: The patient is rotated to the right. Cardiomegaly. Tortuous, aneurysmal thoracic aorta, better evaluated on prior CT. Normal pulmonary vascularity. Streaky atelectasis in the right mid lung. No focal consolidation, pleural effusion, or pneumothorax. No acute osseous abnormality. IMPRESSION: 1. Limited evaluation due to patient rotation.  No active disease. Electronically Signed   By: Obie Dredge M.D.   On: 11/09/2017 22:13    Procedures Procedures (including critical care time)  Medications Ordered in ED Medications  acetaminophen (TYLENOL) suppository 650 mg (has no administration in time range)  sodium chloride 0.9 % bolus 1,000 mL (1,000 mLs Intravenous New Bag/Given 11/10/17 0030)  dextrose 5 %-0.45 % sodium chloride infusion (has no administration in time range)  ondansetron (ZOFRAN) injection 4 mg (has no administration in time range)  hydrALAZINE (APRESOLINE) injection 5 mg (has no administration in time range)  metoprolol tartrate (LOPRESSOR) injection 5 mg (has no administration in time range)  enoxaparin (LOVENOX) injection 40 mg (has no administration in time range)  vancomycin (VANCOCIN) 500 mg in sodium chloride 0.9 % 100 mL IVPB (has no administration in time range)  piperacillin-tazobactam (ZOSYN) IVPB 2.25 g (has no administration in time range)  acetaminophen (TYLENOL) suppository 650 mg (650 mg Rectal  Given 11/09/17 2147)  piperacillin-tazobactam (ZOSYN) IVPB 3.375 g (0 g Intravenous Stopped 11/09/17 2357)  sodium chloride 0.9 % bolus 1,000 mL (0 mLs Intravenous Stopped 11/10/17 0107)  vancomycin (VANCOCIN) IVPB 1000 mg/200 mL premix (0 mg Intravenous Stopped 11/10/17 0028)     Initial Impression / Assessment and Plan / ED Course  I have reviewed the triage vital signs and the nursing notes.  Pertinent labs & imaging results that were available during my care of the patient were reviewed by me and considered in my medical decision making (see chart for details).     Patient here for evaluation of fever, increase lethargy compared to her baseline. Patient is ill appearing on examination, unresponsive. She is on hospice for advanced dementia. Patients family was available for discussion after initial ED evaluation. They state that she does not want invasive measures but she would want IV fluids and IV antibiotics if needed. Discussed with family concern for sepsis with severe dehydration and critical nature of illness. Hospitalist consulted for admission for further treatment.  Final Clinical Impressions(s) / ED Diagnoses   Final diagnoses:  None    ED Discharge Orders    None       Tilden Fossa, MD 11/10/17 0121

## 2017-11-09 NOTE — Progress Notes (Signed)
    NURSING HOME LOCATION:  [facility] ROOM NUMBER:  [id number]  CODE STATUS:  DNR  PCP:  Pecola LawlessHopper, Onita Pfluger F, MD  [address] [place] [id number]  This is a nursing facility follow up of chronic medical diagnoses and end of life issues.  Interim medical record and care since [facility] visit was updated with review of diagnostic studies and change in clinical status since last visit were documented.  HPI: The patient is a permanent resident of facility. She has diagnoses of Alzheimer's dementia and history of prior stroke. No recent CNS imaging is present in Epic. CT of the head 05/20/12 compared to 12/25/11 CT revealed generalized atrophy with small vessel chronic ischemic changes in the deep cerebral white matter. There was evidence of an old posterior left parietal infarct. 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  Hospice has been following the patient for palliative/supportive care. The husband has been having great difficulty with this final stage of her life. He called and requested IVs be started. There had been no significant clinical change in the patient's status. Specifically there are no signs or symptoms of pain or distress. O2 sats was 95% on room air. Respiratory rate  was 20. She did have tachycardia with a heart rate of 116. The patient was unresponsive to noxious stimuli. Husband's main concern was her decreased intake and this was the reason he requested IV fluids. Hospice physician did order normal saline at 40 mL per hour 1 L. When I came to assess the patient her husband and their son, Virl DiamondChuck were present. I explained that the IV fluids would not address any nutritional issues nor would it address the process which is progressive end-stage microvascular disease. That attempting to feed her with her present level of consciousness would result in aspiration pneumonia was explained to them.  The son is actually comfortable with the present situation having read the book Being Mortal by Dr Vivi FernsAtul Gawande at the time of his father-in-law's passing.  Physical exam:  Pertinent or positive findings: Patient is unresponsive. She fails to respond to pressure in the supraorbital area. There is disconjugate gaze when the eyelids are lifted passively. Oral cavity cannot be examined. She did exhibit some intermittent tremor of the jaw. She does exhibit a resting tachycardia without significant murmur. Breath sounds are decreased. Bowel sounds are also decreased. Pedal pulses are decreased, especially posterior tibial pulses. There is no spontaneous activity of limbs. The arms are flexed across her upper abdomen. General appearance: Surprisingly she appears adequately nourished.   Lymphatic: No lymphadenopathy about the head, neck, axilla. Eyes: No conjunctival inflammation or lid edema is present. There is no scleral icterus. Ears:  External ear exam shows no significant lesions or deformities.   Nose:  External nasal examination shows no deformity or inflammation. Nasal mucosa are pink and moist without lesions, exudates Neck:  No thyromegaly, masses, tenderness noted.    Heart:  No gallop, click, rub .  Lungs:without wheezes, rhonchi,rales , rubs. Abdomen: Abdomen is soft and nontender with no organomegaly, hernias,masses. GU: deferred  Extremities:  No cyanosis, clubbing,edema  Skin: Warm & dry  No significant lesions or rash.  See summary under each active problem in the Problem List with associated updated therapeutic plan

## 2017-11-09 NOTE — Patient Instructions (Signed)
See assessment and plan under each diagnosis in the problem list and acutely for this visit Total time 32 minutes; greater than 50% of the visit spent counseling patient's husband & son  and coordinating care for problems addressed at this encounter

## 2017-11-10 ENCOUNTER — Other Ambulatory Visit: Payer: Self-pay

## 2017-11-10 DIAGNOSIS — N179 Acute kidney failure, unspecified: Secondary | ICD-10-CM | POA: Diagnosis not present

## 2017-11-10 DIAGNOSIS — Z8673 Personal history of transient ischemic attack (TIA), and cerebral infarction without residual deficits: Secondary | ICD-10-CM | POA: Diagnosis not present

## 2017-11-10 DIAGNOSIS — G9341 Metabolic encephalopathy: Secondary | ICD-10-CM | POA: Diagnosis present

## 2017-11-10 DIAGNOSIS — Z6821 Body mass index (BMI) 21.0-21.9, adult: Secondary | ICD-10-CM | POA: Diagnosis not present

## 2017-11-10 DIAGNOSIS — R0682 Tachypnea, not elsewhere classified: Secondary | ICD-10-CM | POA: Diagnosis present

## 2017-11-10 DIAGNOSIS — R1310 Dysphagia, unspecified: Secondary | ICD-10-CM | POA: Diagnosis not present

## 2017-11-10 DIAGNOSIS — Z515 Encounter for palliative care: Secondary | ICD-10-CM | POA: Diagnosis present

## 2017-11-10 DIAGNOSIS — M109 Gout, unspecified: Secondary | ICD-10-CM | POA: Diagnosis not present

## 2017-11-10 DIAGNOSIS — E870 Hyperosmolality and hypernatremia: Secondary | ICD-10-CM | POA: Diagnosis not present

## 2017-11-10 DIAGNOSIS — I129 Hypertensive chronic kidney disease with stage 1 through stage 4 chronic kidney disease, or unspecified chronic kidney disease: Secondary | ICD-10-CM | POA: Diagnosis present

## 2017-11-10 DIAGNOSIS — R6510 Systemic inflammatory response syndrome (SIRS) of non-infectious origin without acute organ dysfunction: Secondary | ICD-10-CM

## 2017-11-10 DIAGNOSIS — I679 Cerebrovascular disease, unspecified: Secondary | ICD-10-CM | POA: Diagnosis not present

## 2017-11-10 DIAGNOSIS — G309 Alzheimer's disease, unspecified: Secondary | ICD-10-CM | POA: Diagnosis not present

## 2017-11-10 DIAGNOSIS — R6511 Systemic inflammatory response syndrome (SIRS) of non-infectious origin with acute organ dysfunction: Secondary | ICD-10-CM | POA: Diagnosis present

## 2017-11-10 DIAGNOSIS — N183 Chronic kidney disease, stage 3 (moderate): Secondary | ICD-10-CM | POA: Diagnosis not present

## 2017-11-10 DIAGNOSIS — Z87891 Personal history of nicotine dependence: Secondary | ICD-10-CM | POA: Diagnosis not present

## 2017-11-10 DIAGNOSIS — I10 Essential (primary) hypertension: Secondary | ICD-10-CM | POA: Diagnosis not present

## 2017-11-10 DIAGNOSIS — L8990 Pressure ulcer of unspecified site, unspecified stage: Secondary | ICD-10-CM | POA: Diagnosis not present

## 2017-11-10 DIAGNOSIS — A419 Sepsis, unspecified organism: Secondary | ICD-10-CM | POA: Diagnosis present

## 2017-11-10 DIAGNOSIS — R509 Fever, unspecified: Secondary | ICD-10-CM | POA: Diagnosis present

## 2017-11-10 DIAGNOSIS — F0280 Dementia in other diseases classified elsewhere without behavioral disturbance: Secondary | ICD-10-CM | POA: Diagnosis present

## 2017-11-10 DIAGNOSIS — D631 Anemia in chronic kidney disease: Secondary | ICD-10-CM | POA: Diagnosis present

## 2017-11-10 DIAGNOSIS — R000 Tachycardia, unspecified: Secondary | ICD-10-CM | POA: Diagnosis present

## 2017-11-10 DIAGNOSIS — Z8679 Personal history of other diseases of the circulatory system: Secondary | ICD-10-CM | POA: Diagnosis not present

## 2017-11-10 DIAGNOSIS — E785 Hyperlipidemia, unspecified: Secondary | ICD-10-CM | POA: Diagnosis not present

## 2017-11-10 DIAGNOSIS — E860 Dehydration: Secondary | ICD-10-CM | POA: Diagnosis present

## 2017-11-10 DIAGNOSIS — M2450 Contracture, unspecified joint: Secondary | ICD-10-CM | POA: Diagnosis not present

## 2017-11-10 DIAGNOSIS — L89152 Pressure ulcer of sacral region, stage 2: Secondary | ICD-10-CM | POA: Diagnosis present

## 2017-11-10 DIAGNOSIS — Z66 Do not resuscitate: Secondary | ICD-10-CM | POA: Diagnosis present

## 2017-11-10 DIAGNOSIS — J309 Allergic rhinitis, unspecified: Secondary | ICD-10-CM | POA: Diagnosis not present

## 2017-11-10 DIAGNOSIS — E440 Moderate protein-calorie malnutrition: Secondary | ICD-10-CM | POA: Diagnosis not present

## 2017-11-10 LAB — BASIC METABOLIC PANEL
ANION GAP: 11 (ref 5–15)
Anion gap: 10 (ref 5–15)
Anion gap: 10 (ref 5–15)
BUN: 40 mg/dL — ABNORMAL HIGH (ref 6–20)
BUN: 34 mg/dL — AB (ref 6–20)
BUN: 31 mg/dL — ABNORMAL HIGH (ref 6–20)
CALCIUM: 8.4 mg/dL — AB (ref 8.9–10.3)
CALCIUM: 8.4 mg/dL — AB (ref 8.9–10.3)
CALCIUM: 8.1 mg/dL — AB (ref 8.9–10.3)
CHLORIDE: 128 mmol/L — AB (ref 101–111)
CO2: 18 mmol/L — ABNORMAL LOW (ref 22–32)
CO2: 18 mmol/L — AB (ref 22–32)
CO2: 16 mmol/L — AB (ref 22–32)
CREATININE: 1.68 mg/dL — AB (ref 0.44–1.00)
CREATININE: 1.62 mg/dL — AB (ref 0.44–1.00)
Chloride: 128 mmol/L — ABNORMAL HIGH (ref 101–111)
Chloride: 126 mmol/L — ABNORMAL HIGH (ref 101–111)
Creatinine, Ser: 1.94 mg/dL — ABNORMAL HIGH (ref 0.44–1.00)
GFR calc Af Amer: 28 mL/min — ABNORMAL LOW (ref >60)
GFR calc Af Amer: 33 mL/min — ABNORMAL LOW (ref >60)
GFR calc Af Amer: 35 mL/min — ABNORMAL LOW (ref >60)
GFR calc non Af Amer: 24 mL/min — ABNORMAL LOW (ref >60)
GFR calc non Af Amer: 29 mL/min — ABNORMAL LOW (ref >60)
GFR calc non Af Amer: 30 mL/min — ABNORMAL LOW (ref >60)
GLUCOSE: 128 mg/dL — AB (ref 65–99)
GLUCOSE: 115 mg/dL — AB (ref 65–99)
Glucose, Bld: 138 mg/dL — ABNORMAL HIGH (ref 65–99)
Potassium: 3.5 mmol/L (ref 3.5–5.1)
Potassium: 4 mmol/L (ref 3.5–5.1)
Potassium: 4 mmol/L (ref 3.5–5.1)
SODIUM: 152 mmol/L — AB (ref 135–145)
Sodium: 157 mmol/L — ABNORMAL HIGH (ref 135–145)
Sodium: 156 mmol/L — ABNORMAL HIGH (ref 135–145)

## 2017-11-10 LAB — CREATININE, URINE, RANDOM: CREATININE, URINE: 161.89 mg/dL

## 2017-11-10 LAB — CBC
HCT: 39.5 % (ref 36.0–46.0)
HEMOGLOBIN: 12.2 g/dL (ref 12.0–15.0)
MCH: 29.7 pg (ref 26.0–34.0)
MCHC: 30.9 g/dL (ref 30.0–36.0)
MCV: 96.1 fL (ref 78.0–100.0)
Platelets: 284 10*3/uL (ref 150–400)
RBC: 4.11 MIL/uL (ref 3.87–5.11)
RDW: 14.3 % (ref 11.5–15.5)
WBC: 22.5 10*3/uL — ABNORMAL HIGH (ref 4.0–10.5)

## 2017-11-10 LAB — SODIUM, URINE, RANDOM: SODIUM UR: 19 mmol/L

## 2017-11-10 LAB — LACTIC ACID, PLASMA
Lactic Acid, Venous: 1.2 mmol/L (ref 0.5–1.9)
Lactic Acid, Venous: 1.5 mmol/L (ref 0.5–1.9)

## 2017-11-10 LAB — OSMOLALITY, URINE: OSMOLALITY UR: 577 mosm/kg (ref 300–900)

## 2017-11-10 LAB — PROCALCITONIN: PROCALCITONIN: 1.8 ng/mL

## 2017-11-10 LAB — GLUCOSE, CAPILLARY: Glucose-Capillary: 142 mg/dL — ABNORMAL HIGH (ref 65–99)

## 2017-11-10 LAB — MRSA PCR SCREENING: MRSA BY PCR: NEGATIVE

## 2017-11-10 MED ORDER — METOPROLOL TARTRATE 5 MG/5ML IV SOLN
5.0000 mg | Freq: Three times a day (TID) | INTRAVENOUS | Status: DC
  Administered 2017-11-10 – 2017-11-13 (×11): 5 mg via INTRAVENOUS
  Filled 2017-11-10 (×11): qty 5

## 2017-11-10 MED ORDER — ACETAMINOPHEN 650 MG RE SUPP: 650.0000 mg | Freq: Four times a day (QID) | RECTAL | Status: DC | PRN

## 2017-11-10 MED ORDER — PIPERACILLIN-TAZOBACTAM IN DEX 2-0.25 GM/50ML IV SOLN
2.2500 g | Freq: Three times a day (TID) | INTRAVENOUS | Status: DC
  Administered 2017-11-10 – 2017-11-12 (×8): 2.25 g via INTRAVENOUS
  Filled 2017-11-10 (×11): qty 50

## 2017-11-10 MED ORDER — DEXTROSE-NACL 5-0.45 % IV SOLN
INTRAVENOUS | Status: DC
  Administered 2017-11-10 (×2): via INTRAVENOUS

## 2017-11-10 MED ORDER — HYDRALAZINE HCL 20 MG/ML IJ SOLN
5.0000 mg | INTRAMUSCULAR | Status: DC | PRN
  Administered 2017-11-10: 5 mg via INTRAVENOUS
  Filled 2017-11-10: qty 1

## 2017-11-10 MED ORDER — ONDANSETRON HCL 4 MG/2ML IJ SOLN: 4.0000 mg | Freq: Three times a day (TID) | INTRAMUSCULAR | Status: DC | PRN

## 2017-11-10 MED ORDER — VANCOMYCIN HCL 500 MG IV SOLR: 500.0000 mg | INTRAVENOUS | Status: DC

## 2017-11-10 MED ORDER — SODIUM CHLORIDE 0.9 % IV BOLUS
1000.0000 mL | Freq: Once | INTRAVENOUS | Status: AC
  Administered 2017-11-10: 1000 mL via INTRAVENOUS

## 2017-11-10 MED ORDER — ENOXAPARIN SODIUM 30 MG/0.3ML ~~LOC~~ SOLN
30.0000 mg | SUBCUTANEOUS | Status: DC
  Administered 2017-11-10 – 2017-11-13 (×4): 30 mg via SUBCUTANEOUS
  Filled 2017-11-10 (×3): qty 0.3

## 2017-11-10 NOTE — Progress Notes (Signed)
Initial Nutrition Assessment  DOCUMENTATION CODES:   Non-severe (moderate) malnutrition in context of chronic illness  INTERVENTION:   -RD will follow diet and advancement and goals of care and add supplements/further recommendations as appropriate  NUTRITION DIAGNOSIS:   Moderate Malnutrition related to chronic illness(dementia) as evidenced by mild fat depletion, moderate fat depletion, mild muscle depletion, moderate muscle depletion, percent weight loss.  GOAL:   Patient will meet greater than or equal to 90% of their needs  MONITOR:   PO intake, Supplement acceptance, Diet advancement, Labs, Weight trends, Skin, I & O's  REASON FOR ASSESSMENT:   Low Braden    ASSESSMENT:   Amanda [name] is a 72 y.o. female with a history of stroke, hypertension, dementia, gout, s/p aortic aneurysm repair. She presents secondary to fever and altered mental status. She is currently under hospice care. She is being treated for SIRS of unknown source. She also has hypernatremia.  Pt admitted with AMS.   Pt arose minimally to touch; did not respond to voice or name being called. No family or caregivers present at time of visit.   Pt is an active hospice pt. No SNF records available in shadow chart.   Reviewed wt hx; noted pt has experienced a 17% wt loss over the past 3 months, which is significant for time frame.  Palliative care consult pending.   Labs reviewed: Na: 156, CBGS: 142.   NUTRITION - FOCUSED PHYSICAL EXAM:    Most Recent Value  Orbital Region  No depletion  Upper Arm Region  Mild depletion  Thoracic and Lumbar Region  Unable to assess  Buccal Region  No depletion  Temple Region  No depletion  Clavicle Bone Region  No depletion  Clavicle and Acromion Bone Region  No depletion  Scapular Bone Region  Mild depletion  Dorsal Hand  Mild depletion  Patellar Region  Moderate depletion  Anterior Thigh Region  Moderate depletion  Posterior Calf Region  Moderate  depletion  Edema (RD Assessment)  None  Hair  Reviewed  Eyes  Reviewed  Mouth  Reviewed  Skin  Reviewed       Diet Order:  Diet NPO time specified  EDUCATION NEEDS:   Not appropriate for education at this time  Skin:  Skin Assessment: Skin Integrity Issues: Skin Integrity Issues:: Stage II Stage II: sacrum  Last BM:  PTA  Height:   Ht Readings from Last 1 Encounters:  11/10/17 5\' 1"  (1.549 m)    Weight:   Wt Readings from Last 1 Encounters:  11/10/17 112 lb 14 oz (51.2 kg)    Ideal Body Weight:  47.7 kg  BMI:  Body mass index is 21.33 kg/m.  Estimated Nutritional Needs:   Kcal:  1500-1700  Protein:  65-80 grams  Fluid:  > 1.5 L    Oluwatimilehin Balfour A. Mayford KnifeWilliams, RD, LDN, CDE Pager: [phone] After hours Pager: [phone]

## 2017-11-10 NOTE — Progress Notes (Signed)
PROGRESS NOTE    Amanda [name]  ZOX:[id number] DOB: 30-Oct-1945 DOA: 11/09/2017 PCP: Pecola LawlessHopper, William F, MD   Brief Narrative: Amanda [name] is a 72 y.o. female with a history of stroke, hypertension, dementia, gout, s/p aortic aneurysm repair. She presents secondary to fever and altered mental status. She is currently under hospice care. She is being treated for SIRS of unknown source. She also has hypernatremia.   Assessment & Plan:   Principal Problem:   Sepsis (HCC) Active Problems:   Essential hypertension, benign   Acute renal failure superimposed on stage 3 chronic kidney disease (HCC)   DNR (do not resuscitate)   Hospice care   Hypernatremia   Acute metabolic encephalopathy   SIRS Unknown source. Leukocytosis slightly improved. Urinalysis did not suggest UTI. No urine culture obtained. -Continue Vancomycin/Zosyn -Blood cultures pending  Acute metabolic encephalopathy Secondary to infection/hypernatremia. Family refused CT scan  Hypernatremia Likely secondary to decreased oral intake. Improved slightly. -Continue D5 1/2 NS -Repeat BMP q6 hours  Acute kidney injury on CKD 3 Secondary to dehydration. Improving with IV fluids -IV fluids as mentioned above.  Essential hypertension Antihypertensives held on admission secondary to sepsis. Hypertensive today. -If continues to be hypertensive, will restart home clonidine   DVT prophylaxis: Lovenox Code Status:   Code Status: DNR Family Communication: Called husband (voice mail) Disposition Plan: Discharge to SNF when infection treated   Consultants:   None  Procedures:   None  Antimicrobials:  Vancomycin (4/2>>  Zosyn (4/2>>    Subjective: Patient is mumbling to herself.  Objective: Vitals:   11/10/17 0152 11/10/17 0214 11/10/17 0215 11/10/17 0844  BP:   (!) 154/93 (!) 165/99  Pulse:   100 88  Resp:   (!) 26 (!) 22  Temp: 98.1 F (36.7 C) 97.8 F (36.6 C)    TempSrc: Axillary  Axillary    SpO2:    100%  Weight:  51.2 kg (112 lb 14 oz)    Height:  5\' 1"  (1.549 m)      Intake/Output Summary (Last 24 hours) at 11/10/2017 1154 Last data filed at 11/10/2017 0847 Gross per 24 hour  Intake 2250 ml  Output 0 ml  Net 2250 ml   Filed Weights   11/09/17 2144 11/10/17 0214  Weight: 52.9 kg (116 lb 10 oz) 51.2 kg (112 lb 14 oz)    Examination:  General exam: Appears calm and comfortable Respiratory system: Clear but diminished to auscultation. Respiratory effort normal. Cardiovascular system: S1 & S2 heard, RRR. No murmurs, rubs, gallops or clicks. Gastrointestinal system: Abdomen is nondistended, soft and nontender. No organomegaly or masses felt. Normal bowel sounds heard. Central nervous system: Somnolent but responds tactile stimuli. Extremities: No edema. No calf tenderness Skin: No cyanosis. No rashes Psychiatry: Judgement and insight impaired.    Data Reviewed: I have personally reviewed following labs and imaging studies  CBC: Recent Labs  Lab 11/09/17 2205 11/10/17 0242  WBC 25.4* 22.5*  NEUTROABS 21.4*  --   HGB 13.0 12.2  HCT 41.2 39.5  MCV 95.6 96.1  PLT 328 284   Basic Metabolic Panel: Recent Labs  Lab 11/09/17 2205 11/10/17 0242  NA 159* 157*  K 3.6 3.5  CL 128* 128*  CO2 19* 18*  GLUCOSE 119* 138*  BUN 43* 40*  CREATININE 2.09* 1.94*  CALCIUM 9.0 8.4*   GFR: Estimated Creatinine Clearance: 19.2 mL/min (A) (by C-G formula based on SCr of 1.94 mg/dL (H)). Liver Function Tests: Recent Labs  Lab  11/09/17 2205  AST 19  ALT 14  ALKPHOS 65  BILITOT 1.0  PROT 7.9  ALBUMIN 2.9*   No results for input(s): LIPASE, AMYLASE in the last 168 hours. No results for input(s): AMMONIA in the last 168 hours. Coagulation Profile: Recent Labs  Lab 11/09/17 2205  INR 1.21   Cardiac Enzymes: No results for input(s): CKTOTAL, CKMB, CKMBINDEX, TROPONINI in the last 168 hours. BNP (last 3 results) No results for input(s): PROBNP in the  last 8760 hours. HbA1C: No results for input(s): HGBA1C in the last 72 hours. CBG: Recent Labs  Lab 11/10/17 0846  GLUCAP 142*   Lipid Profile: No results for input(s): CHOL, HDL, LDLCALC, TRIG, CHOLHDL, LDLDIRECT in the last 72 hours. Thyroid Function Tests: No results for input(s): TSH, T4TOTAL, FREET4, T3FREE, THYROIDAB in the last 72 hours. Anemia Panel: No results for input(s): VITAMINB12, FOLATE, FERRITIN, TIBC, IRON, RETICCTPCT in the last 72 hours. Sepsis Labs: Recent Labs  Lab 11/09/17 2230 11/10/17 0242 11/10/17 0637  PROCALCITON  --  1.80  --   LATICACIDVEN 1.37 1.2 1.5    Recent Results (from the past 240 hour(s))  Culture, blood (Routine x 2)     Status: None (Preliminary result)   Collection Time: 11/09/17 10:07 PM  Result Value Ref Range Status   Specimen Description BLOOD LEFT HAND  Final   Special Requests IN PEDIATRIC BOTTLE Blood Culture adequate volume  Final   Culture   Final    NO GROWTH < 12 HOURS Performed at [facility], [address] [place], [place] [address]    Report Status PENDING  Incomplete  Culture, blood (Routine x 2)     Status: None (Preliminary result)   Collection Time: 11/09/17 10:07 PM  Result Value Ref Range Status   Specimen Description BLOOD RIGHT WRIST  Final   Special Requests   Final    BOTTLES DRAWN AEROBIC AND ANAEROBIC Blood Culture adequate volume   Culture   Final    NO GROWTH < 12 HOURS Performed at [facility], [address], [place], [place] [address]    Report Status PENDING  Incomplete  MRSA PCR Screening     Status: None   Collection Time: 11/10/17  2:09 AM  Result Value Ref Range Status   MRSA by PCR NEGATIVE NEGATIVE Final    Comment:        The GeneXpert MRSA Assay (FDA approved for NASAL specimens only), is one component of a comprehensive MRSA colonization surveillance program. It is not intended to diagnose MRSA infection nor to guide or monitor treatment for MRSA  infections. Performed at [facility], [address], [place], [place] [address]          Radiology Studies: Dg Chest Portable 1 View  Result Date: 11/09/2017 CLINICAL DATA:  Sepsis. EXAM: PORTABLE CHEST 1 VIEW COMPARISON:  Chest x-ray dated August 15, 2017. FINDINGS: The patient is rotated to the right. Cardiomegaly. Tortuous, aneurysmal thoracic aorta, better evaluated on prior CT. Normal pulmonary vascularity. Streaky atelectasis in the right mid lung. No focal consolidation, pleural effusion, or pneumothorax. No acute osseous abnormality. IMPRESSION: 1. Limited evaluation due to patient rotation.  No active disease. Electronically Signed   By: Obie Dredge M.D.   On: 11/09/2017 22:13        Scheduled Meds: . enoxaparin (LOVENOX) injection  30 mg Subcutaneous Q24H  . metoprolol tartrate  5 mg Intravenous Q8H   Continuous Infusions: . dextrose 5 %  and 0.45% NaCl 100 mL/hr at 11/10/17 0258  . piperacillin-tazobactam (ZOSYN)  IV Stopped (11/10/17 0755)  . [START ON 11/12/2017] vancomycin       LOS: 0 days     Jacquelin Hawking, MD [facility] 11/10/2017, 11:54 AM Pager: [phone]  If 7PM-7AM, please contact night-coverage [email or web address] Password [facility] 11/10/2017, 11:54 AM

## 2017-11-10 NOTE — Progress Notes (Signed)
Pharmacy Antibiotic Note  Kurstin P Clinton SawyerWilliamson is a 72 y.o. female admitted on 11/09/2017 with AMS/fevefrs/sepsis.  Pharmacy has been consulted for Vancomycin and Zosyn dosing.  Vancomycin 1 g IV given in ED at 2330  Plan: Vancomycin 500 mg IV q48h Zosyn 2.25 g IV q8h  Weight: 116 lb 10 oz (52.9 kg)  Temp (24hrs), Avg:99.3 F (37.4 C), Min:97 F (36.1 C), Max:101.5 F (38.6 C)  Recent Labs  Lab 11/09/17 2205 11/09/17 2230  WBC 25.4*  --   CREATININE 2.09*  --   LATICACIDVEN  --  1.37    Estimated Creatinine Clearance: 17.8 mL/min (A) (by C-G formula based on SCr of 2.09 mg/dL (H)).    Allergies  Allergen Reactions  . Pollen Extract Other (See Comments)    rhinitis    Eddie Candlebbott, Tiawanna Luchsinger [place] 11/10/2017 12:31 AM

## 2017-11-10 NOTE — Progress Notes (Signed)
[facility] 2C 09 - [facility]) RN visit at 2:00pm   This is a related and covered hospital admission of 11/09/17 with a [facility] diagnosis of Alzheimer's Disease per Dr. Smith MinceMazzocchi of [facility].  [facility] after developing fever of 103 degrees and altered mental status changes on 11/09/17.  Family is requesting IV fluids and IV antibiotics if needed but no invasive measures.    [facility].  [facility] LOS 1  Internal Medicine Notes on 11/10/17 SIR: Unknown source. Leukocytosis slightly improved. Urinalysis did not suggest UTI. No urine culture obtained. Continue Vancomycin/Zosyn. Blood cultures pending Acute Metabolic Encephalopathy: Secondary to infection/hypernatremia. Family refused CT scan Hypernatremia:likely secondary to decreased oral intake. Improved slightly.Continue D5 1/2 NS.Repeat BMP q6 hours Acute Kidney Injury on CKD 3:Secondary to dehydration. Improving with IV fluids Essential HTN: Antihypertensives held on admission secondary to sepsis. Hypertensive today. If continues to be hypertensive, will restart home clonidine  Medications: Patient on Lovenox 30mg  inj QD, Lopressor 5mg  inj q8hr, D50.45%NS infusing at 13500ml/hr, Zosyn 2.25g IVPB q8hr IV, Vancomycin 500mg  in NS 100ml IVPB q48 hr IV, with no PRN medications administered today.  Per [facility] Bedside RN, ENE: Patient experiencing no fever today, remains lethargic at this time.   Visited patient with son Virl Diamond(Chuck) at bedside. Patient son states "mother remains slightly restless but color better this afternoon". Patient resting quietly in bed, mumbles and occasionally opens eyes to tactile stimuli but unable to communicate/folllow commands.  Patient in NAD, on room air with no s/s of discomfort.  Confirmed that son has [facility] contact information, encouraged to call if assistance needed and informed son that [facility] will continue to follow daily  while in hospital.    Patient has OOF DNR, Hospice will continue to support with EOL care and anticipate discharge needs.   [facility] team including SW and RN made aware of patient admission and condition. Primary care physician Dr. Grayce SessionsMedina-Vargas office also notified of patient admission.   [facility] Transfer summary and current medication list has been placed on hospital shadow chart.  Please call with any hospice related questions.  Thank you,  Roda ShuttersSherry Gibson, RN [facility] Hospital Liaison [phone]  [facility] Hospital Liaisons now found on AMION

## 2017-11-11 ENCOUNTER — Inpatient Hospital Stay (HOSPITAL_COMMUNITY)

## 2017-11-11 DIAGNOSIS — E440 Moderate protein-calorie malnutrition: Secondary | ICD-10-CM

## 2017-11-11 LAB — BASIC METABOLIC PANEL
ANION GAP: 8 (ref 5–15)
ANION GAP: 10 (ref 5–15)
ANION GAP: 13 (ref 5–15)
Anion gap: 11 (ref 5–15)
BUN: 19 mg/dL (ref 6–20)
BUN: 22 mg/dL — ABNORMAL HIGH (ref 6–20)
BUN: 22 mg/dL — ABNORMAL HIGH (ref 6–20)
BUN: 26 mg/dL — ABNORMAL HIGH (ref 6–20)
CALCIUM: 8 mg/dL — AB (ref 8.9–10.3)
CALCIUM: 8 mg/dL — AB (ref 8.9–10.3)
CALCIUM: 8.2 mg/dL — AB (ref 8.9–10.3)
CALCIUM: 8.4 mg/dL — AB (ref 8.9–10.3)
CO2: 17 mmol/L — ABNORMAL LOW (ref 22–32)
CO2: 17 mmol/L — ABNORMAL LOW (ref 22–32)
CO2: 18 mmol/L — ABNORMAL LOW (ref 22–32)
CO2: 19 mmol/L — ABNORMAL LOW (ref 22–32)
Chloride: 122 mmol/L — ABNORMAL HIGH (ref 101–111)
Chloride: 122 mmol/L — ABNORMAL HIGH (ref 101–111)
Chloride: 123 mmol/L — ABNORMAL HIGH (ref 101–111)
Chloride: 125 mmol/L — ABNORMAL HIGH (ref 101–111)
Creatinine, Ser: 1.31 mg/dL — ABNORMAL HIGH (ref 0.44–1.00)
Creatinine, Ser: 1.35 mg/dL — ABNORMAL HIGH (ref 0.44–1.00)
Creatinine, Ser: 1.38 mg/dL — ABNORMAL HIGH (ref 0.44–1.00)
Creatinine, Ser: 1.5 mg/dL — ABNORMAL HIGH (ref 0.44–1.00)
GFR calc Af Amer: 44 mL/min — ABNORMAL LOW (ref >60)
GFR calc Af Amer: 42 mL/min — ABNORMAL LOW (ref >60)
GFR, EST AFRICAN AMERICAN: 38 mL/min — AB (ref >60)
GFR, EST AFRICAN AMERICAN: 45 mL/min — AB (ref >60)
GFR, EST NON AFRICAN AMERICAN: 33 mL/min — AB (ref >60)
GFR, EST NON AFRICAN AMERICAN: 37 mL/min — AB (ref >60)
GFR, EST NON AFRICAN AMERICAN: 38 mL/min — AB (ref >60)
GFR, EST NON AFRICAN AMERICAN: 39 mL/min — AB (ref >60)
GLUCOSE: 103 mg/dL — AB (ref 65–99)
GLUCOSE: 112 mg/dL — AB (ref 65–99)
GLUCOSE: 121 mg/dL — AB (ref 65–99)
GLUCOSE: 124 mg/dL — AB (ref 65–99)
POTASSIUM: 3.4 mmol/L — AB (ref 3.5–5.1)
POTASSIUM: 3.1 mmol/L — AB (ref 3.5–5.1)
Potassium: 3.5 mmol/L (ref 3.5–5.1)
Potassium: 2.7 mmol/L — CL (ref 3.5–5.1)
SODIUM: 150 mmol/L — AB (ref 135–145)
SODIUM: 151 mmol/L — AB (ref 135–145)
SODIUM: 152 mmol/L — AB (ref 135–145)
SODIUM: 152 mmol/L — AB (ref 135–145)

## 2017-11-11 LAB — CBC
HCT: 37 % (ref 36.0–46.0)
Hemoglobin: 11.4 g/dL — ABNORMAL LOW (ref 12.0–15.0)
MCH: 29.5 pg (ref 26.0–34.0)
MCHC: 30.8 g/dL (ref 30.0–36.0)
MCV: 95.6 fL (ref 78.0–100.0)
Platelets: 275 10*3/uL (ref 150–400)
RBC: 3.87 MIL/uL (ref 3.87–5.11)
RDW: 14.5 % (ref 11.5–15.5)
WBC: 15.1 10*3/uL — AB (ref 4.0–10.5)

## 2017-11-11 LAB — MAGNESIUM: Magnesium: 1.8 mg/dL (ref 1.7–2.4)

## 2017-11-11 LAB — GLUCOSE, CAPILLARY: GLUCOSE-CAPILLARY: 122 mg/dL — AB (ref 65–99)

## 2017-11-11 MED ORDER — DEXTROSE 5 % IV SOLN
INTRAVENOUS | Status: DC
  Administered 2017-11-11: 10:00:00 via INTRAVENOUS

## 2017-11-11 MED ORDER — POTASSIUM CHLORIDE 10 MEQ/100ML IV SOLN
10.0000 meq | INTRAVENOUS | Status: AC
  Administered 2017-11-11 – 2017-11-12 (×6): 10 meq via INTRAVENOUS
  Filled 2017-11-11 (×6): qty 100

## 2017-11-11 MED ORDER — VANCOMYCIN HCL 500 MG IV SOLR
500.0000 mg | INTRAVENOUS | Status: DC
  Administered 2017-11-11 – 2017-11-12 (×2): 500 mg via INTRAVENOUS
  Filled 2017-11-11 (×3): qty 500

## 2017-11-11 MED ORDER — DEXTROSE-NACL 5-0.45 % IV SOLN
INTRAVENOUS | Status: DC
  Administered 2017-11-11 – 2017-11-13 (×4): via INTRAVENOUS

## 2017-11-11 NOTE — Progress Notes (Signed)
[facility] 2[facility] 09 - [facility] Pall[facility] of  ([facility]) RN visit at 11:15.  This is a related and covered hospital admission of 11/09/17 with a [facility] diagnosis of Alzheimer's Disease per Dr. Smith MinceMazzocchi of [facility].  [facility] after developing fever of 103 degrees and altered mental status changes on 11/09/17.  Family is requesting IV fluids and IV antibiotics if needed but no invasive measures.    [facility].  [facility] LOS 2  Internal Medicine Notes on 11/11/17 SIRS  Unknown source. Leukocytosis slightly improved. Urinalysis did not suggest UTI. No urine culture obtained.  -Continue Vancomycin/Zosyn  -Blood cultures pending (no growth to date)  Acute metabolic encephalopathy  Secondary to infection/hypernatremia. Family refused CT scan on admission. Improved per discussion with husband.  Hypernatremia  Likely secondary to decreased oral intake. Improved slightly.  -Continue D5 1/2 NS  -Repeat BMP q6 hours  Acute kidney injury on CKD 3  Secondary to dehydration. Improving with IV fluids  -IV fluids as mentioned above.  Essential hypertension  Antihypertensives held on admission secondary to sepsis. Hypertensive today.  -If continues to be hypertensive, will restart home clonidine  Moderate malnutrition  Pressure ulcer, sacral stage 2  Present prior to arrival  -Sacral x-ray  Medications: Patient on Lovenox 30mg  inj QD, Lopressor 5mg  inj q8hr, D50.45%NS infusing at 13400ml/hr, Zosyn 2.25g IVPB q8hr IV, Vancomycin 500mg  in NS 100ml IVPB q48 hr IV, with no PRN medications administered today.  Visited patient and husband, [name] at bedside. Patient does not appear in distress. She does not respond to my voice but is awake. Husband states she is doing better today and this is her normal.  Afebrile today with stable VS. SpO2 100% on RA. Offered support to husband and asked home to contact [facility] if any new  concerns arise.   Patient has OOF DNR, Hospice will continue to support with EOL care and anticipate discharge needs.   [facility] team including SW and RN made aware of patient admission and condition. Primary care physician Dr. Grayce SessionsMedina-Vargas office also notified of patient admission.   [facility] will continue to follow and anticipate discharge needs. Should ambulance transport be needed at time of discharge, please call [facility] as [facility] contracts with [facility] for our patients.  [facility] Transfer summary and current medication list has been placed on hospital shadow chart.  Please call with any hospice related questions.  Thank you,  Elsie SaasMary Anne Robertson, RN, [facility] Hospital Liaison [phone]  [facility] Hospital Liaisons now found on AMION

## 2017-11-11 NOTE — Progress Notes (Signed)
PROGRESS NOTE    Amanda [name]  ZOX:[id number] DOB: [birth year] DOA: 11/09/2017 PCP: Pecola Lawless, MD   Brief Narrative: Amanda [name] is a 72 y.o. female with a history of stroke, hypertension, dementia, gout, s/p aortic aneurysm repair. She presents secondary to fever and altered mental status. She is currently under hospice care. She is being treated for SIRS of unknown source. She also has hypernatremia.   Assessment & Plan:   Principal Problem:   Sepsis (HCC) Active Problems:   Essential hypertension, benign   Acute renal failure superimposed on stage 3 chronic kidney disease (HCC)   DNR (do not resuscitate)   Hospice care   Hypernatremia   Acute metabolic encephalopathy   Malnutrition of moderate degree   SIRS Unknown source. Leukocytosis slightly improved. Urinalysis did not suggest UTI. No urine culture obtained. -Continue Vancomycin/Zosyn -Blood cultures pending (no growth to date)  Acute metabolic encephalopathy Secondary to infection/hypernatremia. Family refused CT scan on admission. Improved per discussion with husband.  Hypernatremia Likely secondary to decreased oral intake. Improved slightly. -Continue D5 1/2 NS -Repeat BMP q6 hours  Acute kidney injury on CKD 3 Secondary to dehydration. Improving with IV fluids -IV fluids as mentioned above.  Essential hypertension Antihypertensives held on admission secondary to sepsis. Hypertensive today. -If continues to be hypertensive, will restart home clonidine  Moderate malnutrition  Pressure ulcer, sacral stage 2 Present prior to arrival -Sacral x-ray   DVT prophylaxis: Lovenox Code Status:   Code Status: DNR Family Communication: Called husband Disposition Plan: Discharge to SNF when infection treated   Consultants:   None  Procedures:   None  Antimicrobials:  Vancomycin (4/2>>  Zosyn (4/2>>    Subjective: Afebrile overnight. Does not converse/interact with me. Per  husband, she is back to her previous baseline.  Objective: Vitals:   11/10/17 1705 11/10/17 1935 11/10/17 2339 11/11/17 0329  BP: (!) 182/113 (!) 155/92 (!) 152/70 (!) 157/94  Pulse: 86 99 92 89  Resp: 19 (!) 25 (!) 21 19  Temp: 97.6 F (36.4 C) 97.8 F (36.6 C) 98.8 F (37.1 C) 98 F (36.7 C)  TempSrc: Axillary Oral Oral Oral  SpO2: 100% 100% 100% 100%  Weight:      Height:        Intake/Output Summary (Last 24 hours) at 11/11/2017 0843 Last data filed at 11/10/2017 2314 Gross per 24 hour  Intake 2076.67 ml  Output 900 ml  Net 1176.67 ml   Filed Weights   11/09/17 2144 11/10/17 0214  Weight: 52.9 kg (116 lb 10 oz) 51.2 kg (112 lb 14 oz)    Examination:  General exam: Calm and comfortable Respiratory system: Clear/diminished. No distress Cardiovascular system: Regular rate and rhythm. Normal S1 and S2. No heart murmurs present. No extra heart sounds Gastrointestinal system: Soft, non-tender, non-distended, no guarding, no rebound, no masses felt Central nervous system: Somnolent but responds tactile stimuli. Extremities: No edema. No calf tenderness Skin: No cyanosis.    Data Reviewed: I have personally reviewed following labs and imaging studies  CBC: Recent Labs  Lab 11/09/17 2205 11/10/17 0242 11/11/17 0741  WBC 25.4* 22.5* 15.1*  NEUTROABS 21.4*  --   --   HGB 13.0 12.2 11.4*  HCT 41.2 39.5 37.0  MCV 95.6 96.1 95.6  PLT 328 284 275   Basic Metabolic Panel: Recent Labs  Lab 11/09/17 2205 11/10/17 0242 11/10/17 1243 11/10/17 1623 11/10/17 2355  NA 159* 157* 156* 152* 152*  K 3.6 3.5 4.0 4.0 3.4*  CL 128* 128* 128* 126* 125*  CO2 19* 18* 18* 16* 19*  GLUCOSE 119* 138* 128* 115* 112*  BUN 43* 40* 34* 31* 26*  CREATININE 2.09* 1.94* 1.68* 1.62* 1.50*  CALCIUM 9.0 8.4* 8.4* 8.1* 8.4*   GFR: Estimated Creatinine Clearance: 24.8 mL/min (A) (by C-G formula based on SCr of 1.5 mg/dL (H)). Liver Function Tests: Recent Labs  Lab 11/09/17 2205  AST  19  ALT 14  ALKPHOS 65  BILITOT 1.0  PROT 7.9  ALBUMIN 2.9*   No results for input(s): LIPASE, AMYLASE in the last 168 hours. No results for input(s): AMMONIA in the last 168 hours. Coagulation Profile: Recent Labs  Lab 11/09/17 2205  INR 1.21   Cardiac Enzymes: No results for input(s): CKTOTAL, CKMB, CKMBINDEX, TROPONINI in the last 168 hours. BNP (last 3 results) No results for input(s): PROBNP in the last 8760 hours. HbA1C: No results for input(s): HGBA1C in the last 72 hours. CBG: Recent Labs  Lab 11/10/17 0846  GLUCAP 142*   Lipid Profile: No results for input(s): CHOL, HDL, LDLCALC, TRIG, CHOLHDL, LDLDIRECT in the last 72 hours. Thyroid Function Tests: No results for input(s): TSH, T4TOTAL, FREET4, T3FREE, THYROIDAB in the last 72 hours. Anemia Panel: No results for input(s): VITAMINB12, FOLATE, FERRITIN, TIBC, IRON, RETICCTPCT in the last 72 hours. Sepsis Labs: Recent Labs  Lab 11/09/17 2230 11/10/17 0242 11/10/17 0637  PROCALCITON  --  1.80  --   LATICACIDVEN 1.37 1.2 1.5    Recent Results (from the past 240 hour(s))  Culture, blood (Routine x 2)     Status: None (Preliminary result)   Collection Time: 11/09/17 10:07 PM  Result Value Ref Range Status   Specimen Description BLOOD LEFT HAND  Final   Special Requests IN PEDIATRIC BOTTLE Blood Culture adequate volume  Final   Culture   Final    NO GROWTH < 12 HOURS Performed at [facility], [address] [place], [place] [address]    Report Status PENDING  Incomplete  Culture, blood (Routine x 2)     Status: None (Preliminary result)   Collection Time: 11/09/17 10:07 PM  Result Value Ref Range Status   Specimen Description BLOOD RIGHT WRIST  Final   Special Requests   Final    BOTTLES DRAWN AEROBIC AND ANAEROBIC Blood Culture adequate volume   Culture   Final    NO GROWTH < 12 HOURS Performed at [facility], [address] [place], [place] [address]    Report Status PENDING   Incomplete  MRSA PCR Screening     Status: None   Collection Time: 11/10/17  2:09 AM  Result Value Ref Range Status   MRSA by PCR NEGATIVE NEGATIVE Final    Comment:        The GeneXpert MRSA Assay (FDA approved for NASAL specimens only), is one component of a comprehensive MRSA colonization surveillance program. It is not intended to diagnose MRSA infection nor to guide or monitor treatment for MRSA infections. Performed at [facility], [address] [place], [place] [address]          Radiology Studies: Dg Chest Portable 1 View  Result Date: 11/09/2017 CLINICAL DATA:  Sepsis. EXAM: PORTABLE CHEST 1 VIEW COMPARISON:  Chest x-ray dated August 15, 2017. FINDINGS: The patient is rotated to the right. Cardiomegaly. Tortuous, aneurysmal thoracic aorta, better evaluated on prior CT. Normal pulmonary vascularity. Streaky atelectasis in the right mid lung. No focal consolidation, pleural  effusion, or pneumothorax. No acute osseous abnormality. IMPRESSION: 1. Limited evaluation due to patient rotation.  No active disease. Electronically Signed   By: Obie Dredge M.D.   On: 11/09/2017 22:13        Scheduled Meds: . enoxaparin (LOVENOX) injection  30 mg Subcutaneous Q24H  . metoprolol tartrate  5 mg Intravenous Q8H   Continuous Infusions: . dextrose 5 % and 0.45% NaCl    . piperacillin-tazobactam (ZOSYN)  IV 2.25 g (11/11/17 0736)  . [START ON 11/12/2017] vancomycin       LOS: 1 day     Jacquelin Hawking, MD [facility] 11/11/2017, 8:43 AM Pager: [phone]  If 7PM-7AM, please contact night-coverage [email or web address] Password [facility] 11/11/2017, 8:43 AM

## 2017-11-11 NOTE — Progress Notes (Signed)
Pharmacy Antibiotic Note  Amanda [name] is a 72 y.o. female with concern of sepsis (unknown source) on vancomycin and zosyn.  -WBC= 15.1,  Afeb, SCr= 1.35 and CrCl ~ 30 -cultures: ngtd   Plan: -Change vancomycin to 500mg  IV q24h -Consider d/c vancomycin -No zosyn changes needed  Height: 5\' 1"  (154.9 cm) Weight: 112 lb 14 oz (51.2 kg) IBW/kg (Calculated) : 47.8  Temp (24hrs), Avg:97.9 F (36.6 C), Min:97.5 F (36.4 C), Max:98.8 F (37.1 C)  Recent Labs  Lab 11/09/17 2205 11/09/17 2230 11/10/17 0242 11/10/17 0637 11/10/17 1243 11/10/17 1623 11/10/17 2355 11/11/17 0741 11/11/17 1118  WBC 25.4*  --  22.5*  --   --   --   --  15.1*  --   CREATININE 2.09*  --  1.94*  --  1.68* 1.62* 1.50* 1.31* 1.35*  LATICACIDVEN  --  1.37 1.2 1.5  --   --   --   --   --     Estimated Creatinine Clearance: 27.6 mL/min (A) (by C-G formula based on SCr of 1.35 mg/dL (H)).    Allergies  Allergen Reactions  . Pollen Extract Other (See Comments)    rhinitis    Antimicrobials this admission:  4/2 vanc>> 4/2 zosyn  Dose adjustments this admission:   Microbiology results: 4/2 bolod x2- ngtd 4/3 MRSA PCR- neg  Thank you for allowing pharmacy to be a part of this patient's care.  Harland GermanAndrew Gracious Renken, PharmD Clinical Pharmacist Clinical phone from 8:30-4:00 is [phone] After 4pm, please call Main Rx [phone]) for assistance. 11/11/2017 2:08 PM

## 2017-11-11 NOTE — Consult Note (Addendum)
WOC Nurse wound consult note Reason for Consult: Consult requested for sacrum/buttocks.  Pt has pink dry scar tissue to these locations from previous wounds which have healed and there is pink dry intact scar tissue.  In the center, over the sacrum, scar tissue has broken down and evolved into an area of stage 2 pressure injury; 2X2X.1cm, pink and moist, surrounded by patchy areas of macerated skin related to moisture associated skin damage from incontinence. Pressure Injury POA: Yes Dressing procedure/placement/frequency: Foam dressing to protect and promote healing. No family present to discuss plan of care. Please re-consult if further assistance is needed.  Thank-you,  Cammie Mcgeeawn Ronella Plunk MSN, RN, CWOCN, [place]AP, CNS [phone]

## 2017-11-12 LAB — BASIC METABOLIC PANEL
ANION GAP: 6 (ref 5–15)
BUN: 12 mg/dL (ref 6–20)
CALCIUM: 7.9 mg/dL — AB (ref 8.9–10.3)
CO2: 18 mmol/L — ABNORMAL LOW (ref 22–32)
Chloride: 119 mmol/L — ABNORMAL HIGH (ref 101–111)
Creatinine, Ser: 1.23 mg/dL — ABNORMAL HIGH (ref 0.44–1.00)
GFR, EST AFRICAN AMERICAN: 49 mL/min — AB (ref >60)
GFR, EST NON AFRICAN AMERICAN: 42 mL/min — AB (ref >60)
GLUCOSE: 103 mg/dL — AB (ref 65–99)
Potassium: 3.3 mmol/L — ABNORMAL LOW (ref 3.5–5.1)
Sodium: 143 mmol/L (ref 135–145)

## 2017-11-12 LAB — GLUCOSE, CAPILLARY: GLUCOSE-CAPILLARY: 85 mg/dL (ref 65–99)

## 2017-11-12 LAB — OSMOLALITY: Osmolality: 344 mOsm/kg (ref 275–295)

## 2017-11-12 MED ORDER — POTASSIUM CHLORIDE 10 MEQ/100ML IV SOLN
INTRAVENOUS | Status: AC
  Administered 2017-11-12: 10 meq via INTRAVENOUS
  Filled 2017-11-12: qty 100

## 2017-11-12 MED ORDER — CLONIDINE HCL 0.1 MG PO TABS
0.1000 mg | ORAL_TABLET | Freq: Two times a day (BID) | ORAL | Status: DC
  Administered 2017-11-12 – 2017-11-13 (×2): 0.1 mg via ORAL
  Filled 2017-11-12 (×2): qty 1

## 2017-11-12 MED ORDER — PIPERACILLIN-TAZOBACTAM 3.375 G IVPB
3.3750 g | Freq: Three times a day (TID) | INTRAVENOUS | Status: DC
  Administered 2017-11-12 – 2017-11-13 (×3): 3.375 g via INTRAVENOUS
  Filled 2017-11-12 (×4): qty 50

## 2017-11-12 MED ORDER — POTASSIUM CHLORIDE 10 MEQ/100ML IV SOLN
10.0000 meq | INTRAVENOUS | Status: AC
  Administered 2017-11-12 – 2017-11-13 (×4): 10 meq via INTRAVENOUS
  Filled 2017-11-12 (×2): qty 100

## 2017-11-12 NOTE — Care Management Note (Addendum)
Case Management Note  Patient Details  Name: Amanda [name] MRN: [id number] Date of Birth: 29-Aug-1945  Subjective/Objective:    Pt admitted with Sepsis                Action/Plan:  PTA from home with [facility] already following.  All needed equipment already in the home.  Agency will accept pt back at discharge - actively following.  Pt will need [facility] transport home.     Expected Discharge Date:                  Expected Discharge Plan:  Home w Hospice Care([facility] with [facility])  In-House Referral:  Clinical Social Work  Discharge planning Services  CM Consult  Post Acute Care Choice:    Choice offered to:     DME Arranged:    DME Agency:     HH Arranged:    HH Agency:     Status of Service:     If discussed at [employer] Meetings, dates discussed:    Additional Comments:  Cherylann ParrClaxton, Nahjae Hoeg S, RN 11/12/2017, 3:11 PM

## 2017-11-12 NOTE — Plan of Care (Signed)
Continue current plan of care.

## 2017-11-12 NOTE — Progress Notes (Signed)
PROGRESS NOTE    Zuzanna SYANN CUPPLES  ZOX:[id number] DOB: 1946-07-24 DOA: 11/09/2017 PCP: Pecola Lawless, MD   Brief Narrative: Amanda [name] is a 72 y.o. female with a history of stroke, hypertension, dementia, gout, s/p aortic aneurysm repair. She presents secondary to fever and altered mental status. She is currently under hospice care. She is being treated for SIRS of unknown source. She also has hypernatremia.   Assessment & Plan:   Principal Problem:   Sepsis (HCC) Active Problems:   Essential hypertension, benign   Acute renal failure superimposed on stage 3 chronic kidney disease (HCC)   DNR (do not resuscitate)   Hospice care   Hypernatremia   Acute metabolic encephalopathy   Malnutrition of moderate degree   SIRS Source continues to be unknown. Urinalysis did not suggest UTI. No urine culture obtained. Blood culture no growth to date. Sacral x-ray without osteomyelitis. Sacral pressure ulcer not infected. -Continue Vancomycin/Zosyn, transition to oral regimen when ready for discharge -Blood cultures pending (no growth to date)  Acute metabolic encephalopathy Secondary to infection/hypernatremia. Family refused CT scan on admission. Improved per discussion with husband.  Hypernatremia Likely secondary to decreased oral intake. Improved slightly. Patient is not eating by mouth -Continue D5 1/2 NS, may need to switch to D5 fluids pending sodium -Repeat BMP -Discussed with husband on 4/4 that this will likely be a recurring issue -SLP eval  Acute kidney injury on CKD 3 Secondary to dehydration. Improved with IV fluids. Baseline difficult to determine, but around 1.5. 2.09 on admission. -IV fluids as mentioned above.  Essential hypertension Antihypertensives held on admission secondary to sepsis. Hypertensive today. -Continue metoprolol -Restart home Clonidine  Moderate malnutrition  Pressure ulcer, sacral stage 2 Present prior to arrival. No  osteomyelitis.   DVT prophylaxis: Lovenox Code Status:   Code Status: DNR Family Communication: None at bedside Disposition Plan: Discharge to SNF when infection treated/hypernatremia resolved.   Consultants:   None  Procedures:   None  Antimicrobials:  Vancomycin (4/2>>  Zosyn (4/2>>    Subjective: No issues overnight  Objective: Vitals:   11/11/17 1900 11/12/17 0530 11/12/17 0540 11/12/17 0610  BP: (!) 149/88 (!) 184/114    Pulse: 94 95 94   Resp: 20     Temp: 98.4 F (36.9 C)   97.9 F (36.6 C)  TempSrc: Axillary   Axillary  SpO2: 100%     Weight:      Height:        Intake/Output Summary (Last 24 hours) at 11/12/2017 0804 Last data filed at 11/11/2017 2352 Gross per 24 hour  Intake 2136.67 ml  Output -  Net 2136.67 ml   Filed Weights   11/09/17 2144 11/10/17 0214  Weight: 52.9 kg (116 lb 10 oz) 51.2 kg (112 lb 14 oz)    Examination:  General exam: Appears calm and comfortable Respiratory system: Clear to auscultation, diminished. Respiratory effort normal. Cardiovascular system: S1 & S2 heard, RRR. No murmurs, rubs, gallops or clicks. Gastrointestinal system: Abdomen is nondistended, soft and nontender. No organomegaly or masses felt. Normal bowel sounds heard. Central nervous system: Somnolent. Moans intermittently. Reacts to noxious stimuli Extremities: No edema. No calf tenderness Skin: No cyanosis.     Data Reviewed: I have personally reviewed following labs and imaging studies  CBC: Recent Labs  Lab 11/09/17 2205 11/10/17 0242 11/11/17 0741  WBC 25.4* 22.5* 15.1*  NEUTROABS 21.4*  --   --   HGB 13.0 12.2 11.4*  HCT 41.2 39.5 37.0  MCV 95.6 96.1 95.6  PLT 328 284 275   Basic Metabolic Panel: Recent Labs  Lab 11/10/17 1623 11/10/17 2355 11/11/17 0741 11/11/17 1118 11/11/17 1843 11/11/17 2100  NA 152* 152* 150* 152* 151*  --   K 4.0 3.4* 3.1* 3.5 2.7*  --   CL 126* 125* 122* 122* 123*  --   CO2 16* 19* 18* 17* 17*  --     GLUCOSE 115* 112* 124* 103* 121*  --   BUN 31* 26* 22* 22* 19  --   CREATININE 1.62* 1.50* 1.31* 1.35* 1.38*  --   CALCIUM 8.1* 8.4* 8.0* 8.2* 8.0*  --   MG  --   --   --   --   --  1.8   GFR: Estimated Creatinine Clearance: 27 mL/min (A) (by C-G formula based on SCr of 1.38 mg/dL (H)). Liver Function Tests: Recent Labs  Lab 11/09/17 2205  AST 19  ALT 14  ALKPHOS 65  BILITOT 1.0  PROT 7.9  ALBUMIN 2.9*   No results for input(s): LIPASE, AMYLASE in the last 168 hours. No results for input(s): AMMONIA in the last 168 hours. Coagulation Profile: Recent Labs  Lab 11/09/17 2205  INR 1.21   Cardiac Enzymes: No results for input(s): CKTOTAL, CKMB, CKMBINDEX, TROPONINI in the last 168 hours. BNP (last 3 results) No results for input(s): PROBNP in the last 8760 hours. HbA1C: No results for input(s): HGBA1C in the last 72 hours. CBG: Recent Labs  Lab 11/10/17 0846 11/11/17 0849  GLUCAP 142* 122*   Lipid Profile: No results for input(s): CHOL, HDL, LDLCALC, TRIG, CHOLHDL, LDLDIRECT in the last 72 hours. Thyroid Function Tests: No results for input(s): TSH, T4TOTAL, FREET4, T3FREE, THYROIDAB in the last 72 hours. Anemia Panel: No results for input(s): VITAMINB12, FOLATE, FERRITIN, TIBC, IRON, RETICCTPCT in the last 72 hours. Sepsis Labs: Recent Labs  Lab 11/09/17 2230 11/10/17 0242 11/10/17 0637  PROCALCITON  --  1.80  --   LATICACIDVEN 1.37 1.2 1.5    Recent Results (from the past 240 hour(s))  Culture, blood (Routine x 2)     Status: None (Preliminary result)   Collection Time: 11/09/17 10:07 PM  Result Value Ref Range Status   Specimen Description BLOOD LEFT HAND  Final   Special Requests IN PEDIATRIC BOTTLE Blood Culture adequate volume  Final   Culture   Final    NO GROWTH 2 DAYS Performed at [facility], [address], [place], [place] [address]    Report Status PENDING  Incomplete  Culture, blood (Routine x 2)     Status: None (Preliminary  result)   Collection Time: 11/09/17 10:07 PM  Result Value Ref Range Status   Specimen Description BLOOD RIGHT WRIST  Final   Special Requests   Final    BOTTLES DRAWN AEROBIC AND ANAEROBIC Blood Culture adequate volume   Culture   Final    NO GROWTH 2 DAYS Performed at [facility], [address] [place], [place] [address]    Report Status PENDING  Incomplete  MRSA PCR Screening     Status: None   Collection Time: 11/10/17  2:09 AM  Result Value Ref Range Status   MRSA by PCR NEGATIVE NEGATIVE Final    Comment:        The GeneXpert MRSA Assay (FDA approved for NASAL specimens only), is one component of a comprehensive MRSA colonization surveillance program. It is not intended to diagnose MRSA infection nor to  guide or monitor treatment for MRSA infections. Performed at [facility], [address] [place], [place] [address]          Radiology Studies: Dg Sacrum/coccyx  Result Date: 11/11/2017 CLINICAL DATA:  Pressure ulcer on sacrum. EXAM: SACRUM AND COCCYX - 2+ VIEW COMPARISON:  CT 08/16/2017. FINDINGS: No acute bony or joint abnormality. No evidence of fracture. Aortic atherosclerotic vascular calcification. Surgical screws noted over the anterior lower lumbar spine. IMPRESSION: 1. Diffuse osteopenia and degenerative change. No acute abnormality. 2.  Aortoiliac atherosclerotic vascular calcification. Electronically Signed   By: Maisie Fushomas  Register   On: 11/11/2017 09:39        Scheduled Meds: . enoxaparin (LOVENOX) injection  30 mg Subcutaneous Q24H  . metoprolol tartrate  5 mg Intravenous Q8H   Continuous Infusions: . dextrose 5 % and 0.45% NaCl 100 mL/hr at 11/12/17 0400  . piperacillin-tazobactam (ZOSYN)  IV 2.25 g (11/12/17 0728)  . vancomycin Stopped (11/11/17 1636)     LOS: 2 days     Jacquelin Hawkingalph Michon Kaczmarek, MD [facility] 11/12/2017, 8:04 AM Pager: [phone]  If 7PM-7AM, please contact night-coverage [email or web address] Password  [email or web address] 11/12/2017, 8:04 AM

## 2017-11-12 NOTE — Progress Notes (Signed)
[facility] 2C 09 - [facility]) RN visit @ 8: 30 AM  This is a related and covered hospital admission of 11/09/17 with a [facility] diagnosis of Alzheimer's Disease, per Dr. Smith MinceMazzocchi of [facility].  Patient seen in room, resting comfortably in bed.  Patient did not arouse to verbal stimuli.   O2 sats are currently 100% on RA and respirations in the low 20's.  She does not appear in any distress.  Per discussion with staff RN, patient may discharge later today or tomorrow pending lab results.  Potassium was 2.7 and 6 runs of K were administered.  No family present at time of visit.  Placed phone call to husband [name], who stated he plans to visit later today.  Updated husband on patient's current status and he denied any questions or concerns.  [facility] with Seps[facility].  [facility] LOS 3  Internal Medicine Notes on 11/12/17:  SIRS Source continues to be unknown. Urinalysis did not suggest UTI. No urine culture obtained. Blood culture no growth to date. Sacral x-ray without osteomyelitis. Sacral pressure ulcer not infected. -Continue Vancomycin/Zosyn, transition to oral regimen when ready for discharge -Blood cultures pending (no growth to date) Acute metabolic encephalopathy Secondary to infection/hypernatremia. Family refused CT scan on admission. Improved per discussion with husband. Hypernatremia Likely secondary to decreased oral intake. Improved slightly. Patient is not eating by mouth -Continue D5 1/2 NS, may need to switch to D5 fluids pending sodium -Repeat BMP -Discussed with husband on 4/4 that this will likely be a recurring issue -SLP eval Acute kidney injury on CKD 3 Secondary to dehydration. Improved with IV fluids. Baseline difficult to determine, but around 1.5. 2.09 on admission. -IV fluids as mentioned above. Essential hypertension Antihypertensives held on admission secondary to sepsis. Hypertensive today. -Continue metoprolol -Restart home  Clonidine Moderate malnutrition Pressure ulcer, sacral stage 2 Present prior to arrival. No osteomyelitis.  Medications: Patient on Lovenox 30mg  inj QD, Lopressor 5mg  inj q8hr, D50.45%NS infusing at 18800ml/hr, Zosyn 2.25g IVPB q8hr IV, Vancomycin 500mg  in NS 100ml IVPB q48 hr IV. Per chart review, patient has not required any PRN medications the past 24 hours.  Patient has OOF DNR, Hospice will continue to support with EOL care and anticipate discharge needs.   [facility] team including SW and RN made aware of patient admission and condition. Primary care physician Dr. Grayce SessionsMedina-Vargas office also notified of patient admission.   [facility] will continue to follow and anticipate discharge needs.Should ambulance transport be needed at time of discharge, please call [facility] as [facility] contracts with [facility] for our patients.   Please feel free to call with any hospice-related questions or concerns.  Thank you,  Hessie KnowsStacie Wilkinson, RN, BSN [facility] Hospital Liaison [phone]  [facility] Hospital Liaisons now found on AMION

## 2017-11-12 NOTE — Evaluation (Signed)
Clinical/Bedside Swallow Evaluation Patient Details  Name: Amanda [name] MRN: [id number] Date of Birth: Jan 12, 1946  Today's Date: 11/12/2017 Time: SLP Start Time (ACUTE ONLY): 1200 SLP Stop Time (ACUTE ONLY): 1226 SLP Time Calculation (min) (ACUTE ONLY): 26 min  Past Medical History:  Past Medical History:  Diagnosis Date  . Dementia    "alzheimer's" (05/18/2012)  . Fall 05/17/2012  . Fracture of medial wall of orbit (HCC) 05/17/2012  . Gout    "? feet" (05/18/2012); uric acid 9.4 on 08/06/16 with R wrist pain; 7.1 on 08/15/16 on Allopurinol  . Hypertension   . Incontinence of urine   . Stroke [facility](HCC) ~ 2003   "slight memory loss" (05/18/2012)  . Wrist fracture, bilateral 05/17/2012   "fell down steps" (05/18/2012)   Past Surgical History:  Past Surgical History:  Procedure Laterality Date  . fractured tooth     01/06/17 Dr Ocie DoyneScott Jensen DMD extracted tooth  #4   . VAGINAL HYSTERECTOMY     HPI:  Amanda P Williamsonis a 72 y.o.femalewith medical history significant ofstroke (non-verbal), hypertension, dementia, gout, s/p of aortic aneurysm repair, under hospice care, who presents with fever, decreased level of consciousness.  Per family, pt is non-verbal and barely moving extremities at baseline, but she seems to have a decreased level of consciousness today. She has fever of 103 at SNF.She has snoring respirations at this time.No active cough, nausea, vomiting, diarrhea noted. Review patient has any chest pain or abdominal pain. Patient has any symptoms of UTI. When I saw pt in ED, she is unresponsiveand grimace to painful stimuli.Most recent chest xray was showing no active disease.   Assessment / Plan / Recommendation Clinical Impression  Clinical swallowing evaluation was completed in setting for admission due to sepsis and hypernatremia.  The patient is known to ST service from previous admission with clinical swallowing evaluation completed in January 2019.  Final  recommendation was for comfort feedings as the patient was being discharged with hospice services.  The patient's husband was present at the bedside today and reported that the patient has been on a pureed diet with nectar thick liquids by spoon since her discharge in january and that she had been doing well on that until about a week ago.   At that time she stopped eating/drinking.  The patient was unable to follow any commands and kept her eyes closed the entire time but did respond with a moan to verbal and tactile stimuli.  Her husband reported that she does not open her eyes frequently but does respond appropriately when a spoon is touched to her lips.  Given purees and nectar thick liquids via spoon the patient was observed to have a probable oropharyngeal dysphagia.  Labial seal was mildly decreased which led to mild anterior escape given liquids.  She was noted to be slow orally and question possible delay in her swallow trigger.  Hyo-laryngeal excursion was appreciated to palpation.  No overt s/s of aspiration were seen.  After a few sips of liquids and bites of pureed material the patient no longer opened her mouth to the spoon indicating that she was finished.  Recommend resume a pureed diet with nectar thick liquids by spoon.   The patient will need to be fed very slowly allowing her time to swallow each bolus.  Her intake will most likely be poor.   ST will follow briefly for family education.   SLP Visit Diagnosis: Dysphagia, oropharyngeal phase (R13.12)    Aspiration Risk  Moderate  aspiration risk    Diet Recommendation   Dysphagia with nectar liquids by spoon  Medication Administration: Crushed with puree    Other  Recommendations Oral Care Recommendations: Oral care BID;Oral care before and after PO   Follow up Recommendations None      Frequency and Duration min 1 x/week  2 weeks       Prognosis Prognosis for Safe Diet Advancement: Guarded Barriers to Reach Goals: Cognitive  deficits      Swallow Study   General Date of Onset: 11/09/17 HPI: Amanda P Williamsonis a 72 y.o.femalewith medical history significant ofstroke (non-verbal), hypertension, dementia, gout, s/p of aortic aneurysm repair, under hospice care, who presents with fever, decreased level of consciousness.  Per family, pt is non-verbal and barely moving extremities at baseline, but she seems to have a decreased level of consciousness today. She has fever of 103 at SNF.She has snoring respirations at this time.No active cough, nausea, vomiting, diarrhea noted. Review patient has any chest pain or abdominal pain. Patient has any symptoms of UTI. When I saw pt in ED, she is unresponsiveand grimace to painful stimuli.Most recent chest xray was showing no active disease. Type of Study: Bedside Swallow Evaluation Previous Swallow Assessment: January 2019 with recommendation for comfort feeds as the patient being discharged wti h hospice services.   Diet Prior to this Study: NPO Temperature Spikes Noted: Yes History of Recent Intubation: No Behavior/Cognition: Doesn't follow directions Oral Cavity Assessment: Other (comment)(unable to visualize) Oral Care Completed by SLP: No Self-Feeding Abilities: Total assist Patient Positioning: Partially reclined Baseline Vocal Quality: Not observed Volitional Cough: Cognitively unable to elicit Volitional Swallow: Unable to elicit    Oral/Motor/Sensory Function Overall Oral Motor/Sensory Function: Other (comment)(Unable to assess)   Ice Chips Ice chips: Not tested   Thin Liquid Thin Liquid: Not tested    Nectar Thick Nectar Thick Liquid: Impaired Presentation: Spoon Oral Phase Impairments: Reduced labial seal Pharyngeal Phase Impairments: Suspected delayed Swallow   Honey Thick Honey Thick Liquid: Not tested   Puree Puree: Impaired Presentation: Spoon Oral Phase Impairments: Impaired mastication Oral Phase Functional Implications: Prolonged oral  transit Pharyngeal Phase Impairments: Suspected delayed Swallow   Solid   GO   Solid: Not tested       Dimas Aguas, MA, CCC-SLP Acute Rehab SLP [phone] Fleet Contras 11/12/2017,1:32 PM

## 2017-11-13 DIAGNOSIS — E440 Moderate protein-calorie malnutrition: Secondary | ICD-10-CM

## 2017-11-13 DIAGNOSIS — R6510 Systemic inflammatory response syndrome (SIRS) of non-infectious origin without acute organ dysfunction: Secondary | ICD-10-CM

## 2017-11-13 LAB — BASIC METABOLIC PANEL
ANION GAP: 9 (ref 5–15)
BUN: 8 mg/dL (ref 6–20)
CO2: 18 mmol/L — AB (ref 22–32)
CREATININE: 1.15 mg/dL — AB (ref 0.44–1.00)
Calcium: 8 mg/dL — ABNORMAL LOW (ref 8.9–10.3)
Chloride: 116 mmol/L — ABNORMAL HIGH (ref 101–111)
GFR calc non Af Amer: 46 mL/min — ABNORMAL LOW (ref >60)
GFR, EST AFRICAN AMERICAN: 53 mL/min — AB (ref >60)
Glucose, Bld: 84 mg/dL (ref 65–99)
POTASSIUM: 4 mmol/L (ref 3.5–5.1)
SODIUM: 143 mmol/L (ref 135–145)

## 2017-11-13 LAB — CBC
HEMATOCRIT: 36.2 % (ref 36.0–46.0)
HEMOGLOBIN: 11.5 g/dL — AB (ref 12.0–15.0)
MCH: 29.1 pg (ref 26.0–34.0)
MCHC: 31.8 g/dL (ref 30.0–36.0)
MCV: 91.6 fL (ref 78.0–100.0)
PLATELETS: 281 10*3/uL (ref 150–400)
RBC: 3.95 MIL/uL (ref 3.87–5.11)
RDW: 13.3 % (ref 11.5–15.5)
WBC: 10 10*3/uL (ref 4.0–10.5)

## 2017-11-13 LAB — GLUCOSE, CAPILLARY: Glucose-Capillary: 94 mg/dL (ref 65–99)

## 2017-11-13 MED ORDER — AMOXICILLIN-POT CLAVULANATE 500-125 MG PO TABS: 1.0000 | ORAL_TABLET | Freq: Two times a day (BID) | ORAL | Status: AC

## 2017-11-13 MED ORDER — DOXYCYCLINE HYCLATE 50 MG PO CAPS: 50.0000 mg | ORAL_CAPSULE | Freq: Two times a day (BID) | ORAL | Status: AC

## 2017-11-13 MED ORDER — POTASSIUM CHLORIDE 10 MEQ/100ML IV SOLN
INTRAVENOUS | Status: AC
  Administered 2017-11-13: 10 meq via INTRAVENOUS
  Filled 2017-11-13: qty 100

## 2017-11-13 NOTE — Progress Notes (Signed)
Report called to [facility] to Ramatou, LPN. Pt currently resting in bed, no s/s of pain or distress.

## 2017-11-13 NOTE — Clinical Social Work Note (Signed)
Clinical Social Work Assessment  Patient Details  Name: Amanda [name] MRN: [id number] Date of Birth: [birth year]  Date of referral:  11/13/17               Reason for consult:  Facility Placement                Permission sought to share information with:  Family Supports Permission granted to share information::  Yes, Release of Information Signed  Name::     Amanda FavreClarence  Agency::     Relationship::  Spouse  Contact Information:  [phone]  Housing/Transportation Living arrangements for the past 2 months:  Skilled Nursing Facility Source of Information:  Spouse Patient Interpreter Needed:  None Criminal Activity/Legal Involvement Pertinent to Current Situation/Hospitalization:  No - Comment as needed Significant Relationships:  Adult Children, Spouse Lives with:  Facility Resident Do you feel safe going back to the place where you live?  Yes Need for family participation in patient care:  No (Coment)  Care giving concerns:  Pt is disoriented and only moans to voice. CSW spoke with pt's spouse via telephone. [facility].   Social Worker assessment / plan:  CSW spoke with pt's spouse via telephone. Pt's spouse had just left the hopsital to run a errand. Pt's spouse is agreeable to pt returning to [facility] at d/c. Facility admissions staff will be at the facility around 3 and ask that CSW set up transport for then.   Employment status:    Insurance information:  Managed Medicare's  PT Recommendations:  Skilled Nursing Facility Information / Referral to community resources:  Skilled Nursing Facility  Patient/Family's Response to care:  Pt's spouse verbalized understanding of CSW role and expressed appreciation for support. Pt's spouse denies any concern regarding pt care at this time.   Patient/Family's Understanding of and Emotional Response to Diagnosis, Current Treatment, and Prognosis:  Pt's spouse understands that pt will transfer back to  [facility] at 3 today. Pt's spouse understands pt's current diagnosis and treatment plan. CSW will set up transport for 3.  Emotional Assessment Appearance:  Appears stated age Attitude/Demeanor/Rapport:  Unable to Assess Affect (typically observed):  Unable to Assess Orientation:  (Moans to voice) Alcohol / Substance use:  Not Applicable Psych involvement (Current and /or in the community):  No (Comment)  Discharge Needs  Concerns to be addressed:  No discharge needs identified Readmission within the last 30 days:  No Current discharge risk:  None Barriers to Discharge:  No Barriers Identified   Amanda [name] A Vondell Babers, LCSW 11/13/2017, 12:37 PM

## 2017-11-13 NOTE — [place] FL2 (Signed)
[place] MEDICAID FL2 LEVEL OF CARE SCREENING TOOL     IDENTIFICATION  Patient Name: Amanda [name] Birthdate: 03-27-1946 Sex: female Admission Date (Current Location): 11/09/2017  [facility] [place]   Facility and Address:  [facility], [address], [place], [place] [address]      Provider Number: [id number]  Attending Physician Name and Address:  Narda BondsNettey, Ralph A, MD  Relative Name and Phone Number:       Current Level of Care: Hospital Recommended Level of Care: Skilled Nursing Facility Prior Approval Number:    Date Approved/Denied:   PASRR Number:    Discharge Plan: SNF    Current Diagnoses: Patient Active Problem List   Diagnosis Date Noted  . SIRS (systemic inflammatory response syndrome) (HCC) 11/13/2017  . Malnutrition of moderate degree 11/11/2017  . Hypernatremia 11/10/2017  . Acute metabolic encephalopathy 11/10/2017  . Alzheimer disease   . DNR (do not resuscitate)   . Hospice care   . Aspiration pneumonia (HCC)   . Palliative care encounter   . Pressure injury of skin 08/12/2017  . History of stroke 08/11/2017  . Acute confusional state 08/11/2017  . Dehydration with hypernatremia 08/11/2017  . Chronic constipation 09/17/2016  . History of gout 08/06/2016  . Anemia of chronic disease 10/08/2014  . Allergic rhinitis 11/24/2013  . Hyperlipidemia 02/27/2013  . Anxiety state 12/12/2012  . Vitamin D deficiency 12/12/2012  . Acute renal failure superimposed on stage 3 chronic kidney disease (HCC) 05/17/2012  . Essential hypertension, benign   . Dementia with behavioral disturbance     Orientation RESPIRATION BLADDER Height & Weight     (disoriented x3, only moans to voice)  Normal Incontinent Weight: 112 lb 14 oz (51.2 kg) Height:  5\' 1"  (154.9 cm)  BEHAVIORAL SYMPTOMS/MOOD NEUROLOGICAL BOWEL NUTRITION STATUS      Incontinent Diet(Dys 1; nectar thick liquids)  AMBULATORY STATUS COMMUNICATION  OF NEEDS Skin     Does not communicate(moans to voice) PU Stage and Appropriate Care   PU Stage 2 Dressing: (mid sacrum, foam dressing, change PRN)                   Personal Care Assistance Level of Assistance  Bathing, Feeding, Dressing           Functional Limitations Info  Sight, Hearing, Speech Sight Info: Adequate Hearing Info: Adequate Speech Info: Impaired(Moans)    SPECIAL CARE FACTORS FREQUENCY  OT (By licensed OT), PT (By licensed PT)                    Contractures Contractures Info: Not present    Additional Factors Info  Code Status, Allergies Code Status Info: DnR Allergies Info: Pollen extract           Current Medications (11/13/2017):  This is the current hospital active medication list Current Facility-Administered Medications  Medication Dose Route Frequency Provider Last Rate Last Dose  . acetaminophen (TYLENOL) suppository 650 mg  650 mg Rectal Q6H PRN Lorretta HarpNiu, Xilin, MD      . cloNIDine (CATAPRES) tablet 0.1 mg  0.1 mg Oral BID Narda BondsNettey, Ralph A, MD   0.1 mg at 11/13/17 0944  . dextrose 5 %-0.45 % sodium chloride infusion   Intravenous Continuous Narda BondsNettey, Ralph A, MD 100 mL/hr at 11/13/17 0940    . enoxaparin (LOVENOX) injection 30 mg  30 mg Subcutaneous Q24H Lorretta HarpNiu, Xilin, MD   30 mg at 11/13/17 0944  .  hydrALAZINE (APRESOLINE) injection 5 mg  5 mg Intravenous Q2H PRN Lorretta Harp, MD   5 mg at 11/10/17 1811  . metoprolol tartrate (LOPRESSOR) injection 5 mg  5 mg Intravenous Q8H Lorretta Harp, MD   5 mg at 11/13/17 0539  . ondansetron (ZOFRAN) injection 4 mg  4 mg Intravenous Q8H PRN Lorretta Harp, MD      . piperacillin-tazobactam (ZOSYN) IVPB 3.375 g  3.375 g Intravenous Q8H Gardner Candle, RPH   Stopped at 11/13/17 1610  . vancomycin (VANCOCIN) 500 mg in sodium chloride 0.9 % 100 mL IVPB  500 mg Intravenous Q24H Silvana Newness, [facility]   Stopped at 11/12/17 1827     Discharge Medications: Please see discharge summary for a list of discharge  medications.  Relevant Imaging Results:  Relevant Lab Results:   Additional Information ssn: [id number]  Reuel Boom Aleynah Rocchio, LCSW

## 2017-11-13 NOTE — Clinical Social Work Note (Addendum)
Clinical Social Worker facilitated patient discharge including contacting patient family and facility to confirm patient discharge plans.  Clinical information faxed to facility and family agreeable with plan.  CSW arranged [facility] to [place]-3:00 PM.  RN to call [phone] for report prior to discharge.  Clinical Social Worker will sign off for now as social work intervention is no longer needed. Please consult us again if new need arises.  Skippers CornerBridget Juri Dinning, [place] [phone]

## 2017-11-13 NOTE — Clinical Social Work Placement (Signed)
   CLINICAL SOCIAL WORK PLACEMENT  NOTE  Date:  11/13/2017  Patient Details  Name: Amanda [name] MRN: [id number] Date of Birth: 1945/12/15  Clinical Social Work is seeking post-discharge placement for this patient at the Skilled  Nursing Facility(long term care) level of care (*CSW will initial, date and re-position this form in  chart as items are completed):      Patient/family provided with [facility] Clinical Social Work Department's list of facilities offering this level of care within the geographic area requested by the patient (or if unable, by the patient's family).  Yes   Patient/family informed of their freedom to choose among providers that offer the needed level of care, that participate in Medicare, Medicaid or managed care program needed by the patient, have an available bed and are willing to accept the patient.      Patient/family informed of [facility]'s ownership interest in [facility] and [facility], as well as of the fact that they are under no obligation to receive care at these facilities.  PASRR submitted to EDS on       PASRR number received on       Existing PASRR number confirmed on       FL2 transmitted to all facilities in geographic area requested by pt/family on       FL2 transmitted to all facilities within larger geographic area on       Patient informed that his/her managed care company has contracts with or will negotiate with certain facilities, including the following:            Patient/family informed of bed offers received.  [facility]     Physician recommends and patient chooses bed at      [facility].  Patient to be transferred to facility by [facility]     Patient family notified on 11/13/17 of transfer.  Name of family member notified:  [name] and [name], spouse and son     PHYSICIAN       Additional Comment:     _______________________________________________ Maree KrabbeBridget A Audreena Sachdeva, LCSW 11/13/2017, 12:43 PM

## 2017-11-13 NOTE — Progress Notes (Signed)
[facility] 2C 09 - [facility]) RN visit @ 10:00 AM  This is a related and covered hospital admission of 11/09/17 with a [facility] diagnosis of Alzheimer's Disease, per Dr. Smith MinceMazzocchi of [facility].  [facility].  [facility] LOS4  Treatment for SIRs continues. No new MD notes this visit. Per RN if lab work shows improvement patient may discharge later today.   Medications: Patient on Lovenox 30mg  inj QD, Lopressor 5mg  inj q8hr, Clonidine 0.1mg  PO BID,  D50.45%NS infusing at 14900ml/hr, Zosyn 3.375g IVPB q8hr IV, Vancomycin 500mg  in NS 100ml IVPB q48 hr IV. Per chart review, patient has not required any PRN medications the past 24 hours.  Visited patient at bedside, no family present. NA was at bedside feeding breakfast. She did not appear in distress and was tolerating POs well.  She seems more alert today as well. Spoke with husband by telephone to update on patients current status and address any concerns. No concerns voiced at this time. States he is coming to visit later this morning.   Patient has OOF DNR, Hospice will continue to support with EOL care and anticipate discharge needs.   [facility] team including SW and RN made aware of patient admission and condition. Primary care physician Dr. Grayce SessionsMedina-Vargas office also notified of patient admission.  [facility] will continue to follow and anticipate discharge needs.Should ambulance transport be needed at time of discharge, please call [facility] as [facility] contracts with [facility] for our patients.   Please feel free to call with any hospice-related questions or concerns.  Thank you,  Charlynn CourtMary Anne Robertson,RN, [facility] Hospital Liaison [phone]  [facility] Hospital Liaisons now found on AMION

## 2017-11-13 NOTE — Discharge Summary (Signed)
Physician Discharge Summary  Amanda [name]:[id number] DOB: Jun 18, 1946 DOA: 11/09/2017  PCP: Pecola Lawless, MD  Admit date: 11/09/2017 Discharge date: 11/13/2017  Admitted From: SNF Disposition: SNF w/ Hospice  Recommendations for Outpatient Follow-up:  1. Follow up with hospice 2. Consider not returning patient to the hospital for dehydration/mental status changes if family agrees/in line with goals of care as this will likely be a recurring issue  Home Health: SNF w/ hospice Equipment/Devices: None  Discharge Condition: Guarded CODE STATUS: DNR Diet recommendation: Comfort feeds   Brief/Interim Summary:  Admission HPI written by Lorretta Harp, MD   Chief Complaint: fever and decreased level of consciousness.  HPI: Amanda [name] is a 72 y.o. female with medical history significant of stroke (non-verbal), hypertension, dementia, gout, s/p of aortic aneurysm repair, under hospice care, who presents with fever, decreased level of consciousness.  Per family, pt is non-verbal and barely moving extremities at baseline, but she seems to have a decreased level of consciousness today. She has fever of 103 at SNF. She has snoring respirations at this time. Not active cough, nausea, vomiting, diarrhea noted. Review patient has any chest pain or abdominal pain. Patient has any symptoms of UTI. When I saw pt in ED, she is unresponsive and grimace to painful stimuli.   ED Course: pt was found to have WBC 24.4, lactic acid 1.37, INR 1.21, negative urinalysis, sodium 159, worsening renal function, temperature 101.5, tachycardia, tachypnea, oxygen saturation 92% on 2 L nasal cannula oxygen. Chest x-ray is a limited study, but is negative for acute issues. Pt is admitted to telemetry bed as inpatient.    Hospital course:  SIRS Source continues to be unknown. Urinalysis did not suggest UTI. Sepsis not confirmed. No urine culture obtained. Blood culture no growth to date.  Sacral x-ray without osteomyelitis. Sacral pressure ulcer not infected. Treated with vancomycin and zosyn empirically and will switch to doxycycline and augmentin for continued empiric coverage.  Acute metabolic encephalopathy Secondary to infection/hypernatremia. Family refused CT scan on admission. Improved with treatment of possible infection and hypernatremia.  Hypernatremia Likely secondary to decreased oral intake. Improved slightly. Patient is not eating by mouth. Treated with D5 1/2 normal fluids. Resolved. Will likely recur.  Acute kidney injury on CKD 3 Secondary to dehydration. Improved with IV fluids. Baseline difficult to determine, but around 1.5. 2.09 on admission. Resolved prior to discharge.  Essential hypertension Antihypertensives held on admission secondary to sepsis. Hypertensive during admission. Continued metoprolol and clonidine.  Moderate malnutrition Comfort feeds  Pressure ulcer, sacral stage 2 Present prior to arrival. No osteomyelitis.    Discharge Diagnoses:  Principal Problem:   Sepsis (HCC) Active Problems:   Essential hypertension, benign   Acute renal failure superimposed on stage 3 chronic kidney disease (HCC)   DNR (do not resuscitate)   Hospice care   Hypernatremia   Acute metabolic encephalopathy   Malnutrition of moderate degree    Discharge Instructions   Allergies as of 11/13/2017      Reactions   Pollen Extract Other (See Comments)   rhinitis      Medication List    TAKE these medications   amoxicillin-clavulanate 500-125 MG tablet Commonly known as:  AUGMENTIN Take 1 tablet (500 mg total) by mouth 2 (two) times daily for 5 days.   CALCIUM-VITAMIN D PO Take 500 mg by mouth daily.   cloNIDine 0.1 MG tablet Commonly known as:  CATAPRES Take 0.1 mg by mouth 2 (two) times daily.  doxycycline 50 MG capsule Commonly known as:  VIBRAMYCIN Take 1 capsule (50 mg total) by mouth 2 (two) times daily for 5 days.     ELDERTONIC PO Take 15 mLs by mouth 2 (two) times daily.   labetalol 300 MG tablet Commonly known as:  NORMODYNE Take 300 mg by mouth 2 (two) times daily.   senna-docusate 8.6-50 MG tablet Commonly known as:  Senokot-S Take 1-2 tablets by mouth See admin instructions. Take 1 tablet QAM and 2 tablets QPM      Follow-up Information    Rehab, [facility].   Specialty:  Skilled Nursing Facility Contact information: [address] [place] [address] [phone]          Allergies  Allergen Reactions  . Pollen Extract Other (See Comments)    rhinitis    Consultations:  None   Procedures/Studies: Dg Sacrum/coccyx  Result Date: 11/11/2017 CLINICAL DATA:  Pressure ulcer on sacrum. EXAM: SACRUM AND COCCYX - 2+ VIEW COMPARISON:  CT 08/16/2017. FINDINGS: No acute bony or joint abnormality. No evidence of fracture. Aortic atherosclerotic vascular calcification. Surgical screws noted over the anterior lower lumbar spine. IMPRESSION: 1. Diffuse osteopenia and degenerative change. No acute abnormality. 2.  Aortoiliac atherosclerotic vascular calcification. Electronically Signed   By: Maisie Fus  Register   On: 11/11/2017 09:39   Dg Chest Portable 1 View  Result Date: 11/09/2017 CLINICAL DATA:  Sepsis. EXAM: PORTABLE CHEST 1 VIEW COMPARISON:  Chest x-ray dated August 15, 2017. FINDINGS: The patient is rotated to the right. Cardiomegaly. Tortuous, aneurysmal thoracic aorta, better evaluated on prior CT. Normal pulmonary vascularity. Streaky atelectasis in the right mid lung. No focal consolidation, pleural effusion, or pneumothorax. No acute osseous abnormality. IMPRESSION: 1. Limited evaluation due to patient rotation.  No active disease. Electronically Signed   By: Obie Dredge M.D.   On: 11/09/2017 22:13      Subjective: Afebrile. Does not communicate other than moaning intermittently  Discharge Exam: Vitals:   11/13/17 0335 11/13/17 0848  BP: (!) 157/97    Pulse: 92   Resp: (!) 22   Temp: 98.6 F (37 C) 98.1 F (36.7 C)  SpO2: 98%    Vitals:   11/12/17 2130 11/13/17 0000 11/13/17 0335 11/13/17 0848  BP: (!) 150/78 (!) 155/85 (!) 157/97   Pulse:  93 92   Resp:  20 (!) 22   Temp:  99.3 F (37.4 C) 98.6 F (37 C) 98.1 F (36.7 C)  TempSrc:  Axillary Axillary Oral  SpO2:  98% 98%   Weight:      Height:        General exam: Appears calm and comfortable Respiratory system: Clear to auscultation, diminished. Respiratory effort normal. Cardiovascular system: S1 & S2 heard, RRR. No murmurs, rubs, gallops or clicks. Gastrointestinal system: Abdomen is nondistended, soft and nontender. No organomegaly or masses felt. Normal bowel sounds heard. Central nervous system: Somnolent. Moans intermittently. Extremities: No edema. No calf tenderness Skin: No cyanosis.    The results of significant diagnostics from this hospitalization (including imaging, microbiology, ancillary and laboratory) are listed below for reference.     Microbiology: Recent Results (from the past 240 hour(s))  Culture, blood (Routine x 2)     Status: None (Preliminary result)   Collection Time: 11/09/17 10:07 PM  Result Value Ref Range Status   Specimen Description BLOOD LEFT HAND  Final   Special Requests IN PEDIATRIC BOTTLE Blood Culture adequate volume  Final   Culture  Final    NO GROWTH 3 DAYS Performed at [facility], [address] [place], [place] [address]    Report Status PENDING  Incomplete  Culture, blood (Routine x 2)     Status: None (Preliminary result)   Collection Time: 11/09/17 10:07 PM  Result Value Ref Range Status   Specimen Description BLOOD RIGHT WRIST  Final   Special Requests   Final    BOTTLES DRAWN AEROBIC AND ANAEROBIC Blood Culture adequate volume   Culture   Final    NO GROWTH 3 DAYS Performed at [facility], [address], [place], [place] [address]    Report Status PENDING  Incomplete  MRSA PCR Screening      Status: None   Collection Time: 11/10/17  2:09 AM  Result Value Ref Range Status   MRSA by PCR NEGATIVE NEGATIVE Final    Comment:        The GeneXpert MRSA Assay (FDA approved for NASAL specimens only), is one component of a comprehensive MRSA colonization surveillance program. It is not intended to diagnose MRSA infection nor to guide or monitor treatment for MRSA infections. Performed at [facility], [address] [place], [place] [address]      Labs: BNP (last 3 results) No results for input(s): BNP in the last 8760 hours. Basic Metabolic Panel: Recent Labs  Lab 11/11/17 0741 11/11/17 1118 11/11/17 1843 11/11/17 2100 11/12/17 0957 11/13/17 0250  NA 150* 152* 151*  --  143 143  K 3.1* 3.5 2.7*  --  3.3* 4.0  CL 122* 122* 123*  --  119* 116*  CO2 18* 17* 17*  --  18* 18*  GLUCOSE 124* 103* 121*  --  103* 84  BUN 22* 22* 19  --  12 8  CREATININE 1.31* 1.35* 1.38*  --  1.23* 1.15*  CALCIUM 8.0* 8.2* 8.0*  --  7.9* 8.0*  MG  --   --   --  1.8  --   --    Liver Function Tests: Recent Labs  Lab 11/09/17 2205  AST 19  ALT 14  ALKPHOS 65  BILITOT 1.0  PROT 7.9  ALBUMIN 2.9*   No results for input(s): LIPASE, AMYLASE in the last 168 hours. No results for input(s): AMMONIA in the last 168 hours. CBC: Recent Labs  Lab 11/09/17 2205 11/10/17 0242 11/11/17 0741 11/13/17 0840  WBC 25.4* 22.5* 15.1* 10.0  NEUTROABS 21.4*  --   --   --   HGB 13.0 12.2 11.4* 11.5*  HCT 41.2 39.5 37.0 36.2  MCV 95.6 96.1 95.6 91.6  PLT 328 284 275 281   Cardiac Enzymes: No results for input(s): CKTOTAL, CKMB, CKMBINDEX, TROPONINI in the last 168 hours. BNP: Invalid input(s): POCBNP CBG: Recent Labs  Lab 11/10/17 0846 11/11/17 0849 11/12/17 0818 11/13/17 0841  GLUCAP 142* 122* 85 94   D-Dimer No results for input(s): DDIMER in the last 72 hours. Hgb A1c No results for input(s): HGBA1C in the last 72 hours. Lipid Profile No results for input(s): CHOL,  HDL, LDLCALC, TRIG, CHOLHDL, LDLDIRECT in the last 72 hours. Thyroid function studies No results for input(s): TSH, T4TOTAL, T3FREE, THYROIDAB in the last 72 hours.  Invalid input(s): FREET3 Anemia work up No results for input(s): VITAMINB12, FOLATE, FERRITIN, TIBC, IRON, RETICCTPCT in the last 72 hours. Urinalysis    Component Value Date/Time   COLORURINE YELLOW 11/09/2017 2205   APPEARANCEUR HAZY (A) 11/09/2017 2205   LABSPEC  1.019 11/09/2017 2205   PHURINE 5.0 11/09/2017 2205   GLUCOSEU NEGATIVE 11/09/2017 2205   HGBUR NEGATIVE 11/09/2017 2205   BILIRUBINUR NEGATIVE 11/09/2017 2205   KETONESUR NEGATIVE 11/09/2017 2205   PROTEINUR 30 (A) 11/09/2017 2205   UROBILINOGEN 0.2 05/17/2012 1424   NITRITE NEGATIVE 11/09/2017 2205   LEUKOCYTESUR NEGATIVE 11/09/2017 2205   Sepsis Labs Invalid input(s): PROCALCITONIN,  WBC,  LACTICIDVEN Microbiology Recent Results (from the past 240 hour(s))  Culture, blood (Routine x 2)     Status: None (Preliminary result)   Collection Time: 11/09/17 10:07 PM  Result Value Ref Range Status   Specimen Description BLOOD LEFT HAND  Final   Special Requests IN PEDIATRIC BOTTLE Blood Culture adequate volume  Final   Culture   Final    NO GROWTH 3 DAYS Performed at [facility], [address] [place], [place] [address]    Report Status PENDING  Incomplete  Culture, blood (Routine x 2)     Status: None (Preliminary result)   Collection Time: 11/09/17 10:07 PM  Result Value Ref Range Status   Specimen Description BLOOD RIGHT WRIST  Final   Special Requests   Final    BOTTLES DRAWN AEROBIC AND ANAEROBIC Blood Culture adequate volume   Culture   Final    NO GROWTH 3 DAYS Performed at [facility], [address] [place], [place] [address]    Report Status PENDING  Incomplete  MRSA PCR Screening     Status: None   Collection Time: 11/10/17  2:09 AM  Result Value Ref Range Status   MRSA by PCR NEGATIVE NEGATIVE Final    Comment:         The GeneXpert MRSA Assay (FDA approved for NASAL specimens only), is one component of a comprehensive MRSA colonization surveillance program. It is not intended to diagnose MRSA infection nor to guide or monitor treatment for MRSA infections. Performed at [facility], [address] [place], [place] [address]      Time coordinating discharge: Over 30 minutes  SIGNED:   Jacquelin Hawkingalph Akeisha Lagerquist, MD [facility] 11/13/2017, 11:02 AM Pager [phone]  If 7PM-7AM, please contact night-coverage [email or web address] Password [email or web address]

## 2017-11-13 NOTE — Discharge Instructions (Signed)
Amanda [name],  You were admitted for:  1. Infection: unknown source. You were treated with antibiotics with improvement of your inflammatory counts. You will continue antibiotics on discharge empirically. 2. Hypernatremia (high sodium): this is because you are not eating much by mouth. This will likely be a recurring issue 3. Change in mental status: this is likely a combination of infection and hypernatremia. This improved with treatment, but will likely be a recurring issue. 4. Dehydration: you were given IV fluids.

## 2017-11-14 LAB — CULTURE, BLOOD (ROUTINE X 2)
CULTURE: NO GROWTH
Culture: NO GROWTH
SPECIAL REQUESTS: ADEQUATE
SPECIAL REQUESTS: ADEQUATE

## 2017-11-15 ENCOUNTER — Encounter: Payer: Self-pay | Admitting: Adult Health

## 2017-11-15 ENCOUNTER — Telehealth: Payer: Self-pay

## 2017-11-15 ENCOUNTER — Non-Acute Institutional Stay (SKILLED_NURSING_FACILITY): Payer: Medicare Other | Admitting: Adult Health

## 2017-11-15 DIAGNOSIS — G309 Alzheimer's disease, unspecified: Secondary | ICD-10-CM

## 2017-11-15 DIAGNOSIS — I10 Essential (primary) hypertension: Secondary | ICD-10-CM

## 2017-11-15 DIAGNOSIS — R6510 Systemic inflammatory response syndrome (SIRS) of non-infectious origin without acute organ dysfunction: Secondary | ICD-10-CM | POA: Diagnosis not present

## 2017-11-15 DIAGNOSIS — L89152 Pressure ulcer of sacral region, stage 2: Secondary | ICD-10-CM | POA: Diagnosis not present

## 2017-11-15 DIAGNOSIS — F0280 Dementia in other diseases classified elsewhere without behavioral disturbance: Secondary | ICD-10-CM

## 2017-11-15 DIAGNOSIS — E440 Moderate protein-calorie malnutrition: Secondary | ICD-10-CM | POA: Diagnosis not present

## 2017-11-15 DIAGNOSIS — E870 Hyperosmolality and hypernatremia: Secondary | ICD-10-CM | POA: Diagnosis not present

## 2017-11-15 DIAGNOSIS — G9341 Metabolic encephalopathy: Secondary | ICD-10-CM

## 2017-11-15 NOTE — Telephone Encounter (Signed)
Possible re-admission to facility. This is a patient you were seeing at [facility]. TOC - Hospital F/U is needed if patient was re-admitted to facility upon discharge. Hospital discharge from [facility] on 11/13/2017  

## 2017-11-15 NOTE — Progress Notes (Signed)
Location:  [facility] Room Number: 117-A Place of Service:  SNF (31) Provider:  Kenard GowerMedina-Vargas, Sevilla Murtagh, NP  Patient Care Team: Pecola LawlessHopper, William F, MD as PCP - General (Internal Medicine) Gillis SantaMedina-Vargas, Anias Bartol C, NP as Nurse Practitioner (Internal Medicine)  Extended Emergency Contact Information Primary Emergency Contact: Clapp,[name] Address: [address]          [place], [place] [address] [place] Home Phone: [phone] Mobile Phone: [phone] Relation: Spouse Secondary Emergency Contact: Romeo AppleWilliamson,[name] J  [place] Mobile Phone: [phone] Relation: Son  Code Status:  DNR  Goals of care: Advanced Directive information Advanced Directives 11/10/2017  Does Patient Have a Medical Advance Directive? Yes  Type of Advance Directive Out of facility DNR (pink MOST or yellow form)  Does patient want to make changes to medical advance directive? No - Patient declined  Copy of Healthcare Power of Attorney in Chart? -  Would patient like information on creating a medical advance directive? -  Pre-existing out of facility DNR order (yellow form or pink MOST form) Yellow form placed in chart (order not valid for inpatient use)     Chief Complaint  Patient presents with  . Acute Visit    [facility] for fever and decreased level of consciousness    HPI:  Pt is a 72 y.o. female seen today for hospital followup.  [facility] for fever and decreased level of consciousness. She was found to have wbc 24.4, lactic acid 1.37, Na 159. Chest x-ray is negative for acute issues. Blood cultures were negative. Family refused CT scan. Sacral x-ray without osteomyelitis. Sacral pressure ulcer was treated with Vancomycin and Zosyn empirically.She has a PMH of CKD, history of CVA in 2013, HTN, HLD, chronic  constipation, Alzheimer's disease, and is nonverbal and nonambulatory at baseline. She was seen in the room with husband and son at bedside. Husband  was asked regarding goals of care. He wants to do everything for the resident, "will not go down without a fight."  He wants resident to be hospitalized if needed.    Past Medical History:  Diagnosis Date  . Dementia    "alzheimer's" (05/18/2012)  . Fall 05/17/2012  . Fracture of medial wall of orbit (HCC) 05/17/2012  . Gout    "? feet" (05/18/2012); uric acid 9.4 on 08/06/16 with R wrist pain; 7.1 on 08/15/16 on Allopurinol  . Hypertension   . Incontinence of urine   . Stroke [facility](HCC) ~ 2003   "slight memory loss" (05/18/2012)  . Wrist fracture, bilateral 05/17/2012   "fell down steps" (05/18/2012)   Past Surgical History:  Procedure Laterality Date  . fractured tooth     01/06/17 Dr Ocie DoyneScott Jensen DMD extracted tooth  #4   . VAGINAL HYSTERECTOMY      Allergies  Allergen Reactions  . Pollen Extract Other (See Comments)    rhinitis    Outpatient Encounter Medications as of 11/15/2017  Medication Sig  . amoxicillin-clavulanate (AUGMENTIN) 500-125 MG tablet Take 1 tablet (500 mg total) by mouth 2 (two) times daily for 5 days.  Marland Kitchen. CALCIUM-VITAMIN D PO Take 500 mg by mouth daily.  . cloNIDine (CATAPRES) 0.1 MG tablet Take 0.1 mg by mouth 2 (two) times daily.   Marland Kitchen. doxycycline (VIBRAMYCIN) 50 MG capsule Take 1 capsule (50 mg total) by mouth 2 (two) times daily for 5 days.  Marland Kitchen. labetalol (NORMODYNE) 300 MG  tablet Take 300 mg by mouth 2 (two) times daily.  . Multiple Vitamins-Minerals (ELDERTONIC PO) Take 15 mLs by mouth 2 (two) times daily.  Marland Kitchen senna-docusate (SENOKOT-S) 8.6-50 MG tablet Take 1-2 tablets by mouth See admin instructions. Take 1 tablet QAM and 2 tablets QPM   No facility-administered encounter medications on file as of 11/15/2017.     Review of Systems  Unable to obtain due to dementia    Immunization History  Administered Date(s)  Administered  . Influenza-Unspecified 05/10/2013, 05/22/2015, 05/14/2016, 05/23/2017  . Pneumococcal-Unspecified 06/03/2012, 05/14/2016   Pertinent  Health Maintenance Due  Topic Date Due  . PNA vac Low Risk Adult (2 of 2 - PCV13) 07/21/2018 (Originally 05/14/2017)  . MAMMOGRAM  03/09/2024 (Originally 11/26/2014)  . COLONOSCOPY  03/09/2024 (Originally 09/25/1993)  . INFLUENZA VACCINE  03/10/2018  . DEXA SCAN  Discontinued   Fall Risk  04/06/2017 09/23/2016 09/11/2016 06/10/2016 04/10/2016  Falls in the past year? Yes No No No Exclusion - non ambulatory  Number falls in past yr: 1 - - - -  Injury with Fall? Yes - - - -      Vitals:   11/15/17 1133  BP: (!) 106/57  Pulse: 75  Resp: 20  Temp: 99.8 F (37.7 C)  TempSrc: Oral  SpO2: 93%  Weight: 112 lb 14.1 oz (51.2 kg)  Height: 5\' 1"  (1.549 m)   Body mass index is 21.33 kg/m.  Physical Exam  GENERAL APPEARANCE:  In no acute distress. Normal body habitus SKIN:  Sacral pressure ulcer stage 2 MOUTH and THROAT: Lips are without lesions. Oral mucosa is moist and without lesions.  RESPIRATORY: Breathing is even & unlabored, BS CTAB CARDIAC: RRR, no murmur,no extra heart sounds, no edema GI: Abdomen soft, normal BS, no masses, no tenderness EXTREMITIES:  Does not move X 4 extremities PSYCHIATRIC: . Affect and behavior are appropriate  Labs reviewed: Recent Labs    08/11/17 1349  08/13/17 0431 08/14/17 0214  11/11/17 1843 11/11/17 2100 11/12/17 0957 11/13/17 0250  NA 156*   < > 150* 148*   < > 151*  --  143 143  K 3.5   < > 3.1* 3.5   < > 2.7*  --  3.3* 4.0  CL 128*   < > 123* 120*   < > 123*  --  119* 116*  CO2 20*   < > 18* 18*   < > 17*  --  18* 18*  GLUCOSE 97   < > 135* 115*   < > 121*  --  103* 84  BUN 40*   < > 19 9   < > 19  --  12 8  CREATININE 1.95*   < > 1.51* 1.33*   < > 1.38*  --  1.23* 1.15*  CALCIUM 9.2   < > 8.5* 8.8*   < > 8.0*  --  7.9* 8.0*  MG 2.5*  --  2.0 1.8  --   --  1.8  --   --   PHOS 3.5  --   --    --   --   --   --   --   --    < > = values in this interval not displayed.   Recent Labs    08/11/17 0736 08/12/17 0249 11/09/17 2205  AST 18 16 19   ALT 15 12* 14  ALKPHOS 77 71 65  BILITOT 0.6 1.0 1.0  PROT 8.3* 7.1 7.9  ALBUMIN 3.7 3.2*  2.9*   Recent Labs    08/11/17 0736  08/14/17 2210  11/09/17 2205 11/10/17 0242 11/11/17 0741 11/13/17 0840  WBC 12.3*   < > 11.0*   < > 25.4* 22.5* 15.1* 10.0  NEUTROABS 9.5*  --  8.5*  --  21.4*  --   --   --   HGB 13.6   < > 14.8   < > 13.0 12.2 11.4* 11.5*  HCT 44.3   < > 44.4   < > 41.2 39.5 37.0 36.2  MCV 98.7   < > 92.1   < > 95.6 96.1 95.6 91.6  PLT 285   < > PLATELET CLUMPS NOTED ON SMEAR, COUNT APPEARS ADEQUATE   < > 328 284 275 281   < > = values in this interval not displayed.   Lab Results  Component Value Date   TSH 1.61 09/18/2016    Lab Results  Component Value Date   CHOL 126 12/17/2016   HDL 35 12/17/2016   LDLCALC 69 12/17/2016   TRIG 109 12/17/2016    Significant Diagnostic Results in last 30 days:  Dg Sacrum/coccyx  Result Date: 11/11/2017 CLINICAL DATA:  Pressure ulcer on sacrum. EXAM: SACRUM AND COCCYX - 2+ VIEW COMPARISON:  CT 08/16/2017. FINDINGS: No acute bony or joint abnormality. No evidence of fracture. Aortic atherosclerotic vascular calcification. Surgical screws noted over the anterior lower lumbar spine. IMPRESSION: 1. Diffuse osteopenia and degenerative change. No acute abnormality. 2.  Aortoiliac atherosclerotic vascular calcification. Electronically Signed   By: Maisie Fus  Register   On: 11/11/2017 09:39   Dg Chest Portable 1 View  Result Date: 11/09/2017 CLINICAL DATA:  Sepsis. EXAM: PORTABLE CHEST 1 VIEW COMPARISON:  Chest x-ray dated August 15, 2017. FINDINGS: The patient is rotated to the right. Cardiomegaly. Tortuous, aneurysmal thoracic aorta, better evaluated on prior CT. Normal pulmonary vascularity. Streaky atelectasis in the right mid lung. No focal consolidation, pleural effusion, or  pneumothorax. No acute osseous abnormality. IMPRESSION: 1. Limited evaluation due to patient rotation.  No active disease. Electronically Signed   By: Obie Dredge M.D.   On: 11/09/2017 22:13    Assessment/Plan  1. SIRS (systemic inflammatory response syndrome) (HCC) - no definite source of infection, Urinalysis is negative for infection,, blood culture is negative, sacral x-ray is negative for osteomyelitis, was treated with Vancomycin and Zosyn empirically, and switched on Doxycycline and Augmentin   2. Acute metabolic encephalopathy - family refused CT scan, , improved after treatment of hypernatremia and possible infection   3. Hypernatremia - was given IVF D5 1/2 NS, latest Na 143   4. Malnutrition of moderate degree - comfort feeds, husband comes to feed her, continue Eldertonic 15 ml BID   5. Essential hypertension, benign - continue Clonidine 0.1 mg 1 tab BID, Labetalol 300 mg 1 tab BID   6. Alzheimer's dementia without behavioral disturbance, unspecified timing of dementia onset - comfort care/hospice, continue supportive care, fall precautions   7.  Pressure ulcer, sacral stage 2 - no osteomyelitis, treatment per facility protocol, keep skin clean and dry, turn to sides    Family/ staff Communication:  Discussed plan of care with husband and son.  Labs/tests ordered:  None  Goals of care:   Long-term care/Hospice.   Kenard Gower, NP [facility] [phone] (Monday-Friday 8:00 a.m. - 5:00 p.m.) [phone] (after hours)

## 2017-11-16 ENCOUNTER — Non-Acute Institutional Stay (SKILLED_NURSING_FACILITY): Payer: Medicare Other | Admitting: Internal Medicine

## 2017-11-16 ENCOUNTER — Encounter: Payer: Self-pay | Admitting: Internal Medicine

## 2017-11-16 DIAGNOSIS — N179 Acute kidney failure, unspecified: Secondary | ICD-10-CM | POA: Diagnosis not present

## 2017-11-16 DIAGNOSIS — N183 Chronic kidney disease, stage 3 (moderate): Secondary | ICD-10-CM

## 2017-11-16 DIAGNOSIS — Z66 Do not resuscitate: Secondary | ICD-10-CM

## 2017-11-16 DIAGNOSIS — R6510 Systemic inflammatory response syndrome (SIRS) of non-infectious origin without acute organ dysfunction: Secondary | ICD-10-CM | POA: Diagnosis not present

## 2017-11-16 DIAGNOSIS — F0151 Vascular dementia with behavioral disturbance: Secondary | ICD-10-CM

## 2017-11-16 DIAGNOSIS — F01518 Vascular dementia, unspecified severity, with other behavioral disturbance: Secondary | ICD-10-CM

## 2017-11-16 DIAGNOSIS — G9341 Metabolic encephalopathy: Secondary | ICD-10-CM | POA: Diagnosis not present

## 2017-11-16 NOTE — Patient Instructions (Signed)
See assessment and plan under each diagnosis in the problem list and acutely for this visit 

## 2017-11-16 NOTE — Assessment & Plan Note (Addendum)
Complete course of  Doxycycline and Augmentin

## 2017-11-16 NOTE — Assessment & Plan Note (Addendum)
  An incredibly sensitive and compassionate discussion of end of life health issues is found in the book Being Mortal by Dr Vivi FernsAtul Gawande. Her son had read this; he will attempt to get his father to read it  Mr Amanda [name] is having a difficult time accepting the terminal nature of his wife's illnesses

## 2017-11-16 NOTE — Assessment & Plan Note (Addendum)
I defer to Hospice as to need to discuss continuing their care if aggressive intervention continues to be the demand of her husband

## 2017-11-16 NOTE — Progress Notes (Signed)
    NURSING HOME LOCATION:  [facility] ROOM NUMBER:  117-A  CODE STATUS:  DNR  PCP:  Pecola LawlessHopper, Huda Petrey F, MD  [address] [place] [address]  This is a Nursing Facility readmission within 30 days  Interim medical record and care since [facility] was updated with review of diagnostic studies and change in clinical status since last visit were documented.  HPI: The patient was hospitalized 4/2-11/13/17 with fever and decreased level of consciousness. Even at baseline the patient is nonverbal with no significant spontaneous muscular activity. At the SNF she was found to have a temperature of 103 with intermittent snoring respirations. White count 24,400, lactic acid 1.37, sodium 159, AKI on CKD documented in ED. Urinalysis and chest x-ray were negative for acute process. Imaging did not suggest to osteomyelitis despite the presence of a sacral ulcer. Clinically  she had SIRS without definite source. Empirically she received vancomycin and Zosyn ,but she transitioned to doxycycline and Augmentin. CT scan was recommended to evaluate encephalopathy but declined by the family. This did improve with treatment of possible infection and hypernatremia. The hyponatremia was attributed to decreased oral intake. Creatinine did rise to 2.09, baseline is considered to be 1.5. Despite presumed sepsis, the patient remained hypertensive. Hospice has been following the patient, but the husband has repeatedly requested aggressive intervention such as IV fluids. Recommendation was made in the discharge summary to avoid rehospitalization for dehydration/mental status changes as this is a chronic issue with expected recurrence with her terminal state. Labs 11/13/17 revealed a creatinine of 1.15 and GFR of 53. Gout was normal, she had a mild anemia with hemoglobin 11.5. The anemia was normochromic, normocytic with a normal RDW  Review of systems: Nonverbal state prevented any review of systems.No  family @ bedside  Physical exam:  Pertinent or positive findings: Pattern alopecia is present. She is non-communicative & non interactive. Chest surprisingly clear. Rhythm is regular, second heart sound is increased. Pedal pulses are decreased. She has minimal tenting. Extremities have increased tone. The upper extremities are flexed across his chest and hands exhibit contractures.  General appearance: Adequately nourished; no acute distress, increased work of breathing is present.   Lymphatic: No lymphadenopathy about the head, neck, axilla. Eyes: No conjunctival inflammation or lid edema is present. There is no scleral icterus. Ears:  External ear exam shows no significant lesions or deformities.   Nose:  External nasal examination shows no deformity or inflammation. Nasal mucosa are pink and moist without lesions, exudates Oral exam:  Lips and gums are healthy appearing. There is no oropharyngeal erythema or exudate. Neck:  No thyromegaly, masses, tenderness noted.    Heart:  No gallop, murmur, click, rub .  Lungs: without wheezes, rhonchi,rales , rubs. Abdomen:Bowel sounds are normal. Abdomen is soft and nontender with no organomegaly, hernias,masses. GU: deferred  Extremities:  No cyanosis, clubbing,edema  Skin: Warm & dry . No significant rash.  See summary under each active problem in the Problem List with associated updated therapeutic plan

## 2017-11-16 NOTE — Assessment & Plan Note (Signed)
Monitor will depend on family wishes

## 2017-11-17 ENCOUNTER — Encounter: Payer: Self-pay | Admitting: Internal Medicine

## 2017-11-17 NOTE — Assessment & Plan Note (Signed)
Back to non verbal, non interactive  baseline

## 2017-12-13 ENCOUNTER — Encounter: Payer: Self-pay | Admitting: Adult Health

## 2017-12-13 ENCOUNTER — Non-Acute Institutional Stay (SKILLED_NURSING_FACILITY): Payer: Medicare Other | Admitting: Adult Health

## 2017-12-13 DIAGNOSIS — I10 Essential (primary) hypertension: Secondary | ICD-10-CM

## 2017-12-13 DIAGNOSIS — G309 Alzheimer's disease, unspecified: Secondary | ICD-10-CM | POA: Diagnosis not present

## 2017-12-13 DIAGNOSIS — L89150 Pressure ulcer of sacral region, unstageable: Secondary | ICD-10-CM

## 2017-12-13 DIAGNOSIS — K5909 Other constipation: Secondary | ICD-10-CM

## 2017-12-13 DIAGNOSIS — F0280 Dementia in other diseases classified elsewhere without behavioral disturbance: Secondary | ICD-10-CM

## 2017-12-13 NOTE — Progress Notes (Signed)
Location:  [facility] Room Number: 117-A Place of Service:  SNF (31) Provider:  Kenard Gower, NP  Patient Care Team: Pecola Lawless, MD as PCP - General (Internal Medicine) Gillis Santa, NP as Nurse Practitioner (Internal Medicine)  Extended Emergency Contact Information Primary Emergency Contact: Ismael,[name] Address: [address]          [place], [place] [address] [place] Home Phone: [phone] Mobile Phone: [phone] Relation: Spouse Secondary Emergency Contact: Romeo [place] Mobile Phone: [phone] Relation: Son  Code Status:  DNR  Goals of care: Advanced Directive information Advanced Directives 11/16/2017  Does Patient Have a Medical Advance Directive? Yes  Type of Advance Directive Out of facility DNR (pink MOST or yellow form)  Does patient want to make changes to medical advance directive? No - Patient declined  Copy of Healthcare Power of Attorney in Chart? -  Would patient like information on creating a medical advance directive? -  Pre-existing out of facility DNR order (yellow form or pink MOST form) Pink MOST form placed in chart (order not valid for inpatient use)     Chief Complaint  Patient presents with  . Medical Management of Chronic Issues    Routine [facility] visit    HPI:  Amanda [name] is a 72 y.o. female seen today for medical management of chronic diseases.  She is a long-term resident of long-term care resident of [facility].  She has a PMH of CKD, CVA in 2013, HTN, HLD, chronic constipation, Alzheimer's disease, and is nonambulatory and nonverbal at baseline. She was seen in the room with husband at bedside. Husband was feeding her and she was chewing. Her eyes were open. Husband revoked hospice care. Husband said that "if his wife needs to be hospitalized, she needs to go immediately and not wait for someone. I want her to be  taken cared well."   Past Medical History:  Diagnosis Date  . Dementia    "alzheimer's" (05/18/2012)  . Fall 05/17/2012  . Fracture of medial wall of orbit (HCC) 05/17/2012  . Gout    "? feet" (05/18/2012); uric acid 9.4 on 08/06/16 with R wrist pain; 7.1 on 08/15/16 on Allopurinol  . Hypertension   . Incontinence of urine   . Stroke [facility]) ~ 2003   "slight memory loss" (05/18/2012)  . Wrist fracture, bilateral 05/17/2012   "fell down steps" (05/18/2012)   Past Surgical History:  Procedure Laterality Date  . fractured tooth     01/06/17 Dr Ocie Doyne DMD extracted tooth  #4   . VAGINAL HYSTERECTOMY      Allergies  Allergen Reactions  . Pollen Extract Other (See Comments)    rhinitis    Outpatient Encounter Medications as of 12/13/2017  Medication Sig  . CALCIUM-VITAMIN D PO Take 500 mg by mouth daily.  . cloNIDine (CATAPRES) 0.1 MG tablet Take 0.1 mg by mouth 2 (two) times daily.   Marland Kitchen labetalol (NORMODYNE) 300 MG tablet Take 300 mg by mouth 2 (two) times daily.  . Multiple Vitamins-Minerals (ELDERTONIC PO) Take 15 mLs by mouth 2 (two) times daily.  Marland Kitchen senna-docusate (SENOKOT-S) 8.6-50 MG tablet Take 1-2 tablets by mouth See admin instructions. Take 1 tablet QAM and 2 tablets QPM   No facility-administered encounter medications on file as of 12/13/2017.     Review of Systems  Unable to obtain due to dementia    Immunization History  Administered Date(s) Administered  . Influenza-Unspecified  05/10/2013, 05/22/2015, 05/14/2016, 05/23/2017  . Pneumococcal-Unspecified 06/03/2012, 05/14/2016   Pertinent  Health Maintenance Due  Topic Date Due  . PNA vac Low Risk Adult (2 of 2 - PCV13) 07/21/2018 (Originally 05/14/2017)  . MAMMOGRAM  03/09/2024 (Originally 11/26/2014)  . COLONOSCOPY  03/09/2024 (Originally 09/25/1993)  . INFLUENZA VACCINE  03/10/2018  . DEXA SCAN  Discontinued   Fall Risk  04/06/2017 09/23/2016 09/11/2016 06/10/2016 04/10/2016  Falls in the past year? Yes No No No  Exclusion - non ambulatory  Number falls in past yr: 1 - - - -  Injury with Fall? Yes - - - -      Vitals:   12/13/17 0945  BP: 129/83  Pulse: 72  Resp: 20  Temp: (!) 97 F (36.1 C)  TempSrc: Oral  SpO2: 95%  Weight: 122 lb 9.6 oz (55.6 kg)  Height:  (1.549 m)   Body mass index is 23.17 kg/m.  Physical Exam  GENERAL APPEARANCE: Well nourished. In no acute distress. Normal body habitus SKIN:  Sacral pressure ulcer measuring 2.2 X2 X unknown with 80% mixed tan yellow slough, 20% pale red, small amount serous drainage MOUTH and THROAT: Lips are without lesions, grinds teeth RESPIRATORY: Breathing is even & unlabored, BS CTAB CARDIAC: RRR, no murmur,no extra heart sounds, no edema GI: Abdomen soft, normal BS, no masses, no tenderness EXTREMITIES:  Not moving NEURO:  Nonverbal, does not move extremities PSYCHIATRIC:  Affect and behavior are appropriate  Labs reviewed: Recent Labs    08/11/17 1349  08/13/17 0431 08/14/17 0214  11/11/17 1843 11/11/17 2100 11/12/17 0957 11/13/17 0250  NA 156*   < > 150* 148*   < > 151*  --  143 143  K 3.5   < > 3.1* 3.5   < > 2.7*  --  3.3* 4.0  CL 128*   < > 123* 120*   < > 123*  --  119* 116*  CO2 20*   < > 18* 18*   < > 17*  --  18* 18*  GLUCOSE 97   < > 135* 115*   < > 121*  --  103* 84  BUN 40*   < > 19 9   < > 19  --  12 8  CREATININE 1.95*   < > 1.51* 1.33*   < > 1.38*  --  1.23* 1.15*  CALCIUM 9.2   < > 8.5* 8.8*   < > 8.0*  --  7.9* 8.0*  MG 2.5*  --  2.0 1.8  --   --  1.8  --   --   PHOS 3.5  --   --   --   --   --   --   --   --    < > = values in this interval not displayed.   Recent Labs    08/11/17 0736 08/12/17 0249 11/09/17 2205  AST ALT 15 12* 14  ALKPHOS 77 71 65  BILITOT 0.6 1.0 1.0  PROT 8.3* 7.1 7.9  ALBUMIN 3.7 3.2* 2.9*   Recent Labs    08/11/17 0736  08/14/17 2210  11/09/17 2205 11/10/17 0242 11/11/17 0741 11/13/17 0840  WBC 12.3*   < > 11.0*   < > 25.4* 22.5* 15.1* 10.0    NEUTROABS 9.5*  --  8.5*  --  21.4*  --   --   --   HGB 13.6   < > 14.8   < >  13.0 12.2 11.4* 11.5*  HCT 44.3   < > 44.4   < > 41.2 39.5 37.0 36.2  MCV 98.7   < > 92.1   < > 95.6 96.1 95.6 91.6  PLT 285   < > PLATELET CLUMPS NOTED ON SMEAR, COUNT APPEARS ADEQUATE   < > 328 284 275 281   < > = values in this interval not displayed.   Lab Results  Component Value Date   TSH 1.61 09/18/2016    Lab Results  Component Value Date   CHOL 126 12/17/2016   HDL 35 12/17/2016   LDLCALC 69 12/17/2016   TRIG 109 12/17/2016    Assessment/Plan  1. Essential hypertension, benign - well-controlled, continue Clonidine 0.1 mg 1 tab BID and Labetalol 300 mg 1 tab BID   2. Chronic constipation - continue Senna- S  1 tab Q AM and 2 tabs Q evening   3. Pressure injury of sacral region, unstageable (HCC) - continue treatment daily, followed-up by wound consultant, turn to sides when in bed   4. Alzheimer's dementia without behavioral disturbance, unspecified timing of dementia onset - she is no longer hospice and wants her to be sent out if needing hospitalization, continue supportive care     Family/ staff Communication: Discussed plan of care with husband.  Labs/tests ordered:  None  Goals of care:   Long-term care   Kenard Gower, NP [facility] [phone] (Monday-Friday 8:00 a.m. - 5:00 p.m.) [phone] (after hours)

## 2018-01-10 ENCOUNTER — Encounter: Payer: Self-pay | Admitting: Adult Health

## 2018-01-10 ENCOUNTER — Non-Acute Institutional Stay (SKILLED_NURSING_FACILITY): Payer: Medicare Other | Admitting: Adult Health

## 2018-01-10 DIAGNOSIS — G309 Alzheimer's disease, unspecified: Secondary | ICD-10-CM | POA: Diagnosis not present

## 2018-01-10 DIAGNOSIS — Z8673 Personal history of transient ischemic attack (TIA), and cerebral infarction without residual deficits: Secondary | ICD-10-CM | POA: Diagnosis not present

## 2018-01-10 DIAGNOSIS — K5901 Slow transit constipation: Secondary | ICD-10-CM | POA: Diagnosis not present

## 2018-01-10 DIAGNOSIS — L89150 Pressure ulcer of sacral region, unstageable: Secondary | ICD-10-CM

## 2018-01-10 DIAGNOSIS — I10 Essential (primary) hypertension: Secondary | ICD-10-CM | POA: Diagnosis not present

## 2018-01-10 DIAGNOSIS — F0280 Dementia in other diseases classified elsewhere without behavioral disturbance: Secondary | ICD-10-CM

## 2018-01-10 NOTE — Progress Notes (Signed)
Location:  [facility] Room Number: 117-A Place of Service:  SNF (31) Provider:  Kenard Gower, NP  Patient Care Team: Pecola Lawless, MD as PCP - General (Internal Medicine) Gillis Santa, NP as Nurse Practitioner (Internal Medicine)  Extended Emergency Contact Information Primary Emergency Contact: Howley,[name] Address: [address]          [place], [place] [address] [place] of [place] Home Phone: [phone] Mobile Phone: [phone] Relation: Spouse Secondary Emergency Contact: Romeo Apple [place] of [place] Mobile Phone: [phone] Relation: Son  Code Status:  DNR  Goals of care: Advanced Directive information Advanced Directives 12/13/2017  Does Patient Have a Medical Advance Directive? Yes  Type of Advance Directive Out of facility DNR (pink MOST or yellow form)  Does patient want to make changes to medical advance directive? No - Patient declined  Copy of Healthcare Power of Attorney in Chart? -  Would patient like information on creating a medical advance directive? -  Pre-existing out of facility DNR order (yellow form or pink MOST form) -     Chief Complaint  Patient presents with  . Medical Management of Chronic Issues    Patient is seen for a routine [facility] visit    HPI:  Pt is a 72 y.o. female seen today for medical management of chronic diseases.  She is a long-term care resident of [facility].  She has a PMH of CKD, CVA in 2013, hypertension, HLD, chronic constipation, Alzheimer's disease, is nonambulatory and nonverbal at baseline. BPs are stable, 137/70, 136/70. She was seen in the room and just finished breakfast. Husband feeds her breakfast. She was seen with her eyes closed and would open them a little bit when verbally greeted. She is no longer under hospice care. Husband was at bedside and verbalized that he prefers her to be off hospice at this time.      Past Medical History:  Diagnosis Date  . Dementia    "alzheimer's" (05/18/2012)  . Fall 05/17/2012  . Fracture of medial wall of orbit (HCC) 05/17/2012  . Gout    "? feet" (05/18/2012); uric acid 9.4 on 08/06/16 with R wrist pain; 7.1 on 08/15/16 on Allopurinol  . Hypertension   . Incontinence of urine   . Stroke [facility]) ~ 2003   "slight memory loss" (05/18/2012)  . Wrist fracture, bilateral 05/17/2012   "fell down steps" (05/18/2012)   Past Surgical History:  Procedure Laterality Date  . fractured tooth     01/06/17 Dr Ocie Doyne DMD extracted tooth  #4   . VAGINAL HYSTERECTOMY      Allergies  Allergen Reactions  . Pollen Extract Other (See Comments)    rhinitis    Outpatient Encounter Medications as of 01/10/2018  Medication Sig  . CALCIUM-VITAMIN D PO Take 500 mg by mouth daily.  . cloNIDine (CATAPRES) 0.1 MG tablet Take 0.1 mg by mouth 2 (two) times daily.   Marland Kitchen labetalol (NORMODYNE) 300 MG tablet Take 300 mg by mouth 2 (two) times daily.  . Multiple Vitamins-Minerals (ELDERTONIC PO) Take 15 mLs by mouth 2 (two) times daily.  Marland Kitchen senna-docusate (SENOKOT-S) 8.6-50 MG tablet Take 1-2 tablets by mouth See admin instructions. Take 1 tablet QAM and 2 tablets QPM  . sodium fluoride (PREVIDENT 5000 PLUS) 1.1 % CREA dental cream Place 1 application onto teeth every evening.   No facility-administered encounter medications on file as of 01/10/2018.     Review of Systems Unable to  obtain due to dementia    Immunization History  Administered Date(s) Administered  . Influenza-Unspecified 05/10/2013, 05/22/2015, 05/14/2016, 05/23/2017  . Pneumococcal-Unspecified 06/03/2012, 05/14/2016   Pertinent  Health Maintenance Due  Topic Date Due  . PNA vac Low Risk Adult (2 of 2 - PCV13) 07/21/2018 (Originally 05/14/2017)  . MAMMOGRAM  03/09/2024 (Originally 11/26/2014)  . COLONOSCOPY  03/09/2024 (Originally 09/25/1993)  . INFLUENZA VACCINE  03/10/2018  . DEXA SCAN  Discontinued   Fall Risk   04/06/2017 09/23/2016 09/11/2016 06/10/2016 04/10/2016  Falls in the past year? Yes No No No Exclusion - non ambulatory  Number falls in past yr: 1 - - - -  Injury with Fall? Yes - - - -      Vitals:   01/10/18 1143  BP: 137/70  Pulse: 72  Resp: 16  Temp: 97.9 F (36.6 C)  TempSrc: Oral  SpO2: 95%  Weight: 122 lb 9.6 oz (55.6 kg)  Height: 5\' 1"  (1.549 m)   Body mass index is 23.17 kg/m.  Physical Exam  GENERAL APPEARANCE: Well nourished. In no acute distress. Normal body habitus SKIN:  Has sacral ulcer measuring 1 X 0.7 X 0.2 cm with pale red wound base, scant serous drainage MOUTH and THROAT: Lips are without lesions. Oral mucosa is moist and without lesions.  RESPIRATORY: Breathing is even & unlabored, BS CTAB CARDIAC: RRR, no murmur,no extra heart sounds, no edema GI: Abdomen soft, normal BS, no masses, no tenderness EXTREMITIES:  Does not move extremities PSYCHIATRIC:. Affect and behavior are appropriate   Labs reviewed: Recent Labs    08/11/17 1349  08/13/17 0431 08/14/17 0214  11/11/17 1843 11/11/17 2100 11/12/17 0957 11/13/17 0250  NA 156*   < > 150* 148*   < > 151*  --  143 143  K 3.5   < > 3.1* 3.5   < > 2.7*  --  3.3* 4.0  CL 128*   < > 123* 120*   < > 123*  --  119* 116*  CO2 20*   < > 18* 18*   < > 17*  --  18* 18*  GLUCOSE 97   < > 135* 115*   < > 121*  --  103* 84  BUN 40*   < > 19 9   < > 19  --  12 8  CREATININE 1.95*   < > 1.51* 1.33*   < > 1.38*  --  1.23* 1.15*  CALCIUM 9.2   < > 8.5* 8.8*   < > 8.0*  --  7.9* 8.0*  MG 2.5*  --  2.0 1.8  --   --  1.8  --   --   PHOS 3.5  --   --   --   --   --   --   --   --    < > = values in this interval not displayed.   Recent Labs    08/11/17 0736 08/12/17 0249 11/09/17 2205  AST 18 16 19   ALT 15 12* 14  ALKPHOS 77 71 65  BILITOT 0.6 1.0 1.0  PROT 8.3* 7.1 7.9  ALBUMIN 3.7 3.2* 2.9*   Recent Labs    08/11/17 0736  08/14/17 2210  11/09/17 2205 11/10/17 0242 11/11/17 0741 11/13/17 0840  WBC  12.3*   < > 11.0*   < > 25.4* 22.5* 15.1* 10.0  NEUTROABS 9.5*  --  8.5*  --  21.4*  --   --   --  HGB 13.6   < > 14.8   < > 13.0 12.2 11.4* 11.5*  HCT 44.3   < > 44.4   < > 41.2 39.5 37.0 36.2  MCV 98.7   < > 92.1   < > 95.6 96.1 95.6 91.6  PLT 285   < > PLATELET CLUMPS NOTED ON SMEAR, COUNT APPEARS ADEQUATE   < > 328 284 275 281   < > = values in this interval not displayed.   Lab Results  Component Value Date   TSH 1.61 09/18/2016    Lab Results  Component Value Date   CHOL 126 12/17/2016   HDL 35 12/17/2016   LDLCALC 69 12/17/2016   TRIG 109 12/17/2016    Assessment/Plan  1. History of stroke - stable,continue labetalol 300 mg 1 tab twice a day and clonidine .1 mg 1 tab twice a day   2. Pressure injury of sacral region, unstageable (HCC) - continue treatment with  silver collagen and dressing daily, turn to sides when in bed, air mattress, monitor for infection   3. Essential hypertension, benign - well-controlled, continue clonidine 0.1 mg 1 tab twice a daycandclabetalol 300 mg 1 tab twice a day   4. Slow transit constipation - CNA reported that she moves her bowels daily, continue Senexon-S  1 Tab every morning and 2 tabs every evening   5. Alzheimer's dementia without behavioral disturbance, unspecified timing of dementia onset - no longer followed by hospice at this time, continue supportive care, fall precautions    Family/ staff Communication: Discussed plan of care with husband.  Labs/tests ordered:  None  Goals of care:   Long-term care.   Kenard Gower, NP [facility] [phone] (Monday-Friday 8:00 a.m. - 5:00 p.m.) [phone] (after hours)

## 2018-01-13 DIAGNOSIS — R1311 Dysphagia, oral phase: Secondary | ICD-10-CM | POA: Diagnosis not present

## 2018-01-13 DIAGNOSIS — N183 Chronic kidney disease, stage 3 (moderate): Secondary | ICD-10-CM | POA: Diagnosis not present

## 2018-02-04 DIAGNOSIS — I739 Peripheral vascular disease, unspecified: Secondary | ICD-10-CM | POA: Diagnosis not present

## 2018-02-04 DIAGNOSIS — R262 Difficulty in walking, not elsewhere classified: Secondary | ICD-10-CM | POA: Diagnosis not present

## 2018-02-04 DIAGNOSIS — B351 Tinea unguium: Secondary | ICD-10-CM | POA: Diagnosis not present

## 2018-02-04 LAB — HM DIABETES FOOT EXAM

## 2018-02-08 ENCOUNTER — Non-Acute Institutional Stay (SKILLED_NURSING_FACILITY): Payer: Medicare Other | Admitting: Internal Medicine

## 2018-02-08 ENCOUNTER — Encounter: Payer: Self-pay | Admitting: Internal Medicine

## 2018-02-08 DIAGNOSIS — F0151 Vascular dementia with behavioral disturbance: Secondary | ICD-10-CM

## 2018-02-08 DIAGNOSIS — I10 Essential (primary) hypertension: Secondary | ICD-10-CM | POA: Diagnosis not present

## 2018-02-08 DIAGNOSIS — F01518 Vascular dementia, unspecified severity, with other behavioral disturbance: Secondary | ICD-10-CM

## 2018-02-08 NOTE — Patient Instructions (Signed)
See assessment and plan under each diagnosis in the problem list and acutely for this visit 

## 2018-02-08 NOTE — Assessment & Plan Note (Addendum)
There is no change in her advanced cognitive deficit. No behavioral issues have been voiced by the staff other than resisting exam & completion of ADLs.

## 2018-02-08 NOTE — Assessment & Plan Note (Signed)
BP controlled; no change in antihypertensive medications  

## 2018-02-08 NOTE — Progress Notes (Signed)
    NURSING HOME LOCATION:  [facility] ROOM NUMBER:  117-A  CODE STATUS:  DNR  PCP:  Pecola LawlessHopper, William F, MD  [address] [place] [id number]  This is a nursing facility follow up of chronic medical diagnoses    Interim medical record and care since [facility] was updated with review of diagnostic studies and change in clinical status since last visit were documented.  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 The patient is no longer on Hospice, but she remains a DO NOT RESUSCITATE.  Her husband made the comment that she needs more exercise to improve her clinical condition. He is a former [employer] PE teacher. Labs are not current because of her DO NOT RESUSCITATE status.  Review of systems: Could not be completed as patient is non verbal & non interactive  Physical exam:  Pertinent or positive findings: Pattern alopecia present. Dentition is poor. Exam is limited as the patient clenches her lips closed. Second heart sound is increased. Arms are flexed across her chest with hand contractures. When her legs are examined she exhibits jerking type activity of the feet. Pedal pulses are decreased.  General appearance: Adequately nourished; no acute distress, increased work of breathing is present.   Lymphatic: No lymphadenopathy about the head, neck, axilla. Eyes: No conjunctival inflammation or lid edema is present. There is no scleral icterus. Ears:  External ear exam shows no significant lesions or deformities.   Nose:  External nasal examination shows no deformity or inflammation. Nasal mucosa are pink and moist without lesions, exudates Oral exam:  Lips and gums are healthy appearing.  Neck:  No thyromegaly, masses, tenderness noted.    Heart:  Normal rate and regular rhythm. S1 and S2 normal without gallop, murmur, click, rub .  Lungs: Chest clear to auscultation without  wheezes, rhonchi, rales, rubs. Abdomen: Bowel sounds are normal. Abdomen is soft and nontender with no organomegaly, hernias, masses. GU: Deferred  Extremities:  No cyanosis, clubbing, edema  Neurologic exam : Balance, Rhomberg, finger to nose testing could not be completed due to clinical state Skin: Warm & dry w/o tenting. No significant rash.  See summary under each active problem in the Problem List with associated updated therapeutic plan

## 2018-02-09 ENCOUNTER — Encounter: Payer: Self-pay | Admitting: Internal Medicine

## 2018-03-14 ENCOUNTER — Encounter: Payer: Self-pay | Admitting: Adult Health

## 2018-03-14 ENCOUNTER — Non-Acute Institutional Stay (SKILLED_NURSING_FACILITY): Payer: Medicare Other | Admitting: Adult Health

## 2018-03-14 DIAGNOSIS — Z8673 Personal history of transient ischemic attack (TIA), and cerebral infarction without residual deficits: Secondary | ICD-10-CM | POA: Diagnosis not present

## 2018-03-14 DIAGNOSIS — K5901 Slow transit constipation: Secondary | ICD-10-CM

## 2018-03-14 DIAGNOSIS — R1310 Dysphagia, unspecified: Secondary | ICD-10-CM | POA: Diagnosis not present

## 2018-03-14 DIAGNOSIS — I10 Essential (primary) hypertension: Secondary | ICD-10-CM

## 2018-03-14 DIAGNOSIS — F0150 Vascular dementia without behavioral disturbance: Secondary | ICD-10-CM

## 2018-03-14 NOTE — Progress Notes (Signed)
Location:  [facility] Room Number: 117-A Place of Service:  SNF (Amanda) Provider:  Kenard Gower, NP  Patient Care Team: Pecola Lawless, MD as PCP - General (Internal Medicine) Gillis Santa, NP as Nurse Practitioner (Internal Medicine)  Extended Emergency Contact Information Primary Emergency Contact: Beagley,[name] Address: [address]          [place], [place] [address] [place] of [place] Home Phone: [phone] Mobile Phone: [phone] Relation: Spouse Secondary Emergency Contact: Romeo [place] Mobile Phone: [phone] Relation: Son  Code Status:  DNR  Goals of care: Advanced Directive information Advanced Directives 03/14/2018  Does Patient Have a Medical Advance Directive? Yes  Type of Advance Directive Out of facility DNR (pink MOST or yellow form)  Does patient want to make changes to medical advance directive? No - Patient declined  Copy of Healthcare Power of Attorney in Chart? -  Would patient like information on creating a medical advance directive? -  Pre-existing out of facility DNR order (yellow form or pink MOST form) -     Chief Complaint  Patient presents with  . Medical Management of Chronic Issues    The patient is seen for a routine [facility] visit    HPI:  Pt is a 72 y.o. [name] seen today for medical management of chronic diseases.  She is a long-term care resident of [facility].  She has a PMH of CKD, CVA, hypertension, Alzheimer's disease, HLD, chronic constipation, and is nonambulatory and nonverbal at baseline. She was seen in the room today with husband at bedside waiting for staff to put resident on gerichair so he can assist her in eating.    Past Medical History:  Diagnosis Date  . Dementia    "alzheimer's" (05/18/2012)  . Fall 05/17/2012  . Fracture of medial wall of orbit (HCC) 05/17/2012  . Gout    "? feet" (05/18/2012);  uric acid 9.4 on 08/06/16 with R wrist pain; 7.1 on 08/15/16 on Allopurinol  . Hypertension   . Incontinence of urine   . Stroke [facility]) ~ 2003   "slight memory loss" (05/18/2012)  . Wrist fracture, bilateral 05/17/2012   "fell down steps" (05/18/2012)   Past Surgical History:  Procedure Laterality Date  . fractured tooth     01/06/17 Dr Ocie Doyne DMD extracted tooth  #4   . VAGINAL HYSTERECTOMY      Allergies  Allergen Reactions  . Pollen Extract Other (See Comments)    rhinitis    Outpatient Encounter Medications as of 03/14/2018  Medication Sig  . barrier cream (NON-SPECIFIED) CREA Apply 1 application topically 3 (three) times daily. Apply to inner left buttock each shift  . CALCIUM-VITAMIN D PO Take 500 mg by mouth daily.  . cloNIDine (CATAPRES) 0.1 MG tablet Take 0.1 mg by mouth 2 (two) times daily.   Marland Kitchen labetalol (NORMODYNE) 300 MG tablet Take 300 mg by mouth 2 (two) times daily.  . Multiple Vitamins-Minerals (ELDERTONIC PO) Take 15 mLs by mouth 2 (two) times daily.  . Multiple Vitamins-Minerals (MULTIVITAMIN WITH MINERALS) tablet Take 1 tablet by mouth daily.  . Nutritional Supplements (NUTRITIONAL SUPPLEMENT PO) Take 1 each by mouth 2 (two) times daily. Magic Cup  . senna-docusate (SENOKOT-S) 8.6-50 MG tablet Take 1-2 tablets by mouth See admin instructions. Take 1 tablet QAM and 2 tablets QPM  . [DISCONTINUED] sodium fluoride (PREVIDENT 5000 PLUS) 1.1 % CREA dental cream Place 1 application onto teeth every evening.  No facility-administered encounter medications on file as of 03/14/2018.     Review of Systems  Unable to obtain due to dementia    Immunization History  Administered Date(s) Administered  . Influenza-Unspecified 05/10/2013, 05/22/2015, 05/14/2016, 05/23/2017  . Pneumococcal-Unspecified 06/03/2012, 05/14/2016   Pertinent  Health Maintenance Due  Topic Date Due  . INFLUENZA VACCINE  06/10/2018 (Originally 03/10/2018)  . PNA vac Low Risk Adult (2 of 2 - PCV13)  07/21/2018 (Originally 05/14/2017)  . MAMMOGRAM  07/Amanda/2025 (Originally 11/26/2014)  . COLONOSCOPY  07/Amanda/2025 (Originally 09/25/1993)  . DEXA SCAN  Discontinued   Fall Risk  04/06/2017 09/23/2016 09/11/2016 06/10/2016 04/10/2016  Falls in the past year? Yes No No No Exclusion - non ambulatory  Number falls in past yr: 1 - - - -  Injury with Fall? Yes - - - -    Vitals:   03/14/18 0907  BP: 113/69  Pulse: 62  Resp: 16  Temp: (!) 97.5 F (36.4 C)  TempSrc: Oral  SpO2: 98%  Weight: 114 lb 3.2 oz (51.8 kg)  Height: 5\' 1"  (1.549 m)   Body mass index is 21.58 kg/m.  Physical Exam  GENERAL APPEARANCE:  In no acute distress. Normal body habitus MOUTH and THROAT: Lips are without lesions. Oral mucosa is moist and without lesions.  RESPIRATORY: Breathing is even & unlabored, BS CTAB CARDIAC: RRR, no murmur,no extra heart sounds, no edema GI: Abdomen soft, normal BS, no masses, no tenderness EXTREMITIES:  Did not move her extremities, BUE contractures NEURO:  Non-verbal PSYCHIATRIC:  Affect and behavior are appropriate  Labs reviewed: Recent Labs    08/11/17 1349  08/13/17 0431 08/14/17 0214  11/11/17 1843 11/11/17 2100 11/12/17 0957 11/13/17 0250  NA 156*   < > 150* 148*   < > 151*  --  143 143  K 3.5   < > 3.1* 3.5   < > 2.7*  --  3.3* 4.0  CL 128*   < > 123* 120*   < > 123*  --  119* 116*  CO2 20*   < > 18* 18*   < > 17*  --  18* 18*  GLUCOSE 97   < > 135* 115*   < > 121*  --  103* 84  BUN 40*   < > 19 9   < > 19  --  12 8  CREATININE 1.95*   < > 1.51* 1.33*   < > 1.38*  --  1.23* 1.15*  CALCIUM 9.2   < > 8.5* 8.8*   < > 8.0*  --  7.9* 8.0*  MG 2.5*  --  2.0 1.8  --   --  1.8  --   --   PHOS 3.5  --   --   --   --   --   --   --   --    < > = values in this interval not displayed.   Recent Labs    08/11/17 0736 08/12/17 0249 11/09/17 2205  AST 18 16 19   ALT 15 12* 14  ALKPHOS 77 71 65  BILITOT 0.6 1.0 1.0  PROT 8.3* 7.1 7.9  ALBUMIN 3.7 3.2* 2.9*   Recent Labs     08/11/17 0736  08/14/17 2210  11/09/17 2205 11/10/17 0242 11/11/17 0741 11/13/17 0840  WBC 12.3*   < > 11.0*   < > 25.4* 22.5* 15.1* 10.0  NEUTROABS 9.5*  --  8.5*  --  21.4*  --   --   --  HGB 13.6   < > 14.8   < > 13.0 12.2 11.4* 11.5*  HCT 44.3   < > 44.4   < > 41.2 39.5 37.0 36.2  MCV 98.7   < > 92.1   < > 95.6 96.1 95.6 91.6  PLT 285   < > PLATELET CLUMPS NOTED ON SMEAR, COUNT APPEARS ADEQUATE   < > 328 284 275 281   < > = values in this interval not displayed.   Lab Results  Component Value Date   TSH 1.61 09/18/2016    Lab Results  Component Value Date   CHOL 126 12/17/2016   HDL 35 12/17/2016   LDLCALC 69 12/17/2016   TRIG 109 12/17/2016    Assessment/Plan  1. History of CVA (cerebrovascular accident) - stable, continue Labetalol 300 mg BID and Clonidine 0.1 mg 1 tab BID   2. Essential hypertension, benign - well-controlled, continue Labetalol 300 mg BID and Clonidine 0.1 mg 1 tab BID   3. Slow transit constipation - continue Senexon-S 1 tab Q AM and PM   4. Vascular dementia without behavioral disturbance - continue supportive care, fall precautions   5. Dysphagia - husband assists with feeding the resident, continue Puree with HTL, aspiration precautions   Family/ staff Communication: Discussed plan of care with husband.  Labs/tests ordered:  None  Goals of care:   Long-term care.   Kenard GowerMonina [name], NP [facility] [phone] (Monday-Friday 8:00 a.m. - 5:00 p.m.) [phone] (after hours)

## 2018-04-07 ENCOUNTER — Non-Acute Institutional Stay (SKILLED_NURSING_FACILITY): Payer: Medicare Other

## 2018-04-07 DIAGNOSIS — Z0000 Encounter for general adult medical examination without abnormal findings: Secondary | ICD-10-CM | POA: Diagnosis not present

## 2018-04-07 NOTE — Progress Notes (Signed)
Subjective:   Amanda [name] is a 72 y.o. female who presents for Medicare Annual (Subsequent) preventive examination at [facility]; incapacitated patient unable to answer questions appropriately   Last AWV-04/06/2017    Objective:     Vitals: BP 120/70 (BP Location: Left Arm, Patient Position: Sitting)   Pulse 68   Temp 97.9 F (36.6 C) (Oral)   Ht 5\' 1"  (1.549 m)   Wt 114 lb (51.7 kg)   BMI 21.54 kg/m   Body mass index is 21.54 kg/m.  Advanced Directives 04/07/2018 03/14/2018 03/14/2018 02/08/2018 01/10/2018 12/13/2017 11/16/2017  Does Patient Have a Medical Advance Directive? Yes Yes Yes Yes Yes Yes Yes  Type of Advance Directive Out of facility DNR (pink MOST or yellow form) - Out of facility DNR (pink MOST or yellow form) Out of facility DNR (pink MOST or yellow form) Out of facility DNR (pink MOST or yellow form) Out of facility DNR (pink MOST or yellow form) Out of facility DNR (pink MOST or yellow form)  Does patient want to make changes to medical advance directive? No - Patient declined - No - Patient declined No - Patient declined No - Patient declined No - Patient declined No - Patient declined  Copy of Healthcare Power of Attorney in Chart? - - - - - - -  Would patient like information on creating a medical advance directive? - - - - - - -  Pre-existing out of facility DNR order (yellow form or pink MOST form) Yellow form placed in chart (order not valid for inpatient use) - - - - - Pink MOST form placed in chart (order not valid for inpatient use)    Tobacco Social History   Tobacco Use  Smoking Status Former Smoker  . Types: Cigarettes  Smokeless Tobacco Never Used  Tobacco Comment   Staff at [facility] reports that patient no longer smokes.      Counseling given: Not Answered Comment: Staff at [facility] reports that [facility] no longer smokes.    Clinical Intake:  Pre-visit preparation completed: No  Pain : Faces Faces Pain Scale: No hurt  Faces  Pain Scale: No hurt  Nutritional Risks: None Diabetes: No  How often do you need to have someone help you when you read instructions, pamphlets, or other written materials from your doctor or pharmacy?: 5 - Always  Interpreter Needed?: No  Information entered by :: Tyron RussellSara Shannel Zahm, RN  Past Medical History:  Diagnosis Date  . Dementia    "alzheimer's" (05/18/2012)  . Fall 05/17/2012  . Fracture of medial wall of orbit (HCC) 05/17/2012  . Gout    "? feet" (05/18/2012); uric acid 9.4 on 08/06/16 with R wrist pain; 7.1 on 08/15/16 on Allopurinol  . Hypertension   . Incontinence of urine   . Stroke [facility](HCC) ~ 2003   "slight memory loss" (05/18/2012)  . Wrist fracture, bilateral 05/17/2012   "fell down steps" (05/18/2012)   Past Surgical History:  Procedure Laterality Date  . fractured tooth     01/06/17 Dr Ocie DoyneScott Jensen DMD extracted tooth  #4   . VAGINAL HYSTERECTOMY     Family History  Problem Relation Age of Onset  . Dementia Mother   . Dementia Father   . Dementia Sister   . Dementia Brother    Social History   Socioeconomic History  . Marital status: Married    Spouse name: Not on file  . Number of children: Not on file  . Years of  education: Not on file  . Highest education level: Not on file  Occupational History  . Not on file  Social Needs  . Financial resource strain: Not on file  . Food insecurity:    Worry: Not on file    Inability: Not on file  . Transportation needs:    Medical: Not on file    Non-medical: Not on file  Tobacco Use  . Smoking status: Former Smoker    Types: Cigarettes  . Smokeless tobacco: Never Used  . Tobacco comment: Staff at [facility] reports that patient no longer smokes.   Substance and Sexual Activity  . Alcohol use: No    Alcohol/week: 0.0 standard drinks    Comment: Staff at [facility] reports that patient no longer drinks.   . Drug use: No    Types: Marijuana  . Sexual activity: Not Currently  Lifestyle  . Physical activity:     Days per week: Not on file    Minutes per session: Not on file  . Stress: Not on file  Relationships  . Social connections:    Talks on phone: Not on file    Gets together: Not on file    Attends religious service: Not on file    Active member of club or organization: Not on file    Attends meetings of clubs or organizations: Not on file    Relationship status: Not on file  Other Topics Concern  . Not on file  Social History Narrative   Patient's husband is very involved in patient's care.    Outpatient Encounter Medications as of 04/07/2018  Medication Sig  . barrier cream (NON-SPECIFIED) CREA Apply 1 application topically 3 (three) times daily. Apply to inner left buttock each shift  . CALCIUM-VITAMIN D PO Take 500 mg by mouth daily.  . cloNIDine (CATAPRES) 0.1 MG tablet Take 0.1 mg by mouth 2 (two) times daily.   Marland Kitchen labetalol (NORMODYNE) 300 MG tablet Take 300 mg by mouth 2 (two) times daily.  . Multiple Vitamins-Minerals (ELDERTONIC PO) Take 15 mLs by mouth 2 (two) times daily.  . Multiple Vitamins-Minerals (MULTIVITAMIN WITH MINERALS) tablet Take 1 tablet by mouth daily.  . Nutritional Supplements (NUTRITIONAL SUPPLEMENT PO) Take 1 each by mouth 2 (two) times daily. Magic Cup  . senna-docusate (SENOKOT-S) 8.6-50 MG tablet Take 1-2 tablets by mouth See admin instructions. Take 1 tablet QAM and 2 tablets QPM   No facility-administered encounter medications on file as of 04/07/2018.     Activities of Daily Living In your present state of health, do you have any difficulty performing the following activities: 04/07/2018 11/10/2017  Hearing? Y (No Data)  Comment - uta  Vision? Y (No Data)  Comment - uta  Difficulty concentrating or making decisions? Malvin Johns  Walking or climbing stairs? Y Y  Dressing or bathing? Y Y  Doing errands, shopping? Malvin Johns  Preparing Food and eating ? Y -  Using the Toilet? Y -  In the past six months, have you accidently leaked urine? Y -  Do you have problems  with loss of bowel control? Y -  Managing your Medications? Y -  Managing your Finances? Y -  Housekeeping or managing your Housekeeping? Y -  Some recent data might be hidden    Patient Care Team: Pecola Lawless, MD as PCP - General (Internal Medicine) [name], Margit Banda, NP as Nurse Practitioner (Internal Medicine)    Assessment:   This is a routine wellness examination for Amanda [name].  Exercise Activities and Dietary recommendations Current Exercise Habits: The patient does not participate in regular exercise at present, Exercise limited by: orthopedic condition(s);neurologic condition(s)  Goals   None     Fall Risk Fall Risk  04/07/2018 04/06/2017 09/23/2016 09/11/2016 06/10/2016  Falls in the past year? No Yes No No No  Number falls in past yr: - 1 - - -  Injury with Fall? - Yes - - -   Is the patient's home free of loose throw rugs in walkways, pet beds, electrical cords, etc?   yes      Grab bars in the bathroom? yes      Handrails on the stairs?   yes      Adequate lighting?   yes  Depression Screen PHQ 2/9 Scores 04/07/2018 04/06/2017 02/05/2016  Exception Documentation Medical reason Medical reason Other- indicate reason in comment box  Not completed - - Patient at nursing facility.     Cognitive Function MMSE - Mini Mental State Exam 04/07/2018 04/06/2017  Not completed: Unable to complete Unable to complete        Immunization History  Administered Date(s) Administered  . Influenza-Unspecified 05/10/2013, 05/22/2015, 05/14/2016, 05/23/2017  . Pneumococcal-Unspecified 06/03/2012, 05/14/2016    Qualifies for Shingles Vaccine? Not in past records  Screening Tests Health Maintenance  Topic Date Due  . INFLUENZA VACCINE  06/10/2018 (Originally 03/10/2018)  . PNA vac Low Risk Adult (2 of 2 - PCV13) 07/21/2018 (Originally 05/14/2017)  . TETANUS/TDAP  12-24-202025 (Originally 09/25/1962)  . MAMMOGRAM  03/09/2024 (Originally 11/26/2014)  . COLONOSCOPY  03/09/2024  (Originally 09/25/1993)  . Hepatitis C Screening  Completed  . DEXA SCAN  Discontinued    Cancer Screenings: Lung: Low Dose CT Chest recommended if Age 17-80 years, 30 pack-year currently smoking OR have quit w/in 15years. Patient does not qualify. Breast:  Up to date on Mammogram? No,excluded due to dementia and unambulatory Up to date of Bone Density/Dexa? No, excluded due to dementia and unambulatory Colorectal: excluded due to dementia and unambulatory  Additional Screenings:  Hepatitis C Screening: unable to appropriately accept or decline Flu vaccine due: will receive at [facility] TDAP due: ordered Prevnar due: ordered     Plan:    I have personally reviewed and addressed the Medicare Annual Wellness questionnaire and have noted the following in the patient's chart:  A. Medical and social history B. Use of alcohol, tobacco or illicit drugs  C. Current medications and supplements D. Functional ability and status E.  Nutritional status F.  Physical activity G. Advance directives H. List of other physicians I.  Hospitalizations, surgeries, and ER visits in previous 12 months J.  Vitals K. Screenings to include hearing, vision, cognitive, depression L. Referrals and appointments - none  In addition, I am unable to review and discuss with incapacitated patient certain preventive protocols, quality metrics, and best practice recommendations. A written personalized care plan for preventive services as well as general preventive health recommendations were provided to patient.   See attached scanned questionnaire for additional information.   Signed,   Tyron Russell, RN Nurse Health Advisor

## 2018-04-07 NOTE — Patient Instructions (Signed)
Ms. Clinton SawyerWilliamson , Thank you for taking time to come for your Medicare Wellness Visit. I appreciate your ongoing commitment to your health goals. Please review the following plan we discussed and let me know if I can assist you in the future.   Screening recommendations/referrals: Colonoscopy excluded Mammogram excluded Bone Density excluded Recommended yearly ophthalmology/optometry visit for glaucoma screening and checkup Recommended yearly dental visit for hygiene and checkup  Vaccinations: Influenza vaccine due, will receive at [facility] Pneumococcal vaccine 13 due, ordered Tdap vaccine due, ordered Shingles vaccine not in past records    Advanced directives: in chart  Conditions/risks identified: none  Next appointment: Dr. Alwyn RenHopper makes rounds   Preventive Care 65 Years and Older, Female Preventive care refers to lifestyle choices and visits with your health care provider that can promote health and wellness. What does preventive care include?  A yearly physical exam. This is also called an annual well check.  Dental exams once or twice a year.  Routine eye exams. Ask your health care provider how often you should have your eyes checked.  Personal lifestyle choices, including:  Daily care of your teeth and gums.  Regular physical activity.  Eating a healthy diet.  Avoiding tobacco and drug use.  Limiting alcohol use.  Practicing safe sex.  Taking low-dose aspirin every day.  Taking vitamin and mineral supplements as recommended by your health care provider. What happens during an annual well check? The services and screenings done by your health care provider during your annual well check will depend on your age, overall health, lifestyle risk factors, and family history of disease. Counseling  Your health care provider may ask you questions about your:  Alcohol use.  Tobacco use.  Drug use.  Emotional well-being.  Home and relationship  well-being.  Sexual activity.  Eating habits.  History of falls.  Memory and ability to understand (cognition).  Work and work Astronomerenvironment.  Reproductive health. Screening  You may have the following tests or measurements:  Height, weight, and BMI.  Blood pressure.  Lipid and cholesterol levels. These may be checked every 5 years, or more frequently if you are over 74 years old.  Skin check.  Lung cancer screening. You may have this screening every year starting at age 74 if you have a 30-pack-year history of smoking and currently smoke or have quit within the past 15 years.  Fecal occult blood test (FOBT) of the stool. You may have this test every year starting at age 74.  Flexible sigmoidoscopy or colonoscopy. You may have a sigmoidoscopy every 5 years or a colonoscopy every 10 years starting at age 74.  Hepatitis C blood test.  Hepatitis B blood test.  Sexually transmitted disease (STD) testing.  Diabetes screening. This is done by checking your blood sugar (glucose) after you have not eaten for a while (fasting). You may have this done every 1-3 years.  Bone density scan. This is done to screen for osteoporosis. You may have this done starting at age 74.  Mammogram. This may be done every 1-2 years. Talk to your health care provider about how often you should have regular mammograms. Talk with your health care provider about your test results, treatment options, and if necessary, the need for more tests. Vaccines  Your health care provider may recommend certain vaccines, such as:  Influenza vaccine. This is recommended every year.  Tetanus, diphtheria, and acellular pertussis (Tdap, Td) vaccine. You may need a Td booster every 10 years.  Zoster vaccine.  You may need this after age 41.  Pneumococcal 13-valent conjugate (PCV13) vaccine. One dose is recommended after age 28.  Pneumococcal polysaccharide (PPSV23) vaccine. One dose is recommended after age  60. Talk to your health care provider about which screenings and vaccines you need and how often you need them. This information is not intended to replace advice given to you by your health care provider. Make sure you discuss any questions you have with your health care provider. Document Released: 08/23/2015 Document Revised: 04/15/2016 Document Reviewed: 05/28/2015 Elsevier Interactive Patient Education  2017 [place] Prevention in the Home Falls can cause injuries. They can happen to people of all ages. There are many things you can do to make your home safe and to help prevent falls. What can I do on the outside of my home?  Regularly fix the edges of walkways and driveways and fix any cracks.  Remove anything that might make you trip as you walk through a door, such as a raised step or threshold.  Trim any bushes or trees on the path to your home.  Use bright outdoor lighting.  Clear any walking paths of anything that might make someone trip, such as rocks or tools.  Regularly check to see if handrails are loose or broken. Make sure that both sides of any steps have handrails.  Any raised decks and porches should have guardrails on the edges.  Have any leaves, snow, or ice cleared regularly.  Use sand or salt on walking paths during winter.  Clean up any spills in your garage right away. This includes oil or grease spills. What can I do in the bathroom?  Use night lights.  Install grab bars by the toilet and in the tub and shower. Do not use towel bars as grab bars.  Use non-skid mats or decals in the tub or shower.  If you need to sit down in the shower, use a plastic, non-slip stool.  Keep the floor dry. Clean up any water that spills on the floor as soon as it happens.  Remove soap buildup in the tub or shower regularly.  Attach bath mats securely with double-sided non-slip rug tape.  Do not have throw rugs and other things on the floor that can make  you trip. What can I do in the bedroom?  Use night lights.  Make sure that you have a light by your bed that is easy to reach.  Do not use any sheets or blankets that are too big for your bed. They should not hang down onto the floor.  Have a firm chair that has side arms. You can use this for support while you get dressed.  Do not have throw rugs and other things on the floor that can make you trip. What can I do in the kitchen?  Clean up any spills right away.  Avoid walking on wet floors.  Keep items that you use a lot in easy-to-reach places.  If you need to reach something above you, use a strong step stool that has a grab bar.  Keep electrical cords out of the way.  Do not use floor polish or wax that makes floors slippery. If you must use wax, use non-skid floor wax.  Do not have throw rugs and other things on the floor that can make you trip. What can I do with my stairs?  Do not leave any items on the stairs.  Make sure that there are handrails on  both sides of the stairs and use them. Fix handrails that are broken or loose. Make sure that handrails are as long as the stairways.  Check any carpeting to make sure that it is firmly attached to the stairs. Fix any carpet that is loose or worn.  Avoid having throw rugs at the top or bottom of the stairs. If you do have throw rugs, attach them to the floor with carpet tape.  Make sure that you have a light switch at the top of the stairs and the bottom of the stairs. If you do not have them, ask someone to add them for you. What else can I do to help prevent falls?  Wear shoes that:  Do not have high heels.  Have rubber bottoms.  Are comfortable and fit you well.  Are closed at the toe. Do not wear sandals.  If you use a stepladder:  Make sure that it is fully opened. Do not climb a closed stepladder.  Make sure that both sides of the stepladder are locked into place.  Ask someone to hold it for you, if  possible.  Clearly mark and make sure that you can see:  Any grab bars or handrails.  First and last steps.  Where the edge of each step is.  Use tools that help you move around (mobility aids) if they are needed. These include:  Canes.  Walkers.  Scooters.  Crutches.  Turn on the lights when you go into a dark area. Replace any light bulbs as soon as they burn out.  Set up your furniture so you have a clear path. Avoid moving your furniture around.  If any of your floors are uneven, fix them.  If there are any pets around you, be aware of where they are.  Review your medicines with your doctor. Some medicines can make you feel dizzy. This can increase your chance of falling. Ask your doctor what other things that you can do to help prevent falls. This information is not intended to replace advice given to you by your health care provider. Make sure you discuss any questions you have with your health care provider. Document Released: 05/23/2009 Document Revised: 01/02/2016 Document Reviewed: 08/31/2014 Elsevier Interactive Patient Education  2017 [employer].

## 2018-04-20 ENCOUNTER — Non-Acute Institutional Stay (SKILLED_NURSING_FACILITY): Payer: Medicare Other | Admitting: Adult Health

## 2018-04-20 ENCOUNTER — Encounter: Payer: Self-pay | Admitting: Adult Health

## 2018-04-20 DIAGNOSIS — F0150 Vascular dementia without behavioral disturbance: Secondary | ICD-10-CM | POA: Diagnosis not present

## 2018-04-20 DIAGNOSIS — I10 Essential (primary) hypertension: Secondary | ICD-10-CM | POA: Diagnosis not present

## 2018-04-20 DIAGNOSIS — K5901 Slow transit constipation: Secondary | ICD-10-CM

## 2018-04-20 NOTE — Progress Notes (Signed)
Location:  [facility] Room Number: 117-A Place of Service:  SNF (31) Provider:  Kenard Gower, NP  Patient Care Team: Pecola Lawless, MD as PCP - General (Internal Medicine) Gillis Santa, NP as Nurse Practitioner (Internal Medicine)  Extended Emergency Contact Information Primary Emergency Contact: AmandaClarence Address: [address]          [place], [place] [address] Amanda [name] of [place] Home Phone: [phone] Mobile Phone: [phone] Relation: Spouse Secondary Emergency Contact: Romeo Apple [place] of [place] Mobile Phone: [phone] Relation: Son  Code Status:  Full Code  Goals of care: Advanced Directive information Advanced Directives 04/20/2018  Does Patient Have a Medical Advance Directive? Yes  Type of Advance Directive -  Does patient want to make changes to medical advance directive? -  Copy of Healthcare Power of Attorney in Chart? -  Would patient like information on creating a medical advance directive? -  Pre-existing out of facility DNR order (yellow form or pink MOST form) -     Chief Complaint  Patient presents with  . Medical Management of Chronic Issues    Patient is seen for a routine [facility] visit    HPI:  Pt is a 72 y.o. [name] seen today for medical management of chronic diseases.  She is a long-term care resident of [facility].  She has a PMH of CKD, CVA, hypertension, Alzheimer's disease, HLD, chronic constipation, and is nonambulatory and nonverbal at baseline. She was seen in the room today. She is no longer followed by hospice. Husband comes to visit everyday.   Past Medical History:  Diagnosis Date  . Dementia    "alzheimer's" (05/18/2012)  . Fall 05/17/2012  . Fracture of medial wall of orbit (HCC) 05/17/2012  . Gout    "? feet" (05/18/2012); uric acid 9.4 on 08/06/16 with R wrist pain; 7.1 on 08/15/16 on Allopurinol  . Hypertension   .  Incontinence of urine   . Stroke [facility]) ~ 2003   "slight memory loss" (05/18/2012)  . Wrist fracture, bilateral 05/17/2012   "fell down steps" (05/18/2012)   Past Surgical History:  Procedure Laterality Date  . fractured tooth     01/06/17 Dr Ocie Doyne DMD extracted tooth  #4   . VAGINAL HYSTERECTOMY      Allergies  Allergen Reactions  . Pollen Extract Other (See Comments)    rhinitis    Outpatient Encounter Medications as of 04/20/2018  Medication Sig  . barrier cream (NON-SPECIFIED) CREA Apply 1 application topically 3 (three) times daily. Apply to inner left buttock each shift  . CALCIUM-VITAMIN D PO Take 500 mg by mouth daily.  . cloNIDine (CATAPRES) 0.1 MG tablet Take 0.1 mg by mouth 2 (two) times daily.   Marland Kitchen labetalol (NORMODYNE) 300 MG tablet Take 300 mg by mouth 2 (two) times daily.  . Multiple Vitamins-Minerals (ELDERTONIC PO) Take 15 mLs by mouth 2 (two) times daily.  . Multiple Vitamins-Minerals (MULTIVITAMIN WITH MINERALS) tablet Take 1 tablet by mouth daily.  . Nutritional Supplements (NUTRITIONAL SUPPLEMENT PO) Take 1 each by mouth 2 (two) times daily. Magic Cup  . senna-docusate (SENOKOT-S) 8.6-50 MG tablet Take 1-2 tablets by mouth See admin instructions. Take 1 tablet QAM and 2 tablets QPM   No facility-administered encounter medications on file as of 04/20/2018.     Review of Systems Unable to obtain due to dementia    Immunization History  Administered Date(s) Administered  . Influenza-Unspecified 05/10/2013, 05/22/2015, 05/14/2016, 05/23/2017  .  Pneumococcal-Unspecified 06/03/2012, 05/14/2016   Pertinent  Health Maintenance Due  Topic Date Due  . INFLUENZA VACCINE  06/10/2018 (Originally 03/10/2018)  . PNA vac Low Risk Adult (2 of 2 - PCV13) 07/21/2018 (Originally 05/14/2017)  . MAMMOGRAM  03/09/2024 (Originally 11/26/2014)  . COLONOSCOPY  03/09/2024 (Originally 09/25/1993)  . DEXA SCAN  Discontinued   Fall Risk  04/07/2018 04/06/2017 09/23/2016 09/11/2016  06/10/2016  Falls in the past year? No Yes No No No  Number falls in past yr: - 1 - - -  Injury with Fall? - Yes - - -   Vitals:   04/20/18 1413  BP: 115/71  Pulse: 73  Resp: 20  Temp: 98.4 F (36.9 C)  TempSrc: Oral  SpO2: 95%  Weight: 115 lb 12.8 oz (52.5 kg)  Height: 5\' 1"  (1.549 m)   Body mass index is 21.88 kg/m.  Physical Exam  GENERAL APPEARANCE: Well nourished. In no acute distress. Normal body habitus MOUTH and THROAT: Lips are without lesions.  RESPIRATORY: Breathing is even & unlabored, BS CTAB CARDIAC: RRR, no murmur,no extra heart sounds, no edema GI: Abdomen soft, normal BS, no masses, no tenderness EXTREMITIES:  Does not move extremities, BUE contracted NEURO:  Aphasic PSYCHIATRIC:  Affect and behavior are appropriate   Labs reviewed: Recent Labs    08/11/17 1349  08/13/17 0431 08/14/17 0214  11/11/17 1843 11/11/17 2100 11/12/17 0957 11/13/17 0250  NA 156*   < > 150* 148*   < > 151*  --  143 143  K 3.5   < > 3.1* 3.5   < > 2.7*  --  3.3* 4.0  CL 128*   < > 123* 120*   < > 123*  --  119* 116*  CO2 20*   < > 18* 18*   < > 17*  --  18* 18*  GLUCOSE 97   < > 135* 115*   < > 121*  --  103* Amanda  BUN 40*   < > 19 9   < > 19  --  12 8  CREATININE 1.95*   < > 1.51* 1.33*   < > 1.38*  --  1.23* 1.15*  CALCIUM 9.2   < > 8.5* 8.8*   < > 8.0*  --  7.9* 8.0*  MG 2.5*  --  2.0 1.8  --   --  1.8  --   --   PHOS 3.5  --   --   --   --   --   --   --   --    < > = values in this interval not displayed.   Recent Labs    08/11/17 0736 08/12/17 0249 11/09/17 2205  AST 18 16 19   ALT 15 12* 14  ALKPHOS 77 71 65  BILITOT 0.6 1.0 1.0  PROT 8.3* 7.1 7.9  ALBUMIN 3.7 3.2* 2.9*   Recent Labs    08/11/17 0736  08/14/17 2210  11/09/17 2205 11/10/17 0242 11/11/17 0741 11/13/17 0840  WBC 12.3*   < > 11.0*   < > 25.4* 22.5* 15.1* 10.0  NEUTROABS 9.5*  --  8.5*  --  21.4*  --   --   --   HGB 13.6   < > 14.8   < > 13.0 12.2 11.4* 11.5*  HCT 44.3   < > 44.4   < >  41.2 39.5 37.0 36.2  MCV 98.7   < > 92.1   < > 95.6 96.1 95.6  91.6  PLT 285   < > PLATELET CLUMPS NOTED ON SMEAR, COUNT APPEARS ADEQUATE   < > 328 284 275 281   < > = values in this interval not displayed.   Lab Results  Component Value Date   TSH 1.61 09/18/2016    Lab Results  Component Value Date   CHOL 126 12/17/2016   HDL 35 12/17/2016   LDLCALC 69 12/17/2016   TRIG 109 12/17/2016    Assessment/Plan  1. Essential hypertension, benign - well-controlled, continue Labetalol 300 mg 1 tab BID and Clonidine 0.1 mg BID   2. Slow transit constipation - continue Senexon-S 1 tab Q AM and 2 tabs Q evening   3. Vascular dementia without behavioral disturbance - continue supportive     Family/ staff Communication:  Discussed plan of care with charge nurse.  Labs/tests ordered:  None  Goals of care:   Long-term care.   Kenard Gower, NP [facility] and [facility] [phone] (Monday-Friday 8:00 a.m. - 5:00 p.m.) [phone] (after hours)

## 2018-04-22 DIAGNOSIS — I739 Peripheral vascular disease, unspecified: Secondary | ICD-10-CM | POA: Diagnosis not present

## 2018-04-22 DIAGNOSIS — B351 Tinea unguium: Secondary | ICD-10-CM | POA: Diagnosis not present

## 2018-05-12 ENCOUNTER — Non-Acute Institutional Stay (SKILLED_NURSING_FACILITY): Payer: Medicare Other | Admitting: Internal Medicine

## 2018-05-12 ENCOUNTER — Encounter: Payer: Self-pay | Admitting: Internal Medicine

## 2018-05-12 DIAGNOSIS — I10 Essential (primary) hypertension: Secondary | ICD-10-CM

## 2018-05-12 DIAGNOSIS — Z789 Other specified health status: Secondary | ICD-10-CM | POA: Diagnosis not present

## 2018-05-12 DIAGNOSIS — Z8673 Personal history of transient ischemic attack (TIA), and cerebral infarction without residual deficits: Secondary | ICD-10-CM | POA: Diagnosis not present

## 2018-05-12 NOTE — Assessment & Plan Note (Signed)
Focus will be on preventing significant blood pressure elevation

## 2018-05-12 NOTE — Patient Instructions (Signed)
See assessment and plan under each diagnosis in the problem list and acutely for this visit 

## 2018-05-12 NOTE — Assessment & Plan Note (Signed)
BP controlled; no change in antihypertensive medications  

## 2018-05-12 NOTE — Progress Notes (Signed)
    NURSING HOME LOCATION:  [facility] ROOM NUMBER:  117-A  CODE STATUS:  Full Code as per husband  PCP:  Pecola Lawless, MD  [address] [place] [address]  This is a nursing facility follow up of chronic medical diagnoses.    Interim medical record and care since last [facility] visit was updated with review of diagnostic studies and change in clinical status since last visit were documented.  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  Hospice had been involved in her care, but her husband rescinded this.  She was DO NOT RESUSCITATE, but he also rescinded this as well. Labs are not current but have demonstrated mild renal insufficiency as well as minimal normochromic, normocytic anemia. She remains on dual antihypertensive therapy. Family history is interesting in that both parents and brother and sister had dementia.  The etiology of the dementia is not delineated. The patient is a remote smoker.  Review of systems could not be completed as the patient is nonverbal.  Physical exam:  Pertinent or positive findings: As noted the patient is nonverbal with no interaction whatsoever.  She slept through the exam.  When the eyelids were lifted there is exotropia of the right eye.  She has thin hair over the crown with pattern alopecia.  Teeth are plaque coated.  The right nasolabial fold is decreased.  Heart rate is slow.  Breath sounds are decreased.  Pedal pulses are decreased.  The upper extremities exhibit rigidity and increased tone and are flexed across the chest.  The lower extremities are limp.  The right toe is upgoing with stimulation of the extremity.  General appearance: Adequately nourished; no acute distress, increased work of breathing is present.   Lymphatic: No lymphadenopathy about the head, neck, axilla. Eyes: No conjunctival inflammation or lid edema is present. There is  no scleral icterus. Ears:  External ear exam shows no significant lesions or deformities.   Nose:  External nasal examination shows no deformity or inflammation.  Oral exam:  Lips and gums are healthy appearing.  Neck:  No thyromegaly, masses, tenderness noted.    Heart:  No gallop, murmur, click, rub .  Lungs:  without wheezes, rhonchi, rales, rubs. Abdomen: Bowel sounds are normal. Abdomen is soft and nontender with no organomegaly, hernias, masses. GU: Deferred  Extremities:  No cyanosis, clubbing, edema  Skin: Warm & dry w/o tenting. No significant lesions or rash.  See summary under each active problem in the Problem List with associated updated therapeutic plan

## 2018-06-10 ENCOUNTER — Non-Acute Institutional Stay (SKILLED_NURSING_FACILITY): Payer: Medicare Other | Admitting: Adult Health

## 2018-06-10 ENCOUNTER — Encounter: Payer: Self-pay | Admitting: Adult Health

## 2018-06-10 DIAGNOSIS — I10 Essential (primary) hypertension: Secondary | ICD-10-CM | POA: Diagnosis not present

## 2018-06-10 DIAGNOSIS — L89152 Pressure ulcer of sacral region, stage 2: Secondary | ICD-10-CM

## 2018-06-10 DIAGNOSIS — F01518 Vascular dementia, unspecified severity, with other behavioral disturbance: Secondary | ICD-10-CM

## 2018-06-10 DIAGNOSIS — K5901 Slow transit constipation: Secondary | ICD-10-CM | POA: Diagnosis not present

## 2018-06-10 DIAGNOSIS — R1310 Dysphagia, unspecified: Secondary | ICD-10-CM

## 2018-06-10 DIAGNOSIS — F0151 Vascular dementia with behavioral disturbance: Secondary | ICD-10-CM

## 2018-06-10 DIAGNOSIS — R630 Anorexia: Secondary | ICD-10-CM | POA: Diagnosis not present

## 2018-06-10 NOTE — Progress Notes (Signed)
Location:  [facility] Room Number: 117-A Place of Service:  SNF (31) Provider:  Kenard Gower, NP  Patient Care Team: Pecola Lawless, MD as PCP - General (Internal Medicine) Gillis Santa, NP as Nurse Practitioner (Internal Medicine)  Extended Emergency Contact Information Primary Emergency Contact: Schrieber,[name] Address: [address]          [place], [place] [address] [place] of [place] Home Phone: [phone] Mobile Phone: [phone] Relation: Spouse Secondary Emergency Contact: Romeo Apple [place] of [place] Mobile Phone: [phone] Relation: Son  Code Status:  Full Code  Goals of care: Advanced Directive information Advanced Directives 04/20/2018  Does Patient Have a Medical Advance Directive? Yes  Type of Advance Directive -  Does patient want to make changes to medical advance directive? -  Copy of Healthcare Power of Attorney in Chart? -  Would patient like information on creating a medical advance directive? -  Pre-existing out of facility DNR order (yellow form or pink MOST form) -     Chief Complaint  Patient presents with  . Medical Management of Chronic Issues    Routine [facility] visit    HPI:  Pt is a 72 y.o. female seen today for medical management of chronic diseases.  She is a long-term care resident of [facility].  She has a PMH of CKD, CVA, HTN, Alzheimer's disease, HLD, chronic constipation, and at baseline is nonverbal and nonambulatory. She was seen in the room today with husband feeding her. She was noted chewing her pureed food. She is awake but nonverbal.    Past Medical History:  Diagnosis Date  . Dementia (HCC)    "alzheimer's" (05/18/2012)  . Fall 05/17/2012  . Fracture of medial wall of orbit 05/17/2012  . Gout    "? feet" (05/18/2012); uric acid 9.4 on 08/06/16 with R wrist pain; 7.1 on 08/15/16 on Allopurinol  . Hypertension   .  Incontinence of urine   . Stroke [facility]) ~ 2003   "slight memory loss" (05/18/2012)  . Wrist fracture, bilateral 05/17/2012   "fell down steps" (05/18/2012)   Past Surgical History:  Procedure Laterality Date  . fractured tooth     01/06/17 Dr Ocie Doyne DMD extracted tooth  #4   . VAGINAL HYSTERECTOMY      Allergies  Allergen Reactions  . Pollen Extract Other (See Comments)    rhinitis    Outpatient Encounter Medications as of 06/10/2018  Medication Sig  . barrier cream (NON-SPECIFIED) CREA Apply 1 application topically 3 (three) times daily. Apply to inner left buttock each shift  . CALCIUM-VITAMIN D PO Take 500 mg by mouth daily.   . cloNIDine (CATAPRES) 0.1 MG tablet Take 0.1 mg by mouth 2 (two) times daily.   Marland Kitchen labetalol (NORMODYNE) 300 MG tablet Take 300 mg by mouth 2 (two) times daily.  . Multiple Vitamins-Minerals (ELDERTONIC PO) Take 15 mLs by mouth 2 (two) times daily.  . Multiple Vitamins-Minerals (MULTIVITAMIN WITH MINERALS) tablet Take 1 tablet by mouth daily.  . Nutritional Supplements (NUTRITIONAL SUPPLEMENT PO) Take 1 each by mouth 2 (two) times daily. Magic Cup  . senna-docusate (SENOKOT-S) 8.6-50 MG tablet Take 1-2 tablets by mouth See admin instructions. Take 1 tablet QAM and 2 tablets QPM   No facility-administered encounter medications on file as of 06/10/2018.     Review of Systems Unable to obtain due to being nonverbal     Immunization History  Administered Date(s) Administered  . Influenza-Unspecified 05/10/2013,  05/22/2015, 05/14/2016, 05/23/2017  . Pneumococcal-Unspecified 06/03/2012, 05/14/2016   Pertinent  Health Maintenance Due  Topic Date Due  . INFLUENZA VACCINE  06/10/2018 (Originally 03/10/2018)  . PNA vac Low Risk Adult (2 of 2 - PCV13) 07/21/2018 (Originally 05/14/2017)  . MAMMOGRAM  03/09/2024 (Originally 11/26/2014)  . COLONOSCOPY  03/09/2024 (Originally 09/25/1993)  . DEXA SCAN  Discontinued   Fall Risk  04/07/2018 04/06/2017 09/23/2016  09/11/2016 06/10/2016  Falls in the past year? No Yes No No No  Number falls in past yr: - 1 - - -  Injury with Fall? - Yes - - -     Vitals:   06/10/18 0931  Weight: 114 lb 3.2 oz (51.8 kg)  Height: 5\' 1"  (1.549 m)   Body mass index is 21.58 kg/m.  Physical Exam  GENERAL APPEARANCE: Well nourished. In no acute distress. Normal body habitus SKIN:  Sacral wound measuring 1.7 X 1.5 X 0.1 cm, pale red.  MOUTH and THROAT: Lips are without lesions. Oral mucosa is moist and without lesions.  RESPIRATORY: Breathing is even & unlabored, BS CTAB CARDIAC: RRR, no murmur,no extra heart sounds, no edema GI: Abdomen soft, normal BS, no masses, no tenderness EXTREMITIES:  No edema nor cyanosis NEUROLOGICAL: Nonverbal PSYCHIATRIC: Affect and behavior are appropriate    Labs reviewed: Recent Labs    08/11/17 1349  08/13/17 0431 08/14/17 0214  11/11/17 1843 11/11/17 2100 11/12/17 0957 11/13/17 0250  NA 156*   < > 150* 148*   < > 151*  --  143 143  K 3.5   < > 3.1* 3.5   < > 2.7*  --  3.3* 4.0  CL 128*   < > 123* 120*   < > 123*  --  119* 116*  CO2 20*   < > 18* 18*   < > 17*  --  18* 18*  GLUCOSE 97   < > 135* 115*   < > 121*  --  103* 84  BUN 40*   < > 19 9   < > 19  --  12 8  CREATININE 1.95*   < > 1.51* 1.33*   < > 1.38*  --  1.23* 1.15*  CALCIUM 9.2   < > 8.5* 8.8*   < > 8.0*  --  7.9* 8.0*  MG 2.5*  --  2.0 1.8  --   --  1.8  --   --   PHOS 3.5  --   --   --   --   --   --   --   --    < > = values in this interval not displayed.   Recent Labs    08/11/17 0736 08/12/17 0249 11/09/17 2205  AST 18 16 19   ALT 15 12* 14  ALKPHOS 77 71 65  BILITOT 0.6 1.0 1.0  PROT 8.3* 7.1 7.9  ALBUMIN 3.7 3.2* 2.9*   Recent Labs    08/11/17 0736  08/14/17 2210  11/09/17 2205 11/10/17 0242 11/11/17 0741 11/13/17 0840  WBC 12.3*   < > 11.0*   < > 25.4* 22.5* 15.1* 10.0  NEUTROABS 9.5*  --  8.5*  --  21.4*  --   --   --   HGB 13.6   < > 14.8   < > 13.0 12.2 11.4* 11.5*  HCT 44.3    < > 44.4   < > 41.2 39.5 37.0 36.2  MCV 98.7   < > 92.1   < >  95.6 96.1 95.6 91.6  PLT 285   < > PLATELET CLUMPS NOTED ON SMEAR, COUNT APPEARS ADEQUATE   < > 328 284 275 281   < > = values in this interval not displayed.   Lab Results  Component Value Date   TSH 1.61 09/18/2016    Lab Results  Component Value Date   CHOL 126 12/17/2016   HDL 35 12/17/2016   LDLCALC 69 12/17/2016   TRIG 109 12/17/2016    Assessment/Plan  1. Poor appetite -  Body mass index is 21.58 kg/m.  Continue Eldertonic 15 mL twice daily  Last Weight  Most recent update: 06/10/2018  9:31 AM   Weight  51.8 kg (114 lb 3.2 oz)            2. Essential hypertension, benign -well-controlled, continue labetalol 300 mg 1 tab twice a day and clonidine 0.1 mg 1 tab twice a day   3. Slow transit constipation -stable, continue Senexon S 1 tab every morning and 2 tabs q. evening   4. Vascular dementia with behavior disturbance (HCC) -advanced, continue supportive care, fall precautions  5.  Decubitus ulcer of sacral region, stage II (HCC) -continue current treatment, air mattress, turn to sides  6.  Dysphagia -continue nectar thickened liquids, aspiration precautions    Family/ staff Communication: Discussed plan of care with resident.  Labs/tests ordered:  None  Goals of care:   Long-term care.   Kenard Gower, NP [facility] [phone] (Monday-Friday 8:00 a.m. - 5:00 p.m.) [phone] (after hours)

## 2018-07-11 ENCOUNTER — Encounter: Payer: Self-pay | Admitting: Adult Health

## 2018-07-11 ENCOUNTER — Encounter: Payer: Self-pay | Admitting: Internal Medicine

## 2018-07-11 ENCOUNTER — Non-Acute Institutional Stay (SKILLED_NURSING_FACILITY): Payer: Medicare Other | Admitting: Adult Health

## 2018-07-11 DIAGNOSIS — R1310 Dysphagia, unspecified: Secondary | ICD-10-CM

## 2018-07-11 DIAGNOSIS — K5909 Other constipation: Secondary | ICD-10-CM | POA: Diagnosis not present

## 2018-07-11 DIAGNOSIS — I10 Essential (primary) hypertension: Secondary | ICD-10-CM

## 2018-07-11 DIAGNOSIS — R630 Anorexia: Secondary | ICD-10-CM | POA: Diagnosis not present

## 2018-07-11 NOTE — Progress Notes (Signed)
This encounter was created in error - please disregard.

## 2018-07-11 NOTE — Progress Notes (Signed)
Location:  [facility] Room Number: 117-A Place of Service:  SNF (31) Provider:  Kenard GowerMedina-Vargas, Monina, NP  Patient Care Team: Pecola LawlessHopper, William F, MD as PCP - General (Internal Medicine) Gillis SantaMedina-Vargas, Monina C, NP as Nurse Practitioner (Internal Medicine)  Extended Emergency Contact Information Primary Emergency Contact: Melaragno,[name] Address: [address]          [place], [place] [id number] [place] Home Phone: [phone] Mobile Phone: [phone] Relation: Spouse Secondary Emergency Contact: Romeo AppleWilliamson,[name] J  [place] Mobile Phone: [phone] Relation: Son  Code Status:  Full Code  Goals of care: Advanced Directive information Advanced Directives 04/20/2018  Does Patient Have a Medical Advance Directive? Yes  Type of Advance Directive -  Does patient want to make changes to medical advance directive? -  Copy of Healthcare Power of Attorney in Chart? -  Would patient like information on creating a medical advance directive? -  Pre-existing out of facility DNR order (yellow form or pink MOST form) -     Chief Complaint  Patient presents with  . Medical Management of Chronic Issues    Routine [facility] visit    HPI:  Pt is a 72 y.o. female seen today for medical management of chronic diseases.  She is a long-[facility] care resident [facility].  She has a PMH of Alzheimer's disease, CKD, CVA, HTN, HLD, chronic constipation, and at baseline is nonambulatory and nonverbal. He was seen in the room today with CNA and charge husband at bedside. She just ate breakfast with the help of the husband. Wound nurse reported that left inner buttock only has maceration from the moisture. She had gained 3.8 lbs in a month with Body mass index is 22.3 kg/m.    Past Medical History:  Diagnosis Date  . Dementia (HCC)    "alzheimer's" (05/18/2012)  . Fall 05/17/2012  . Fracture of medial wall of  orbit 05/17/2012  . Gout    "? feet" (05/18/2012); uric acid 9.4 on 08/06/16 with R wrist pain; 7.1 on 08/15/16 on Allopurinol  . Hypertension   . Incontinence of urine   . Stroke [facility](HCC) ~ 2003   "slight memory loss" (05/18/2012)  . Wrist fracture, bilateral 05/17/2012   "fell down steps" (05/18/2012)   Past Surgical History:  Procedure Laterality Date  . fractured tooth     01/06/17 Dr Ocie DoyneScott Jensen DMD extracted tooth  #4   . VAGINAL HYSTERECTOMY      Allergies  Allergen Reactions  . Pollen Extract Other (See Comments)    rhinitis    Outpatient Encounter Medications as of 07/11/2018  Medication Sig  . barrier cream (NON-SPECIFIED) CREA Apply 1 application topically 3 (three) times daily. Apply to inner left buttock each shift  . CALCIUM-VITAMIN D PO Take 500 mg by mouth daily.   . cloNIDine (CATAPRES) 0.1 MG tablet Take 0.1 mg by mouth 2 (two) times daily.   Marland Kitchen. labetalol (NORMODYNE) 300 MG tablet Take 300 mg by mouth 2 (two) times daily.  . Multiple Vitamins-Minerals (ELDERTONIC PO) Take 15 mLs by mouth 2 (two) times daily.  . Multiple Vitamins-Minerals (MULTIVITAMIN WITH MINERALS) tablet Take 1 tablet by mouth daily.  . Nutritional Supplements (NUTRITIONAL SUPPLEMENT PO) Take 1 each by mouth 2 (two) times daily. Magic Cup  . senna-docusate (SENOKOT-S) 8.6-50 MG tablet Take 1-2 tablets by mouth See admin instructions. Take 1 tablet QAM and 2 tablets QPM   No facility-administered encounter medications on file as of  07/11/2018.     Review of Systems  Unable to obtain due to dementia    Immunization History  Administered Date(s) Administered  . Influenza-Unspecified 05/10/2013, 05/14/2016, 05/23/2017, 06/10/2018  . Pneumococcal-Unspecified 06/03/2012, 05/14/2016   Pertinent  Health Maintenance Due  Topic Date Due  . PNA vac Low Risk Adult (2 of 2 - PCV13) 07/21/2018 (Originally 05/14/2017)  . MAMMOGRAM  03/09/2024 (Originally 11/26/2014)  . COLONOSCOPY  03/09/2024 (Originally  09/25/1993)  . INFLUENZA VACCINE  Completed  . DEXA SCAN  Discontinued   Fall Risk  04/07/2018 04/06/2017 09/23/2016 09/11/2016 06/10/2016  Falls in the past year? No Yes No No No  Number falls in past yr: - 1 - - -  Injury with Fall? - Yes - - -      Vitals:   07/11/18 0851  BP: 138/74  Pulse: 84  Resp: 18  Temp: 98.9 F (37.2 C)  TempSrc: Oral  Weight: 118 lb (53.5 kg)  Height: 5\' 1"  (1.549 m)   Body mass index is 22.3 kg/m.  Physical Exam  GENERAL APPEARANCE:  In no acute distress.  SKIN:  Left inner buttock pinkish, macerated MOUTH and THROAT: Lips are without lesions. Oral mucosa is moist and without lesions.  RESPIRATORY: Breathing is even & unlabored, BS CTAB CARDIAC: RRR, no murmur,no extra heart sounds, no edema GI: Abdomen soft, normal BS, no masses, no tenderness EXTREMITIES:  No edema nor cyanosis NEUROLOGICAL: There is no tremor. Non-verbal PSYCHIATRIC:  Affect and behavior are appropriate   Labs reviewed: Recent Labs    08/11/17 1349  08/13/17 0431 08/14/17 0214  11/11/17 1843 11/11/17 2100 11/12/17 0957 11/13/17 0250  NA 156*   < > 150* 148*   < > 151*  --  143 143  K 3.5   < > 3.1* 3.5   < > 2.7*  --  3.3* 4.0  CL 128*   < > 123* 120*   < > 123*  --  119* 116*  CO2 20*   < > 18* 18*   < > 17*  --  18* 18*  GLUCOSE 97   < > 135* 115*   < > 121*  --  103* 84  BUN 40*   < > 19 9   < > 19  --  12 8  CREATININE 1.95*   < > 1.51* 1.33*   < > 1.38*  --  1.23* 1.15*  CALCIUM 9.2   < > 8.5* 8.8*   < > 8.0*  --  7.9* 8.0*  MG 2.5*  --  2.0 1.8  --   --  1.8  --   --   PHOS 3.5  --   --   --   --   --   --   --   --    < > = values in this interval not displayed.   Recent Labs    08/11/17 0736 08/12/17 0249 11/09/17 2205  AST 18 16 19   ALT 15 12* 14  ALKPHOS 77 71 65  BILITOT 0.6 1.0 1.0  PROT 8.3* 7.1 7.9  ALBUMIN 3.7 3.2* 2.9*   Recent Labs    08/11/17 0736  08/14/17 2210  11/09/17 2205 11/10/17 0242 11/11/17 0741 11/13/17 0840  WBC  12.3*   < > 11.0*   < > 25.4* 22.5* 15.1* 10.0  NEUTROABS 9.5*  --  8.5*  --  21.4*  --   --   --   HGB 13.6   < >  14.8   < > 13.0 12.2 11.4* 11.5*  HCT 44.3   < > 44.4   < > 41.2 39.5 37.0 36.2  MCV 98.7   < > 92.1   < > 95.6 96.1 95.6 91.6  PLT 285   < > PLATELET CLUMPS NOTED ON SMEAR, COUNT APPEARS ADEQUATE   < > 328 284 275 281   < > = values in this interval not displayed.   Lab Results  Component Value Date   TSH 1.61 09/18/2016    Lab Results  Component Value Date   CHOL 126 12/17/2016   HDL 35 12/17/2016   LDLCALC 69 12/17/2016   TRIG 109 12/17/2016     Assessment/Plan  1. Dysphagia, unspecified type -no recent pneumonia continue pured diet with honey thick liquids, aspiration precautions   2. Chronic constipation -continue Senexon-S 1 tab every morning and 2 tabs q. evening   3. Essential hypertension, benign - labetalol 300 mg 1 tab twice a day and clonidine 0.1 mg 1 tab twice daily    4. Poor appetite -gained 3.8 lbs in a month, will continue Eldertonic 15 mL twice daily, aspiration precautions with feedings, Magic cup twice daily    Family/ staff Communication: Discussed plan of care with husband.  Labs/tests ordered:  None  Goals of care:   Long-term care.   Kenard Gower, NP [facility] [phone] (Monday-Friday 8:00 a.m. - 5:00 p.m.) [phone] (after hours)

## 2018-07-18 DIAGNOSIS — L988 Other specified disorders of the skin and subcutaneous tissue: Secondary | ICD-10-CM | POA: Diagnosis not present

## 2018-07-20 ENCOUNTER — Non-Acute Institutional Stay (SKILLED_NURSING_FACILITY): Payer: Medicare Other | Admitting: Adult Health

## 2018-07-20 ENCOUNTER — Encounter: Payer: Self-pay | Admitting: Adult Health

## 2018-07-20 DIAGNOSIS — F0280 Dementia in other diseases classified elsewhere without behavioral disturbance: Secondary | ICD-10-CM

## 2018-07-20 DIAGNOSIS — G309 Alzheimer's disease, unspecified: Secondary | ICD-10-CM | POA: Diagnosis not present

## 2018-07-20 DIAGNOSIS — Z7189 Other specified counseling: Secondary | ICD-10-CM

## 2018-07-20 DIAGNOSIS — I10 Essential (primary) hypertension: Secondary | ICD-10-CM

## 2018-07-20 DIAGNOSIS — Z710 Person encountering health services to consult on behalf of another person: Secondary | ICD-10-CM

## 2018-07-20 NOTE — Progress Notes (Signed)
Location:  [facility] Room Number: 117-A Place of Service:  SNF (31) Provider:  Kenard Gower, NP   Patient Care Team: Pecola Lawless, MD as PCP - General (Internal Medicine) Gillis Santa, NP as Nurse Practitioner (Internal Medicine)  Extended Emergency Contact Information Primary Emergency Contact: Caccavale,[name] Address: [address]          [place], [place] [address] [place] Home Phone: [phone] Mobile Phone: [phone] Relation: Spouse Secondary Emergency Contact: Romeo [place] Mobile Phone: [phone] Relation: Son  Code Status:  Full Code  Goals of care: Advanced Directive information Advanced Directives 04/20/2018  Does Patient Have a Medical Advance Directive? Yes  Type of Advance Directive -  Does patient want to make changes to medical advance directive? -  Copy of Healthcare Power of Attorney in Chart? -  Would patient like information on creating a medical advance directive? -  Pre-existing out of facility DNR order (yellow form or pink MOST form) -     Chief Complaint  Patient presents with  . Advanced Directive    Care Plan Meeting    HPI:  Pt is a 72 y.o. female seen today for a care plan meeting.  She is a long-term care resident of [facility].  She has a PMH of Alzheimer's disease, CKD, CVA, hypertension, HLD, chronic constipation, and at baseline is nonverbal and nonambulatory. The care plan meeting was attended by husband, NP, ADON, Conservator, museum/gallery and Child psychotherapist. She remains to be full code. Latest weight is 115. 2 lbs Body mass index is 21.77 kg/m. -stable. BPs are well-controlled. Left inner buttock maceration is healing. She has air mattress over bed, protective barrier cream and zinc oxide for treatment. She has Roho cushion over [employer]. Updated IDT with current medications. Husband comes to visit everyday and  participates with her care. The meeting lasted for 19 minutes.   Past Medical History:  Diagnosis Date  . Dementia (HCC)    "alzheimer's" (05/18/2012)  . Fall 05/17/2012  . Fracture of medial wall of orbit 05/17/2012  . Gout    "? feet" (05/18/2012); uric acid 9.4 on 08/06/16 with R wrist pain; 7.1 on 08/15/16 on Allopurinol  . Hypertension   . Incontinence of urine   . Stroke [facility]) ~ 2003   "slight memory loss" (05/18/2012)  . Wrist fracture, bilateral 05/17/2012   "fell down steps" (05/18/2012)   Past Surgical History:  Procedure Laterality Date  . fractured tooth     01/06/17 Dr Ocie Doyne DMD extracted tooth  #4   . VAGINAL HYSTERECTOMY      Allergies  Allergen Reactions  . Pollen Extract Other (See Comments)    rhinitis    Outpatient Encounter Medications as of 07/20/2018  Medication Sig  . barrier cream (NON-SPECIFIED) CREA Apply 1 application topically 3 (three) times daily. Apply to inner left buttock each shift  . CALCIUM-VITAMIN D PO Take 500 mg by mouth daily.   . cloNIDine (CATAPRES) 0.1 MG tablet Take 0.1 mg by mouth 2 (two) times daily.   Marland Kitchen labetalol (NORMODYNE) 300 MG tablet Take 300 mg by mouth 2 (two) times daily.  . Multiple Vitamins-Minerals (ELDERTONIC PO) Take 15 mLs by mouth 2 (two) times daily.  . Multiple Vitamins-Minerals (MULTIVITAMIN WITH MINERALS) tablet Take 1 tablet by mouth daily.  . Nutritional Supplements (NUTRITIONAL SUPPLEMENT PO) Take 1 each by mouth 2 (two) times daily. Magic Cup  . senna-docusate (SENOKOT-S) 8.6-50 MG  tablet Take 1-2 tablets by mouth See admin instructions. Take 1 tablet QAM and 2 tablets QPM   No facility-administered encounter medications on file as of 07/20/2018.     Review of Systems  Unable to obtain due to dementia    Immunization History  Administered Date(s) Administered  . Influenza-Unspecified 05/10/2013, 05/14/2016, 05/23/2017, 06/10/2018  . Pneumococcal-Unspecified 06/03/2012, 05/14/2016   Pertinent   Health Maintenance Due  Topic Date Due  . PNA vac Low Risk Adult (2 of 2 - PCV13) 07/21/2018 (Originally 05/14/2017)  . MAMMOGRAM  03/09/2024 (Originally 11/26/2014)  . COLONOSCOPY  03/09/2024 (Originally 09/25/1993)  . INFLUENZA VACCINE  Completed  . DEXA SCAN  Discontinued   Fall Risk  04/07/2018 04/06/2017 09/23/2016 09/11/2016 06/10/2016  Falls in the past year? No Yes No No No  Number falls in past yr: - 1 - - -  Injury with Fall? - Yes - - -      Vitals:   07/20/18 0902  BP: 132/74  Pulse: 68  Resp: 17  Temp: 97.6 F (36.4 C)  TempSrc: Oral  SpO2: 97%  Weight: 115 lb 3.2 oz (52.3 kg)  Height: 5\' 1"  (1.549 m)   Body mass index is 21.77 kg/m.  Physical Exam  GENERAL APPEARANCE:  In no acute distress.  SKIN:  Left inner buttock maceration healing MOUTH and THROAT: Lips are without lesions. Oral mucosa is moist and without lesions.  RESPIRATORY: Breathing is even & unlabored, BS CTAB CARDIAC: RRR, no murmur,no extra heart sounds, no edema GI: Abdomen soft, normal BS, no masses, no tenderness NEUROLOGICAL: There is no tremor. Non-verbal PSYCHIATRIC:  Affect and behavior are appropriate   Labs reviewed: Recent Labs    08/11/17 1349  08/13/17 0431 08/14/17 0214  11/11/17 1843 11/11/17 2100 11/12/17 0957 11/13/17 0250  NA 156*   < > 150* 148*   < > 151*  --  143 143  K 3.5   < > 3.1* 3.5   < > 2.7*  --  3.3* 4.0  CL 128*   < > 123* 120*   < > 123*  --  119* 116*  CO2 20*   < > 18* 18*   < > 17*  --  18* 18*  GLUCOSE 97   < > 135* 115*   < > 121*  --  103* 84  BUN 40*   < > 19 9   < > 19  --  12 8  CREATININE 1.95*   < > 1.51* 1.33*   < > 1.38*  --  1.23* 1.15*  CALCIUM 9.2   < > 8.5* 8.8*   < > 8.0*  --  7.9* 8.0*  MG 2.5*  --  2.0 1.8  --   --  1.8  --   --   PHOS 3.5  --   --   --   --   --   --   --   --    < > = values in this interval not displayed.   Recent Labs    08/11/17 0736 08/12/17 0249 11/09/17 2205  AST 18 16 19   ALT 15 12* 14  ALKPHOS 77 71  65  BILITOT 0.6 1.0 1.0  PROT 8.3* 7.1 7.9  ALBUMIN 3.7 3.2* 2.9*   Recent Labs    08/11/17 0736  08/14/17 2210  11/09/17 2205 11/10/17 0242 11/11/17 0741 11/13/17 0840  WBC 12.3*   < > 11.0*   < > 25.4* 22.5* 15.1* 10.0  NEUTROABS 9.5*  --  8.5*  --  21.4*  --   --   --   HGB 13.6   < > 14.8   < > 13.0 12.2 11.4* 11.5*  HCT 44.3   < > 44.4   < > 41.2 39.5 37.0 36.2  MCV 98.7   < > 92.1   < > 95.6 96.1 95.6 91.6  PLT 285   < > PLATELET CLUMPS NOTED ON SMEAR, COUNT APPEARS ADEQUATE   < > 328 284 275 281   < > = values in this interval not displayed.   Lab Results  Component Value Date   TSH 1.61 09/18/2016   No results found for: HGBA1C Lab Results  Component Value Date   CHOL 126 12/17/2016   HDL 35 12/17/2016   LDLCALC 69 12/17/2016   TRIG 109 12/17/2016     Assessment/Plan  1. Discussion about advance care planning held with family member -  Remains to be full code, updated IDT with current medications, husband is actively involved in care of resident   2. Essential hypertension, benign - well-controlled, 300 mg 1 tab twice a day and clonidine 0.1 mg 1 tab twice a day   3. Alzheimer's dementia without behavioral disturbance, unspecified timing of dementia onset (HCC) -advanced, continue supportive care    Family/ staff Communication: Discussed plan of care with resident.  Labs/tests ordered:  None  Goals of care:   Long-term care.   Kenard Gower, NP [facility] [phone] (Monday-Friday 8:00 a.m. - 5:00 p.m.) [phone] (after hours)

## 2018-07-25 DIAGNOSIS — L988 Other specified disorders of the skin and subcutaneous tissue: Secondary | ICD-10-CM | POA: Diagnosis not present

## 2018-07-26 DIAGNOSIS — R293 Abnormal posture: Secondary | ICD-10-CM | POA: Diagnosis not present

## 2018-07-26 DIAGNOSIS — L89152 Pressure ulcer of sacral region, stage 2: Secondary | ICD-10-CM | POA: Diagnosis not present

## 2018-07-27 DIAGNOSIS — L89152 Pressure ulcer of sacral region, stage 2: Secondary | ICD-10-CM | POA: Diagnosis not present

## 2018-07-27 DIAGNOSIS — R293 Abnormal posture: Secondary | ICD-10-CM | POA: Diagnosis not present

## 2018-07-28 DIAGNOSIS — R293 Abnormal posture: Secondary | ICD-10-CM | POA: Diagnosis not present

## 2018-07-28 DIAGNOSIS — L89152 Pressure ulcer of sacral region, stage 2: Secondary | ICD-10-CM | POA: Diagnosis not present

## 2018-07-29 DIAGNOSIS — L89152 Pressure ulcer of sacral region, stage 2: Secondary | ICD-10-CM | POA: Diagnosis not present

## 2018-07-29 DIAGNOSIS — R293 Abnormal posture: Secondary | ICD-10-CM | POA: Diagnosis not present

## 2018-07-31 DIAGNOSIS — R293 Abnormal posture: Secondary | ICD-10-CM | POA: Diagnosis not present

## 2018-07-31 DIAGNOSIS — L89152 Pressure ulcer of sacral region, stage 2: Secondary | ICD-10-CM | POA: Diagnosis not present

## 2018-08-01 DIAGNOSIS — B351 Tinea unguium: Secondary | ICD-10-CM | POA: Diagnosis not present

## 2018-08-01 DIAGNOSIS — L89152 Pressure ulcer of sacral region, stage 2: Secondary | ICD-10-CM | POA: Diagnosis not present

## 2018-08-01 DIAGNOSIS — I739 Peripheral vascular disease, unspecified: Secondary | ICD-10-CM | POA: Diagnosis not present

## 2018-08-01 DIAGNOSIS — R293 Abnormal posture: Secondary | ICD-10-CM | POA: Diagnosis not present

## 2018-08-01 DIAGNOSIS — L603 Nail dystrophy: Secondary | ICD-10-CM | POA: Diagnosis not present

## 2018-08-02 DIAGNOSIS — R293 Abnormal posture: Secondary | ICD-10-CM | POA: Diagnosis not present

## 2018-08-02 DIAGNOSIS — L89152 Pressure ulcer of sacral region, stage 2: Secondary | ICD-10-CM | POA: Diagnosis not present

## 2018-08-05 DIAGNOSIS — L89152 Pressure ulcer of sacral region, stage 2: Secondary | ICD-10-CM | POA: Diagnosis not present

## 2018-08-05 DIAGNOSIS — R293 Abnormal posture: Secondary | ICD-10-CM | POA: Diagnosis not present

## 2018-08-06 DIAGNOSIS — L89152 Pressure ulcer of sacral region, stage 2: Secondary | ICD-10-CM | POA: Diagnosis not present

## 2018-08-06 DIAGNOSIS — R293 Abnormal posture: Secondary | ICD-10-CM | POA: Diagnosis not present

## 2018-08-08 DIAGNOSIS — L89152 Pressure ulcer of sacral region, stage 2: Secondary | ICD-10-CM | POA: Diagnosis not present

## 2018-08-08 DIAGNOSIS — L988 Other specified disorders of the skin and subcutaneous tissue: Secondary | ICD-10-CM | POA: Diagnosis not present

## 2018-08-08 DIAGNOSIS — R293 Abnormal posture: Secondary | ICD-10-CM | POA: Diagnosis not present

## 2018-08-10 DIAGNOSIS — L89152 Pressure ulcer of sacral region, stage 2: Secondary | ICD-10-CM | POA: Diagnosis not present

## 2018-08-10 DIAGNOSIS — R293 Abnormal posture: Secondary | ICD-10-CM | POA: Diagnosis not present

## 2018-08-11 DIAGNOSIS — L89152 Pressure ulcer of sacral region, stage 2: Secondary | ICD-10-CM | POA: Diagnosis not present

## 2018-08-11 DIAGNOSIS — R293 Abnormal posture: Secondary | ICD-10-CM | POA: Diagnosis not present

## 2018-08-12 DIAGNOSIS — L89152 Pressure ulcer of sacral region, stage 2: Secondary | ICD-10-CM | POA: Diagnosis not present

## 2018-08-12 DIAGNOSIS — R293 Abnormal posture: Secondary | ICD-10-CM | POA: Diagnosis not present

## 2018-08-13 DIAGNOSIS — R293 Abnormal posture: Secondary | ICD-10-CM | POA: Diagnosis not present

## 2018-08-13 DIAGNOSIS — L89152 Pressure ulcer of sacral region, stage 2: Secondary | ICD-10-CM | POA: Diagnosis not present

## 2018-08-15 DIAGNOSIS — L89152 Pressure ulcer of sacral region, stage 2: Secondary | ICD-10-CM | POA: Diagnosis not present

## 2018-08-15 DIAGNOSIS — R293 Abnormal posture: Secondary | ICD-10-CM | POA: Diagnosis not present

## 2018-08-18 ENCOUNTER — Encounter: Payer: Self-pay | Admitting: Internal Medicine

## 2018-08-18 ENCOUNTER — Non-Acute Institutional Stay (SKILLED_NURSING_FACILITY): Payer: Medicare Other | Admitting: Internal Medicine

## 2018-08-18 DIAGNOSIS — I10 Essential (primary) hypertension: Secondary | ICD-10-CM

## 2018-08-18 DIAGNOSIS — F0151 Vascular dementia with behavioral disturbance: Secondary | ICD-10-CM

## 2018-08-18 DIAGNOSIS — F01518 Vascular dementia, unspecified severity, with other behavioral disturbance: Secondary | ICD-10-CM

## 2018-08-18 DIAGNOSIS — L89159 Pressure ulcer of sacral region, unspecified stage: Secondary | ICD-10-CM | POA: Diagnosis not present

## 2018-08-18 NOTE — Progress Notes (Signed)
NURSING HOME LOCATION:  [facility] ROOM NUMBER:  117-A  CODE STATUS:  Full Code  PCP:  Pecola Lawless, MD  [address] [place] [address]  This is a nursing facility follow up of chronic medical diagnoses.   Interim medical record and care since last [facility] visit was updated with review of diagnostic studies and change in clinical status since last visit were documented.  HPI: Patient has been a permanent resident of this facility since October 2013.  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  Other medical diagnoses include essential hypertension, history of aspiration pneumonia due to dysphagia, CKD, anemia of chronic disease, dyslipidemia, and vitamin D deficiency.  I discussed her status with her husband as she is nonverbal.  Hospice had been involved for a while but this was rescinded by her husband.  Her son had been in favor of this designation because of her irreversible state.   The husband alleges that when she arrived that her only issue was early dementia. The family history is pertinent in that there is a history of dementia in both parents and a female and female sib.  He inferred that she had been very physically active.  Apparently she had fallen and broken both wrists which were casted.  He thought she was to get restorative therapy and intimated he was upset that this was not initiated.  He also is concerned that she may have a sacral wound.  He feels that she should be in bed off her buttocks for at least several hours a day.  He does come to the facility 3 times a day to feed his wife.  Review of systems: The husband denies any other active issues.   Physical exam:  Pertinent or positive findings: The patient is nonverbal and does not interact with the examiner.  She is in the reclining chair with her eyes closed.  She resists my attempts to open  the eyes.  Pattern  alopecia is present especially over the crown.  Her head and neck are turned to the right and her mouth is agape.No definite torticollis present.  She has eroded maxillary teeth.  Crescendo decrescendo grade 1 systolic murmur is present at the right base.  Breath sounds are decreased.  Her arms are crossed over the epigastric area and exhibit increased tone when range of motion is tested.  She also has decreased range of motion and increased tone in the lower extremities also.  Pedal pulses are decreased.  When the lower extremities are stimulated the toes are upgoing.  General appearance: Adequately nourished; no acute distress, increased work of breathing is present.   Lymphatic: No lymphadenopathy about the head, neck, axilla. Ears:  External ear exam shows no significant lesions or deformities.   Nose:  External nasal examination shows no deformity or inflammation. Nasal mucosa are pink and moist without lesions, exudates Oral exam:  Lips and gums are healthy appearing. Neck:  No thyromegaly, masses, tenderness noted.    Heart:  Normal rate and regular rhythm. S1 and S2 normal without gallop, click, rub .  Lungs: without wheezes, rhonchi, rales, rubs. Abdomen: Bowel sounds are normal. Abdomen is soft and nontender with no organomegaly, hernias, masses. GU: Deferred  Extremities:  No cyanosis, clubbing, edema  Neurologic exam : Balance, Rhomberg, finger to nose testing could not be completed due to clinical state Skin: Warm & dry w/o  tenting. No significant lesions or rash but sacral exam deferred to SNF Wound Care Nurse.  See summary under each active problem in the Problem List with associated updated therapeutic plan

## 2018-08-18 NOTE — Assessment & Plan Note (Signed)
No significant behavioral issues described by staff Hospice and DNR status is clinically appropriate, but her husband will not accept this This condition is not reversible

## 2018-08-18 NOTE — Assessment & Plan Note (Addendum)
Wound Care Nurse consulted and asked to reassess & treat any active sacral wound

## 2018-08-18 NOTE — Patient Instructions (Signed)
See assessment and plan under each diagnosis in the problem list and acutely for this visit 

## 2018-08-18 NOTE — Assessment & Plan Note (Signed)
BP controlled; no change in antihypertensive medications  

## 2018-09-13 ENCOUNTER — Non-Acute Institutional Stay (SKILLED_NURSING_FACILITY): Payer: Medicare Other | Admitting: Adult Health

## 2018-09-13 ENCOUNTER — Encounter: Payer: Self-pay | Admitting: Adult Health

## 2018-09-13 DIAGNOSIS — F0150 Vascular dementia without behavioral disturbance: Secondary | ICD-10-CM

## 2018-09-13 DIAGNOSIS — I10 Essential (primary) hypertension: Secondary | ICD-10-CM | POA: Diagnosis not present

## 2018-09-13 DIAGNOSIS — Z8673 Personal history of transient ischemic attack (TIA), and cerebral infarction without residual deficits: Secondary | ICD-10-CM | POA: Diagnosis not present

## 2018-09-13 DIAGNOSIS — R1319 Other dysphagia: Secondary | ICD-10-CM | POA: Diagnosis not present

## 2018-09-13 NOTE — Progress Notes (Signed)
Location:  [facility] Room Number: 117-A Place of Service:  SNF (31) Provider:  Kenard GowerMedina-Vargas, , NP  Patient Care Team: Pecola LawlessHopper, William F, MD as PCP - General (Internal Medicine) Gillis SantaMedina-Vargas,  C, NP as Nurse Practitioner (Internal Medicine)  Extended Emergency Contact Information Primary Emergency Contact: Jasmer,[name] Address: [address]          [place], [place] [address] [place] Home Phone: [phone] Mobile Phone: [phone] Relation: Spouse Secondary Emergency Contact: Romeo AppleWilliamson,[name] J  [place] Mobile Phone: [phone] Relation: Son  Code Status:  Full Code  Goals of care: Advanced Directive information Advanced Directives 04/20/2018  Does Patient Have a Medical Advance Directive? Yes  Type of Advance Directive -  Does patient want to make changes to medical advance directive? -  Copy of Healthcare Power of Attorney in Chart? -  Would patient like information on creating a medical advance directive? -  Pre-existing out of facility DNR order (yellow form or pink MOST form) -     Chief Complaint  Patient presents with  . Medical Management of Chronic Issues    Routine [facility] visit    HPI:  Pt is a 73 y.o. female seen today for medical management of chronic diseases.  She is a [facility] care [facility].  She has a PMH of Alzheimer's disease, CKD, CVA, hypertension, HLD, chronic constipation, and at baseline is nonverbal and nonambulatory. She was seen in the room today with husband at bedside. She just finished her breakfast and she ate 75%. She was seen sleeping. Husband said that she was awake while eating and just started napping. Left gluteal fold has pink scarring due to moisture damage. She recently finished Tamiflu prophylaxis due to Influenza exposure. There was no reported fever.   Past Medical History:  Diagnosis Date  . Dementia  (HCC)    "alzheimer's" (05/18/2012)  . Fall 05/17/2012  . Fracture of medial wall of orbit 05/17/2012  . Gout    "? feet" (05/18/2012); uric acid 9.4 on 08/06/16 with R wrist pain; 7.1 on 08/15/16 on Allopurinol  . Hypertension   . Incontinence of urine   . Stroke [facility](HCC) ~ 2003   "slight memory loss" (05/18/2012)  . Wrist fracture, bilateral 05/17/2012   "fell down steps" (05/18/2012)   Past Surgical History:  Procedure Laterality Date  . fractured tooth     01/06/17 Dr Ocie DoyneScott Jensen DMD extracted tooth  #4   . VAGINAL HYSTERECTOMY      Allergies  Allergen Reactions  . Pollen Extract Other (See Comments)    rhinitis    Outpatient Encounter Medications as of 09/13/2018  Medication Sig  . barrier cream (NON-SPECIFIED) CREA Apply 1 application topically 3 (three) times daily. Apply to inner left buttock each shift  . CALCIUM-VITAMIN D PO Take 500 mg by mouth daily.   . cloNIDine (CATAPRES) 0.1 MG tablet Take 0.1 mg by mouth 2 (two) times daily.   Marland Kitchen. labetalol (NORMODYNE) 300 MG tablet Take 300 mg by mouth 2 (two) times daily.  . Multiple Vitamins-Minerals (ELDERTONIC PO) Take 15 mLs by mouth 2 (two) times daily.  . Multiple Vitamins-Minerals (MULTIVITAMIN WITH MINERALS) tablet Take 1 tablet by mouth daily.  . Nutritional Supplements (NUTRITIONAL SUPPLEMENT PO) Take 1 each by mouth 2 (two) times daily. Magic Cup  . senna-docusate (SENOKOT-S) 8.6-50 MG tablet Take 1-2 tablets by mouth See admin instructions. Take 1 tablet QAM and 2 tablets QPM   No  facility-administered encounter medications on file as of 09/13/2018.     Review of Systems  Unable to obtain due to dementia    Immunization History  Administered Date(s) Administered  . Influenza-Unspecified 05/10/2013, 05/14/2016, 05/23/2017, 06/10/2018  . Pneumococcal-Unspecified 06/03/2012, 05/14/2016   Pertinent  Health Maintenance Due  Topic Date Due  . PNA vac Low Risk Adult (2 of 2 - PCV13) 05/14/2017  . MAMMOGRAM  03/09/2024  (Originally 11/26/2014)  . COLONOSCOPY  03/09/2024 (Originally 09/25/1993)  . INFLUENZA VACCINE  Completed  . DEXA SCAN  Discontinued   Fall Risk  04/07/2018 04/06/2017 09/23/2016 09/11/2016 06/10/2016  Falls in the past year? No Yes No No No  Number falls in past yr: - 1 - - -  Injury with Fall? - Yes - - -      Vitals:   09/13/18 1225  BP: (!) 142/70  Pulse: 62  Resp: 18  Temp: 98.5 F (36.9 C)  TempSrc: Oral  Weight: 112 lb 9.6 oz (51.1 kg)  Height: 5\' 1"  (1.549 m)   Body mass index is 21.28 kg/m.  Physical Exam  GENERAL APPEARANCE: Well nourished. In no acute distress. SKIN:  Pinkish scarring of left gluteal fold MOUTH and THROAT: Lips are without lesions. Oral mucosa is moist and without lesions.  RESPIRATORY: Breathing is even & unlabored, BS CTAB CARDIAC: RRR, no murmur,no extra heart sounds, no edema GI: Abdomen soft, normal BS, no masses, no tenderness EXTREMITIES: Contracted bilateral arms NEUROLOGICAL: There is no tremor. Nonverbal   Labs reviewed: Recent Labs    11/11/17 1843 11/11/17 2100 11/12/17 0957 11/13/17 0250  NA 151*  --  143 143  K 2.7*  --  3.3* 4.0  CL 123*  --  119* 116*  CO2 17*  --  18* 18*  GLUCOSE 121*  --  103* 84  BUN 19  --  12 8  CREATININE 1.38*  --  1.23* 1.15*  CALCIUM 8.0*  --  7.9* 8.0*  MG  --  1.8  --   --    Recent Labs    11/09/17 2205  AST 19  ALT 14  ALKPHOS 65  BILITOT 1.0  PROT 7.9  ALBUMIN 2.9*   Recent Labs    11/09/17 2205 11/10/17 0242 11/11/17 0741 11/13/17 0840  WBC 25.4* 22.5* 15.1* 10.0  NEUTROABS 21.4*  --   --   --   HGB 13.0 12.2 11.4* 11.5*  HCT 41.2 39.5 37.0 36.2  MCV 95.6 96.1 95.6 91.6  PLT 328 284 275 281   Lab Results  Component Value Date   TSH 1.61 09/18/2016    Lab Results  Component Value Date   CHOL 126 12/17/2016   HDL 35 12/17/2016   LDLCALC 69 12/17/2016   TRIG 109 12/17/2016    Assessment/Plan  1. History of stroke - stable, continue Clonidine 0.1 mg 1 tab  twice daily and labetalol 300 mg 1 tab twice a day  2. Other dysphagia - currently on honey thick liquids, she is being sat up when being fed  3. Essential hypertension, benign - well-controlled, continue Clonidine 0.1 mg 1 tab twice daily and labetalol 300 mg 1 tab twice a day   4. Vascular dementia without behavioral disturbance (HCC) - advanced, continue supportive care, fall precautions   Family/ staff Communication: Discussed plan of care with husband.  Labs/tests ordered:  None  Goals of care:   Long-term care.   Kenard Gower, NP [facility] [phone] (Monday-Friday  8:00 a.m. - 5:00 p.m.) [phone] (after hours)

## 2018-10-10 ENCOUNTER — Non-Acute Institutional Stay (SKILLED_NURSING_FACILITY): Payer: Medicare Other | Admitting: Internal Medicine

## 2018-10-10 ENCOUNTER — Encounter: Payer: Self-pay | Admitting: Internal Medicine

## 2018-10-10 DIAGNOSIS — I739 Peripheral vascular disease, unspecified: Secondary | ICD-10-CM | POA: Diagnosis not present

## 2018-10-10 DIAGNOSIS — N183 Chronic kidney disease, stage 3 (moderate): Secondary | ICD-10-CM | POA: Diagnosis not present

## 2018-10-10 DIAGNOSIS — F0151 Vascular dementia with behavioral disturbance: Secondary | ICD-10-CM

## 2018-10-10 DIAGNOSIS — K5909 Other constipation: Secondary | ICD-10-CM

## 2018-10-10 DIAGNOSIS — I10 Essential (primary) hypertension: Secondary | ICD-10-CM | POA: Diagnosis not present

## 2018-10-10 DIAGNOSIS — B351 Tinea unguium: Secondary | ICD-10-CM | POA: Diagnosis not present

## 2018-10-10 DIAGNOSIS — F01518 Vascular dementia, unspecified severity, with other behavioral disturbance: Secondary | ICD-10-CM

## 2018-10-10 DIAGNOSIS — N179 Acute kidney failure, unspecified: Secondary | ICD-10-CM | POA: Diagnosis not present

## 2018-10-10 NOTE — Progress Notes (Signed)
Location:    [facility] Room Number: 117/A Place of Service:  SNF [phone]) Provider: Edmon Crape PA-C  Pecola Lawless, MD  Patient Care Team: Pecola Lawless, MD as PCP - General (Internal Medicine) Gillis Santa, NP as Nurse Practitioner (Internal Medicine)  Extended Emergency Contact Information Primary Emergency Contact: Crofford,[name] Address: [address]          [place], [place] [address] [place] Home Phone: [phone] Mobile Phone: [phone] Relation: Spouse Secondary Emergency Contact: Romeo [place] Mobile Phone: [phone] Relation: Son  Code Status:  Full Code Goals of care: Advanced Directive information Advanced Directives 10/10/2018  Does Patient Have a Medical Advance Directive? Yes  Type of Advance Directive Out of facility DNR (pink MOST or yellow form)  Does patient want to make changes to medical advance directive? No - Patient declined  Copy of Healthcare Power of Attorney in Chart? -  Would patient like information on creating a medical advance directive? -  Pre-existing out of facility DNR order (yellow form or pink MOST form) -     Chief Complaint  Patient presents with  . Medical Management of Chronic Issues    Routine visit of medical management  Medical management of chronic medical conditions include Alzheimer's dementia as well as chronic kidney disease history of CVA hypertension.    HPI:  Pt is a 73 y.o. female seen today for medical management of chronic diseases as noted above. Patient has a history of severe dementia- Per staff they do not report any recent acute issues she is nonverbal.  She does have a very supportive husband who is here apparently about 3 times a day to help feed her.  Her weight is down about 3 pounds with it appears since the beginning of the year although an updated weight is pending.  She is on  Magic cup and Eldertonic according to her husband she eats well.  She also has a history of hypertension and is on clonidine 0.1 mg twice daily and labetalol 300 mg twice a day blood pressures appear to be stable largely in the lower 100s systolically 60s and 70s diastolically.      Past Medical History:  Diagnosis Date  . Dementia (HCC)    "alzheimer's" (05/18/2012)  . Fall 05/17/2012  . Fracture of medial wall of orbit 05/17/2012  . Gout    "? feet" (05/18/2012); uric acid 9.4 on 08/06/16 with R wrist pain; 7.1 on 08/15/16 on Allopurinol  . Hypertension   . Incontinence of urine   . Stroke [facility]) ~ 2003   "slight memory loss" (05/18/2012)  . Wrist fracture, bilateral 05/17/2012   "fell down steps" (05/18/2012)   Past Surgical History:  Procedure Laterality Date  . fractured tooth     01/06/17 Dr Ocie Doyne DMD extracted tooth  #4   . VAGINAL HYSTERECTOMY      Allergies  Allergen Reactions  . Pollen Extract Other (See Comments)    rhinitis    Allergies as of 10/10/2018      Reactions   Pollen Extract Other (See Comments)   rhinitis      Medication List       Accurate as of October 10, 2018  9:24 AM. Always use your most recent med list.        barrier cream Crea Commonly known as:  non-specified Apply 1 application topically 3 (three) times daily. Apply to  inner left buttock each shift   BISACODYL LAXATIVE 10 MG suppository Generic drug:  bisacodyl Constipation ( 1 of 4) If not relieved by MOM give 10 mg Bisacodyl suppository rectally X 1 dose in 24 hours as needed ( Do not use consitpation standing orders for resident with renal failure /CFR less than 30. Contact Md for orders)   cloNIDine 0.1 MG tablet Commonly known as:  CATAPRES Take 0.1 mg by mouth 2 (two) times daily.   [employer] OYSTER SHELL CALCIUM+VIT D PO Take 500 mg by mouth daily.   ELDERTONIC PO Take 15 mLs by mouth 2 (two) times daily.   multivitamin with minerals tablet Take 1 tablet by mouth daily.     labetalol 300 MG tablet Commonly known as:  NORMODYNE Take 300 mg by mouth 2 (two) times daily.   magnesium hydroxide 400 MG/5ML suspension Commonly known as:  MILK OF MAGNESIA Constipation (2 of 4) If no BM in 3 days, give 30 cc Milk of Magnesium p.o. x 1 dose in 24 hours as needed (Do not use standing constipation orders for residents with renal failure CFR less than 30. Contact MD for orders)   NUTRITIONAL SUPPLEMENT PO Take 1 each by mouth 2 (two) times daily. Magic Cup   RA SALINE ENEMA RE Constipation (3 of 4) If not relieved by Bisacodyl suppository, give disposable Saline Enema rectally x 1 dose/ 24 hrs as needed ( Do not use constipation standing orders for residents with renal failure/ CFR less than 30. Contact MD for orders)                 Constipation (4 of 4) Contact MD as needed if no results from enema   senna-docusate 8.6-50 MG tablet Commonly known as:  Senokot-S Take 1-2 tablets by mouth See admin instructions. Take 1 tablet QAM and 2 tablets QPM       Review of Systems   Is unobtainable secondary to dementia  Immunization History  Administered Date(s) Administered  . Influenza-Unspecified 05/10/2013, 05/14/2016, 05/23/2017, 06/10/2018  . Pneumococcal-Unspecified 06/03/2012, 05/14/2016   Pertinent  Health Maintenance Due  Topic Date Due  . PNA vac Low Risk Adult (2 of 2 - PCV13) 11/10/2018 (Originally 05/14/2017)  . COLONOSCOPY  03/09/2024 (Originally 09/25/1993)  . INFLUENZA VACCINE  Completed  . DEXA SCAN  Discontinued   Fall Risk  04/07/2018 04/06/2017 09/23/2016 09/11/2016 06/10/2016  Falls in the past year? No Yes No No No  Number falls in past yr: - 1 - - -  Injury with Fall? - Yes - - -   Functional Status Survey:    Vitals:   10/10/18 0844  BP: 112/70  Pulse: 78  Resp: 20  Temp: 97.8 F (36.6 C)  TempSrc: Oral  SpO2: 95%  Weight: 112 lb 3.2 oz (50.9 kg)  Height:  (1.549 m)   Body mass index is 21.2 kg/m. Physical Exam   In general  this is a frail elderly female who does not appear to be in any distress she is nonverbal and not interactive.  Her skin is warm and dry.  Eyes she did not open her eyes for examination she does have some history of extropion of the right eye  Mouth did not open very wide again she does not really respond to verbal commands she does have plaque on her teeth which appears to be baseline.  Chest is clear to auscultation with shallow air entry could not really appreciate any overt congestion there is no  labored breathing.  Heart is regular rate and rhythm without murmur gallop or rub she does not have significant lower extremity edema.  Abdomen is soft nontender with positive bowel sounds.  Musculoskeletal has general rigidity holds her arms in a flexed position- again does not follow any verbal commands.  Neurologic as noted above could not really appreciate true lateralizing findings.  And psych findings consistent with severe dementia noninteractive nonverbal.    Labs reviewed: Recent Labs    11/11/17 1843 11/11/17 2100 11/12/17 0957 11/13/17 0250  NA 151*  --  143 143  K 2.7*  --  3.3* 4.0  CL 123*  --  119* 116*  CO2 17*  --  18* 18*  GLUCOSE 121*  --  103* 84  BUN 19  --  12 8  CREATININE 1.38*  --  1.23* 1.15*  CALCIUM 8.0*  --  7.9* 8.0*  MG  --  1.8  --   --    Recent Labs    11/09/17 2205  AST 19  ALT 14  ALKPHOS 65  BILITOT 1.0  PROT 7.9  ALBUMIN 2.9*   Recent Labs    11/09/17 2205 11/10/17 0242 11/11/17 0741 11/13/17 0840  WBC 25.4* 22.5* 15.1* 10.0  NEUTROABS 21.4*  --   --   --   HGB 13.0 12.2 11.4* 11.5*  HCT 41.2 39.5 37.0 36.2  MCV 95.6 96.1 95.6 91.6  PLT 328 284 275 281   Lab Results  Component Value Date   TSH 1.61 09/18/2016   No results found for: HGBA1C Lab Results  Component Value Date   CHOL 126 12/17/2016   HDL 35 12/17/2016   LDLCALC 69 12/17/2016   TRIG 109 12/17/2016    Significant Diagnostic Results in last 30 days:   No results found.  Assessment/Plan  #1 history of Alzheimer's dementia this appears to be severe-she is lost a small amount of weight although this could be a scale variation again update weight is pending for this month she is on Magic cup and Eldertonic and has a very supportive husband who helps to feed her at this point will monitor continue supportive care.  2.  History of chronic kidney disease last creatinine was 1.15 will update this for updated values according to her husband she eats and drinks quite well.  3 history of CVA continue supportive care.  4.  Hypertension continues on clonidine 0.1 mg twice daily and labetalol 300 mg p.o. twice daily this appears to be stable as noted above.  5.  History of constipation continues on Senokot  Again will update a CBC and metabolic panel for updated values clinically she appears to be at baseline no signs of discomfort  [phone]

## 2018-10-14 DIAGNOSIS — Z79899 Other long term (current) drug therapy: Secondary | ICD-10-CM | POA: Diagnosis not present

## 2018-10-14 DIAGNOSIS — D649 Anemia, unspecified: Secondary | ICD-10-CM | POA: Diagnosis not present

## 2018-10-28 ENCOUNTER — Encounter (HOSPITAL_COMMUNITY): Payer: Self-pay

## 2018-10-28 ENCOUNTER — Emergency Department (HOSPITAL_COMMUNITY): Payer: Medicare Other

## 2018-10-28 ENCOUNTER — Inpatient Hospital Stay (HOSPITAL_COMMUNITY)
Admission: EM | Admit: 2018-10-28 | Discharge: 2018-11-05 | DRG: 871 | Disposition: A | Discharge status: Hospice | Payer: Medicare Other | Attending: Internal Medicine | Admitting: Internal Medicine

## 2018-10-28 ENCOUNTER — Other Ambulatory Visit: Payer: Self-pay

## 2018-10-28 DIAGNOSIS — R059 Cough, unspecified: Secondary | ICD-10-CM

## 2018-10-28 DIAGNOSIS — N183 Chronic kidney disease, stage 3 (moderate): Secondary | ICD-10-CM | POA: Diagnosis present

## 2018-10-28 DIAGNOSIS — M2450 Contracture, unspecified joint: Secondary | ICD-10-CM | POA: Diagnosis not present

## 2018-10-28 DIAGNOSIS — R509 Fever, unspecified: Secondary | ICD-10-CM

## 2018-10-28 DIAGNOSIS — F0391 Unspecified dementia with behavioral disturbance: Secondary | ICD-10-CM | POA: Diagnosis present

## 2018-10-28 DIAGNOSIS — B9561 Methicillin susceptible Staphylococcus aureus infection as the cause of diseases classified elsewhere: Secondary | ICD-10-CM | POA: Diagnosis present

## 2018-10-28 DIAGNOSIS — L89102 Pressure ulcer of unspecified part of back, stage 2: Secondary | ICD-10-CM | POA: Diagnosis present

## 2018-10-28 DIAGNOSIS — Z681 Body mass index (BMI) 19 or less, adult: Secondary | ICD-10-CM

## 2018-10-28 DIAGNOSIS — R6520 Severe sepsis without septic shock: Secondary | ICD-10-CM | POA: Diagnosis not present

## 2018-10-28 DIAGNOSIS — F0280 Dementia in other diseases classified elsewhere without behavioral disturbance: Secondary | ICD-10-CM | POA: Diagnosis present

## 2018-10-28 DIAGNOSIS — Z8673 Personal history of transient ischemic attack (TIA), and cerebral infarction without residual deficits: Secondary | ICD-10-CM

## 2018-10-28 DIAGNOSIS — G9341 Metabolic encephalopathy: Secondary | ICD-10-CM | POA: Diagnosis present

## 2018-10-28 DIAGNOSIS — G309 Alzheimer's disease, unspecified: Secondary | ICD-10-CM | POA: Diagnosis present

## 2018-10-28 DIAGNOSIS — Z515 Encounter for palliative care: Secondary | ICD-10-CM

## 2018-10-28 DIAGNOSIS — E870 Hyperosmolality and hypernatremia: Secondary | ICD-10-CM | POA: Diagnosis present

## 2018-10-28 DIAGNOSIS — F0151 Vascular dementia with behavioral disturbance: Secondary | ICD-10-CM | POA: Diagnosis not present

## 2018-10-28 DIAGNOSIS — R54 Age-related physical debility: Secondary | ICD-10-CM | POA: Diagnosis present

## 2018-10-28 DIAGNOSIS — Z7189 Other specified counseling: Secondary | ICD-10-CM

## 2018-10-28 DIAGNOSIS — I129 Hypertensive chronic kidney disease with stage 1 through stage 4 chronic kidney disease, or unspecified chronic kidney disease: Secondary | ICD-10-CM | POA: Diagnosis not present

## 2018-10-28 DIAGNOSIS — R0602 Shortness of breath: Secondary | ICD-10-CM

## 2018-10-28 DIAGNOSIS — E876 Hypokalemia: Secondary | ICD-10-CM | POA: Diagnosis present

## 2018-10-28 DIAGNOSIS — J690 Pneumonitis due to inhalation of food and vomit: Secondary | ICD-10-CM | POA: Diagnosis not present

## 2018-10-28 DIAGNOSIS — A419 Sepsis, unspecified organism: Secondary | ICD-10-CM | POA: Diagnosis not present

## 2018-10-28 DIAGNOSIS — R4020 Unspecified coma: Secondary | ICD-10-CM | POA: Diagnosis not present

## 2018-10-28 DIAGNOSIS — F03918 Unspecified dementia, unspecified severity, with other behavioral disturbance: Secondary | ICD-10-CM | POA: Diagnosis present

## 2018-10-28 DIAGNOSIS — I10 Essential (primary) hypertension: Secondary | ICD-10-CM | POA: Diagnosis present

## 2018-10-28 DIAGNOSIS — M109 Gout, unspecified: Secondary | ICD-10-CM | POA: Diagnosis not present

## 2018-10-28 DIAGNOSIS — F0281 Dementia in other diseases classified elsewhere with behavioral disturbance: Secondary | ICD-10-CM | POA: Diagnosis not present

## 2018-10-28 DIAGNOSIS — R402432 Glasgow coma scale score 3-8, at arrival to emergency department: Secondary | ICD-10-CM | POA: Diagnosis present

## 2018-10-28 DIAGNOSIS — Z66 Do not resuscitate: Secondary | ICD-10-CM | POA: Diagnosis present

## 2018-10-28 DIAGNOSIS — Z951 Presence of aortocoronary bypass graft: Secondary | ICD-10-CM | POA: Diagnosis not present

## 2018-10-28 DIAGNOSIS — Z87891 Personal history of nicotine dependence: Secondary | ICD-10-CM

## 2018-10-28 DIAGNOSIS — D638 Anemia in other chronic diseases classified elsewhere: Secondary | ICD-10-CM | POA: Diagnosis not present

## 2018-10-28 DIAGNOSIS — J9601 Acute respiratory failure with hypoxia: Secondary | ICD-10-CM | POA: Diagnosis present

## 2018-10-28 DIAGNOSIS — K56609 Unspecified intestinal obstruction, unspecified as to partial versus complete obstruction: Secondary | ICD-10-CM

## 2018-10-28 DIAGNOSIS — R05 Cough: Secondary | ICD-10-CM

## 2018-10-28 DIAGNOSIS — E860 Dehydration: Secondary | ICD-10-CM | POA: Diagnosis not present

## 2018-10-28 DIAGNOSIS — R7881 Bacteremia: Secondary | ICD-10-CM | POA: Diagnosis not present

## 2018-10-28 DIAGNOSIS — R404 Transient alteration of awareness: Secondary | ICD-10-CM | POA: Diagnosis not present

## 2018-10-28 DIAGNOSIS — Z0189 Encounter for other specified special examinations: Secondary | ICD-10-CM

## 2018-10-28 DIAGNOSIS — R6510 Systemic inflammatory response syndrome (SIRS) of non-infectious origin without acute organ dysfunction: Secondary | ICD-10-CM | POA: Diagnosis present

## 2018-10-28 DIAGNOSIS — L89152 Pressure ulcer of sacral region, stage 2: Secondary | ICD-10-CM | POA: Diagnosis present

## 2018-10-28 DIAGNOSIS — N179 Acute kidney failure, unspecified: Secondary | ICD-10-CM | POA: Diagnosis not present

## 2018-10-28 DIAGNOSIS — R079 Chest pain, unspecified: Secondary | ICD-10-CM

## 2018-10-28 DIAGNOSIS — Z90710 Acquired absence of both cervix and uterus: Secondary | ICD-10-CM

## 2018-10-28 DIAGNOSIS — Z4659 Encounter for fitting and adjustment of other gastrointestinal appliance and device: Secondary | ICD-10-CM

## 2018-10-28 DIAGNOSIS — Z978 Presence of other specified devices: Secondary | ICD-10-CM

## 2018-10-28 DIAGNOSIS — G300 Alzheimer's disease with early onset: Secondary | ICD-10-CM | POA: Diagnosis not present

## 2018-10-28 DIAGNOSIS — R000 Tachycardia, unspecified: Secondary | ICD-10-CM | POA: Diagnosis not present

## 2018-10-28 LAB — COMPREHENSIVE METABOLIC PANEL
ALT: 22 U/L (ref 0–44)
AST: 24 U/L (ref 15–41)
Albumin: 3.8 g/dL (ref 3.5–5.0)
Alkaline Phosphatase: 75 U/L (ref 38–126)
Anion gap: 11 (ref 5–15)
BUN: 35 mg/dL — ABNORMAL HIGH (ref 8–23)
CO2: 24 mmol/L (ref 22–32)
Calcium: 10.2 mg/dL (ref 8.9–10.3)
Chloride: 112 mmol/L — ABNORMAL HIGH (ref 98–111)
Creatinine, Ser: 2.36 mg/dL — ABNORMAL HIGH (ref 0.44–1.00)
GFR calc Af Amer: 23 mL/min — ABNORMAL LOW (ref >60)
GFR calc non Af Amer: 19 mL/min — ABNORMAL LOW (ref >60)
GLUCOSE: 133 mg/dL — AB (ref 70–99)
Potassium: 4 mmol/L (ref 3.5–5.1)
SODIUM: 147 mmol/L — AB (ref 135–145)
Total Bilirubin: 0.8 mg/dL (ref 0.3–1.2)
Total Protein: 8.3 g/dL — ABNORMAL HIGH (ref 6.5–8.1)

## 2018-10-28 LAB — CBC WITH DIFFERENTIAL/PLATELET
ABS IMMATURE GRANULOCYTES: 0.06 10*3/uL (ref 0.00–0.07)
Basophils Absolute: 0 10*3/uL (ref 0.0–0.1)
Basophils Relative: 0 %
Eosinophils Absolute: 0 10*3/uL (ref 0.0–0.5)
Eosinophils Relative: 0 %
HCT: 47.2 % — ABNORMAL HIGH (ref 36.0–46.0)
Hemoglobin: 14.4 g/dL (ref 12.0–15.0)
IMMATURE GRANULOCYTES: 0 %
Lymphocytes Relative: 12 %
Lymphs Abs: 1.6 10*3/uL (ref 0.7–4.0)
MCH: 29 pg (ref 26.0–34.0)
MCHC: 30.5 g/dL (ref 30.0–36.0)
MCV: 95.2 fL (ref 80.0–100.0)
Monocytes Absolute: 0.9 10*3/uL (ref 0.1–1.0)
Monocytes Relative: 7 %
Neutro Abs: 10.8 10*3/uL — ABNORMAL HIGH (ref 1.7–7.7)
Neutrophils Relative %: 81 %
Platelets: 260 10*3/uL (ref 150–400)
RBC: 4.96 MIL/uL (ref 3.87–5.11)
RDW: 13.3 % (ref 11.5–15.5)
WBC: 13.4 10*3/uL — ABNORMAL HIGH (ref 4.0–10.5)
nRBC: 0 % (ref 0.0–0.2)

## 2018-10-28 LAB — URINALYSIS, ROUTINE W REFLEX MICROSCOPIC
BILIRUBIN URINE: NEGATIVE
Glucose, UA: NEGATIVE mg/dL
Hgb urine dipstick: NEGATIVE
Ketones, ur: 20 mg/dL — AB
Leukocytes,Ua: NEGATIVE
Nitrite: NEGATIVE
Protein, ur: 30 mg/dL — AB
Specific Gravity, Urine: 1.029 (ref 1.005–1.030)
pH: 5 (ref 5.0–8.0)

## 2018-10-28 LAB — LACTIC ACID, PLASMA: Lactic Acid, Venous: 1.4 mmol/L (ref 0.5–1.9)

## 2018-10-28 LAB — MRSA PCR SCREENING: MRSA by PCR: NEGATIVE

## 2018-10-28 LAB — INFLUENZA PANEL BY PCR (TYPE A & B)
Influenza A By PCR: NEGATIVE
Influenza B By PCR: NEGATIVE

## 2018-10-28 MED ORDER — SODIUM CHLORIDE 0.9 % IV BOLUS (SEPSIS)
1000.0000 mL | Freq: Once | INTRAVENOUS | Status: AC
  Administered 2018-10-28: 1000 mL via INTRAVENOUS

## 2018-10-28 MED ORDER — SODIUM CHLORIDE 0.9 % IV SOLN
INTRAVENOUS | Status: DC | PRN
  Administered 2018-10-28: 500 mL via INTRAVENOUS

## 2018-10-28 MED ORDER — LABETALOL HCL 200 MG PO TABS
300.0000 mg | ORAL_TABLET | Freq: Two times a day (BID) | ORAL | Status: DC
  Administered 2018-10-29 – 2018-10-30 (×3): 300 mg via ORAL
  Filled 2018-10-28 (×5): qty 1

## 2018-10-28 MED ORDER — CLONIDINE HCL 0.1 MG PO TABS
0.1000 mg | ORAL_TABLET | Freq: Two times a day (BID) | ORAL | Status: DC
  Administered 2018-10-29 – 2018-10-30 (×3): 0.1 mg via ORAL
  Filled 2018-10-28 (×4): qty 1

## 2018-10-28 MED ORDER — SODIUM CHLORIDE 0.9 % IV SOLN: 2.0000 g | Freq: Once | INTRAVENOUS | Status: DC

## 2018-10-28 MED ORDER — ACETAMINOPHEN 500 MG PO TABS
1000.0000 mg | ORAL_TABLET | Freq: Once | ORAL | Status: AC
  Administered 2018-10-28: 1000 mg via ORAL
  Filled 2018-10-28: qty 2

## 2018-10-28 MED ORDER — LABETALOL HCL 5 MG/ML IV SOLN
INTRAVENOUS | Status: AC
  Administered 2018-10-28: 23:00:00
  Filled 2018-10-28: qty 4

## 2018-10-28 MED ORDER — MAGNESIUM HYDROXIDE 400 MG/5ML PO SUSP: 30.0000 mL | Freq: Every day | ORAL | Status: DC | PRN

## 2018-10-28 MED ORDER — SODIUM CHLORIDE 0.45 % IV SOLN
INTRAVENOUS | Status: DC
  Administered 2018-10-28 – 2018-11-02 (×5): via INTRAVENOUS

## 2018-10-28 MED ORDER — BISACODYL 10 MG RE SUPP: 10.0000 mg | Freq: Every day | RECTAL | Status: DC | PRN

## 2018-10-28 MED ORDER — VANCOMYCIN VARIABLE DOSE PER UNSTABLE RENAL FUNCTION (PHARMACIST DOSING): Status: DC

## 2018-10-28 MED ORDER — LABETALOL HCL 5 MG/ML IV SOLN
10.0000 mg | Freq: Once | INTRAVENOUS | Status: AC
  Administered 2018-10-28: 10 mg via INTRAVENOUS

## 2018-10-28 MED ORDER — RA SALINE ENEMA 19-7 GM/118ML RE ENEM: ENEMA | Freq: Every day | RECTAL | Status: DC | PRN

## 2018-10-28 MED ORDER — ACETAMINOPHEN 325 MG PO TABS: 325.0000 mg | ORAL_TABLET | Freq: Once | ORAL | Status: DC

## 2018-10-28 MED ORDER — SENNOSIDES-DOCUSATE SODIUM 8.6-50 MG PO TABS
1.0000 | ORAL_TABLET | Freq: Two times a day (BID) | ORAL | Status: DC
  Administered 2018-10-29 – 2018-10-30 (×3): 1 via ORAL
  Filled 2018-10-28 (×4): qty 1

## 2018-10-28 MED ORDER — ACETAMINOPHEN 650 MG RE SUPP
650.0000 mg | Freq: Four times a day (QID) | RECTAL | Status: DC | PRN
  Administered 2018-11-04 – 2018-11-05 (×2): 650 mg via RECTAL
  Filled 2018-10-28 (×2): qty 1

## 2018-10-28 MED ORDER — ENSURE ENLIVE PO LIQD
Freq: Two times a day (BID) | ORAL | Status: DC
  Administered 2018-10-30: 22:00:00 via ORAL

## 2018-10-28 MED ORDER — SODIUM CHLORIDE 0.9 % IV SOLN
1.0000 g | INTRAVENOUS | Status: DC
  Administered 2018-10-28: 1 g via INTRAVENOUS
  Filled 2018-10-28 (×2): qty 1

## 2018-10-28 MED ORDER — SODIUM CHLORIDE 0.45 % IV SOLN
INTRAVENOUS | Status: DC
  Administered 2018-10-29: 05:00:00 via INTRAVENOUS

## 2018-10-28 MED ORDER — ONDANSETRON HCL 4 MG PO TABS: 4.0000 mg | ORAL_TABLET | Freq: Four times a day (QID) | ORAL | Status: DC | PRN

## 2018-10-28 MED ORDER — ONDANSETRON HCL 4 MG/2ML IJ SOLN: 4.0000 mg | Freq: Four times a day (QID) | INTRAMUSCULAR | Status: DC | PRN

## 2018-10-28 MED ORDER — BARRIER CREAM NON-SPECIFIED: 1.0000 "application " | TOPICAL_CREAM | Freq: Three times a day (TID) | TOPICAL | Status: DC

## 2018-10-28 MED ORDER — ADULT MULTIVITAMIN W/MINERALS CH
1.0000 | ORAL_TABLET | Freq: Every day | ORAL | Status: DC
  Filled 2018-10-28: qty 1

## 2018-10-28 MED ORDER — HYDRALAZINE HCL 20 MG/ML IJ SOLN
10.0000 mg | Freq: Four times a day (QID) | INTRAMUSCULAR | Status: DC | PRN
  Administered 2018-10-29 – 2018-11-04 (×4): 10 mg via INTRAVENOUS
  Filled 2018-10-28 (×4): qty 1

## 2018-10-28 MED ORDER — HEPARIN SODIUM (PORCINE) 5000 UNIT/ML IJ SOLN
5000.0000 [IU] | Freq: Three times a day (TID) | INTRAMUSCULAR | Status: DC
  Administered 2018-10-29 – 2018-11-03 (×19): 5000 [IU] via SUBCUTANEOUS
  Filled 2018-10-28 (×20): qty 1

## 2018-10-28 MED ORDER — CALCIUM CARBONATE-VITAMIN D 500-200 MG-UNIT PO TABS
ORAL_TABLET | Freq: Every day | ORAL | Status: DC
  Filled 2018-10-28: qty 1

## 2018-10-28 MED ORDER — VANCOMYCIN HCL IN DEXTROSE 1-5 GM/200ML-% IV SOLN
1000.0000 mg | Freq: Once | INTRAVENOUS | Status: AC
  Administered 2018-10-28: 1000 mg via INTRAVENOUS
  Filled 2018-10-28 (×2): qty 200

## 2018-10-28 MED ORDER — ACETAMINOPHEN 325 MG PO TABS: 650.0000 mg | ORAL_TABLET | Freq: Four times a day (QID) | ORAL | Status: DC | PRN

## 2018-10-28 NOTE — Progress Notes (Addendum)
Pharmacy Antibiotic Note  Amanda [name] is a 73 y.o. female admitted on 10/28/2018 with sepsis.  Pharmacy has been consulted for Vancomycin and Cefepime dosing.  Plan: Vanc 1000 mg Iv x 1, then dosing by pharmacy based on renal dysfunction. Cefepime 1 gm IV q24hr Monitor renal function, C&S, and vanc levels as needed  Height: 5\' 1"  (154.9 cm) Weight: 112 lb 3.4 oz (50.9 kg) IBW/kg (Calculated) : 47.8  Temp (24hrs), Avg:101.4 F (38.6 C), Min:101.4 F (38.6 C), Max:101.4 F (38.6 C)  Recent Labs  Lab 10/28/18 1020  WBC 13.4*  CREATININE 2.36*  LATICACIDVEN 1.4    Estimated Creatinine Clearance: 15.5 mL/min (A) (by C-G formula based on SCr of 2.36 mg/dL (H)).    Allergies  Allergen Reactions  . Pollen Extract Other (See Comments)    rhinitis    Antimicrobials this admission: Vanc 3/20 >>  Cefepime 3/20 >>   Thank you for allowing pharmacy to be a part of this patient's care.  Jeanella Cara, PharmD, [facility] Clinical Pharmacist Please see AMION for all Pharmacists' Contact Phone Numbers 10/28/2018, 1:21 PM

## 2018-10-28 NOTE — ED Triage Notes (Signed)
Pt arrives with [facility] from [facility] c/o fever of 101 per facility (unknown if oral or rectal). Per EMS, her oral temp en route was 100.6. Pt's altered mental status is the pt's baseline per the facility. Pt is non verbal and can open eyes to pain. Pt has hx of stroke and resulting contractures per facility.  EMS vitals: HR 110 CBG 129 BP 135/98

## 2018-10-28 NOTE — ED Notes (Signed)
ED TO INPATIENT HANDOFF REPORT  ED Nurse Name and Phone #: Osborne Casco, RN  S Name/Age/Gender Amanda [name] 73 y.o. female Room/Bed: 038C/038C  Code Status   Code Status: Prior  Home/SNF/Other Skilled nursing facility Patient oriented to: self Is this baseline? Yes   Triage Complete: Triage complete  Chief Complaint Fever  Triage Note Pt arrives with [facility] from [facility] skilled nursing facility c/o fever of 101 per facility (unknown if oral or rectal). Per EMS, her oral temp en route was 100.6. Pt's altered mental status is the pt's baseline per the facility. Pt is non verbal and can open eyes to pain. Pt has hx of stroke and resulting contractures per facility.  EMS vitals: HR 110 CBG 129 BP 135/98    Allergies Allergies  Allergen Reactions  . Pollen Extract Other (See Comments)    rhinitis    Level of Care/Admitting Diagnosis ED Disposition    ED Disposition Condition Comment   Admit  Hospital Area: [facility] [[id number]]  Level of Care: Med-Surg [16]  I expect the patient will be discharged within 24 hours: Yes  LOW acuity---Tx typically complete <24 hrs---ACUTE conditions typically can be evaluated <24 hours---LABS likely to return to acceptable levels <24 hours---IS near functional baseline---EXPECTED to return to current living arrangement---NOT newly hypoxic: Meets criteria for 5C-Observation unit  Diagnosis: SIRS (systemic inflammatory response syndrome) [facility]) [[id number]]  Admitting Physician: Chiquita Loth  Attending Physician: Randol Kern, DAWOOD S [[id number]]  PT Class (Do Not Modify): Observation [104]  PT Acc Code (Do Not Modify): Observation [10022]       B Medical/Surgery History Past Medical History:  Diagnosis Date  . Dementia (HCC)    "alzheimer's" (05/18/2012)  . Fall 05/17/2012  . Fracture of medial wall of orbit 05/17/2012  . Gout    "? feet" (05/18/2012); uric acid 9.4 on 08/06/16 with R wrist pain; 7.1 on 08/15/16 on  Allopurinol  . Hypertension   . Incontinence of urine   . Stroke [facility]) ~ 2003   "slight memory loss" (05/18/2012)  . Wrist fracture, bilateral 05/17/2012   "fell down steps" (05/18/2012)   Past Surgical History:  Procedure Laterality Date  . fractured tooth     01/06/17 Dr Ocie Doyne DMD extracted tooth  #4   . VAGINAL HYSTERECTOMY       A IV Location/Drains/Wounds Patient Lines/Drains/Airways Status   Active Line/Drains/Airways    Name:   Placement date:   Placement time:   Site:   Days:   Peripheral IV 10/28/18 Right Forearm   10/28/18    1043    Forearm   less than 1   External Urinary Catheter   11/10/17    0215    -   352   Pressure Injury 08/17/17 Stage II -  Partial thickness loss of dermis presenting as a shallow open ulcer with a red, pink wound bed without slough. stage 2 pressure injury surrounded by pink dry scar tissue   08/17/17    2200     437          Intake/Output Last 24 hours  Intake/Output Summary (Last 24 hours) at 10/28/2018 1531 Last data filed at 10/28/2018 1217 Gross per 24 hour  Intake -  Output 50 ml  Net -50 ml    Labs/Imaging Results for orders placed or performed during the hospital encounter of 10/28/18 (from the past 48 hour(s))  Lactic acid, plasma     Status: None   Collection Time:  10/28/18 10:20 AM  Result Value Ref Range   Lactic Acid, Venous 1.4 0.5 - 1.9 mmol/L    Comment: Performed at [facility], [address] [place], [place] [address]  Comprehensive metabolic panel     Status: Abnormal   Collection Time: 10/28/18 10:20 AM  Result Value Ref Range   Sodium 147 (H) 135 - 145 mmol/L   Potassium 4.0 3.5 - 5.1 mmol/L   Chloride 112 (H) 98 - 111 mmol/L   CO2 24 22 - 32 mmol/L   Glucose, Bld 133 (H) 70 - 99 mg/dL   BUN 35 (H) 8 - 23 mg/dL   Creatinine, Ser 6.042.36 (H) 0.44 - 1.00 mg/dL   Calcium 54.010.2 8.9 - 98.110.3 mg/dL   Total Protein 8.3 (H) 6.5 - 8.1 g/dL   Albumin 3.8 3.5 - 5.0 g/dL   AST 24 15 - 41 U/L   ALT 22 0 - 44  U/L   Alkaline Phosphatase 75 38 - 126 U/L   Total Bilirubin 0.8 0.3 - 1.2 mg/dL   GFR calc non Af Amer 19 (L) >60 mL/min   GFR calc Af Amer 23 (L) >60 mL/min   Anion gap 11 5 - 15    Comment: Performed at [facility], [address] [place], [place] [address]  CBC WITH DIFFERENTIAL     Status: Abnormal   Collection Time: 10/28/18 10:20 AM  Result Value Ref Range   WBC 13.4 (H) 4.0 - 10.5 K/uL   RBC 4.96 3.87 - 5.11 MIL/uL   Hemoglobin 14.4 12.0 - 15.0 g/dL   HCT 82.947.2 (H) 56.236.0 - 13.046.0 %   MCV 95.2 80.0 - 100.0 fL   MCH 29.0 26.0 - 34.0 pg   MCHC 30.5 30.0 - 36.0 g/dL   RDW 86.513.3 78.411.5 - 69.615.5 %   Platelets 260 150 - 400 K/uL   nRBC 0.0 0.0 - 0.2 %   Neutrophils Relative % 81 %   Neutro Abs 10.8 (H) 1.7 - 7.7 K/uL   Lymphocytes Relative 12 %   Lymphs Abs 1.6 0.7 - 4.0 K/uL   Monocytes Relative 7 %   Monocytes Absolute 0.9 0.1 - 1.0 K/uL   Eosinophils Relative 0 %   Eosinophils Absolute 0.0 0.0 - 0.5 K/uL   Basophils Relative 0 %   Basophils Absolute 0.0 0.0 - 0.1 K/uL   Immature Granulocytes 0 %   Abs Immature Granulocytes 0.06 0.00 - 0.07 K/uL    Comment: Performed at [facility], [address] [place], [place] [address]  Urinalysis, Routine w reflex microscopic     Status: Abnormal   Collection Time: 10/28/18 12:15 PM  Result Value Ref Range   Color, Urine AMBER (A) YELLOW    Comment: BIOCHEMICALS MAY BE AFFECTED BY COLOR   APPearance CLOUDY (A) CLEAR   Specific Gravity, Urine 1.029 1.005 - 1.030   pH 5.0 5.0 - 8.0   Glucose, UA NEGATIVE NEGATIVE mg/dL   Hgb urine dipstick NEGATIVE NEGATIVE   Bilirubin Urine NEGATIVE NEGATIVE   Ketones, ur 20 (A) NEGATIVE mg/dL   Protein, ur 30 (A) NEGATIVE mg/dL   Nitrite NEGATIVE NEGATIVE   Leukocytes,Ua NEGATIVE NEGATIVE   RBC / HPF 0-5 0 - 5 RBC/hpf   WBC, UA 0-5 0 - 5 WBC/hpf   Bacteria, UA RARE (A) NONE SEEN   Squamous Epithelial / LPF 0-5 0 - 5   Mucus PRESENT    Hyaline Casts, UA PRESENT  Comment:  Performed at [facility], [address] [place], [place] [address]   Dg Chest Port 1 View  Result Date: 10/28/2018 CLINICAL DATA:  Fever.  Altered mental status. EXAM: PORTABLE CHEST 1 VIEW COMPARISON:  11/09/2017. FINDINGS: Prior CABG. Cardiomegaly. No pulmonary venous congestion. No focal infiltrate. No pleural effusion or pneumothorax. No acute bony abnormality. IMPRESSION: 1.  Prior CABG.  Cardiomegaly.  No pulmonary venous congestion. 2.  No focal infiltrate. Electronically Signed   By: Maisie Fus  Register   On: 10/28/2018 11:43    Pending Labs Unresulted Labs (From admission, onward)    Start     Ordered   10/28/18 1439  RSV(respiratory syncytial virus) ab, bld  Once,   R     10/28/18 1438   10/28/18 1439  Influenza panel by PCR (type A & B)  Once,   R     10/28/18 1438   10/28/18 1205  Urine culture  ONCE - STAT,   STAT     10/28/18 1204   10/28/18 1003  Blood Culture (routine x 2)  BLOOD CULTURE X 2,   STAT     10/28/18 1003   Signed and Held  CBC  (heparin)  Once,   R    Comments:  Baseline for heparin therapy IF NOT ALREADY DRAWN.  Notify MD if PLT < 100 K.    Signed and Held   Signed and Held  Creatinine, serum  (heparin)  Once,   R    Comments:  Baseline for heparin therapy IF NOT ALREADY DRAWN.    Signed and Held   Signed and Held  Basic metabolic panel  Tomorrow morning,   R     Signed and Held   Signed and Held  CBC  Tomorrow morning,   R     Signed and Held          Vitals/Pain Today's Vitals   10/28/18 1215 10/28/18 1245 10/28/18 1345 10/28/18 1445  BP: (!) 147/103 (!) 160/113 (!) 158/109 (!) 162/92  Pulse: 92 85 92 86  Resp: 20 17 17 15   Temp:      TempSrc:      SpO2: 94% 100% 96% 97%  Weight:      Height:        Isolation Precautions No active isolations  Medications Medications  vancomycin variable dose per unstable renal function (pharmacist dosing) (has no administration in time range)  ceFEPIme (MAXIPIME) 1 g in sodium chloride 0.9 %  100 mL IVPB (1 g Intravenous New Bag/Given 10/28/18 1501)  0.9 %  sodium chloride infusion (500 mLs Intravenous New Bag/Given 10/28/18 1344)  0.45 % sodium chloride infusion (has no administration in time range)  sodium chloride 0.9 % bolus 1,000 mL (0 mLs Intravenous Stopped 10/28/18 1205)  sodium chloride 0.9 % bolus 1,000 mL (0 mLs Intravenous Stopped 10/28/18 1327)  acetaminophen (TYLENOL) tablet 1,000 mg (1,000 mg Oral Given 10/28/18 1311)  vancomycin (VANCOCIN) IVPB 1000 mg/200 mL premix (0 mg Intravenous Stopped 10/28/18 1454)    Mobility non-ambulatory High fall risk   Focused Assessments Cardiac Assessment Handoff:  Cardiac Rhythm: Sinus tachycardia No results found for: CKTOTAL, CKMB, CKMBINDEX, TROPONINI No results found for: DDIMER Does the Patient currently have chest pain? No      R Recommendations: See Admitting Provider Note  Report given to:   Additional Notes:

## 2018-10-28 NOTE — ED Notes (Signed)
Xray tech at pt bedside. 

## 2018-10-28 NOTE — ED Notes (Signed)
This RN consulted with provider regarding antibiotics; ED resident stated he is waiting on CBC results prior to start antibiotics.

## 2018-10-28 NOTE — Progress Notes (Signed)
Jeffie GWYNNE MUSCARELLA [id number]  Code Status: FULL  Admission Data: 10/28/2018  Attending Provider: Elgergawy  QVZ:DGLOVF, Titus Dubin, MD  Consults/ Treatment Team:   Adela Glimpse Mahlke is a 73 y.o. female patient admitted from ED, lethargic and unable to follow commands but responds to pain. No acute distress noted. VSS - Blood pressure (!) 154/103, pulse 84, temperature 99.5 F (37.5 C), temperature source Axillary, resp. rate 15, height 5\' 1"  (1.549 m), weight 45.5 kg, SpO2 97 %. Information packet given to patient/family. Call light within reach. Skin clean and dry. MASD noted to groin and bottom and stage 1 on bottom. Foam dressing in place. On droplet precautions to rule out flu. ?  Will cont to eval and treat per MD orders.  Jon Gills, RN  10/28/2018

## 2018-10-28 NOTE — H&P (Signed)
TRH H&P   Patient Demographics:    Amanda [name], is a 73 y.o. female  MRN: [id number]   DOB - 08-Jun-1946  Admit Date - 10/28/2018  Outpatient Primary MD for the patient is Pecola Lawless, MD  Referring MD/NP/PA: Dr Nedra Hai  Patient coming from: [facility]  Chief Complaint  Patient presents with  . Fever      HPI:    Amanda [name]  is a 73 y.o. female,  with medical history significant of stroke (non-verbal), hypertension, dementia, gout, s/p of aortic aneurysm repair, at baseline nonverbal does not follow command, CKD and hypertension, patient was sent from [facility] for one episode of nonbloody vomiting, nonbilious which did happen yesterday, no desaturation or increased work of breathing, but this morning she was noticed to be febrile at 101 Fahrenheit, more lethargic and obtunded than his baseline, so she was sent to ED for further evaluation, husband at bedside, reports he has not seen her for last week secondary to nursing home visitation restrictions, reports she is currently back at baseline. - in ED she was febrile at 101.4, chest x-ray with no acute finding, has negative urine analysis, abdominal exam is benign, creatinine elevated at 2.36, she had leukocytosis at 13.4, but normal lactic acid, she was started empirically on vancomycin and cefepime and I was called to admit    Review of systems:    Patient with advanced dementia, cannot provide any ROS   With Past History of the following :    Past Medical History:  Diagnosis Date  . Dementia (HCC)    "alzheimer's" (05/18/2012)  . Fall 05/17/2012  . Fracture of medial wall of orbit 05/17/2012  . Gout    "? feet" (05/18/2012); uric acid 9.4 on 08/06/16 with R wrist pain; 7.1 on 08/15/16 on Allopurinol  . Hypertension   . Incontinence of urine   . Stroke [facility] ~ 2003   "slight memory loss" (05/18/2012)   . Wrist fracture, bilateral 05/17/2012   "fell down steps" (05/18/2012)      Past Surgical History:  Procedure Laterality Date  . fractured tooth     01/06/17 Dr Ocie Doyne DMD extracted tooth  #4   . VAGINAL HYSTERECTOMY        Social History:     Social History   Tobacco Use  . Smoking status: Former Smoker    Types: Cigarettes  . Smokeless tobacco: Never Used  . Tobacco comment: Staff at [facility] reports that patient no longer smokes.   Substance Use Topics  . Alcohol use: No    Alcohol/week: 0.0 standard drinks    Comment: Staff at [facility] reports that patient no longer drinks.      Lives - [facility]  Mobility -bedbound/wheelchair dependent     Family History :     Family History  Problem Relation Age of Onset  . Dementia Mother   .  Dementia Father   . Dementia Sister   . Dementia Brother      Home Medications:   Prior to Admission medications   Medication Sig Start Date End Date Taking? Authorizing Provider  barrier cream (NON-SPECIFIED) CREA Apply 1 application topically 3 (three) times daily. Apply to inner left buttock each shift   Yes [provider]  bisacodyl (BISACODYL LAXATIVE) 10 MG suppository Place 10 mg rectally daily as needed for mild constipation.    Yes [provider]  Calcium Carb-Cholecalciferol ([employer] OYSTER SHELL CALCIUM+VIT D PO) Take 500 mg by mouth daily.   Yes [provider]  cloNIDine (CATAPRES) 0.1 MG tablet Take 0.1 mg by mouth 2 (two) times daily.    Yes [provider]  labetalol (NORMODYNE) 300 MG tablet Take 300 mg by mouth 2 (two) times daily.   Yes [provider]  magnesium hydroxide (MILK OF MAGNESIA) 400 MG/5ML suspension Take 30 mLs by mouth daily as needed for mild constipation.    Yes [provider]  Multiple Vitamins-Minerals (MULTIVITAMIN WITH MINERALS) tablet Take 1 tablet by mouth daily.   Yes [provider]  Nutritional Supplements (NUTRITIONAL  SUPPLEMENT PO) Take 1 each by mouth 2 (two) times daily. Magic Cup   Yes [provider]  senna-docusate (SENOKOT-S) 8.6-50 MG tablet Take 1-2 tablets by mouth See admin instructions. Take 1 tablet QAM and 2 tablets QPM   Yes [provider]  Sodium Phosphates (RA SALINE ENEMA RE) Place 1 Container rectally daily as needed (constipation).    Yes [provider]     Allergies:     Allergies  Allergen Reactions  . Pollen Extract Other (See Comments)    rhinitis     Physical Exam:   Vitals  Blood pressure (!) 158/109, pulse 92, temperature (!) 101.4 F (38.6 C), temperature source Rectal, resp. rate 17, height 5\' 1"  (1.549 m), weight 50.9 kg, SpO2 96 %.   1. General appears calm, comfortable, laying in bed in no apparent distress  2.  SHEENT is nonverbal, does not follow commands, does not answer questions, this is her baseline per husband.  3.  Peers to be moving all extremities with no significant deficits, but neurologic exam is difficult to perform as she does not follow any commands  4. Ears and Eyes appear Normal, Conjunctivae clear, PERRLA.  Dry oral Mucosa.  5. Supple Neck, No JVD, No cervical lymphadenopathy appriciated, No Carotid Bruits.  6. Symmetrical Chest wall movement, Good air movement bilaterally, CTAB.  7. RRR, No Gallops, Rubs or Murmurs, No Parasternal Heave.  8. Positive Bowel Sounds, Abdomen Soft, No tenderness, No organomegaly appriciated,No rebound -guarding or rigidity.  9.  No Cyanosis, Normal Skin Turgor, No Skin Rash or Bruise.  10. Good muscle tone,  joints appear normal , no effusions,   11. No Palpable Lymph Nodes in Neck or Axillae     Data Review:    CBC Recent Labs  Lab 10/28/18 1020  WBC 13.4*  HGB 14.4  HCT 47.2*  PLT 260  MCV 95.2  MCH 29.0  MCHC 30.5  RDW 13.3  LYMPHSABS 1.6  MONOABS 0.9  EOSABS 0.0  BASOSABS 0.0    ------------------------------------------------------------------------------------------------------------------  Chemistries  Recent Labs  Lab 10/28/18 1020  NA 147*  K 4.0  CL 112*  CO2 24  GLUCOSE 133*  BUN 35*  CREATININE 2.36*  CALCIUM 10.2  AST 24  ALT 22  ALKPHOS 75  BILITOT 0.8   ------------------------------------------------------------------------------------------------------------------ estimated  creatinine clearance is 15.5 mL/min (A) (by C-G formula based on SCr of 2.36 mg/dL (H)). ------------------------------------------------------------------------------------------------------------------ No results for input(s): TSH, T4TOTAL, T3FREE, THYROIDAB in the last 72 hours.  Invalid input(s): FREET3  Coagulation profile No results for input(s): INR, PROTIME in the last 168 hours. ------------------------------------------------------------------------------------------------------------------- No results for input(s): DDIMER in the last 72 hours. -------------------------------------------------------------------------------------------------------------------  Cardiac Enzymes No results for input(s): CKMB, TROPONINI, MYOGLOBIN in the last 168 hours.  Invalid input(s): CK ------------------------------------------------------------------------------------------------------------------ No results found for: BNP   ---------------------------------------------------------------------------------------------------------------  Urinalysis    Component Value Date/Time   COLORURINE AMBER (A) 10/28/2018 1215   APPEARANCEUR CLOUDY (A) 10/28/2018 1215   LABSPEC 1.029 10/28/2018 1215   PHURINE 5.0 10/28/2018 1215   GLUCOSEU NEGATIVE 10/28/2018 1215   HGBUR NEGATIVE 10/28/2018 1215   BILIRUBINUR NEGATIVE 10/28/2018 1215   KETONESUR 20 (A) 10/28/2018 1215   PROTEINUR 30 (A) 10/28/2018 1215   UROBILINOGEN 0.2 05/17/2012 1424   NITRITE NEGATIVE 10/28/2018 1215    LEUKOCYTESUR NEGATIVE 10/28/2018 1215    ----------------------------------------------------------------------------------------------------------------   Imaging Results:    Dg Chest Port 1 View  Result Date: 10/28/2018 CLINICAL DATA:  Fever.  Altered mental status. EXAM: PORTABLE CHEST 1 VIEW COMPARISON:  11/09/2017. FINDINGS: Prior CABG. Cardiomegaly. No pulmonary venous congestion. No focal infiltrate. No pleural effusion or pneumothorax. No acute bony abnormality. IMPRESSION: 1.  Prior CABG.  Cardiomegaly.  No pulmonary venous congestion. 2.  No focal infiltrate. Electronically Signed   By: Maisie Fus  Register   On: 10/28/2018 11:43     Assessment & Plan:    Active Problems:   Essential hypertension, benign   Dementia with behavioral disturbance (HCC)   Anemia of chronic disease   History of stroke   Dehydration with hypernatremia   Alzheimer disease (HCC)   SIRS (systemic inflammatory response syndrome) (HCC)   SIRS -Patient presents with fever, leukocytosis, altered mental status, acute renal failure, no source of infection currently, negative UA, chest x-ray is clean, -  will continue with vancomycin and cefepime, follow blood cultures.  - husband denies patient having any cough or shortness of breath, will obtain flu panel and RSV. -He did present with vomiting, but her abdominal exam is benign.  Hydration/hypernatremia/acute renal failure -in Setting of volume depletion and decreased oral intake, will start on half-normal saline, resume patient on a diet once cleared by SLP, repeat sodium level in a.m..  Acute metabolic encephalopathy -Per SNF patient was more lethargic than baseline, on my evaluation husband at bedside, reports she is back at baseline, open eyes, nonverbal at baseline, does not follow any commands.  AKI on CKD stage III -Baseline creatinine around 1.5, currently 0.3 on admission, hold nephrotoxic medication and continue with IV fluids  Hypertension  -Pressure elevated, continue with home meds  Sacral pressure ulcer, sacral stage II -Does not appear to be infected  DVT Prophylaxis Heparin - SCDs   AM Labs Ordered, also please review Full Orders  Family Communication: Admission, patients condition and plan of care including tests being ordered have been discussed with the husband who indicate understanding and agree with the plan and Code Status.  Code Status Full, was confirmed by the husband  Likely DC to  SNF  Condition GUARDED    Consults called: None  Admission status: Observation, as possibly can be discharged in 24 hours if her renal function corrects and septic work-up is negative.  Time spent in minutes : 55 minutes   Huey Bienenstock M.D on 10/28/2018 at 2:22 PM  Between  7am to 7pm - Pager - [phone]. After 7pm go to [email or web address] - password [facility]  [facility] - Office  [phone]

## 2018-10-28 NOTE — ED Provider Notes (Signed)
[facility] EMERGENCY DEPARTMENT Provider Note   CSN: [id number] Arrival date & time: 10/28/18  2446    History   Chief Complaint Chief Complaint  Patient presents with  . Fever    HPI Amanda [name] is a 73 y.o. female w/ PMH of dementia, CVA, CKD3 and HTn presenting with fever. No family at bedside. She is non-verbal at baseline and is unable to answer questions. Spoke with nursing staff at [facility] who states that she was in her usual state of health when she had 1 episode of non-bloody, non-bilious vomiting yesterday. She did not have any desaturation event at the time. This morning she was noted to be febrile with temp >101F and transport was to bring to [facility].  Per chart review, Amanda [name] is non-verbal, non-ambulatory at baseline. Her PCP mentions her mentation is irreversible but her husband is her legal guardian and would like her to be full code. Prior hospitalization shows recommendation for 'Do Not Hospitalize' status but husband would like to pursue full scope of care, including aggressive interventions.  Attempted to reach out to husband for further clarification on goals of care and history but he did not pick up. Voice mail left with return number.  Her husband Amanda [name]) arrived at bedside. Had long discussion about goals of care and long-term prognosis. Discussed risks of hospitalization especially with recent concerns for coronovirus and possibility of earlier demise. Mr.Moncrieffe states he is concerned about quality of care she receives at her facility especially due to inability to observe her care due to recent visitation restrictions. Also discussed code status in detail with difference between full and DNR. Mr.Delval expressed understanding and continues to pursue full code for Amanda [name].   Past Medical History:  Diagnosis Date  . Dementia (HCC)    "alzheimer's" (05/18/2012)  . Fall 05/17/2012  . Fracture of  medial wall of orbit 05/17/2012  . Gout    "? feet" (05/18/2012); uric acid 9.4 on 08/06/16 with R wrist pain; 7.1 on 08/15/16 on Allopurinol  . Hypertension   . Incontinence of urine   . Stroke [facility]) ~ 2003   "slight memory loss" (05/18/2012)  . Wrist fracture, bilateral 05/17/2012   "fell down steps" (05/18/2012)    Patient Active Problem List   Diagnosis Date Noted  . SIRS (systemic inflammatory response syndrome) (HCC) 11/13/2017  . Malnutrition of moderate degree 11/11/2017  . Hypernatremia 11/10/2017  . Acute metabolic encephalopathy 11/10/2017  . Alzheimer disease (HCC)   . Hospice care   . Aspiration pneumonia (HCC)   . Palliative care encounter   . Pressure injury of skin 08/12/2017  . History of stroke 08/11/2017  . Acute confusional state 08/11/2017  . Dehydration with hypernatremia 08/11/2017  . Chronic constipation 09/17/2016  . History of gout 08/06/2016  . Anemia of chronic disease 10/08/2014  . Allergic rhinitis 11/24/2013  . Hyperlipidemia 02/27/2013  . Anxiety state 12/12/2012  . Vitamin D deficiency 12/12/2012  . Acute renal failure superimposed on stage 3 chronic kidney disease (HCC) 05/17/2012  . Essential hypertension, benign   . Dementia with behavioral disturbance [facility])    Past Surgical History:  Procedure Laterality Date  . fractured tooth     01/06/17 Dr Ocie Doyne DMD extracted tooth  #4   . VAGINAL HYSTERECTOMY       OB History   No obstetric history on file.    Home Medications    Prior to Admission medications   Medication Sig Start Date End  Date Taking? Authorizing Provider  barrier cream (NON-SPECIFIED) CREA Apply 1 application topically 3 (three) times daily. Apply to inner left buttock each shift   Yes [provider]  bisacodyl (BISACODYL LAXATIVE) 10 MG suppository Place 10 mg rectally daily as needed for mild constipation.    Yes [provider]  Calcium Carb-Cholecalciferol ([employer] OYSTER SHELL CALCIUM+VIT D PO) Take  500 mg by mouth daily.   Yes [provider]  cloNIDine (CATAPRES) 0.1 MG tablet Take 0.1 mg by mouth 2 (two) times daily.    Yes [provider]  labetalol (NORMODYNE) 300 MG tablet Take 300 mg by mouth 2 (two) times daily.   Yes [provider]  magnesium hydroxide (MILK OF MAGNESIA) 400 MG/5ML suspension Take 30 mLs by mouth daily as needed for mild constipation.    Yes [provider]  Multiple Vitamins-Minerals (MULTIVITAMIN WITH MINERALS) tablet Take 1 tablet by mouth daily.   Yes [provider]  Nutritional Supplements (NUTRITIONAL SUPPLEMENT PO) Take 1 each by mouth 2 (two) times daily. Magic Cup   Yes [provider]  senna-docusate (SENOKOT-S) 8.6-50 MG tablet Take 1-2 tablets by mouth See admin instructions. Take 1 tablet QAM and 2 tablets QPM   Yes [provider]  Sodium Phosphates (RA SALINE ENEMA RE) Place 1 Container rectally daily as needed (constipation).    Yes [provider]    Family History Family History  Problem Relation Age of Onset  . Dementia Mother   . Dementia Father   . Dementia Sister   . Dementia Brother     Social History Social History   Tobacco Use  . Smoking status: Former Smoker    Types: Cigarettes  . Smokeless tobacco: Never Used  . Tobacco comment: Staff at [facility] reports that patient no longer smokes.   Substance Use Topics  . Alcohol use: No    Alcohol/week: 0.0 standard drinks    Comment: Staff at [facility] reports that patient no longer drinks.   . Drug use: No    Types: Marijuana   Allergies   Pollen extract  Review of Systems Review of Systems  Unable to perform ROS: Dementia   Physical Exam Updated Vital Signs BP (!) 158/109   Pulse 92   Temp (!) 101.4 F (38.6 C) (Rectal)   Resp 17   Ht  (1.549 m)   Wt 50.9 kg   SpO2 96%   BMI 21.20 kg/m   Physical Exam Constitutional:      Appearance: She is ill-appearing.     Comments: Somnolent   HENT:     Head: Atraumatic.     Comments: Temporal wasting    Nose: Nose normal.     Mouth/Throat:     Mouth: Mucous membranes are dry.     Pharynx: Oropharynx is clear.  Eyes:     Conjunctiva/sclera: Conjunctivae normal.  Neck:     Musculoskeletal: No neck rigidity.  Cardiovascular:     Rate and Rhythm: Regular rhythm. Tachycardia present.     Pulses: Normal pulses.     Heart sounds: No murmur.  Pulmonary:     Effort: Pulmonary effort is normal.     Breath sounds: Normal breath sounds. No wheezing or rales.  Abdominal:     General: Abdomen is flat. Bowel sounds are normal.     Tenderness: There is no abdominal tenderness. There is no guarding.  Skin:    General: Skin is warm and dry.     Findings:  Erythema (stage 1 sacral pressure ulcer with intact skin without ulcer, drainage.) present.     Comments: Poor skin turgor  Neurological:     Mental Status: Mental status is at baseline.     Comments: GCS 8 with response to pain and incomprehensible sounds.    ED Treatments / Results  Labs (all labs ordered are listed, but only abnormal results are displayed) Labs Reviewed  COMPREHENSIVE METABOLIC PANEL - Abnormal; Notable for the following components:      Result Value   Sodium 147 (*)    Chloride 112 (*)    Glucose, Bld 133 (*)    BUN 35 (*)    Creatinine, Ser 2.36 (*)    Total Protein 8.3 (*)    GFR calc non Af Amer 19 (*)    GFR calc Af Amer 23 (*)    All other components within normal limits  CBC WITH DIFFERENTIAL/PLATELET - Abnormal; Notable for the following components:   WBC 13.4 (*)    HCT 47.2 (*)    Neutro Abs 10.8 (*)    All other components within normal limits  URINALYSIS, ROUTINE W REFLEX MICROSCOPIC - Abnormal; Notable for the following components:   Color, Urine AMBER (*)    APPearance CLOUDY (*)    Ketones, ur 20 (*)    Protein, ur 30 (*)    Bacteria, UA RARE (*)    All other components within normal limits  CULTURE, BLOOD (ROUTINE X 2)  CULTURE,  BLOOD (ROUTINE X 2)  URINE CULTURE  LACTIC ACID, PLASMA  RSV(RESPIRATORY SYNCYTIAL VIRUS) AB, BLOOD  INFLUENZA PANEL BY PCR (TYPE A & B)    EKG EKG Interpretation  Date/Time:  Friday October 28 2018 10:26:26 EDT Ventricular Rate:  108 PR Interval:    QRS Duration: 78 QT Interval:  360 QTC Calculation: 483 R Axis:     Text Interpretation:  Sinus tachycardia Probable left atrial enlargement Abnormal ekg Confirmed by Gerhard Munch [phone]) on 10/28/2018 10:31:03 AM Also confirmed by Gerhard Munch ([id number]), editor Barbette Hair [phone])  on 10/28/2018 10:49:53 AM   Radiology Dg Chest Port 1 View  Result Date: 10/28/2018 CLINICAL DATA:  Fever.  Altered mental status. EXAM: PORTABLE CHEST 1 VIEW COMPARISON:  11/09/2017. FINDINGS: Prior CABG. Cardiomegaly. No pulmonary venous congestion. No focal infiltrate. No pleural effusion or pneumothorax. No acute bony abnormality. IMPRESSION: 1.  Prior CABG.  Cardiomegaly.  No pulmonary venous congestion. 2.  No focal infiltrate. Electronically Signed   By: Maisie Fus  Register   On: 10/28/2018 11:43    Procedures Procedures (including critical care time)  Medications Ordered in ED Medications  vancomycin variable dose per unstable renal function (pharmacist dosing) (has no administration in time range)  ceFEPIme (MAXIPIME) 1 g in sodium chloride 0.9 % 100 mL IVPB (has no administration in time range)  0.9 %  sodium chloride infusion (500 mLs Intravenous New Bag/Given 10/28/18 1344)  0.45 % sodium chloride infusion (has no administration in time range)  sodium chloride 0.9 % bolus 1,000 mL (0 mLs Intravenous Stopped 10/28/18 1205)  sodium chloride 0.9 % bolus 1,000 mL (0 mLs Intravenous Stopped 10/28/18 1327)  acetaminophen (TYLENOL) tablet 1,000 mg (1,000 mg Oral Given 10/28/18 1311)  vancomycin (VANCOCIN) IVPB 1000 mg/200 mL premix (1,000 mg Intravenous New Bag/Given 10/28/18 1346)   Initial Impression / Assessment and Plan / ED Course  I have reviewed  the triage vital signs and the nursing notes.  Pertinent labs & imaging results that were available during  my care of the patient were reviewed by me and considered in my medical decision making (see chart for details).   Amanda [name] is a 73 yo F w/ PMH of Dementia, CVA, CKD3, and HTn presenting with fever. She meets SIRS criteria with fever, tachypnea and tachycardia. Difficult to assess infectious source as she is non-verbal at baseline due to dementia and cva. She has hx of sacral ulcer but skin is intact. She had emesis last night which is concerning for aspiration pneumonia. She may also have a urinary source. Will evaluate as presumed sepsis. CBC, lactate, cmp, ua, chest X-ray, collect blood culture and will give fluid bolus.  Final Clinical Impressions(s) / ED Diagnoses   Final diagnoses:  FUO (fever of unknown origin)   Amanda [name] is a 73 yo F w/ PMH of Dementia, CVA, CKD3, and HTn presenting with fever of unknown etiology. Chest X-ray and UA have been negative and her chronic sacral pressure wound does not look infected. Currently febrile but blood pressure is stable. She has AKI and appears dehydrated. Received 2L bolus in ED. Admit to hospitalist service for work-up fever of unknown origin.  ED Discharge Orders    None       Theotis Barrio, MD 10/28/18 1450    Gerhard Munch, MD 10/29/18 [phone]

## 2018-10-29 DIAGNOSIS — M2450 Contracture, unspecified joint: Secondary | ICD-10-CM | POA: Diagnosis present

## 2018-10-29 DIAGNOSIS — G300 Alzheimer's disease with early onset: Secondary | ICD-10-CM | POA: Diagnosis not present

## 2018-10-29 DIAGNOSIS — F0151 Vascular dementia with behavioral disturbance: Secondary | ICD-10-CM

## 2018-10-29 DIAGNOSIS — R112 Nausea with vomiting, unspecified: Secondary | ICD-10-CM | POA: Diagnosis not present

## 2018-10-29 DIAGNOSIS — K5669 Other partial intestinal obstruction: Secondary | ICD-10-CM | POA: Diagnosis not present

## 2018-10-29 DIAGNOSIS — I129 Hypertensive chronic kidney disease with stage 1 through stage 4 chronic kidney disease, or unspecified chronic kidney disease: Secondary | ICD-10-CM | POA: Diagnosis present

## 2018-10-29 DIAGNOSIS — N179 Acute kidney failure, unspecified: Secondary | ICD-10-CM | POA: Diagnosis present

## 2018-10-29 DIAGNOSIS — I10 Essential (primary) hypertension: Secondary | ICD-10-CM | POA: Diagnosis not present

## 2018-10-29 DIAGNOSIS — R7881 Bacteremia: Secondary | ICD-10-CM

## 2018-10-29 DIAGNOSIS — N183 Chronic kidney disease, stage 3 (moderate): Secondary | ICD-10-CM | POA: Diagnosis present

## 2018-10-29 DIAGNOSIS — G309 Alzheimer's disease, unspecified: Secondary | ICD-10-CM | POA: Diagnosis not present

## 2018-10-29 DIAGNOSIS — Z951 Presence of aortocoronary bypass graft: Secondary | ICD-10-CM | POA: Diagnosis not present

## 2018-10-29 DIAGNOSIS — G9341 Metabolic encephalopathy: Secondary | ICD-10-CM | POA: Diagnosis present

## 2018-10-29 DIAGNOSIS — R0602 Shortness of breath: Secondary | ICD-10-CM | POA: Diagnosis not present

## 2018-10-29 DIAGNOSIS — F0281 Dementia in other diseases classified elsewhere with behavioral disturbance: Secondary | ICD-10-CM | POA: Diagnosis present

## 2018-10-29 DIAGNOSIS — Z931 Gastrostomy status: Secondary | ICD-10-CM | POA: Diagnosis not present

## 2018-10-29 DIAGNOSIS — E860 Dehydration: Secondary | ICD-10-CM | POA: Diagnosis present

## 2018-10-29 DIAGNOSIS — D638 Anemia in other chronic diseases classified elsewhere: Secondary | ICD-10-CM | POA: Diagnosis not present

## 2018-10-29 DIAGNOSIS — K56609 Unspecified intestinal obstruction, unspecified as to partial versus complete obstruction: Secondary | ICD-10-CM | POA: Diagnosis not present

## 2018-10-29 DIAGNOSIS — R05 Cough: Secondary | ICD-10-CM | POA: Diagnosis not present

## 2018-10-29 DIAGNOSIS — R54 Age-related physical debility: Secondary | ICD-10-CM | POA: Diagnosis present

## 2018-10-29 DIAGNOSIS — E876 Hypokalemia: Secondary | ICD-10-CM | POA: Diagnosis present

## 2018-10-29 DIAGNOSIS — L89152 Pressure ulcer of sacral region, stage 2: Secondary | ICD-10-CM | POA: Diagnosis present

## 2018-10-29 DIAGNOSIS — Z515 Encounter for palliative care: Secondary | ICD-10-CM | POA: Diagnosis not present

## 2018-10-29 DIAGNOSIS — F0280 Dementia in other diseases classified elsewhere without behavioral disturbance: Secondary | ICD-10-CM | POA: Diagnosis not present

## 2018-10-29 DIAGNOSIS — E870 Hyperosmolality and hypernatremia: Secondary | ICD-10-CM | POA: Diagnosis not present

## 2018-10-29 DIAGNOSIS — R6510 Systemic inflammatory response syndrome (SIRS) of non-infectious origin without acute organ dysfunction: Secondary | ICD-10-CM | POA: Diagnosis not present

## 2018-10-29 DIAGNOSIS — Z681 Body mass index (BMI) 19 or less, adult: Secondary | ICD-10-CM | POA: Diagnosis not present

## 2018-10-29 DIAGNOSIS — L89102 Pressure ulcer of unspecified part of back, stage 2: Secondary | ICD-10-CM | POA: Diagnosis not present

## 2018-10-29 DIAGNOSIS — J690 Pneumonitis due to inhalation of food and vomit: Secondary | ICD-10-CM | POA: Diagnosis not present

## 2018-10-29 DIAGNOSIS — Z66 Do not resuscitate: Secondary | ICD-10-CM | POA: Diagnosis present

## 2018-10-29 DIAGNOSIS — M109 Gout, unspecified: Secondary | ICD-10-CM | POA: Diagnosis present

## 2018-10-29 DIAGNOSIS — R402432 Glasgow coma scale score 3-8, at arrival to emergency department: Secondary | ICD-10-CM | POA: Diagnosis present

## 2018-10-29 DIAGNOSIS — R6520 Severe sepsis without septic shock: Secondary | ICD-10-CM | POA: Diagnosis present

## 2018-10-29 DIAGNOSIS — A419 Sepsis, unspecified organism: Secondary | ICD-10-CM | POA: Diagnosis not present

## 2018-10-29 DIAGNOSIS — R509 Fever, unspecified: Secondary | ICD-10-CM | POA: Diagnosis present

## 2018-10-29 DIAGNOSIS — J9601 Acute respiratory failure with hypoxia: Secondary | ICD-10-CM | POA: Diagnosis present

## 2018-10-29 DIAGNOSIS — Z7189 Other specified counseling: Secondary | ICD-10-CM | POA: Diagnosis not present

## 2018-10-29 LAB — BASIC METABOLIC PANEL
Anion gap: 12 (ref 5–15)
BUN: 27 mg/dL — ABNORMAL HIGH (ref 8–23)
CHLORIDE: 114 mmol/L — AB (ref 98–111)
CO2: 22 mmol/L (ref 22–32)
Calcium: 9.5 mg/dL (ref 8.9–10.3)
Creatinine, Ser: 1.56 mg/dL — ABNORMAL HIGH (ref 0.44–1.00)
GFR calc Af Amer: 37 mL/min — ABNORMAL LOW (ref >60)
GFR calc non Af Amer: 32 mL/min — ABNORMAL LOW (ref >60)
Glucose, Bld: 118 mg/dL — ABNORMAL HIGH (ref 70–99)
Potassium: 3.6 mmol/L (ref 3.5–5.1)
SODIUM: 148 mmol/L — AB (ref 135–145)

## 2018-10-29 LAB — CBC
HCT: 43.9 % (ref 36.0–46.0)
Hemoglobin: 13.8 g/dL (ref 12.0–15.0)
MCH: 29.6 pg (ref 26.0–34.0)
MCHC: 31.4 g/dL (ref 30.0–36.0)
MCV: 94.2 fL (ref 80.0–100.0)
Platelets: 209 10*3/uL (ref 150–400)
RBC: 4.66 MIL/uL (ref 3.87–5.11)
RDW: 13.5 % (ref 11.5–15.5)
WBC: 13.4 10*3/uL — ABNORMAL HIGH (ref 4.0–10.5)
nRBC: 0 % (ref 0.0–0.2)

## 2018-10-29 LAB — BLOOD CULTURE ID PANEL (REFLEXED)
Acinetobacter baumannii: NOT DETECTED
CANDIDA GLABRATA: NOT DETECTED
Candida albicans: NOT DETECTED
Candida krusei: NOT DETECTED
Candida parapsilosis: NOT DETECTED
Candida tropicalis: NOT DETECTED
ENTEROCOCCUS SPECIES: NOT DETECTED
ESCHERICHIA COLI: NOT DETECTED
Enterobacter cloacae complex: NOT DETECTED
Enterobacteriaceae species: NOT DETECTED
Haemophilus influenzae: NOT DETECTED
Klebsiella oxytoca: NOT DETECTED
Klebsiella pneumoniae: NOT DETECTED
Listeria monocytogenes: NOT DETECTED
Methicillin resistance: NOT DETECTED
Neisseria meningitidis: NOT DETECTED
Proteus species: NOT DETECTED
Pseudomonas aeruginosa: NOT DETECTED
Serratia marcescens: NOT DETECTED
Staphylococcus aureus (BCID): NOT DETECTED
Staphylococcus species: DETECTED — AB
Streptococcus agalactiae: NOT DETECTED
Streptococcus pneumoniae: NOT DETECTED
Streptococcus pyogenes: NOT DETECTED
Streptococcus species: NOT DETECTED

## 2018-10-29 LAB — URINE CULTURE: Culture: <10000 — AB

## 2018-10-29 MED ORDER — CEFAZOLIN SODIUM-DEXTROSE 2-4 GM/100ML-% IV SOLN
2.0000 g | Freq: Two times a day (BID) | INTRAVENOUS | Status: DC
  Administered 2018-10-29 – 2018-10-30 (×3): 2 g via INTRAVENOUS
  Filled 2018-10-29 (×3): qty 100

## 2018-10-29 MED ORDER — HYDRALAZINE HCL 20 MG/ML IJ SOLN
5.0000 mg | Freq: Once | INTRAMUSCULAR | Status: AC
  Administered 2018-10-29: 5 mg via INTRAVENOUS
  Filled 2018-10-29: qty 1

## 2018-10-29 NOTE — Progress Notes (Signed)
Pt. BP running high.  Called on call and got order for Labetalol IV and Hydralazine IV. She also had some PO BP meds.  Could not get her to take her meds by mouth.  I attempted to crush and put in applesauce.  Pt would not comply and eat the applesauce.  Will continue to monitor BP.

## 2018-10-29 NOTE — Progress Notes (Signed)
BP still running high.  Paged on call again and got another order for hydralazine 5mg  IV.  Given.  Will continue to monitor BP.

## 2018-10-29 NOTE — Progress Notes (Addendum)
PROGRESS NOTE        PATIENT DETAILS Name: Amanda [name] Age: 73 y.o. Sex: female Date of Birth: August 23, 1945 Admit Date: 10/28/2018 Admitting Physician Starleen Arms, MD WUJ:[id number], Titus Dubin, MD  Brief Narrative: Patient is a 73 y.o. female with history of CVA, dementia, s/p aortic aneurysm repair-Per H&P-nonverbal at baseline-resident of SNF-presented to the ED for evaluation of fever, vomiting-found to have MSSA bacteremia.  See below for further details.  Subjective: Does not talk-sometimes tracks my movements.  No family at bedside.  However lying comfortably in bed.  Assessment/Plan: Sepsis secondary to MSSA bacteremia: Initially on vancomycin/cefepime-have switched over to Ancef.  Repeat blood cultures, await TTE.    Note-no respiratory symptoms-influenza PCR/RVP negative.  Chest x-ray without pneumonia.  Has clear-cut source of infection with MSSA bacteremia.  AKI on CKD stage III: AKI hemodynamically mediated-improving with IV fluids and other supportive care.  Avoid nephrotoxic agents.  Acute metabolic encephalopathy: Secondary to sepsis and MSSA bacteremia along with AKI.  No family at bedside-not sure if she is back to baseline.  Mild hypernatremia: Change to half-normal saline-recheck electrolytes tomorrow  Hypertension: Controlled-continue with clonidine, labetalol,  Stage II sacral ulcer: Present prior to [facility] care evaluation.  Dementia/CVA (nonverbal): resident of SNF-suspect she is at baseline-await arrival of family members.  DVT Prophylaxis: Prophylactic Heparin  Code Status: Full code  Family Communication: None at bedside  Addendum:spoke with spouse over the phone.  Disposition Plan: Remain inpatient- SNF on discharge  Antimicrobial agents: Anti-infectives (From admission, onward)   Start     Dose/Rate Route Frequency Ordered Stop   10/29/18 1000  ceFAZolin (ANCEF) IVPB 2g/100 mL premix     2 g  200 mL/hr over 30 Minutes Intravenous Every 12 hours 10/29/18 0911     10/28/18 1330  cefTRIAXone (ROCEPHIN) 2 g in sodium chloride 0.9 % 100 mL IVPB  Status:  Discontinued     2 g 200 mL/hr over 30 Minutes Intravenous  Once 10/28/18 1316 10/28/18 1317   10/28/18 1330  vancomycin (VANCOCIN) IVPB 1000 mg/200 mL premix     1,000 mg 200 mL/hr over 60 Minutes Intravenous  Once 10/28/18 1318 10/28/18 1454   10/28/18 1330  ceFEPIme (MAXIPIME) 1 g in sodium chloride 0.9 % 100 mL IVPB  Status:  Discontinued     1 g 200 mL/hr over 30 Minutes Intravenous Every 24 hours 10/28/18 1326 10/29/18 0910   10/28/18 1318  vancomycin variable dose per unstable renal function (pharmacist dosing)  Status:  Discontinued      Does not apply See admin instructions 10/28/18 1318 10/29/18 0911      Procedures: None  CONSULTS:  None  Time spent: 25- minutes-Greater than 50% of this time was spent in counseling, explanation of diagnosis, planning of further management, and coordination of care.  MEDICATIONS: Scheduled Meds: . calcium-vitamin D   Oral Daily  . cloNIDine  0.1 mg Oral BID  . feeding supplement (ENSURE ENLIVE)   Oral BID  . heparin  5,000 Units Subcutaneous Q8H  . labetalol  300 mg Oral BID  . multivitamin with minerals  1 tablet Oral Daily  . senna-docusate  1 tablet Oral BID   Continuous Infusions: . sodium chloride 75 mL/hr at 10/29/18 1125  . sodium chloride 500 mL (10/28/18 1344)  .  ceFAZolin (ANCEF) IV     PRN  Meds:.sodium chloride, acetaminophen **OR** acetaminophen, bisacodyl, hydrALAZINE, magnesium hydroxide, ondansetron **OR** ondansetron (ZOFRAN) IV   PHYSICAL EXAM: Vital signs: Vitals:   10/28/18 2240 10/29/18 0021 10/29/18 0248 10/29/18 0536  BP: (!) 165/111 (!) 175/109 (!) 173/104 (!) 159/116  Pulse:      Resp:      Temp:      TempSrc:      SpO2:      Weight:      Height:       Filed Weights   10/28/18 0959 10/28/18 1530  Weight: 50.9 kg 45.5 kg   Body mass  index is 18.95 kg/m.   General appearance: Awake-nonverbal-does not follow commands.  Not in any distress. HEENT: Atraumatic and Normocephalic Neck: supple Resp:Good air entry bilaterally, no added sounds  CVS: S1 S2 regular GI: Bowel sounds present, Non tender and not distended with no gaurding, rigidity or rebound.No organomegaly Extremities: B/L Lower Ext shows no edema, both legs are warm to touch Neurology: Sickle exam-does not follow commands Musculoskeletal:No digital cyanosis Skin:No Rash, warm and dry  I have personally reviewed following labs and imaging studies  LABORATORY DATA: CBC: Recent Labs  Lab 10/28/18 1020 10/29/18 0539  WBC 13.4* 13.4*  NEUTROABS 10.8*  --   HGB 14.4 13.8  HCT 47.2* 43.9  MCV 95.2 94.2  PLT 260 209    Basic Metabolic Panel: Recent Labs  Lab 10/28/18 1020 10/29/18 0539  NA 147* 148*  K 4.0 3.6  CL 112* 114*  CO2 24 22  GLUCOSE 133* 118*  BUN 35* 27*  CREATININE 2.36* 1.56*  CALCIUM 10.2 9.5    GFR: Estimated Creatinine Clearance: 22.4 mL/min (A) (by C-G formula based on SCr of 1.56 mg/dL (H)).  Liver Function Tests: Recent Labs  Lab 10/28/18 1020  AST 24  ALT 22  ALKPHOS 75  BILITOT 0.8  PROT 8.3*  ALBUMIN 3.8   No results for input(s): LIPASE, AMYLASE in the last 168 hours. No results for input(s): AMMONIA in the last 168 hours.  Coagulation Profile: No results for input(s): INR, PROTIME in the last 168 hours.  Cardiac Enzymes: No results for input(s): CKTOTAL, CKMB, CKMBINDEX, TROPONINI in the last 168 hours.  BNP (last 3 results) No results for input(s): PROBNP in the last 8760 hours.  HbA1C: No results for input(s): HGBA1C in the last 72 hours.  CBG: No results for input(s): GLUCAP in the last 168 hours.  Lipid Profile: No results for input(s): CHOL, HDL, LDLCALC, TRIG, CHOLHDL, LDLDIRECT in the last 72 hours.  Thyroid Function Tests: No results for input(s): TSH, T4TOTAL, FREET4, T3FREE,  THYROIDAB in the last 72 hours.  Anemia Panel: No results for input(s): VITAMINB12, FOLATE, FERRITIN, TIBC, IRON, RETICCTPCT in the last 72 hours.  Urine analysis:    Component Value Date/Time   COLORURINE AMBER (A) 10/28/2018 1215   APPEARANCEUR CLOUDY (A) 10/28/2018 1215   LABSPEC 1.029 10/28/2018 1215   PHURINE 5.0 10/28/2018 1215   GLUCOSEU NEGATIVE 10/28/2018 1215   HGBUR NEGATIVE 10/28/2018 1215   BILIRUBINUR NEGATIVE 10/28/2018 1215   KETONESUR 20 (A) 10/28/2018 1215   PROTEINUR 30 (A) 10/28/2018 1215   UROBILINOGEN 0.2 05/17/2012 1424   NITRITE NEGATIVE 10/28/2018 1215   LEUKOCYTESUR NEGATIVE 10/28/2018 1215    Sepsis Labs: Lactic Acid, Venous    Component Value Date/Time   LATICACIDVEN 1.4 10/28/2018 1020    MICROBIOLOGY: Recent Results (from the past 240 hour(s))  Blood Culture (routine x 2)     Status: None (Preliminary  result)   Collection Time: 10/28/18 10:08 AM  Result Value Ref Range Status   Specimen Description BLOOD LEFT HAND  Final   Special Requests   Final    BOTTLES DRAWN AEROBIC ONLY Blood Culture results may not be optimal due to an inadequate volume of blood received in culture bottles   Culture   Final    NO GROWTH < 24 HOURS Performed at [facility], [address], [place], [place] [address]    Report Status PENDING  Incomplete  Blood Culture (routine x 2)     Status: None (Preliminary result)   Collection Time: 10/28/18 10:20 AM  Result Value Ref Range Status   Specimen Description BLOOD LEFT HAND  Final   Special Requests   Final    BOTTLES DRAWN AEROBIC AND ANAEROBIC Blood Culture adequate volume   Culture  Setup Time   Final    GRAM POSITIVE COCCI ANAEROBIC BOTTLE ONLY CRITICAL RESULT CALLED TO, READ BACK BY AND VERIFIED WITH: PHARMD ANNA LOVE [id number] FCP Performed at [facility], [address] [place], [place] [address]    Culture GRAM POSITIVE COCCI  Final   Report Status PENDING  Incomplete  Blood Culture  ID Panel (Reflexed)     Status: Abnormal   Collection Time: 10/28/18 10:20 AM  Result Value Ref Range Status   Enterococcus species NOT DETECTED NOT DETECTED Final   Listeria monocytogenes NOT DETECTED NOT DETECTED Final   Staphylococcus species DETECTED (A) NOT DETECTED Final    Comment: Methicillin (oxacillin) susceptible coagulase negative staphylococcus. Possible blood culture contaminant (unless isolated from more than one blood culture draw or clinical case suggests pathogenicity). No antibiotic treatment is indicated for blood  culture contaminants. CRITICAL RESULT CALLED TO, READ BACK BY AND VERIFIED WITH: PHARMD ANNA LOVE [phone] FCP    Staphylococcus aureus (BCID) NOT DETECTED NOT DETECTED Final   Methicillin resistance NOT DETECTED NOT DETECTED Final   Streptococcus species NOT DETECTED NOT DETECTED Final   Streptococcus agalactiae NOT DETECTED NOT DETECTED Final   Streptococcus pneumoniae NOT DETECTED NOT DETECTED Final   Streptococcus pyogenes NOT DETECTED NOT DETECTED Final   Acinetobacter baumannii NOT DETECTED NOT DETECTED Final   Enterobacteriaceae species NOT DETECTED NOT DETECTED Final   Enterobacter cloacae complex NOT DETECTED NOT DETECTED Final   Escherichia coli NOT DETECTED NOT DETECTED Final   Klebsiella oxytoca NOT DETECTED NOT DETECTED Final   Klebsiella pneumoniae NOT DETECTED NOT DETECTED Final   Proteus species NOT DETECTED NOT DETECTED Final   Serratia marcescens NOT DETECTED NOT DETECTED Final   Haemophilus influenzae NOT DETECTED NOT DETECTED Final   Neisseria meningitidis NOT DETECTED NOT DETECTED Final   Pseudomonas aeruginosa NOT DETECTED NOT DETECTED Final   Candida albicans NOT DETECTED NOT DETECTED Final   Candida glabrata NOT DETECTED NOT DETECTED Final   Candida krusei NOT DETECTED NOT DETECTED Final   Candida parapsilosis NOT DETECTED NOT DETECTED Final   Candida tropicalis NOT DETECTED NOT DETECTED Final    Comment: Performed at [facility], [address], [place], [place] [address]  MRSA PCR Screening     Status: None   Collection Time: 10/28/18  5:58 PM  Result Value Ref Range Status   MRSA by PCR NEGATIVE NEGATIVE Final    Comment:        The GeneXpert MRSA Assay (FDA approved for NASAL specimens only), is one component of a comprehensive MRSA colonization surveillance program. It is not  intended to diagnose MRSA infection nor to guide or monitor treatment for MRSA infections. Performed at [facility], [address], [place], [place] [address]     RADIOLOGY STUDIES/RESULTS: Dg Chest Port 1 View  Result Date: 10/28/2018 CLINICAL DATA:  Fever.  Altered mental status. EXAM: PORTABLE CHEST 1 VIEW COMPARISON:  11/09/2017. FINDINGS: Prior CABG. Cardiomegaly. No pulmonary venous congestion. No focal infiltrate. No pleural effusion or pneumothorax. No acute bony abnormality. IMPRESSION: 1.  Prior CABG.  Cardiomegaly.  No pulmonary venous congestion. 2.  No focal infiltrate. Electronically Signed   By: Maisie Fus  Register   On: 10/28/2018 11:43     LOS: 0 days   Jeoffrey Massed, MD  [facility]  If 7PM-7AM, please contact night-coverage  Please page via [email or web address]  Go to [email or web address] and use [facility] universal password to access. If you do not have the password, please contact the hospital operator.  Locate the [facility] provider you are looking for under [facility] and page to a number that you can be directly reached. If you still have difficulty reaching the provider, please page the [facility] (Director on Call) for the Hospitalists listed on amion for assistance.  10/29/2018, 12:07 PM

## 2018-10-29 NOTE — Progress Notes (Signed)
SLP Cancellation Note  Patient Details Name: Amanda [name] MRN: [id number] DOB: Dec 11, 1945   Cancelled treatment:        Patient is too lethargic for evaluation. Will see at next available time.    Lindalou Hose Darrelle Wiberg, MA, CCC-SLP 10/29/2018 12:20 PM

## 2018-10-29 NOTE — Care Management Obs Status (Signed)
MEDICARE OBSERVATION STATUS NOTIFICATION   Patient Details  Name: Amanda [name] MRN: [id number] Date of Birth: 04-23-1946   Medicare Observation Status Notification Given:  Yes    Nataliee Shurtz, LCSW 10/29/2018, 4:31 PM

## 2018-10-29 NOTE — Progress Notes (Addendum)
PHARMACY - PHYSICIAN COMMUNICATION CRITICAL VALUE ALERT - BLOOD CULTURE IDENTIFICATION (BCID)  Amanda [name] is an 73 y.o. female who presented to [facility] on 10/28/2018 with a chief complaint of fever of unknown etiology / sepsis.   Assessment:  Patient has received one dose of vancomycin and one dose of cefepime thus far. Patient has variable renal function with an AKI and creatinine clearance at 22.8ml/min this morning. Source of infection is unknown. BCID alert returned with staph species and no resistance (very likely MSSA)  Name of physician (or Provider) Contacted: Jeoffrey Massed, MD  Current antibiotics: Vancomycin and Cefepime  Changes to prescribed antibiotics recommended: Cefazolin 2g IV q12h Recommendations accepted by provider  Results for orders placed or performed during the hospital encounter of 10/28/18  Blood Culture ID Panel (Reflexed) (Collected: 10/28/2018 10:20 AM)  Result Value Ref Range   Enterococcus species NOT DETECTED NOT DETECTED   Listeria monocytogenes NOT DETECTED NOT DETECTED   Staphylococcus species DETECTED (A) NOT DETECTED   Staphylococcus aureus (BCID) NOT DETECTED NOT DETECTED   Methicillin resistance NOT DETECTED NOT DETECTED   Streptococcus species NOT DETECTED NOT DETECTED   Streptococcus agalactiae NOT DETECTED NOT DETECTED   Streptococcus pneumoniae NOT DETECTED NOT DETECTED   Streptococcus pyogenes NOT DETECTED NOT DETECTED   Acinetobacter baumannii NOT DETECTED NOT DETECTED   Enterobacteriaceae species NOT DETECTED NOT DETECTED   Enterobacter cloacae complex NOT DETECTED NOT DETECTED   Escherichia coli NOT DETECTED NOT DETECTED   Klebsiella oxytoca NOT DETECTED NOT DETECTED   Klebsiella pneumoniae NOT DETECTED NOT DETECTED   Proteus species NOT DETECTED NOT DETECTED   Serratia marcescens NOT DETECTED NOT DETECTED   Haemophilus influenzae NOT DETECTED NOT DETECTED   Neisseria meningitidis NOT DETECTED NOT DETECTED   Pseudomonas  aeruginosa NOT DETECTED NOT DETECTED   Candida albicans NOT DETECTED NOT DETECTED   Candida glabrata NOT DETECTED NOT DETECTED   Candida krusei NOT DETECTED NOT DETECTED   Candida parapsilosis NOT DETECTED NOT DETECTED   Candida tropicalis NOT DETECTED NOT DETECTED    Thank you for allowing pharmacy to be a part of this patient's care.  Bradley Ferris, PharmD 10/29/2018 8:02 AM PGY-1 Pharmacy Resident Direct Phone: [phone] Please check [email or web address] for unit-specific pharmacist phone numbers

## 2018-10-30 ENCOUNTER — Inpatient Hospital Stay (HOSPITAL_COMMUNITY): Payer: Medicare Other

## 2018-10-30 DIAGNOSIS — G309 Alzheimer's disease, unspecified: Secondary | ICD-10-CM

## 2018-10-30 DIAGNOSIS — F0280 Dementia in other diseases classified elsewhere without behavioral disturbance: Secondary | ICD-10-CM

## 2018-10-30 DIAGNOSIS — I10 Essential (primary) hypertension: Secondary | ICD-10-CM

## 2018-10-30 LAB — CBC
HCT: 39.6 % (ref 36.0–46.0)
HEMOGLOBIN: 12.8 g/dL (ref 12.0–15.0)
MCH: 30.3 pg (ref 26.0–34.0)
MCHC: 32.3 g/dL (ref 30.0–36.0)
MCV: 93.8 fL (ref 80.0–100.0)
Platelets: 230 10*3/uL (ref 150–400)
RBC: 4.22 MIL/uL (ref 3.87–5.11)
RDW: 13.3 % (ref 11.5–15.5)
WBC: 9.8 10*3/uL (ref 4.0–10.5)
nRBC: 0 % (ref 0.0–0.2)

## 2018-10-30 LAB — BASIC METABOLIC PANEL
Anion gap: 11 (ref 5–15)
BUN: 19 mg/dL (ref 8–23)
CHLORIDE: 110 mmol/L (ref 98–111)
CO2: 21 mmol/L — AB (ref 22–32)
Calcium: 8.9 mg/dL (ref 8.9–10.3)
Creatinine, Ser: 1.23 mg/dL — ABNORMAL HIGH (ref 0.44–1.00)
GFR calc Af Amer: 50 mL/min — ABNORMAL LOW (ref >60)
GFR calc non Af Amer: 43 mL/min — ABNORMAL LOW (ref >60)
Glucose, Bld: 96 mg/dL (ref 70–99)
Potassium: 3.2 mmol/L — ABNORMAL LOW (ref 3.5–5.1)
Sodium: 142 mmol/L (ref 135–145)

## 2018-10-30 MED ORDER — RESOURCE THICKENUP CLEAR PO POWD
ORAL | Status: DC | PRN
  Filled 2018-10-30: qty 125

## 2018-10-30 MED ORDER — POTASSIUM CHLORIDE 20 MEQ/15ML (10%) PO SOLN
40.0000 meq | Freq: Once | ORAL | Status: AC
  Administered 2018-10-30: 40 meq via ORAL
  Filled 2018-10-30: qty 30

## 2018-10-30 NOTE — Evaluation (Signed)
Clinical/Bedside Swallow Evaluation Patient Details  Name: Amanda [name] MRN: [id number] Date of Birth: 05/29/1946  Today's Date: 10/30/2018 Time: SLP Start Time (ACUTE ONLY): 1122 SLP Stop Time (ACUTE ONLY): 1148 SLP Time Calculation (min) (ACUTE ONLY): 26 min  Past Medical History:  Past Medical History:  Diagnosis Date  . Dementia (HCC)    "alzheimer's" (05/18/2012)  . Fall 05/17/2012  . Fracture of medial wall of orbit 05/17/2012  . Gout    "? feet" (05/18/2012); uric acid 9.4 on 08/06/16 with R wrist pain; 7.1 on 08/15/16 on Allopurinol  . Hypertension   . Incontinence of urine   . Stroke [facility]) ~ 2003   "slight memory loss" (05/18/2012)  . Wrist fracture, bilateral 05/17/2012   "fell down steps" (05/18/2012)   Past Surgical History:  Past Surgical History:  Procedure Laterality Date  . fractured tooth     01/06/17 Dr Ocie Doyne DMD extracted tooth  #4   . VAGINAL HYSTERECTOMY     HPI:  Pt is a 73 y.o. female, with medical history significant of stroke (non-verbal and at baseline nonverbal does not follow commands), hypertension, dementia, gout, s/p of aortic aneurysm repair, CKD and hypertension. She was sent from [facility] secondary to one episode of nonbloody vomiting on 10/27/18 without desaturation or increased work of breathing, but on 10/28/18 she was febrile at 101 F, more lethargic, and obtunded than her baseline, so she was sent to ED for further evaluation. Upon admission, pt's husband stated that he was unable to see her for last week secondary to nursing home visitation restrictions but that she was back at baseline. Chest x-ray was negative for focal infiltrate. Pt is known to speech pathology and was most recently seen on 11/12/17 for bedside swallow evaluation when she was admitted for sepsis and hypernatremia. A dysphagia 1 diet with nectar thick liquids was recommended at that time. [facility] was contacted via phone. SLP was unable to reach pt's dietitian/RN  but kitchen staff reported that the pt was most recently on a puree diet with honey thick liquids.    Assessment / Plan / Recommendation Clinical Impression  Pt was seen for bedside swallow evaluation. She was alert throughout the session but did not communicate verbally and was unable to consistently follow commands. An oral mechanism exam could not be completed due to pt's difficulty following commands but she presented with natural dentition. Bruxism and lateral lingual movements/rolling were noted throughout the evaluation. Pt was on a puree diet with honey thick liquids prior to admission and the pt's husband reported that he typically feeds her all three meals per day and liquids via tsp and did not observe and any signs of aspiration. Pt tolerated puree solids and honey thick liquids via tsp, cup, and straw without overt s/sx of aspiration. Throat clearing was intermittently noted with nectar thick liquids, suggesting possible aspiration. An oral and/or pharyngeal delay is suspected due to time of presentation compared to that of hyolaryngeal elevation. Pt does not present as a candidate for instrumental assessment at this time due to lower extremity contractions which will likely limit/prohibit her ability to sit upright. It is recommended that a dysphagia 1 (puree) diet with honey thick liquids be initiated at this time. SLP will follow to ensure diet tolerance.  SLP Visit Diagnosis: Dysphagia, oropharyngeal phase (R13.12)    Aspiration Risk  Mild aspiration risk;Moderate aspiration risk    Diet Recommendation Dysphagia 1 (Puree);Honey-thick liquid   Liquid Administration via: Cup;Spoon Medication Administration: Crushed  with puree Supervision: Full supervision/cueing for compensatory strategies Compensations: Slow rate;Small sips/bites;Follow solids with liquid Postural Changes: Seated upright at 90 degrees;Remain upright for at least 30 minutes after po intake    Other  Recommendations  Oral Care Recommendations: Oral care BID;Staff/trained caregiver to provide oral care Other Recommendations: Order thickener from pharmacy   Follow up Recommendations Skilled Nursing facility      Frequency and Duration min 2x/week  2 weeks       Prognosis Prognosis for Safe Diet Advancement: Fair Barriers to Reach Goals: Cognitive deficits;Severity of deficits;Other (Comment)(Prior level of function)      Swallow Study   General Date of Onset: 10/28/18 HPI: Pt is a 73 y.o. female, with medical history significant of stroke (non-verbal and at baseline nonverbal does not follow commands), hypertension, dementia, gout, s/p of aortic aneurysm repair, CKD and hypertension. She was sent from [facility] secondary to one episode of nonbloody vomiting on 10/27/18 without desaturation or increased work of breathing, but on 10/28/18 she was febrile at 101 F, more lethargic, and obtunded than her baseline, so she was sent to ED for further evaluation. Upon admission, pt's husband stated that he was unable to see her for last week secondary to nursing home visitation restrictions but that she was back at baseline. Chest x-ray was negative for focal infiltrate. Pt is known to speech pathology and was most recently seen on 11/12/17 for bedside swallow evaluation when she was admitted for sepsis and hypernatremia. A dysphagia 1 diet with nectar thick liquids was recommended at that time. [facility] was contacted via phone. SLP was unable to reach pt's dietitian/RN but kitchen staff reported that the pt was most recently on a puree diet with honey thick liquids.  Type of Study: Bedside Swallow Evaluation Previous Swallow Assessment: See HPI Diet Prior to this Study: NPO Temperature Spikes Noted: No Respiratory Status: Room air History of Recent Intubation: No Behavior/Cognition: Alert;Cooperative;Doesn't follow directions Oral Cavity Assessment: Within Functional Limits Oral Care Completed by SLP:  Recent completion by staff Oral Cavity - Dentition: Adequate natural dentition Self-Feeding Abilities: Total assist Patient Positioning: Upright in bed;Postural control adequate for testing Baseline Vocal Quality: Not observed(Pt non-verbal) Volitional Cough: Cognitively unable to elicit Volitional Swallow: Unable to elicit    Oral/Motor/Sensory Function Overall Oral Motor/Sensory Function: Other (comment)(Pt unable to follow commands for assessment)   Ice Chips Ice chips: Not tested   Thin Liquid Thin Liquid: Not tested    Nectar Thick Nectar Thick Liquid: Impaired Presentation: Cup;Straw Oral Phase Impairments: Reduced lingual movement/coordination Pharyngeal Phase Impairments: Throat Clearing - Immediate   Honey Thick Honey Thick Liquid: Impaired Presentation: Cup;Spoon;Straw Oral Phase Impairments: Reduced lingual movement/coordination Oral Phase Functional Implications: Prolonged oral transit   Puree Puree: Impaired Presentation: Spoon Oral Phase Impairments: Reduced lingual movement/coordination   Solid     Solid: Not tested     Yuktha Kerchner I. Vear Clock, MS, CCC-SLP Acute Rehabilitation Services Office number [phone] Pager [phone]  Scheryl Marten 10/30/2018,12:54 PM

## 2018-10-30 NOTE — Progress Notes (Signed)
Paged Blount,NP re: order for pt. on droplet isolation and test negative for flu; order written for d/c.

## 2018-10-30 NOTE — Progress Notes (Addendum)
PROGRESS NOTE        PATIENT DETAILS Name: Amanda [name] Age: 73 y.o. Sex: female Date of Birth: 12-29-1945 Admit Date: 10/28/2018 Admitting Physician Starleen Arms, MD YDX:AJOINO, Titus Dubin, MD  Brief Narrative: Patient is a 73 y.o. female with history of CVA, dementia, s/p aortic aneurysm repair-Per H&P-nonverbal at baseline-resident of SNF-presented to the ED for evaluation of fever, vomiting-found to have MSSA bacteremia.  See below for further details.  Subjective: Does not talk-sometimes tracks my movements.  No family at bedside.  However lying comfortably in bed.  Assessment/Plan: Sepsis: Initially thought to have MSSA bacteremia-based on BCID cultures-however further culture this morning is suggestive of coagulase negative staph one of 1/2 cultures drawn on admission.  Repeat blood cultures on 3/21- so far.  Suspect that this may be a contamination-no fever since admission-no other foci evident at this point.  Plan is to stop antimicrobial therapy and see how she does.  Repeat Chest x-ray-I suspect she may be at high risk of aspiration-and may have micoraspiration.  AKI on CKD stage III: AKI hemodynamically mediated-significantly improved with supportive care.    Acute metabolic encephalopathy: Secondary to sepsis and MSSA bacteremia along with AKI.  No family at bedside--she is nonverbal and is contracted/nonambulatory at baseline and suspected current mental status is at her baseline.  Mild hypernatremia: Improved.  Hypertension: Controlled-continue with clonidine, labetalol,  Stage II sacral ulcer: Present prior to [facility] care evaluation.  Dementia/CVA (nonverbal): resident of SNF-suspect she is at baseline-await arrival of family members.  DVT Prophylaxis: Prophylactic Heparin  Code Status: Full code  Family Communication: None at bedside  Addendum:spoke with spouse over the phone.  Disposition Plan: Remain  inpatient- SNF on discharge  Antimicrobial agents: Anti-infectives (From admission, onward)   Start     Dose/Rate Route Frequency Ordered Stop   10/29/18 1000  ceFAZolin (ANCEF) IVPB 2g/100 mL premix     2 g 200 mL/hr over 30 Minutes Intravenous Every 12 hours 10/29/18 0911     10/28/18 1330  cefTRIAXone (ROCEPHIN) 2 g in sodium chloride 0.9 % 100 mL IVPB  Status:  Discontinued     2 g 200 mL/hr over 30 Minutes Intravenous  Once 10/28/18 1316 10/28/18 1317   10/28/18 1330  vancomycin (VANCOCIN) IVPB 1000 mg/200 mL premix     1,000 mg 200 mL/hr over 60 Minutes Intravenous  Once 10/28/18 1318 10/28/18 1454   10/28/18 1330  ceFEPIme (MAXIPIME) 1 g in sodium chloride 0.9 % 100 mL IVPB  Status:  Discontinued     1 g 200 mL/hr over 30 Minutes Intravenous Every 24 hours 10/28/18 1326 10/29/18 0910   10/28/18 1318  vancomycin variable dose per unstable renal function (pharmacist dosing)  Status:  Discontinued      Does not apply See admin instructions 10/28/18 1318 10/29/18 0911      Procedures: None  CONSULTS:  None  Time spent: 25- minutes-Greater than 50% of this time was spent in counseling, explanation of diagnosis, planning of further management, and coordination of care.  MEDICATIONS: Scheduled Meds: . calcium-vitamin D   Oral Daily  . cloNIDine  0.1 mg Oral BID  . feeding supplement (ENSURE ENLIVE)   Oral BID  . heparin  5,000 Units Subcutaneous Q8H  . labetalol  300 mg Oral BID  . multivitamin with minerals  1 tablet Oral Daily  .  senna-docusate  1 tablet Oral BID   Continuous Infusions: . sodium chloride 75 mL/hr at 10/30/18 0215  .  ceFAZolin (ANCEF) IV 2 g (10/29/18 2203)   PRN Meds:.acetaminophen **OR** acetaminophen, bisacodyl, hydrALAZINE, magnesium hydroxide, ondansetron **OR** ondansetron (ZOFRAN) IV   PHYSICAL EXAM: Vital signs: Vitals:   10/29/18 1716 10/29/18 1841 10/29/18 2140 10/30/18 0527  BP: (!) 158/101  (!) 153/103 (!) 163/115  Pulse: (!) 104   (!) 109 93  Resp:   20 20  Temp:  98.4 F (36.9 C) 99.7 F (37.6 C) 99.2 F (37.3 C)  TempSrc:  Oral Oral Oral  SpO2: 97%  97% 98%  Weight:      Height:       Filed Weights   10/28/18 0959 10/28/18 1530  Weight: 50.9 kg 45.5 kg   Body mass index is 18.95 kg/m.   General appearance: Does not respond-nonverbal.  Opens eyes but does not track my movements Eyes:no scleral icterus. HEENT: Atraumatic and Normocephalic Neck: supple, no JVD. Resp:Good air entry bilaterally, no added sounds heard anteriorly CVS: S1 S2 regular GI: Bowel sounds present, Non tender and not distended with no gaurding, rigidity or rebound. Extremities: B/L Lower Ext shows no edema, both legs are warm to touch Neurology: He is to have bilateral upper extremity contractures, left lower extremity also appears to be contracted-unable to evaluate. Psychiatric: Normal judgment and insight. Normal mood. Musculoskeletal:No digital cyanosis Skin:No Rash, warm and dry Wounds:N/A  I have personally reviewed following labs and imaging studies  LABORATORY DATA: CBC: Recent Labs  Lab 10/28/18 1020 10/29/18 0539 10/30/18 0454  WBC 13.4* 13.4* 9.8  NEUTROABS 10.8*  --   --   HGB 14.4 13.8 12.8  HCT 47.2* 43.9 39.6  MCV 95.2 94.2 93.8  PLT 260 209 230    Basic Metabolic Panel: Recent Labs  Lab 10/28/18 1020 10/29/18 0539 10/30/18 0454  NA 147* 148* 142  K 4.0 3.6 3.2*  CL 112* 114* 110  CO2 24 22 21*  GLUCOSE 133* 118* 96  BUN 35* 27* 19  CREATININE 2.36* 1.56* 1.23*  CALCIUM 10.2 9.5 8.9    GFR: Estimated Creatinine Clearance: 28.4 mL/min (A) (by C-G formula based on SCr of 1.23 mg/dL (H)).  Liver Function Tests: Recent Labs  Lab 10/28/18 1020  AST 24  ALT 22  ALKPHOS 75  BILITOT 0.8  PROT 8.3*  ALBUMIN 3.8   No results for input(s): LIPASE, AMYLASE in the last 168 hours. No results for input(s): AMMONIA in the last 168 hours.  Coagulation Profile: No results for input(s): INR,  PROTIME in the last 168 hours.  Cardiac Enzymes: No results for input(s): CKTOTAL, CKMB, CKMBINDEX, TROPONINI in the last 168 hours.  BNP (last 3 results) No results for input(s): PROBNP in the last 8760 hours.  HbA1C: No results for input(s): HGBA1C in the last 72 hours.  CBG: No results for input(s): GLUCAP in the last 168 hours.  Lipid Profile: No results for input(s): CHOL, HDL, LDLCALC, TRIG, CHOLHDL, LDLDIRECT in the last 72 hours.  Thyroid Function Tests: No results for input(s): TSH, T4TOTAL, FREET4, T3FREE, THYROIDAB in the last 72 hours.  Anemia Panel: No results for input(s): VITAMINB12, FOLATE, FERRITIN, TIBC, IRON, RETICCTPCT in the last 72 hours.  Urine analysis:    Component Value Date/Time   COLORURINE AMBER (A) 10/28/2018 1215   APPEARANCEUR CLOUDY (A) 10/28/2018 1215   LABSPEC 1.029 10/28/2018 1215   PHURINE 5.0 10/28/2018 1215   GLUCOSEU NEGATIVE 10/28/2018 1215  HGBUR NEGATIVE 10/28/2018 1215   BILIRUBINUR NEGATIVE 10/28/2018 1215   KETONESUR 20 (A) 10/28/2018 1215   PROTEINUR 30 (A) 10/28/2018 1215   UROBILINOGEN 0.2 05/17/2012 1424   NITRITE NEGATIVE 10/28/2018 1215   LEUKOCYTESUR NEGATIVE 10/28/2018 1215    Sepsis Labs: Lactic Acid, Venous    Component Value Date/Time   LATICACIDVEN 1.4 10/28/2018 1020    MICROBIOLOGY: Recent Results (from the past 240 hour(s))  Blood Culture (routine x 2)     Status: None (Preliminary result)   Collection Time: 10/28/18 10:08 AM  Result Value Ref Range Status   Specimen Description BLOOD LEFT HAND  Final   Special Requests   Final    BOTTLES DRAWN AEROBIC ONLY Blood Culture results may not be optimal due to an inadequate volume of blood received in culture bottles   Culture   Final    NO GROWTH 2 DAYS Performed at [facility], [address] [place], [place] [address]    Report Status PENDING  Incomplete  Blood Culture (routine x 2)     Status: Abnormal (Preliminary result)   Collection  Time: 10/28/18 10:20 AM  Result Value Ref Range Status   Specimen Description BLOOD LEFT HAND  Final   Special Requests   Final    BOTTLES DRAWN AEROBIC AND ANAEROBIC Blood Culture adequate volume   Culture  Setup Time   Final    GRAM POSITIVE COCCI ANAEROBIC BOTTLE ONLY CRITICAL RESULT CALLED TO, READ BACK BY AND VERIFIED WITH: PHARMD ANNA LOVE [phone] [id number] FCP    Culture (A)  Final    STAPHYLOCOCCUS SPECIES (COAGULASE NEGATIVE) THE SIGNIFICANCE OF ISOLATING THIS ORGANISM FROM A SINGLE SET OF BLOOD CULTURES WHEN MULTIPLE SETS ARE DRAWN IS UNCERTAIN. PLEASE NOTIFY THE MICROBIOLOGY DEPARTMENT WITHIN ONE WEEK IF SPECIATION AND SENSITIVITIES ARE REQUIRED. Performed at [facility], [address] [place], [place] [address]    Report Status PENDING  Incomplete  Blood Culture ID Panel (Reflexed)     Status: Abnormal   Collection Time: 10/28/18 10:20 AM  Result Value Ref Range Status   Enterococcus species NOT DETECTED NOT DETECTED Final   Listeria monocytogenes NOT DETECTED NOT DETECTED Final   Staphylococcus species DETECTED (A) NOT DETECTED Final    Comment: Methicillin (oxacillin) susceptible coagulase negative staphylococcus. Possible blood culture contaminant (unless isolated from more than one blood culture draw or clinical case suggests pathogenicity). No antibiotic treatment is indicated for blood  culture contaminants. CRITICAL RESULT CALLED TO, READ BACK BY AND VERIFIED WITH: PHARMD ANNA LOVE [phone] FCP    Staphylococcus aureus (BCID) NOT DETECTED NOT DETECTED Final   Methicillin resistance NOT DETECTED NOT DETECTED Final   Streptococcus species NOT DETECTED NOT DETECTED Final   Streptococcus agalactiae NOT DETECTED NOT DETECTED Final   Streptococcus pneumoniae NOT DETECTED NOT DETECTED Final   Streptococcus pyogenes NOT DETECTED NOT DETECTED Final   Acinetobacter baumannii NOT DETECTED NOT DETECTED Final   Enterobacteriaceae species NOT DETECTED NOT DETECTED Final    Enterobacter cloacae complex NOT DETECTED NOT DETECTED Final   Escherichia coli NOT DETECTED NOT DETECTED Final   Klebsiella oxytoca NOT DETECTED NOT DETECTED Final   Klebsiella pneumoniae NOT DETECTED NOT DETECTED Final   Proteus species NOT DETECTED NOT DETECTED Final   Serratia marcescens NOT DETECTED NOT DETECTED Final   Haemophilus influenzae NOT DETECTED NOT DETECTED Final   Neisseria meningitidis NOT DETECTED NOT DETECTED Final   Pseudomonas aeruginosa NOT DETECTED NOT DETECTED Final   Candida  albicans NOT DETECTED NOT DETECTED Final   Candida glabrata NOT DETECTED NOT DETECTED Final   Candida krusei NOT DETECTED NOT DETECTED Final   Candida parapsilosis NOT DETECTED NOT DETECTED Final   Candida tropicalis NOT DETECTED NOT DETECTED Final    Comment: Performed at [facility], [address] [place], [place] [address]  Urine culture     Status: Abnormal   Collection Time: 10/28/18 12:15 PM  Result Value Ref Range Status   Specimen Description URINE, CLEAN CATCH  Final   Special Requests NONE  Final   Culture (A)  Final    <10,000 COLONIES/mL INSIGNIFICANT GROWTH Performed at [facility], [address] [place], [place] [address]    Report Status 10/29/2018 FINAL  Final  MRSA PCR Screening     Status: None   Collection Time: 10/28/18  5:58 PM  Result Value Ref Range Status   MRSA by PCR NEGATIVE NEGATIVE Final    Comment:        The GeneXpert MRSA Assay (FDA approved for NASAL specimens only), is one component of a comprehensive MRSA colonization surveillance program. It is not intended to diagnose MRSA infection nor to guide or monitor treatment for MRSA infections. Performed at [facility], [address] [place], [place] [address]   Culture, blood (routine x 2)     Status: None (Preliminary result)   Collection Time: 10/29/18 12:39 PM  Result Value Ref Range Status   Specimen Description BLOOD LEFT WRIST  Final   Special Requests   Final     BOTTLES DRAWN AEROBIC ONLY Blood Culture adequate volume   Culture   Final    NO GROWTH < 24 HOURS Performed at [facility], [address] [place], [place] [address]    Report Status PENDING  Incomplete  Culture, blood (routine x 2)     Status: None (Preliminary result)   Collection Time: 10/29/18 12:39 PM  Result Value Ref Range Status   Specimen Description BLOOD LEFT HAND  Final   Special Requests   Final    BOTTLES DRAWN AEROBIC ONLY Blood Culture adequate volume   Culture   Final    NO GROWTH < 24 HOURS Performed at [facility], [address] [place], [place] [address]    Report Status PENDING  Incomplete    RADIOLOGY STUDIES/RESULTS: Dg Chest Port 1 View  Result Date: 10/28/2018 CLINICAL DATA:  Fever.  Altered mental status. EXAM: PORTABLE CHEST 1 VIEW COMPARISON:  11/09/2017. FINDINGS: Prior CABG. Cardiomegaly. No pulmonary venous congestion. No focal infiltrate. No pleural effusion or pneumothorax. No acute bony abnormality. IMPRESSION: 1.  Prior CABG.  Cardiomegaly.  No pulmonary venous congestion. 2.  No focal infiltrate. Electronically Signed   By: Maisie Fushomas  Register   On: 10/28/2018 11:43     LOS: 1 day   Jeoffrey MassedShanker Ghimire, MD  [facility]  If 7PM-7AM, please contact night-coverage  Please page via [email or web address]  Go to [email or web address] and use [facility] universal password to access. If you do not have the password, please contact the hospital operator.  Locate the [facility] provider you are looking for under [facility] and page to a number that you can be directly reached. If you still have difficulty reaching the provider, please page the [facility] (Director on Call) for the Hospitalists listed on amion for assistance.  10/30/2018, 12:13 PM

## 2018-10-30 NOTE — Plan of Care (Signed)
  Problem: Education: Goal: Knowledge of General Education information will improve Description Including pain rating scale, medication(s)/side effects and non-pharmacologic comfort measures Outcome: Not Progressing Note:  POC reviewed with pt; unsure if pt. understands what is said.

## 2018-10-31 ENCOUNTER — Inpatient Hospital Stay (HOSPITAL_COMMUNITY): Payer: Medicare Other

## 2018-10-31 DIAGNOSIS — D638 Anemia in other chronic diseases classified elsewhere: Secondary | ICD-10-CM

## 2018-10-31 DIAGNOSIS — J690 Pneumonitis due to inhalation of food and vomit: Secondary | ICD-10-CM

## 2018-10-31 DIAGNOSIS — K56609 Unspecified intestinal obstruction, unspecified as to partial versus complete obstruction: Secondary | ICD-10-CM

## 2018-10-31 LAB — CBC
HCT: 41.3 % (ref 36.0–46.0)
HEMATOCRIT: 39.1 % (ref 36.0–46.0)
HEMOGLOBIN: 12.8 g/dL (ref 12.0–15.0)
Hemoglobin: 12.8 g/dL (ref 12.0–15.0)
MCH: 28.9 pg (ref 26.0–34.0)
MCH: 30 pg (ref 26.0–34.0)
MCHC: 31 g/dL (ref 30.0–36.0)
MCHC: 32.7 g/dL (ref 30.0–36.0)
MCV: 93.2 fL (ref 80.0–100.0)
MCV: 91.8 fL (ref 80.0–100.0)
Platelets: 209 10*3/uL (ref 150–400)
Platelets: 201 10*3/uL (ref 150–400)
RBC: 4.43 MIL/uL (ref 3.87–5.11)
RBC: 4.26 MIL/uL (ref 3.87–5.11)
RDW: 13 % (ref 11.5–15.5)
RDW: 13 % (ref 11.5–15.5)
WBC: 11.7 10*3/uL — ABNORMAL HIGH (ref 4.0–10.5)
WBC: 11.9 10*3/uL — ABNORMAL HIGH (ref 4.0–10.5)
nRBC: 0 % (ref 0.0–0.2)
nRBC: 0 % (ref 0.0–0.2)

## 2018-10-31 LAB — LIPASE, BLOOD: Lipase: 42 U/L (ref 11–51)

## 2018-10-31 LAB — CULTURE, BLOOD (ROUTINE X 2): Special Requests: ADEQUATE

## 2018-10-31 LAB — BASIC METABOLIC PANEL
Anion gap: 11 (ref 5–15)
BUN: 19 mg/dL (ref 8–23)
CO2: 20 mmol/L — ABNORMAL LOW (ref 22–32)
Calcium: 8.7 mg/dL — ABNORMAL LOW (ref 8.9–10.3)
Chloride: 110 mmol/L (ref 98–111)
Creatinine, Ser: 1.31 mg/dL — ABNORMAL HIGH (ref 0.44–1.00)
GFR calc Af Amer: 46 mL/min — ABNORMAL LOW (ref >60)
GFR calc non Af Amer: 40 mL/min — ABNORMAL LOW (ref >60)
Glucose, Bld: 125 mg/dL — ABNORMAL HIGH (ref 70–99)
Potassium: 3.6 mmol/L (ref 3.5–5.1)
Sodium: 141 mmol/L (ref 135–145)

## 2018-10-31 MED ORDER — PIPERACILLIN-TAZOBACTAM 3.375 G IVPB
3.3750 g | Freq: Three times a day (TID) | INTRAVENOUS | Status: DC
  Administered 2018-10-31 – 2018-11-03 (×10): 3.375 g via INTRAVENOUS
  Filled 2018-10-31 (×10): qty 50

## 2018-10-31 MED ORDER — ALBUTEROL SULFATE (2.5 MG/3ML) 0.083% IN NEBU: 2.5000 mg | INHALATION_SOLUTION | RESPIRATORY_TRACT | Status: DC | PRN

## 2018-10-31 MED ORDER — DIATRIZOATE MEGLUMINE & SODIUM 66-10 % PO SOLN
90.0000 mL | Freq: Once | ORAL | Status: AC
  Administered 2018-10-31: 90 mL via NASOGASTRIC
  Filled 2018-10-31: qty 90

## 2018-10-31 NOTE — Consult Note (Addendum)
[facility] Surgery Consult Note  Amanda [name] October 13, 1945  [id number].    Requesting MD: Dr. Jerral Ralph  Chief Complaint/Reason for Consult: SBO  HPI: Amanda [name] is a 73 y.o. female with a history of CVA, dementia who is nonverbal at baseline, who currently resides at a SNF and was admitted for sepsis of unknown origin.  This was originally thought to be due to MSSA bacteremia BCID.  Her repeat blood cultures have been negative so far.  Patient began vomiting while in the hospital and is thought to have aspiration pneumonia.  A CT AP scan was performed today that demonstrated a small bowel obstruction with a transition point in the right abdomen.  There is also concerns for some swelling of vasculature suspicious for an internal hernia.  We are asked to consult.  History was limited secondary to patient's nonverbal state.  No family at bedside.  ROS: Review of Systems  Unable to perform ROS: Patient nonverbal   Level 5 Caveat 2/2 non-verbal state  Family History  Problem Relation Age of Onset  . Dementia Mother   . Dementia Father   . Dementia Sister   . Dementia Brother     Past Medical History:  Diagnosis Date  . Dementia (HCC)    "alzheimer's" (05/18/2012)  . Fall 05/17/2012  . Fracture of medial wall of orbit 05/17/2012  . Gout    "? feet" (05/18/2012); uric acid 9.4 on 08/06/16 with R wrist pain; 7.1 on 08/15/16 on Allopurinol  . Hypertension   . Incontinence of urine   . Stroke [facility]) ~ 2003   "slight memory loss" (05/18/2012)  . Wrist fracture, bilateral 05/17/2012   "fell down steps" (05/18/2012)    Past Surgical History:  Procedure Laterality Date  . fractured tooth     01/06/17 Dr Ocie Doyne DMD extracted tooth  #4   . VAGINAL HYSTERECTOMY      Social History:  reports that she has quit smoking. Her smoking use included cigarettes. She has never used smokeless tobacco. She reports that she does not drink alcohol or use drugs.  Allergies:   Allergies  Allergen Reactions  . Pollen Extract Other (See Comments)    rhinitis    Medications Prior to Admission  Medication Sig Dispense Refill  . barrier cream (NON-SPECIFIED) CREA Apply 1 application topically 3 (three) times daily. Apply to inner left buttock each shift    . bisacodyl (BISACODYL LAXATIVE) 10 MG suppository Place 10 mg rectally daily as needed for mild constipation.     . Calcium Carb-Cholecalciferol ([employer] OYSTER SHELL CALCIUM+VIT D PO) Take 500 mg by mouth daily.    . cloNIDine (CATAPRES) 0.1 MG tablet Take 0.1 mg by mouth 2 (two) times daily.     Marland Kitchen labetalol (NORMODYNE) 300 MG tablet Take 300 mg by mouth 2 (two) times daily.    . magnesium hydroxide (MILK OF MAGNESIA) 400 MG/5ML suspension Take 30 mLs by mouth daily as needed for mild constipation.     . Multiple Vitamins-Minerals (MULTIVITAMIN WITH MINERALS) tablet Take 1 tablet by mouth daily.    . Nutritional Supplements (NUTRITIONAL SUPPLEMENT PO) Take 1 each by mouth 2 (two) times daily. Magic Cup    . senna-docusate (SENOKOT-S) 8.6-50 MG tablet Take 1-2 tablets by mouth See admin instructions. Take 1 tablet QAM and 2 tablets QPM    . Sodium Phosphates (RA SALINE ENEMA RE) Place 1 Container rectally daily as needed (constipation).       Prior to Admission medications  Medication Sig Start Date End Date Taking? Authorizing Provider  barrier cream (NON-SPECIFIED) CREA Apply 1 application topically 3 (three) times daily. Apply to inner left buttock each shift   Yes [provider]  bisacodyl (BISACODYL LAXATIVE) 10 MG suppository Place 10 mg rectally daily as needed for mild constipation.    Yes [provider]  Calcium Carb-Cholecalciferol ([employer] OYSTER SHELL CALCIUM+VIT D PO) Take 500 mg by mouth daily.   Yes [provider]  cloNIDine (CATAPRES) 0.1 MG tablet Take 0.1 mg by mouth 2 (two) times daily.    Yes [provider]  labetalol (NORMODYNE) 300 MG tablet Take 300 mg by  mouth 2 (two) times daily.   Yes [provider]  magnesium hydroxide (MILK OF MAGNESIA) 400 MG/5ML suspension Take 30 mLs by mouth daily as needed for mild constipation.    Yes [provider]  Multiple Vitamins-Minerals (MULTIVITAMIN WITH MINERALS) tablet Take 1 tablet by mouth daily.   Yes [provider]  Nutritional Supplements (NUTRITIONAL SUPPLEMENT PO) Take 1 each by mouth 2 (two) times daily. Magic Cup   Yes [provider]  senna-docusate (SENOKOT-S) 8.6-50 MG tablet Take 1-2 tablets by mouth See admin instructions. Take 1 tablet QAM and 2 tablets QPM   Yes [provider]  Sodium Phosphates (RA SALINE ENEMA RE) Place 1 Container rectally daily as needed (constipation).    Yes [provider]    Blood pressure 136/79, pulse 68, temperature 98.1 F (36.7 C), temperature source Oral, resp. rate 20, height  (1.549 m), weight 45.5 kg, SpO2 90 %. Physical Exam: Physical Exam  Constitutional:  Frail, elderly appearing female with chronic contractures, lying on her side. Bilious like emesis noted on towel next to her  HENT:  Head: Normocephalic and atraumatic.  Right Ear: External ear normal.  Left Ear: External ear normal.  Dry MM  Eyes: Pupils are equal, round, and reactive to light. Right eye exhibits no discharge. Left eye exhibits no discharge. No scleral icterus.  Neck: Neck supple.  Cardiovascular: Normal rate and regular rhythm.  No edema  Pulmonary/Chest: No accessory muscle usage. Tachypnea noted. No respiratory distress. She has rhonchi.  Abdominal: Soft. Normal appearance and bowel sounds are normal. There is no hepatosplenomegaly. There is no abdominal tenderness. There is no rigidity and no guarding.  Abdomen is soft, without obvious tenderness  Musculoskeletal:     Comments: Chronic contractures, unable to lie flat for exam  Neurological: She is alert.  Skin: Skin is warm, dry and intact.  Nursing note and  vitals reviewed.    Results for orders placed or performed during the hospital encounter of 10/28/18 (from the past 48 hour(s))  Culture, blood (routine x 2)     Status: None (Preliminary result)   Collection Time: 10/29/18 12:39 PM  Result Value Ref Range   Specimen Description BLOOD LEFT WRIST    Special Requests      BOTTLES DRAWN AEROBIC ONLY Blood Culture adequate volume   Culture      NO GROWTH 2 DAYS Performed at [facility], [address] [place], [place] [address]    Report Status PENDING   Culture, blood (routine x 2)     Status: None (Preliminary result)   Collection Time: 10/29/18 12:39 PM  Result Value Ref Range   Specimen Description BLOOD LEFT HAND    Special Requests      BOTTLES DRAWN AEROBIC ONLY Blood Culture adequate volume   Culture  NO GROWTH 2 DAYS Performed at [facility], [address], [place], [place] [address]    Report Status PENDING   CBC     Status: None   Collection Time: 10/30/18  4:54 AM  Result Value Ref Range   WBC 9.8 4.0 - 10.5 K/uL   RBC 4.22 3.87 - 5.11 MIL/uL   Hemoglobin 12.8 12.0 - 15.0 g/dL   HCT 15.1 76.1 - 60.7 %   MCV 93.8 80.0 - 100.0 fL   MCH 30.3 26.0 - 34.0 pg   MCHC 32.3 30.0 - 36.0 g/dL   RDW 37.1 06.2 - 69.4 %   Platelets 230 150 - 400 K/uL   nRBC 0.0 0.0 - 0.2 %    Comment: Performed at [facility], [address] [place], [place] [address]  Basic metabolic panel     Status: Abnormal   Collection Time: 10/30/18  4:54 AM  Result Value Ref Range   Sodium 142 135 - 145 mmol/L   Potassium 3.2 (L) 3.5 - 5.1 mmol/L   Chloride 110 98 - 111 mmol/L   CO2 21 (L) 22 - 32 mmol/L   Glucose, Bld 96 70 - 99 mg/dL   BUN 19 8 - 23 mg/dL   Creatinine, Ser 7.03 (H) 0.44 - 1.00 mg/dL   Calcium 8.9 8.9 - 50.0 mg/dL   GFR calc non Af Amer 43 (L) >60 mL/min   GFR calc Af Amer 50 (L) >60 mL/min   Anion gap 11 5 - 15    Comment: Performed at [facility], [address] [place], [place] [address]   CBC     Status: Abnormal   Collection Time: 10/31/18  7:29 AM  Result Value Ref Range   WBC 11.7 (H) 4.0 - 10.5 K/uL   RBC 4.43 3.87 - 5.11 MIL/uL   Hemoglobin 12.8 12.0 - 15.0 g/dL   HCT 29.9 37.1 - 69.6 %   MCV 93.2 80.0 - 100.0 fL   MCH 28.9 26.0 - 34.0 pg   MCHC 31.0 30.0 - 36.0 g/dL   RDW 78.9 38.1 - 01.7 %   Platelets 209 150 - 400 K/uL   nRBC 0.0 0.0 - 0.2 %    Comment: Performed at [facility], [address] [place], [place] [address]  Basic metabolic panel     Status: Abnormal   Collection Time: 10/31/18  7:29 AM  Result Value Ref Range   Sodium 141 135 - 145 mmol/L   Potassium 3.6 3.5 - 5.1 mmol/L   Chloride 110 98 - 111 mmol/L   CO2 20 (L) 22 - 32 mmol/L   Glucose, Bld 125 (H) 70 - 99 mg/dL   BUN 19 8 - 23 mg/dL   Creatinine, Ser 8.52 (H) 0.44 - 1.00 mg/dL   Calcium 8.7 (L) 8.9 - 10.3 mg/dL   GFR calc non Af Amer 40 (L) >60 mL/min   GFR calc Af Amer 46 (L) >60 mL/min   Anion gap 11 5 - 15    Comment: Performed at [facility], [address], [place], [place] [address]  CBC     Status: Abnormal   Collection Time: 10/31/18  8:46 AM  Result Value Ref Range   WBC 11.9 (H) 4.0 - 10.5 K/uL   RBC 4.26 3.87 - 5.11 MIL/uL   Hemoglobin 12.8 12.0 - 15.0 g/dL   HCT 23.5 36.1 - 44.3 %   MCV 91.8 80.0 - 100.0  fL   MCH 30.0 26.0 - 34.0 pg   MCHC 32.7 30.0 - 36.0 g/dL   RDW 94.1 74.0 - 81.4 %   Platelets 201 150 - 400 K/uL   nRBC 0.0 0.0 - 0.2 %    Comment: Performed at [facility], [address] [place], [place] [address]  Lipase, blood     Status: None   Collection Time: 10/31/18  8:46 AM  Result Value Ref Range   Lipase 42 11 - 51 U/L    Comment: Performed at [facility], [address], [place], [place] [address]   Ct Abdomen Pelvis Wo Contrast  Result Date: 10/31/2018 CLINICAL DATA:  Nausea and vomiting EXAM: CT ABDOMEN AND PELVIS WITHOUT CONTRAST TECHNIQUE: Multidetector CT imaging of the abdomen and pelvis was performed following  the standard protocol without IV contrast. COMPARISON:  08/16/2017 FINDINGS: Lower chest: Lung bases demonstrate patchy infiltrate throughout the right middle and lower lobe. Mild atelectatic changes are noted in the bases bilaterally. Hepatobiliary: Gallbladder is well distended. Some dependent density is noted which may be related to gallbladder sludge. No definitive calculi are seen. The liver is within normal limits. Pancreas: Unremarkable. No pancreatic ductal dilatation or surrounding inflammatory changes. Spleen: Normal in size without focal abnormality. Adrenals/Urinary Tract: Adrenal glands are within normal limits. Kidneys are well visualized bilaterally. No renal calculi or obstructive changes are seen. Scattered cysts are noted within the left kidney similar to that noted on prior exam. No obstructive changes are seen. The bladder is decompressed. Stomach/Bowel: The appendix is not well visualized. No inflammatory changes are seen. The colon is decompressed. Multiple dilated loops of small bowel are noted involving the mid to distal jejunum and proximal ileum. There is a transition zone identified in the right mid abdomen best seen on image number 46 of series 3. Additionally some swirling of the vasculature is seen suggestive of internal hernia. The stomach is within normal limits. 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 No significant lymphadenopathy is noted. Reproductive: Status post hysterectomy. No adnexal masses. Other: Minimal free fluid is noted within the pelvis likely reactive in nature. No free air is seen. Musculoskeletal: Degenerative changes of lumbar spine are noted. IMPRESSION: Findings consistent with small bowel obstruction with a transition zone in the right mid abdomen as described. Some associated swirling of vasculature is noted suspicious for  internal hernia. 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 Patchy right middle and right lower lobe acute infiltrate. Changes suggestive of gallbladder sludge. These results will be called to the ordering clinician or representative by the Radiologist Assistant, and communication documented in the PACS or zVision Dashboard. Electronically Signed   By: Alcide Clever M.D.   On: 10/31/2018 10:23   Dg Chest Port 1v Same Day  Result Date: 10/31/2018 CLINICAL DATA:  Shortness of breath.  Concern for aspiration. EXAM: PORTABLE CHEST 1 VIEW COMPARISON:  Chest x-ray from yesterday. FINDINGS: Stable cardiomediastinal silhouette with grossly unchanged ascending thoracic aorta pseudoaneurysm, accounting for the bulging contour along the right heart border. Normal pulmonary vascularity. No focal consolidation, pleural effusion, or pneumothorax. Linear atelectasis in the peripheral lingula. No acute osseous abnormality. IMPRESSION: 1. No active disease. 2. 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 Electronically Signed   By: Obie Dredge M.D.   On: 10/31/2018 08:49   Dg  Chest Port 1v Same Day  Result Date: 10/30/2018 CLINICAL DATA:  Shortness of breath. EXAM: PORTABLE CHEST 1 VIEW COMPARISON:  Chest x-rays dated 10/28/2018, 11/09/2017 and 08/11/2017. FINDINGS: Stable cardiomegaly. Overall cardiomediastinal silhouette appears stable in size and configuration. Atherosclerotic changes again noted at the aortic arch. Lungs are clear. No pleural effusion or pneumothorax seen. Median sternotomy wires appear intact and stable in alignment. Osseous structures about the chest are unremarkable. IMPRESSION: 1. No active disease. No evidence of pneumonia or pulmonary edema. 2. Stable cardiomegaly. 3. Aortic atherosclerosis. Electronically Signed   By: Bary Richard M.D.   On: 10/30/2018 15:07      Assessment/Plan Dementia, non-verbal at baseline Hx  of CVA HTN  SBO with concerns on CT for internal hernia - Dr. Cliffton Asters discussed case with patient's husband via phone. He understands there could be compromised bowel that could worsen and the patient may worsen from this and could lead to death. The patient's husband would like to hold off on surgery and would pursue non-operative management. - Will place on SBO protocol - We will follow  Jacinto Halim, [facility] 10/31/2018, 11:23 AM Pager: [phone]

## 2018-10-31 NOTE — Progress Notes (Signed)
SLP Cancellation Note  Patient Details Name: Amanda [name] MRN: [id number] DOB: 1946/04/29   Cancelled treatment:       Reason Eval/Treat Not Completed: Other (comment)(note pt with probable small bowel obstruction, will defer )   Chales Abrahams 10/31/2018, 10:59 AM  Donavan Burnet, MS [facility] SLP Acute Rehab Services Pager [phone] Office [phone]

## 2018-10-31 NOTE — Consult Note (Addendum)
WOC Nurse wound consult note Reason for Consult: Consult requested for buttocks and sacrum.  Pt has darker-colored skin to these locations; affected area is approx 20X20cm.  Loose macerated skin peels easily and revealing patchy areas of partial thickness skin loss; pink and moist. Wound type:  Appearance is consistent with moisture associated skin damage and possible candidiasis. Grey macerated skin to inner gluteal fold  related to moisture and maceration.  Dressing procedure/placement/frequency: Barrier cream and antifungal powder to protect and promote healing. Please re-consult if further assistance is needed.  Thank-you,  Cammie Mcgee MSN, RN, CWOCN, [place], CNS [phone]

## 2018-10-31 NOTE — Plan of Care (Signed)
  Problem: Education: Goal: Knowledge of General Education information will improve Description Including pain rating scale, medication(s)/side effects and non-pharmacologic comfort measures Outcome: Progressing   Problem: Education: Goal: Knowledge of General Education information will improve Description Including pain rating scale, medication(s)/side effects and non-pharmacologic comfort measures Outcome: Progressing   

## 2018-10-31 NOTE — Progress Notes (Signed)
PROGRESS NOTE        PATIENT DETAILS Name: Amanda [name] Age: 73 y.o. Sex: female Date of Birth: [birth year] Admit Date: 10/28/2018 Admitting Physician Starleen Arms, MD BOF:[id number], Titus Dubin, MD  Brief Narrative: Patient is a 73 y.o. female with history of CVA, dementia, s/p aortic aneurysm repair-Per H&P-nonverbal at baseline-resident of SNF-presented to the ED for evaluation of fever, vomiting-initially thought to have MSSA bacteremia-however further culture results are positive for coag negative staph bacteremia.  Subsequently on 3/23-started having projectile vomiting-CT abdomen showing small bowel obstruction.  See below for further details found to have MSSA bacteremia.  See below for further details.  Subjective: Has vomited numerous times this morning-suspect is aspirated as sounds incredibly rhonchorous.  Assessment/Plan: Sepsis: Initially thought to have MSSA bacteremia-based on BCID cultures-however further culture this morning is suggestive of coagulase negative staph one of 1/2 cultures drawn on admission.  Repeat blood cultures on 3/21- so far.  Suspect that this may be a contamination-no fever since admission-no other foci evident at this point.  Ancef was discontinued on 3/22-sepsis pathophysiology has resolved-unfortunately it appears that the patient may have aspirated vomitus this morning-and has now been started on Zosyn.  Aspiration pneumonia: Vomiting-she is nonverbal-and is very frail-she sounds incredibly rhonchorous-and has transmitted upper airway sounds-I suspect she has aspirated-and hence have started Zosyn.  Small bowel obstruction: Vomited numerous times this morning-very poor historian-nonverbal-does not follow commands-is bedbound and contracted.  Underwent a CT scan on 3/23 that showed SBO with a transition point-and possibly internal hernia.  Please NG tube-keep n.p.o.-General surgery has been consulted.  AKI on CKD stage  III: AKI hemodynamically mediated-significantly improved with supportive care.    Acute metabolic encephalopathy: Secondary to sepsis and MSSA bacteremia along with AKI.  No family at bedside--she is nonverbal and is contracted/nonambulatory at baseline and suspected current mental status is at her baseline.  Mild hypernatremia: Improved.  Hypertension: Controlled-continue with clonidine, labetalol,  Stage II sacral ulcer: Present prior to [facility] care evaluation.  Dementia/CVA (nonverbal): resident of SNF-suspect she is at baseline-await arrival of family members.  DVT Prophylaxis: Prophylactic Heparin  Code Status: Full code  Family Communication: Left message for spouse to call back.  Disposition Plan: Remain inpatient- SNF on discharge  Antimicrobial agents: Anti-infectives (From admission, onward)   Start     Dose/Rate Route Frequency Ordered Stop   10/31/18 0900  piperacillin-tazobactam (ZOSYN) IVPB 3.375 g     3.375 g 12.5 mL/hr over 240 Minutes Intravenous Every 8 hours 10/31/18 0830     10/29/18 1000  ceFAZolin (ANCEF) IVPB 2g/100 mL premix  Status:  Discontinued     2 g 200 mL/hr over 30 Minutes Intravenous Every 12 hours 10/29/18 0911 10/30/18 1222   10/28/18 1330  cefTRIAXone (ROCEPHIN) 2 g in sodium chloride 0.9 % 100 mL IVPB  Status:  Discontinued     2 g 200 mL/hr over 30 Minutes Intravenous  Once 10/28/18 1316 10/28/18 1317   10/28/18 1330  vancomycin (VANCOCIN) IVPB 1000 mg/200 mL premix     1,000 mg 200 mL/hr over 60 Minutes Intravenous  Once 10/28/18 1318 10/28/18 1454   10/28/18 1330  ceFEPIme (MAXIPIME) 1 g in sodium chloride 0.9 % 100 mL IVPB  Status:  Discontinued     1 g 200 mL/hr over 30 Minutes Intravenous Every 24 hours 10/28/18 1326 10/29/18  3532   10/28/18 1318  vancomycin variable dose per unstable renal function (pharmacist dosing)  Status:  Discontinued      Does not apply See admin instructions 10/28/18 1318 10/29/18 0911       Procedures: None  CONSULTS:  None  Time spent: 35- minutes-Greater than 50% of this time was spent in counseling, explanation of diagnosis, planning of further management, and coordination of care.  MEDICATIONS: Scheduled Meds: . calcium-vitamin D   Oral Daily  . cloNIDine  0.1 mg Oral BID  . feeding supplement (ENSURE ENLIVE)   Oral BID  . heparin  5,000 Units Subcutaneous Q8H  . labetalol  300 mg Oral BID  . multivitamin with minerals  1 tablet Oral Daily  . senna-docusate  1 tablet Oral BID   Continuous Infusions: . sodium chloride 100 mL/hr at 10/31/18 0909  . piperacillin-tazobactam (ZOSYN)  IV 3.375 g (10/31/18 0907)   PRN Meds:.acetaminophen **OR** acetaminophen, albuterol, bisacodyl, hydrALAZINE, magnesium hydroxide, ondansetron **OR** ondansetron (ZOFRAN) IV, Resource ThickenUp Clear   PHYSICAL EXAM: Vital signs: Vitals:   10/30/18 1435 10/30/18 2142 10/31/18 0502 10/31/18 0514  BP: (!) 155/89 135/74 136/79   Pulse: 65 64 68   Resp:      Temp: 98.1 F (36.7 C) 98.3 F (36.8 C) 98.1 F (36.7 C)   TempSrc: Oral Oral Oral   SpO2: 99% 99% (!) 88% 90%  Weight:      Height:       Filed Weights   10/28/18 0959 10/28/18 1530  Weight: 50.9 kg 45.5 kg   Body mass index is 18.95 kg/m.   General appearance: Awake-nonverbal-slightly tachypneic-very rhonchorous Eyes:no scleral icterus. HEENT: Atraumatic and Normocephalic Neck: supple, no JVD. Resp:Good air entry bilaterally, very rhonchorous-transmitted upper airway sounds CVS: S1 S2 regular, no murmurs.  GI: Soft-nontender-nondistended Extremities: B/L Lower Ext shows no edema, both legs are warm to touch Neurology: Contracted-has bilateral upper extremity contractures-right lower extremity also appears to be contracted. Musculoskeletal:No digital cyanosis Skin:No Rash, warm and dry Wounds:N/A  I have personally reviewed following labs and imaging studies  LABORATORY DATA: CBC: Recent Labs  Lab 10/28/18  1020 10/29/18 0539 10/30/18 0454 10/31/18 0729 10/31/18 0846  WBC 13.4* 13.4* 9.8 11.7* 11.9*  NEUTROABS 10.8*  --   --   --   --   HGB 14.4 13.8 12.8 12.8 12.8  HCT 47.2* 43.9 39.6 41.3 39.1  MCV 95.2 94.2 93.8 93.2 91.8  PLT 260 209 230 209 201    Basic Metabolic Panel: Recent Labs  Lab 10/28/18 1020 10/29/18 0539 10/30/18 0454 10/31/18 0729  NA 147* 148* 142 141  K 4.0 3.6 3.2* 3.6  CL 112* 114* 110 110  CO2 24 22 21* 20*  GLUCOSE 133* 118* 96 125*  BUN 35* 27* 19 19  CREATININE 2.36* 1.56* 1.23* 1.31*  CALCIUM 10.2 9.5 8.9 8.7*    GFR: Estimated Creatinine Clearance: 26.7 mL/min (A) (by C-G formula based on SCr of 1.31 mg/dL (H)).  Liver Function Tests: Recent Labs  Lab 10/28/18 1020  AST 24  ALT 22  ALKPHOS 75  BILITOT 0.8  PROT 8.3*  ALBUMIN 3.8   Recent Labs  Lab 10/31/18 0846  LIPASE 42   No results for input(s): AMMONIA in the last 168 hours.  Coagulation Profile: No results for input(s): INR, PROTIME in the last 168 hours.  Cardiac Enzymes: No results for input(s): CKTOTAL, CKMB, CKMBINDEX, TROPONINI in the last 168 hours.  BNP (last 3 results) No results for  input(s): PROBNP in the last 8760 hours.  HbA1C: No results for input(s): HGBA1C in the last 72 hours.  CBG: No results for input(s): GLUCAP in the last 168 hours.  Lipid Profile: No results for input(s): CHOL, HDL, LDLCALC, TRIG, CHOLHDL, LDLDIRECT in the last 72 hours.  Thyroid Function Tests: No results for input(s): TSH, T4TOTAL, FREET4, T3FREE, THYROIDAB in the last 72 hours.  Anemia Panel: No results for input(s): VITAMINB12, FOLATE, FERRITIN, TIBC, IRON, RETICCTPCT in the last 72 hours.  Urine analysis:    Component Value Date/Time   COLORURINE AMBER (A) 10/28/2018 1215   APPEARANCEUR CLOUDY (A) 10/28/2018 1215   LABSPEC 1.029 10/28/2018 1215   PHURINE 5.0 10/28/2018 1215   GLUCOSEU NEGATIVE 10/28/2018 1215   HGBUR NEGATIVE 10/28/2018 1215   BILIRUBINUR  NEGATIVE 10/28/2018 1215   KETONESUR 20 (A) 10/28/2018 1215   PROTEINUR 30 (A) 10/28/2018 1215   UROBILINOGEN 0.2 05/17/2012 1424   NITRITE NEGATIVE 10/28/2018 1215   LEUKOCYTESUR NEGATIVE 10/28/2018 1215    Sepsis Labs: Lactic Acid, Venous    Component Value Date/Time   LATICACIDVEN 1.4 10/28/2018 1020    MICROBIOLOGY: Recent Results (from the past 240 hour(s))  Blood Culture (routine x 2)     Status: None (Preliminary result)   Collection Time: 10/28/18 10:08 AM  Result Value Ref Range Status   Specimen Description BLOOD LEFT HAND  Final   Special Requests   Final    BOTTLES DRAWN AEROBIC ONLY Blood Culture results may not be optimal due to an inadequate volume of blood received in culture bottles   Culture   Final    NO GROWTH 3 DAYS Performed at [facility], [address] [place], [place] [address]    Report Status PENDING  Incomplete  Blood Culture (routine x 2)     Status: Abnormal   Collection Time: 10/28/18 10:20 AM  Result Value Ref Range Status   Specimen Description BLOOD LEFT HAND  Final   Special Requests   Final    BOTTLES DRAWN AEROBIC AND ANAEROBIC Blood Culture adequate volume   Culture  Setup Time   Final    GRAM POSITIVE COCCI ANAEROBIC BOTTLE ONLY CRITICAL RESULT CALLED TO, READ BACK BY AND VERIFIED WITH: PHARMD ANNA LOVE [id number] FCP    Culture (A)  Final    STAPHYLOCOCCUS SPECIES (COAGULASE NEGATIVE) THE SIGNIFICANCE OF ISOLATING THIS ORGANISM FROM A SINGLE SET OF BLOOD CULTURES WHEN MULTIPLE SETS ARE DRAWN IS UNCERTAIN. PLEASE NOTIFY THE MICROBIOLOGY DEPARTMENT WITHIN ONE WEEK IF SPECIATION AND SENSITIVITIES ARE REQUIRED. Performed at [facility], [address] [place], [place] [address]    Report Status 10/31/2018 FINAL  Final  Blood Culture ID Panel (Reflexed)     Status: Abnormal   Collection Time: 10/28/18 10:20 AM  Result Value Ref Range Status   Enterococcus species NOT DETECTED NOT DETECTED Final   Listeria  monocytogenes NOT DETECTED NOT DETECTED Final   Staphylococcus species DETECTED (A) NOT DETECTED Final    Comment: Methicillin (oxacillin) susceptible coagulase negative staphylococcus. Possible blood culture contaminant (unless isolated from more than one blood culture draw or clinical case suggests pathogenicity). No antibiotic treatment is indicated for blood  culture contaminants. CRITICAL RESULT CALLED TO, READ BACK BY AND VERIFIED WITH: PHARMD ANNA LOVE [phone] [id number] FCP    Staphylococcus aureus (BCID) NOT DETECTED NOT DETECTED Final   Methicillin resistance NOT DETECTED NOT DETECTED Final   Streptococcus species NOT DETECTED NOT DETECTED Final   Streptococcus  agalactiae NOT DETECTED NOT DETECTED Final   Streptococcus pneumoniae NOT DETECTED NOT DETECTED Final   Streptococcus pyogenes NOT DETECTED NOT DETECTED Final   Acinetobacter baumannii NOT DETECTED NOT DETECTED Final   Enterobacteriaceae species NOT DETECTED NOT DETECTED Final   Enterobacter cloacae complex NOT DETECTED NOT DETECTED Final   Escherichia coli NOT DETECTED NOT DETECTED Final   Klebsiella oxytoca NOT DETECTED NOT DETECTED Final   Klebsiella pneumoniae NOT DETECTED NOT DETECTED Final   Proteus species NOT DETECTED NOT DETECTED Final   Serratia marcescens NOT DETECTED NOT DETECTED Final   Haemophilus influenzae NOT DETECTED NOT DETECTED Final   Neisseria meningitidis NOT DETECTED NOT DETECTED Final   Pseudomonas aeruginosa NOT DETECTED NOT DETECTED Final   Candida albicans NOT DETECTED NOT DETECTED Final   Candida glabrata NOT DETECTED NOT DETECTED Final   Candida krusei NOT DETECTED NOT DETECTED Final   Candida parapsilosis NOT DETECTED NOT DETECTED Final   Candida tropicalis NOT DETECTED NOT DETECTED Final    Comment: Performed at [facility], [address], [place], [place] [address]  Urine culture     Status: Abnormal   Collection Time: 10/28/18 12:15 PM  Result Value Ref Range Status   Specimen  Description URINE, CLEAN CATCH  Final   Special Requests NONE  Final   Culture (A)  Final    <10,000 COLONIES/mL INSIGNIFICANT GROWTH Performed at [facility], [address] [place], [place] [address]    Report Status 10/29/2018 FINAL  Final  MRSA PCR Screening     Status: None   Collection Time: 10/28/18  5:58 PM  Result Value Ref Range Status   MRSA by PCR NEGATIVE NEGATIVE Final    Comment:        The GeneXpert MRSA Assay (FDA approved for NASAL specimens only), is one component of a comprehensive MRSA colonization surveillance program. It is not intended to diagnose MRSA infection nor to guide or monitor treatment for MRSA infections. Performed at [facility], [address], [place], [place] [address]   Culture, blood (routine x 2)     Status: None (Preliminary result)   Collection Time: 10/29/18 12:39 PM  Result Value Ref Range Status   Specimen Description BLOOD LEFT WRIST  Final   Special Requests   Final    BOTTLES DRAWN AEROBIC ONLY Blood Culture adequate volume   Culture   Final    NO GROWTH 2 DAYS Performed at [facility], [address], [place], [place] [address]    Report Status PENDING  Incomplete  Culture, blood (routine x 2)     Status: None (Preliminary result)   Collection Time: 10/29/18 12:39 PM  Result Value Ref Range Status   Specimen Description BLOOD LEFT HAND  Final   Special Requests   Final    BOTTLES DRAWN AEROBIC ONLY Blood Culture adequate volume   Culture   Final    NO GROWTH 2 DAYS Performed at [facility], [address], [place], [place] [address]    Report Status PENDING  Incomplete    RADIOLOGY STUDIES/RESULTS: Ct Abdomen Pelvis Wo Contrast  Result Date: 10/31/2018 CLINICAL DATA:  Nausea and vomiting EXAM: CT ABDOMEN AND PELVIS WITHOUT CONTRAST TECHNIQUE: Multidetector CT imaging of the abdomen and pelvis was performed following the standard protocol without IV contrast. COMPARISON:  08/16/2017  FINDINGS: Lower chest: Lung bases demonstrate patchy infiltrate throughout the right middle and lower lobe. Mild atelectatic changes are noted in  the bases bilaterally. Hepatobiliary: Gallbladder is well distended. Some dependent density is noted which may be related to gallbladder sludge. No definitive calculi are seen. The liver is within normal limits. Pancreas: Unremarkable. No pancreatic ductal dilatation or surrounding inflammatory changes. Spleen: Normal in size without focal abnormality. Adrenals/Urinary Tract: Adrenal glands are within normal limits. Kidneys are well visualized bilaterally. No renal calculi or obstructive changes are seen. Scattered cysts are noted within the left kidney similar to that noted on prior exam. No obstructive changes are seen. The bladder is decompressed. Stomach/Bowel: The appendix is not well visualized. No inflammatory changes are seen. The colon is decompressed. Multiple dilated loops of small bowel are noted involving the mid to distal jejunum and proximal ileum. There is a transition zone identified in the right mid abdomen best seen on image number 46 of series 3. Additionally some swirling of the vasculature is seen suggestive of internal hernia. The stomach is within normal limits. 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 No significant lymphadenopathy is noted. Reproductive: Status post hysterectomy. No adnexal masses. Other: Minimal free fluid is noted within the pelvis likely reactive in nature. No free air is seen. Musculoskeletal: Degenerative changes of lumbar spine are noted. IMPRESSION: Findings consistent with small bowel obstruction with a transition zone in the right mid abdomen as described. Some associated swirling of vasculature is noted suspicious for internal hernia. 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 Patchy right middle and right lower lobe acute infiltrate. Changes suggestive of gallbladder sludge. These results will be called to the ordering clinician or representative by the Radiologist Assistant, and communication documented in the PACS or zVision Dashboard. Electronically Signed   By: Alcide Clever M.D.   On: 10/31/2018 10:23   Dg Chest Port 1 View  Result Date: 10/28/2018 CLINICAL DATA:  Fever.  Altered mental status. EXAM: PORTABLE CHEST 1 VIEW COMPARISON:  11/09/2017. FINDINGS: Prior CABG. Cardiomegaly. No pulmonary venous congestion. No focal infiltrate. No pleural effusion or pneumothorax. No acute bony abnormality. IMPRESSION: 1.  Prior CABG.  Cardiomegaly.  No pulmonary venous congestion. 2.  No focal infiltrate. Electronically Signed   By: Maisie Fus  Register   On: 10/28/2018 11:43   Dg Chest Port 1v Same Day  Result Date: 10/31/2018 CLINICAL DATA:  Shortness of breath.  Concern for aspiration. EXAM: PORTABLE CHEST 1 VIEW COMPARISON:  Chest x-ray from yesterday. FINDINGS: Stable cardiomediastinal silhouette with grossly unchanged ascending thoracic aorta pseudoaneurysm, accounting for the bulging contour along the right heart border. Normal pulmonary vascularity. No focal consolidation, pleural effusion, or pneumothorax. Linear atelectasis in the peripheral lingula. No acute osseous abnormality. IMPRESSION: 1. No active disease. 2. 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 Electronically Signed   By: Obie Dredge M.D.   On: 10/31/2018 08:49   Dg Chest Port 1v Same Day  Result Date: 10/30/2018 CLINICAL DATA:  Shortness of breath. EXAM: PORTABLE CHEST 1 VIEW COMPARISON:  Chest x-rays dated 10/28/2018, 11/09/2017 and 08/11/2017. FINDINGS: Stable cardiomegaly. Overall cardiomediastinal silhouette appears stable in size and configuration. Atherosclerotic changes again noted at the aortic arch. Lungs are clear. No  pleural effusion or pneumothorax seen. Median sternotomy wires appear intact and stable in alignment. Osseous structures about the chest are unremarkable. IMPRESSION: 1. No active disease. No evidence of pneumonia or pulmonary edema. 2. Stable cardiomegaly. 3. Aortic atherosclerosis. Electronically Signed  By: Bary Richard M.D.   On: 10/30/2018 15:07     LOS: 2 days   Jeoffrey Massed, MD  [facility]  If 7PM-7AM, please contact night-coverage  Please page via [email or web address]  Go to [email or web address] and use [facility] universal password to access. If you do not have the password, please contact the hospital operator.  Locate the [facility] provider you are looking for under [facility] and page to a number that you can be directly reached. If you still have difficulty reaching the provider, please page the [facility] (Director on Call) for the Hospitalists listed on amion for assistance.  10/31/2018, 10:51 AM

## 2018-10-31 NOTE — Progress Notes (Signed)
Patient vomited coffee ground emesis.  MD aware and ordered new med's and a CT of the abdomin.

## 2018-11-01 ENCOUNTER — Encounter (HOSPITAL_COMMUNITY): Payer: Self-pay | Admitting: Primary Care

## 2018-11-01 ENCOUNTER — Inpatient Hospital Stay (HOSPITAL_COMMUNITY): Payer: Medicare Other

## 2018-11-01 DIAGNOSIS — Z7189 Other specified counseling: Secondary | ICD-10-CM

## 2018-11-01 DIAGNOSIS — Z515 Encounter for palliative care: Secondary | ICD-10-CM

## 2018-11-01 DIAGNOSIS — K56609 Unspecified intestinal obstruction, unspecified as to partial versus complete obstruction: Secondary | ICD-10-CM

## 2018-11-01 LAB — CBC
HCT: 36.5 % (ref 36.0–46.0)
Hemoglobin: 11.8 g/dL — ABNORMAL LOW (ref 12.0–15.0)
MCH: 29.6 pg (ref 26.0–34.0)
MCHC: 32.3 g/dL (ref 30.0–36.0)
MCV: 91.5 fL (ref 80.0–100.0)
PLATELETS: 233 10*3/uL (ref 150–400)
RBC: 3.99 MIL/uL (ref 3.87–5.11)
RDW: 13.2 % (ref 11.5–15.5)
WBC: 13.9 10*3/uL — ABNORMAL HIGH (ref 4.0–10.5)
nRBC: 0 % (ref 0.0–0.2)

## 2018-11-01 LAB — BASIC METABOLIC PANEL
Anion gap: 16 — ABNORMAL HIGH (ref 5–15)
BUN: 26 mg/dL — ABNORMAL HIGH (ref 8–23)
CO2: 20 mmol/L — ABNORMAL LOW (ref 22–32)
Calcium: 8.9 mg/dL (ref 8.9–10.3)
Chloride: 104 mmol/L (ref 98–111)
Creatinine, Ser: 1.71 mg/dL — ABNORMAL HIGH (ref 0.44–1.00)
GFR calc Af Amer: 33 mL/min — ABNORMAL LOW (ref >60)
GFR calc non Af Amer: 29 mL/min — ABNORMAL LOW (ref >60)
Glucose, Bld: 104 mg/dL — ABNORMAL HIGH (ref 70–99)
Potassium: 3 mmol/L — ABNORMAL LOW (ref 3.5–5.1)
Sodium: 140 mmol/L (ref 135–145)

## 2018-11-01 LAB — RSV(RESPIRATORY SYNCYTIAL VIRUS) AB, BLOOD: RSV Ab: 1:8 {titer} — ABNORMAL HIGH

## 2018-11-01 MED ORDER — CLONIDINE HCL 0.2 MG/24HR TD PTWK
0.2000 mg | MEDICATED_PATCH | TRANSDERMAL | Status: DC
  Administered 2018-11-01: 0.2 mg via TRANSDERMAL
  Filled 2018-11-01: qty 1

## 2018-11-01 MED ORDER — POTASSIUM CHLORIDE 10 MEQ/100ML IV SOLN
10.0000 meq | INTRAVENOUS | Status: AC
  Administered 2018-11-01 (×4): 10 meq via INTRAVENOUS
  Filled 2018-11-01 (×4): qty 100

## 2018-11-01 NOTE — Progress Notes (Signed)
Subjective: CC: Resting comfortably  Objective: Vital signs in last 24 hours: Temp:  [98.2 F (36.8 C)-99 F (37.2 C)] 99 F (37.2 C) (03/24 0454) Pulse Rate:  [73-96] 96 (03/24 0454) Resp:  [20] 20 (03/23 1353) BP: (128-158)/(80-87) 158/87 (03/24 0454) SpO2:  [93 %-98 %] 95 % (03/24 0454) Last BM Date: (PTA)  Intake/Output from previous day: 03/23 0701 - 03/24 0700 In: 320.8 [I.V.:220.8; IV Piggyback:100] Out: 4.2 [Emesis/NG output:4.2] Intake/Output this shift: Total I/O In: -  Out: 300 [Emesis/NG output:300]  PE: Gen: resting comfortably. Easily awoken Heart: Regular Lungs: Normal effort Abd: Mild distension, soft, no obvious tenderness or grimacing with palpation. No rigidity or guarding . Hypoactive bowel sounds Msk: Chronic contractures of upper and lower extremities. Unable to lie flat 2/2 to this.   Lab Results:  Recent Labs    10/31/18 0729 10/31/18 0846  WBC 11.7* 11.9*  HGB 12.8 12.8  HCT 41.3 39.1  PLT 209 201   BMET Recent Labs    10/30/18 0454 10/31/18 0729  NA 142 141  K 3.2* 3.6  CL 110 110  CO2 21* 20*  GLUCOSE 96 125*  BUN 19 19  CREATININE 1.23* 1.31*  CALCIUM 8.9 8.7*   PT/INR No results for input(s): LABPROT, INR in the last 72 hours. CMP     Component Value Date/Time   NA 141 10/31/2018 0729   NA 143 12/17/2016   K 3.6 10/31/2018 0729   CL 110 10/31/2018 0729   CO2 20 (L) 10/31/2018 0729   GLUCOSE 125 (H) 10/31/2018 0729   BUN 19 10/31/2018 0729   BUN 18 12/17/2016   CREATININE 1.31 (H) 10/31/2018 0729   CALCIUM 8.7 (L) 10/31/2018 0729   PROT 8.3 (H) 10/28/2018 1020   ALBUMIN 3.8 10/28/2018 1020   AST 24 10/28/2018 1020   ALT 22 10/28/2018 1020   ALKPHOS 75 10/28/2018 1020   BILITOT 0.8 10/28/2018 1020   GFRNONAA 40 (L) 10/31/2018 0729   GFRAA 46 (L) 10/31/2018 0729   Lipase     Component Value Date/Time   LIPASE 42 10/31/2018 0846       Studies/Results: Ct Abdomen Pelvis Wo Contrast  Result  Date: 10/31/2018 CLINICAL DATA:  Nausea and vomiting EXAM: CT ABDOMEN AND PELVIS WITHOUT CONTRAST TECHNIQUE: Multidetector CT imaging of the abdomen and pelvis was performed following the standard protocol without IV contrast. COMPARISON:  08/16/2017 FINDINGS: Lower chest: Lung bases demonstrate patchy infiltrate throughout the right middle and lower lobe. Mild atelectatic changes are noted in the bases bilaterally. Hepatobiliary: Gallbladder is well distended. Some dependent density is noted which may be related to gallbladder sludge. No definitive calculi are seen. The liver is within normal limits. Pancreas: Unremarkable. No pancreatic ductal dilatation or surrounding inflammatory changes. Spleen: Normal in size without focal abnormality. Adrenals/Urinary Tract: Adrenal glands are within normal limits. Kidneys are well visualized bilaterally. No renal calculi or obstructive changes are seen. Scattered cysts are noted within the left kidney similar to that noted on prior exam. No obstructive changes are seen. The bladder is decompressed. Stomach/Bowel: The appendix is not well visualized. No inflammatory changes are seen. The colon is decompressed. Multiple dilated loops of small bowel are noted involving the mid to distal jejunum and proximal ileum. There is a transition zone identified in the right mid abdomen best seen on image number 46 of series 3. Additionally some swirling of the vasculature is seen suggestive of internal hernia. The stomach is within normal  limits. 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 No significant lymphadenopathy is noted. Reproductive: Status post hysterectomy. No adnexal masses. Other: Minimal free fluid is noted within the pelvis likely reactive in nature. No free air is seen. Musculoskeletal: Degenerative changes of lumbar spine are noted.  IMPRESSION: Findings consistent with small bowel obstruction with a transition zone in the right mid abdomen as described. Some associated swirling of vasculature is noted suspicious for internal hernia. 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 Patchy right middle and right lower lobe acute infiltrate. Changes suggestive of gallbladder sludge. These results will be called to the ordering clinician or representative by the Radiologist Assistant, and communication documented in the PACS or zVision Dashboard. Electronically Signed   By: Alcide Clever M.D.   On: 10/31/2018 10:23   Dg Chest Port 1v Same Day  Result Date: 10/31/2018 CLINICAL DATA:  Shortness of breath.  Concern for aspiration. EXAM: PORTABLE CHEST 1 VIEW COMPARISON:  Chest x-ray from yesterday. FINDINGS: Stable cardiomediastinal silhouette with grossly unchanged ascending thoracic aorta pseudoaneurysm, accounting for the bulging contour along the right heart border. Normal pulmonary vascularity. No focal consolidation, pleural effusion, or pneumothorax. Linear atelectasis in the peripheral lingula. No acute osseous abnormality. IMPRESSION: 1. No active disease. 2. 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 Electronically Signed   By: Obie Dredge M.D.   On: 10/31/2018 08:49   Dg Chest Port 1v Same Day  Result Date: 10/30/2018 CLINICAL DATA:  Shortness of breath. EXAM: PORTABLE CHEST 1 VIEW COMPARISON:  Chest x-rays dated 10/28/2018, 11/09/2017 and 08/11/2017. FINDINGS: Stable cardiomegaly. Overall cardiomediastinal silhouette appears stable in size and configuration. Atherosclerotic changes again noted at the aortic arch. Lungs are clear. No pleural effusion or pneumothorax seen. Median sternotomy wires appear intact and stable in alignment. Osseous structures about the chest are unremarkable. IMPRESSION: 1. No active disease. No evidence of pneumonia or pulmonary  edema. 2. Stable cardiomegaly. 3. Aortic atherosclerosis. Electronically Signed   By: Bary Richard M.D.   On: 10/30/2018 15:07   Dg Abd Portable 1v-small Bowel Obstruction Protocol-initial, 8 Hr Delay  Result Date: 10/31/2018 CLINICAL DATA:  Bowel obstruction EXAM: PORTABLE ABDOMEN - 1 VIEW COMPARISON:  October 31, 2018 study obtained earlier in the day FINDINGS: Nasogastric tube tip and side port in stomach. There remain multiple loops of dilated small bowel in a pattern suggesting bowel obstruction. No free air. There is patchy infiltrate in the right lower lobe. Left base is clear. IMPRESSION: Nasogastric tube tip and side port in stomach. Bowel gas pattern suggest persistent bowel obstruction. No evident free air. Infiltrate right lung base. Electronically Signed   By: Bretta Bang III M.D.   On: 10/31/2018 21:49   Dg Abd Portable 1v-small Bowel Protocol-position Verification  Result Date: 10/31/2018 CLINICAL DATA:  NG tube placement. EXAM: PORTABLE ABDOMEN - 1 VIEW COMPARISON:  CT abdomen pelvis from same day. FINDINGS: Enteric tube tip in the stomach with the proximal side port in the lower esophagus. Multiple dilated loops of small bowel again noted. IMPRESSION: 1. Enteric tube tip in the stomach with the proximal side port in the lower esophagus. Recommend advancement by at least 3 cm. 2. Unchanged small bowel obstruction. Electronically Signed   By: Obie Dredge M.D.   On: 10/31/2018 11:56    Anti-infectives: Anti-infectives (From admission, onward)   Start  Dose/Rate Route Frequency Ordered Stop   10/31/18 0900  piperacillin-tazobactam (ZOSYN) IVPB 3.375 g     3.375 g 12.5 mL/hr over 240 Minutes Intravenous Every 8 hours 10/31/18 0830     10/29/18 1000  ceFAZolin (ANCEF) IVPB 2g/100 mL premix  Status:  Discontinued     2 g 200 mL/hr over 30 Minutes Intravenous Every 12 hours 10/29/18 0911 10/30/18 1222   10/28/18 1330  cefTRIAXone (ROCEPHIN) 2 g in sodium chloride 0.9 % 100 mL  IVPB  Status:  Discontinued     2 g 200 mL/hr over 30 Minutes Intravenous  Once 10/28/18 1316 10/28/18 1317   10/28/18 1330  vancomycin (VANCOCIN) IVPB 1000 mg/200 mL premix     1,000 mg 200 mL/hr over 60 Minutes Intravenous  Once 10/28/18 1318 10/28/18 1454   10/28/18 1330  ceFEPIme (MAXIPIME) 1 g in sodium chloride 0.9 % 100 mL IVPB  Status:  Discontinued     1 g 200 mL/hr over 30 Minutes Intravenous Every 24 hours 10/28/18 1326 10/29/18 0910   10/28/18 1318  vancomycin variable dose per unstable renal function (pharmacist dosing)  Status:  Discontinued      Does not apply See admin instructions 10/28/18 1318 10/29/18 0911      Assessment/Plan Dementia, non-verbal at baseline Hx of CVA HTN  SBO with concerns on CT for internal hernia - Dr. Cliffton Asters discussed case with patient's husband, [name] via phone. Discussed CT findings and potential for compromised bowel given swirling seen on CT and the potentional for death from her condition if this was the case. Surgical approaches and risks of this were discussed. Non-operative approaches were also discussed including NG tube and observation with the risk of decompensation and death. The patient's husband, [name] would like to hold off on surgery and would pursue non-operative management.  - Ng tube placed. Started on SBO protocol 3/23.  - 8 hour delayed films with persistent dilated loops of small bowel. No contrast in colon - Will obtain 24 hour films - Keep K > 4 and Mg >2 for bowel function - Patients husband had requested to be able to see her in light of new visitor restrictions 2/2 COVID 19 and this seems reasonable to me given her condition  FEN: NPO, NG tube, IVF VTE: SCDs, Heparin ID: Rocephin 3/20. Vanc, cefepime 3/20 - 3/21, Ancef 3/21 - 3/22. Zosyn 3/23 >> WBC 11.9 (3/24)   LOS: 3 days    Jacinto Halim , [facility] 11/01/2018, 8:04 AM Pager: [phone]

## 2018-11-01 NOTE — Consult Note (Signed)
Consultation Note Date: 11/01/2018   Patient Name: Amanda [name]  DOB: 1946/03/02  MRN: [id number]  Age / Sex: 73 y.o., female  PCP: Pecola Lawless, MD Referring Physician: Maretta Bees, MD  Reason for Consultation: Establishing goals of care and Inpatient hospice referral  HPI/Patient Profile: 74 y.o. female  with past medical history of stroke in 2003 with some recovery that allowed hrto return to work, dementia bed-bound status and non verbal fed by others, gout, s/p of aortic aneurysm repair, HTN,  CKD and hypertension, [facility] resident X 7 years, admitted on 10/28/2018 with sepsis, aspiration PNE, SBO.   Clinical Assessment and Goals of Care: Amanda [name] is resting quietly in bed  She appears acutely/chronically ill and frail.  She does not interact with me in any way, and is unable to make her basic needs known.     Call to husband at home number, Amanda [name] states wrong number, removed contact from demographics.  Call to husband at cell phone number. We have lengthy discussion related to Amanda [name]'s health and life. He shares that they have 2 sons and they are a close family.  Amanda [name] was employed by [employer] for many years, had a stroke but had some recovery, has been a resident of [facility] for 7 years in October.  Amanda [name] tells me that he has fed his wife for 6 years while in SNF.    We talk ain detail about  Her SBO and surgery recommendations.  Her episode of aspiration yesterday, and increasing WBC.  We talk about current treatment plan with NG to suction with out changes to condition.    As we talk, Amanda [name] becomes more upset telling me that he can not make choices for her because he can not come see her.  Amanda [name] tells me that his wife will responds better to him and he needs to know her state before he makes any choices.   I reassure Amanda [name] that I am calling to give him updates and not asking him to withdraw care.   Conference with attending, SW, and Water quality scientist related to patient needs, and family visitation.   HCPOA    HCPOA -  Spouse Amanda [name] tells me that he has legal POA but not legal guardianship.    SUMMARY OF RECOMMENDATIONS   At this point, continue to treat medically, but no CPR/intubation, allow a natural death.   Code Status/Advance Care Planning:  DNR  Symptom Management:   Per hospitalist, no additional needs at this time.   Palliative Prophylaxis:   Palliative Wound Care and Turn Reposition  Additional Recommendations (Limitations, Scope, Preferences):  continue with medical treatments, but no CPR, no intubation   Psycho-social/Spiritual:   Desire for further Chaplaincy support:no  Additional Recommendations: Caregiving  Support/Resources and Education on Hospice  Prognosis:   < 4 weeks would not be surprising, based on SBO/hernia, poor functional status, infection, bedsores.  If family chooses to focus on comfort only, 1-2 weeks expected.  Discharge Planning: to be determined, based on family choice for care.       Primary Diagnoses: Present on Admission:  Anemia of chronic disease  Alzheimer disease (HCC)  Dehydration with hypernatremia  Dementia with behavioral disturbance (HCC)  Essential hypertension, benign  SIRS (systemic inflammatory response syndrome) (HCC)  MSSA bacteremia   I have reviewed the medical record, interviewed the patient and family, and examined the patient. The following aspects are pertinent.  Past Medical History:  Diagnosis Date   Dementia (HCC)    "alzheimer's" (05/18/2012)   Fall 05/17/2012   Fracture of medial wall of orbit 05/17/2012   Gout    "? feet" (05/18/2012); uric acid 9.4 on 08/06/16 with R wrist pain; 7.1 on 08/15/16 on Allopurinol   Hypertension    Incontinence of urine    Stroke (HCC) ~  2003   "slight memory loss" (05/18/2012)   Wrist fracture, bilateral 05/17/2012   "fell down steps" (05/18/2012)   Social History   Socioeconomic History   Marital status: Married    Spouse name: Not on file   Number of children: Not on file   Years of education: Not on file   Highest education level: Not on file  Occupational History   Not on file  Social Needs   Financial resource strain: Not on file   Food insecurity:    Worry: Not on file    Inability: Not on file   Transportation needs:    Medical: Not on file    Non-medical: Not on file  Tobacco Use   Smoking status: Former Smoker    Types: Cigarettes   Smokeless tobacco: Never Used   Tobacco comment: Haematologist at [employer] reports that patient no longer smokes.   Substance and Sexual Activity   Alcohol use: No    Alcohol/week: 0.0 standard drinks    Comment: Staff at [facility] reports that patient no longer drinks.    Drug use: No    Types: Marijuana   Sexual activity: Not Currently  Lifestyle   Physical activity:    Days per week: Not on file    Minutes per session: Not on file   Stress: Not on file  Relationships   Social connections:    Talks on phone: Not on file    Gets together: Not on file    Attends religious service: Not on file    Active member of club or organization: Not on file    Attends meetings of clubs or organizations: Not on file    Relationship status: Not on file  Other Topics Concern   Not on file  Social History Narrative   Patient's husband is very involved in patient's care.   Family History  Problem Relation Age of Onset   Dementia Mother    Dementia Father    Dementia Sister    Dementia Brother    Scheduled Meds:  calcium-vitamin D   Oral Daily   cloNIDine  0.1 mg Oral BID   feeding supplement (ENSURE ENLIVE)   Oral BID   heparin  5,000 Units Subcutaneous Q8H   labetalol  300 mg Oral BID   multivitamin with minerals  1 tablet Oral Daily    senna-docusate  1 tablet Oral BID   Continuous Infusions:  sodium chloride 100 mL/hr at 11/01/18 0901   piperacillin-tazobactam (ZOSYN)  IV 3.375 g (11/01/18 0907)   PRN Meds:.acetaminophen **OR** acetaminophen, albuterol, bisacodyl, hydrALAZINE, magnesium hydroxide, ondansetron **OR** ondansetron (ZOFRAN) IV, Resource ThickenUp Clear Medications Prior to  Admission:  Prior to Admission medications   Medication Sig Start Date End Date Taking? Authorizing Provider  barrier cream (NON-SPECIFIED) CREA Apply 1 application topically 3 (three) times daily. Apply to inner left buttock each shift   Yes [provider]  bisacodyl (BISACODYL LAXATIVE) 10 MG suppository Place 10 mg rectally daily as needed for mild constipation.    Yes [provider]  Calcium Carb-Cholecalciferol ([employer] OYSTER SHELL CALCIUM+VIT D PO) Take 500 mg by mouth daily.   Yes [provider]  cloNIDine (CATAPRES) 0.1 MG tablet Take 0.1 mg by mouth 2 (two) times daily.    Yes [provider]  labetalol (NORMODYNE) 300 MG tablet Take 300 mg by mouth 2 (two) times daily.   Yes [provider]  magnesium hydroxide (MILK OF MAGNESIA) 400 MG/5ML suspension Take 30 mLs by mouth daily as needed for mild constipation.    Yes [provider]  Multiple Vitamins-Minerals (MULTIVITAMIN WITH MINERALS) tablet Take 1 tablet by mouth daily.   Yes [provider]  Nutritional Supplements (NUTRITIONAL SUPPLEMENT PO) Take 1 each by mouth 2 (two) times daily. Magic Cup   Yes [provider]  senna-docusate (SENOKOT-S) 8.6-50 MG tablet Take 1-2 tablets by mouth See admin instructions. Take 1 tablet QAM and 2 tablets QPM   Yes [provider]  Sodium Phosphates (RA SALINE ENEMA RE) Place 1 Container rectally daily as needed (constipation).    Yes [provider]   Allergies  Allergen Reactions   Pollen Extract Other (See Comments)    rhinitis   Review of  Systems  Unable to perform ROS: Dementia    Physical Exam Vitals signs and nursing note reviewed.     Vital Signs: BP (!) 158/87 (BP Location: Left Arm)    Pulse 96    Temp 99 F (37.2 C) (Oral)    Resp 20    Ht 5\' 1"  (1.549 m)    Wt 45.5 kg    SpO2 95%    BMI 18.95 kg/m  Pain Scale: Faces   Pain Score: Asleep   SpO2: SpO2: 95 % O2 Device:SpO2: 95 % O2 Flow Rate: .O2 Flow Rate (L/min): 3 L/min  IO: Intake/output summary:   Intake/Output Summary (Last 24 hours) at 11/01/2018 [id number] Last data filed at 11/01/2018 [id number] Gross per 24 hour  Intake 320.75 ml  Output 304.2 ml  Net 16.55 ml    LBM: Last BM Date: (PTA) Baseline Weight: Weight: 50.9 kg Most recent weight: Weight: 45.5 kg     Palliative Assessment/Data:   Flowsheet Rows     Most Recent Value  Intake Tab  Referral Department  Hospitalist  Unit at Time of Referral  Cardiac/Telemetry Unit  Palliative Care Primary Diagnosis  Other (Comment) [SBO]  Date Notified  11/01/18  Palliative Care Type  New Palliative care  Reason for referral  Clarify Goals of Care, End of Life Care Assistance  Date of Admission  10/29/18  Date first seen by Palliative Care  11/01/18  # of days Palliative referral response time  0 Day(s)  # of days IP prior to Palliative referral  3  Clinical Assessment  Palliative Performance Scale Score  20%  Pain Max last 24 hours  Not able to report  Pain Min Last 24 hours  Not able to report  Dyspnea Max Last 24 Hours  Not able to report  Dyspnea Min Last 24 hours  Not able to report  Psychosocial & Spiritual Assessment  Palliative Care Outcomes  Time In: 0920 Time Out: 1030 Time Total: 70 minutes Greater than 50%  of this time was spent counseling and coordinating care related to the above assessment and plan.  Signed by: Katheran Awe, NP   Please contact Palliative Medicine Team phone at [phone] for questions and concerns.  For individual provider: See Loretha Stapler

## 2018-11-01 NOTE — Progress Notes (Addendum)
PROGRESS NOTE        PATIENT DETAILS Name: Amanda [name] Age: 73 y.o. Sex: female Date of Birth: Jan 07, 1946 Admit Date: 10/28/2018 Admitting Physician Starleen Arms, MD [id number], Titus Dubin, MD  Brief Narrative: Patient is a 73 y.o. female with history of CVA, dementia, s/p aortic aneurysm repair-Per H&P-nonverbal at baseline-resident of SNF-presented to the ED for evaluation of fever, vomiting-initially thought to have MSSA bacteremia-however further culture results are positive for coag negative staph bacteremia.  Subsequently on 3/23-started having projectile vomiting-CT abdomen showing small bowel obstruction.  See below for further details found to have MSSA bacteremia.  See below for further details.  Subjective: Lethargic-but awakens-nonverbal at baseline.  NG tube in place-no BM per nursing staff.  Assessment/Plan: Sepsis: Initially thought to have MSSA bacteremia-based on BCID cultures-however further culture this morning is suggestive of coagulase negative staph one of 1/2 cultures drawn on admission.  Repeat blood cultures on 3/21- so far.  Suspect that this may be a contamination-no fever since admission-no other foci evident at this point.  Ancef was discontinued on 3/22-sepsis pathophysiology has resolved-unfortunately has developed aspiration pneumonia in the setting of bowel obstruction-and hence now on Zosyn.  Aspiration pneumonia: Continues to have significant upper airway sounds-but chest is actually better than yesterday on auscultation.  Continue Zosyn-plan on a total of 7-day course of treatment.  Small bowel obstruction: Continue supportive care with NG tube-n.p.o. status-IV fluids.  General surgery following-recommendations are for conservative treatment rather than pursuing surgical treatment in this frail bedbound patient with contractures.  Have consulted palliative care today to further delineate goals of care.  AKI on CKD  stage III: AKI hemodynamically mediated-slowly improving with supportive care-follow  Acute metabolic encephalopathy: Secondary to AKI, sepsis now with bowel obstruction.  Appears slightly more lethargic than the past few days-at baseline she is nonverbal contractures/nonambulatory at baseline.   Mild hypernatremia: Improved.  Hypertension: Due to SBO-unable to take any oral intake-stop oral clonidine, labetalol-start transdermal clonidine.  Follow.  Stage II sacral ulcer: Present prior to [facility] care evaluation.  Dementia/CVA (nonverbal/contractures/nonambulatory at baseline)  Palliative care: Difficult situation-unfortunately no surgical options to correct possible internal hernia causing SBO.  Looks very frail-finally managed to talk with patient's husband this morning-I explained complicated underlying medical/surgical issues-and recommended DNR status-assured him that DNR does not mean do not treat.  After extensive discussion-he asked me to do what was right for the patient-I subsequently recommended that DNR status was very appropriate in this setting-he was agreeable-changed to DNR status.  Per spouse-patient has been with hospice in the past.  I have asked the charge nurse to reach out to him to see if we can make an exception for him to visit his wife (COVID Pandemic visitor restrictions).  Patient's spouse is aware that if patient deteriorates-that we may have no choice apart from recommending hospice/end-of-life care.  DVT Prophylaxis: Prophylactic Heparin  Code Status: DNR  Family Communication: Left message for spouse to call back.  Disposition Plan: Remain inpatient- SNF on discharge  Antimicrobial agents: Anti-infectives (From admission, onward)   Start     Dose/Rate Route Frequency Ordered Stop   10/31/18 0900  piperacillin-tazobactam (ZOSYN) IVPB 3.375 g     3.375 g 12.5 mL/hr over 240 Minutes Intravenous Every 8 hours 10/31/18 0830     10/29/18 1000   ceFAZolin (ANCEF) IVPB 2g/100 mL  premix  Status:  Discontinued     2 g 200 mL/hr over 30 Minutes Intravenous Every 12 hours 10/29/18 0911 10/30/18 1222   10/28/18 1330  cefTRIAXone (ROCEPHIN) 2 g in sodium chloride 0.9 % 100 mL IVPB  Status:  Discontinued     2 g 200 mL/hr over 30 Minutes Intravenous  Once 10/28/18 1316 10/28/18 1317   10/28/18 1330  vancomycin (VANCOCIN) IVPB 1000 mg/200 mL premix     1,000 mg 200 mL/hr over 60 Minutes Intravenous  Once 10/28/18 1318 10/28/18 1454   10/28/18 1330  ceFEPIme (MAXIPIME) 1 g in sodium chloride 0.9 % 100 mL IVPB  Status:  Discontinued     1 g 200 mL/hr over 30 Minutes Intravenous Every 24 hours 10/28/18 1326 10/29/18 0910   10/28/18 1318  vancomycin variable dose per unstable renal function (pharmacist dosing)  Status:  Discontinued      Does not apply See admin instructions 10/28/18 1318 10/29/18 0911      Procedures: None  CONSULTS:  None  Time spent: 35- minutes-Greater than 50% of this time was spent in counseling, explanation of diagnosis, planning of further management, and coordination of care.  MEDICATIONS: Scheduled Meds:  calcium-vitamin D   Oral Daily   cloNIDine  0.1 mg Oral BID   feeding supplement (ENSURE ENLIVE)   Oral BID   heparin  5,000 Units Subcutaneous Q8H   labetalol  300 mg Oral BID   multivitamin with minerals  1 tablet Oral Daily   senna-docusate  1 tablet Oral BID   Continuous Infusions:  sodium chloride 100 mL/hr at 11/01/18 0901   piperacillin-tazobactam (ZOSYN)  IV 3.375 g (11/01/18 0907)   PRN Meds:.acetaminophen **OR** acetaminophen, albuterol, bisacodyl, hydrALAZINE, magnesium hydroxide, ondansetron **OR** ondansetron (ZOFRAN) IV, Resource ThickenUp Clear   PHYSICAL EXAM: Vital signs: Vitals:   10/31/18 0514 10/31/18 1353 10/31/18 2157 11/01/18 0454  BP:  140/80 128/81 (!) 158/87  Pulse:  73 81 96  Resp:  20    Temp:  98.2 F (36.8 C) 98.5 F (36.9 C) 99 F (37.2 C)  TempSrc:   Oral Oral Oral  SpO2: 90% 98% 93% 95%  Weight:      Height:       Filed Weights   10/28/18 0959 10/28/18 1530  Weight: 50.9 kg 45.5 kg   Body mass index is 18.95 kg/m.   General appearance:Awake, very lethargic-opens eyes briefly.  Slightly tachypneic Eyes:no scleral icterus. HEENT: Atraumatic and Normocephalic Resp:Good air entry bilaterally, transmitted upper airway sounds CVS: S1 S2 regular GI: Bowel sounds present, nontender-nondistended Extremities: B/L Lower Ext shows no edema, both legs are warm to touch Neurology: Contracted/nonverbal-unable to evaluate further Musculoskeletal:No digital cyanosis Skin:No Rash, warm and dry Wounds:N/A  I have personally reviewed following labs and imaging studies  LABORATORY DATA: CBC: Recent Labs  Lab 10/28/18 1020 10/29/18 0539 10/30/18 0454 10/31/18 0729 10/31/18 0846 11/01/18 0724  WBC 13.4* 13.4* 9.8 11.7* 11.9* 13.9*  NEUTROABS 10.8*  --   --   --   --   --   HGB 14.4 13.8 12.8 12.8 12.8 11.8*  HCT 47.2* 43.9 39.6 41.3 39.1 36.5  MCV 95.2 94.2 93.8 93.2 91.8 91.5  PLT 260 209 230 209 201 233    Basic Metabolic Panel: Recent Labs  Lab 10/28/18 1020 10/29/18 0539 10/30/18 0454 10/31/18 0729 11/01/18 0724  NA 147* 148* 142 141 140  K 4.0 3.6 3.2* 3.6 3.0*  CL 112* 114* 110 110 104  CO2 24  22 21* 20* 20*  GLUCOSE 133* 118* 96 125* 104*  BUN 35* 27* 19 19 26*  CREATININE 2.36* 1.56* 1.23* 1.31* 1.71*  CALCIUM 10.2 9.5 8.9 8.7* 8.9    GFR: Estimated Creatinine Clearance: 20.4 mL/min (A) (by C-G formula based on SCr of 1.71 mg/dL (H)).  Liver Function Tests: Recent Labs  Lab 10/28/18 1020  AST 24  ALT 22  ALKPHOS 75  BILITOT 0.8  PROT 8.3*  ALBUMIN 3.8   Recent Labs  Lab 10/31/18 0846  LIPASE 42   No results for input(s): AMMONIA in the last 168 hours.  Coagulation Profile: No results for input(s): INR, PROTIME in the last 168 hours.  Cardiac Enzymes: No results for input(s): CKTOTAL, CKMB,  CKMBINDEX, TROPONINI in the last 168 hours.  BNP (last 3 results) No results for input(s): PROBNP in the last 8760 hours.  HbA1C: No results for input(s): HGBA1C in the last 72 hours.  CBG: No results for input(s): GLUCAP in the last 168 hours.  Lipid Profile: No results for input(s): CHOL, HDL, LDLCALC, TRIG, CHOLHDL, LDLDIRECT in the last 72 hours.  Thyroid Function Tests: No results for input(s): TSH, T4TOTAL, FREET4, T3FREE, THYROIDAB in the last 72 hours.  Anemia Panel: No results for input(s): VITAMINB12, FOLATE, FERRITIN, TIBC, IRON, RETICCTPCT in the last 72 hours.  Urine analysis:    Component Value Date/Time   COLORURINE AMBER (A) 10/28/2018 1215   APPEARANCEUR CLOUDY (A) 10/28/2018 1215   LABSPEC 1.029 10/28/2018 1215   PHURINE 5.0 10/28/2018 1215   GLUCOSEU NEGATIVE 10/28/2018 1215   HGBUR NEGATIVE 10/28/2018 1215   BILIRUBINUR NEGATIVE 10/28/2018 1215   KETONESUR 20 (A) 10/28/2018 1215   PROTEINUR 30 (A) 10/28/2018 1215   UROBILINOGEN 0.2 05/17/2012 1424   NITRITE NEGATIVE 10/28/2018 1215   LEUKOCYTESUR NEGATIVE 10/28/2018 1215    Sepsis Labs: Lactic Acid, Venous    Component Value Date/Time   LATICACIDVEN 1.4 10/28/2018 1020    MICROBIOLOGY: Recent Results (from the past 240 hour(s))  Blood Culture (routine x 2)     Status: None (Preliminary result)   Collection Time: 10/28/18 10:08 AM  Result Value Ref Range Status   Specimen Description BLOOD LEFT HAND  Final   Special Requests   Final    BOTTLES DRAWN AEROBIC ONLY Blood Culture results may not be optimal due to an inadequate volume of blood received in culture bottles   Culture   Final    NO GROWTH 3 DAYS Performed at [facility], [address], [place], [place] [address]    Report Status PENDING  Incomplete  Blood Culture (routine x 2)     Status: Abnormal   Collection Time: 10/28/18 10:20 AM  Result Value Ref Range Status   Specimen Description BLOOD LEFT HAND  Final   Special  Requests   Final    BOTTLES DRAWN AEROBIC AND ANAEROBIC Blood Culture adequate volume   Culture  Setup Time   Final    GRAM POSITIVE COCCI ANAEROBIC BOTTLE ONLY CRITICAL RESULT CALLED TO, READ BACK BY AND VERIFIED WITH: PHARMD ANNA LOVE [phone] [id number] FCP    Culture (A)  Final    STAPHYLOCOCCUS SPECIES (COAGULASE NEGATIVE) THE SIGNIFICANCE OF ISOLATING THIS ORGANISM FROM A SINGLE SET OF BLOOD CULTURES WHEN MULTIPLE SETS ARE DRAWN IS UNCERTAIN. PLEASE NOTIFY THE MICROBIOLOGY DEPARTMENT WITHIN ONE WEEK IF SPECIATION AND SENSITIVITIES ARE REQUIRED. Performed at [facility], [address] [place], [place] [address]    Report Status 10/31/2018  FINAL  Final  Blood Culture ID Panel (Reflexed)     Status: Abnormal   Collection Time: 10/28/18 10:20 AM  Result Value Ref Range Status   Enterococcus species NOT DETECTED NOT DETECTED Final   Listeria monocytogenes NOT DETECTED NOT DETECTED Final   Staphylococcus species DETECTED (A) NOT DETECTED Final    Comment: Methicillin (oxacillin) susceptible coagulase negative staphylococcus. Possible blood culture contaminant (unless isolated from more than one blood culture draw or clinical case suggests pathogenicity). No antibiotic treatment is indicated for blood  culture contaminants. CRITICAL RESULT CALLED TO, READ BACK BY AND VERIFIED WITH: PHARMD ANNA LOVE [phone] [id number] FCP    Staphylococcus aureus (BCID) NOT DETECTED NOT DETECTED Final   Methicillin resistance NOT DETECTED NOT DETECTED Final   Streptococcus species NOT DETECTED NOT DETECTED Final   Streptococcus agalactiae NOT DETECTED NOT DETECTED Final   Streptococcus pneumoniae NOT DETECTED NOT DETECTED Final   Streptococcus pyogenes NOT DETECTED NOT DETECTED Final   Acinetobacter baumannii NOT DETECTED NOT DETECTED Final   Enterobacteriaceae species NOT DETECTED NOT DETECTED Final   Enterobacter cloacae complex NOT DETECTED NOT DETECTED Final   Escherichia coli NOT DETECTED NOT DETECTED  Final   Klebsiella oxytoca NOT DETECTED NOT DETECTED Final   Klebsiella pneumoniae NOT DETECTED NOT DETECTED Final   Proteus species NOT DETECTED NOT DETECTED Final   Serratia marcescens NOT DETECTED NOT DETECTED Final   Haemophilus influenzae NOT DETECTED NOT DETECTED Final   Neisseria meningitidis NOT DETECTED NOT DETECTED Final   Pseudomonas aeruginosa NOT DETECTED NOT DETECTED Final   Candida albicans NOT DETECTED NOT DETECTED Final   Candida glabrata NOT DETECTED NOT DETECTED Final   Candida krusei NOT DETECTED NOT DETECTED Final   Candida parapsilosis NOT DETECTED NOT DETECTED Final   Candida tropicalis NOT DETECTED NOT DETECTED Final    Comment: Performed at [facility], [address], [place], [place] [address]  Urine culture     Status: Abnormal   Collection Time: 10/28/18 12:15 PM  Result Value Ref Range Status   Specimen Description URINE, CLEAN CATCH  Final   Special Requests NONE  Final   Culture (A)  Final    <10,000 COLONIES/mL INSIGNIFICANT GROWTH Performed at [facility], [address] [place], [place] [address]    Report Status 10/29/2018 FINAL  Final  MRSA PCR Screening     Status: None   Collection Time: 10/28/18  5:58 PM  Result Value Ref Range Status   MRSA by PCR NEGATIVE NEGATIVE Final    Comment:        The GeneXpert MRSA Assay (FDA approved for NASAL specimens only), is one component of a comprehensive MRSA colonization surveillance program. It is not intended to diagnose MRSA infection nor to guide or monitor treatment for MRSA infections. Performed at [facility], [address] [place], [place] [address]   Culture, blood (routine x 2)     Status: None (Preliminary result)   Collection Time: 10/29/18 12:39 PM  Result Value Ref Range Status   Specimen Description BLOOD LEFT WRIST  Final   Special Requests   Final    BOTTLES DRAWN AEROBIC ONLY Blood Culture adequate volume   Culture   Final    NO GROWTH 2  DAYS Performed at [facility], [address] [place], [place] [address]    Report Status PENDING  Incomplete  Culture, blood (routine x 2)     Status: None (Preliminary result)  Collection Time: 10/29/18 12:39 PM  Result Value Ref Range Status   Specimen Description BLOOD LEFT HAND  Final   Special Requests   Final    BOTTLES DRAWN AEROBIC ONLY Blood Culture adequate volume   Culture   Final    NO GROWTH 2 DAYS Performed at [facility], [address] [place], [place] [address]    Report Status PENDING  Incomplete    RADIOLOGY STUDIES/RESULTS: Ct Abdomen Pelvis Wo Contrast  Result Date: 10/31/2018 CLINICAL DATA:  Nausea and vomiting EXAM: CT ABDOMEN AND PELVIS WITHOUT CONTRAST TECHNIQUE: Multidetector CT imaging of the abdomen and pelvis was performed following the standard protocol without IV contrast. COMPARISON:  08/16/2017 FINDINGS: Lower chest: Lung bases demonstrate patchy infiltrate throughout the right middle and lower lobe. Mild atelectatic changes are noted in the bases bilaterally. Hepatobiliary: Gallbladder is well distended. Some dependent density is noted which may be related to gallbladder sludge. No definitive calculi are seen. The liver is within normal limits. Pancreas: Unremarkable. No pancreatic ductal dilatation or surrounding inflammatory changes. Spleen: Normal in size without focal abnormality. Adrenals/Urinary Tract: Adrenal glands are within normal limits. Kidneys are well visualized bilaterally. No renal calculi or obstructive changes are seen. Scattered cysts are noted within the left kidney similar to that noted on prior exam. No obstructive changes are seen. The bladder is decompressed. Stomach/Bowel: The appendix is not well visualized. No inflammatory changes are seen. The colon is decompressed. Multiple dilated loops of small bowel are noted involving the mid to distal jejunum and proximal ileum. There is a transition zone identified in the right  mid abdomen best seen on image number 46 of series 3. Additionally some swirling of the vasculature is seen suggestive of internal hernia. The stomach is within normal limits. 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 No significant lymphadenopathy is noted. Reproductive: Status post hysterectomy. No adnexal masses. Other: Minimal free fluid is noted within the pelvis likely reactive in nature. No free air is seen. Musculoskeletal: Degenerative changes of lumbar spine are noted. IMPRESSION: Findings consistent with small bowel obstruction with a transition zone in the right mid abdomen as described. Some associated swirling of vasculature is noted suspicious for internal hernia. 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 Patchy right middle and right lower lobe acute infiltrate. Changes suggestive of gallbladder sludge. These results will be called to the ordering clinician or representative by the Radiologist Assistant, and communication documented in the PACS or zVision Dashboard. Electronically Signed   By: Alcide Clever M.D.   On: 10/31/2018 10:23   Dg Chest Port 1 View  Result Date: 10/28/2018 CLINICAL DATA:  Fever.  Altered mental status. EXAM: PORTABLE CHEST 1 VIEW COMPARISON:  11/09/2017. FINDINGS: Prior CABG. Cardiomegaly. No pulmonary venous congestion. No focal infiltrate. No pleural effusion or pneumothorax. No acute bony abnormality. IMPRESSION: 1.  Prior CABG.  Cardiomegaly.  No pulmonary venous congestion. 2.  No focal infiltrate. Electronically Signed   By: Maisie Fus  Register   On: 10/28/2018 11:43   Dg Chest Port 1v Same Day  Result Date: 10/31/2018 CLINICAL DATA:  Shortness of breath.  Concern for aspiration. EXAM: PORTABLE CHEST 1 VIEW COMPARISON:  Chest x-ray from yesterday. FINDINGS: Stable  cardiomediastinal silhouette with grossly unchanged ascending thoracic aorta pseudoaneurysm, accounting for the bulging contour along the right heart border. Normal pulmonary vascularity.  No focal consolidation, pleural effusion, or pneumothorax. Linear atelectasis in the peripheral lingula. No acute osseous abnormality. IMPRESSION: 1. No active disease. 2. 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 Electronically Signed   By: Obie Dredge M.D.   On: 10/31/2018 08:49   Dg Chest Port 1v Same Day  Result Date: 10/30/2018 CLINICAL DATA:  Shortness of breath. EXAM: PORTABLE CHEST 1 VIEW COMPARISON:  Chest x-rays dated 10/28/2018, 11/09/2017 and 08/11/2017. FINDINGS: Stable cardiomegaly. Overall cardiomediastinal silhouette appears stable in size and configuration. Atherosclerotic changes again noted at the aortic arch. Lungs are clear. No pleural effusion or pneumothorax seen. Median sternotomy wires appear intact and stable in alignment. Osseous structures about the chest are unremarkable. IMPRESSION: 1. No active disease. No evidence of pneumonia or pulmonary edema. 2. Stable cardiomegaly. 3. Aortic atherosclerosis. Electronically Signed   By: Bary Richard M.D.   On: 10/30/2018 15:07   Dg Abd Portable 1v  Result Date: 11/01/2018 CLINICAL DATA:  Follow-up small bowel obstruction. EXAM: PORTABLE ABDOMEN - 1 VIEW COMPARISON:  10/31/2018 FINDINGS: There are dilated loops of small bowel that are without significant change from the previous day's exam. Some residual contrast is noted in the stomach. A nasogastric tube tip projects in the left upper quadrant, well within the stomach, also unchanged. No new abnormalities. IMPRESSION: 1. Persistent small bowel obstruction, with no significant change from the previous day's study. Electronically Signed   By: Amie Portland M.D.   On: 11/01/2018 08:33   Dg Abd Portable 1v-small Bowel Obstruction Protocol-initial, 8 Hr Delay  Result Date:  10/31/2018 CLINICAL DATA:  Bowel obstruction EXAM: PORTABLE ABDOMEN - 1 VIEW COMPARISON:  October 31, 2018 study obtained earlier in the day FINDINGS: Nasogastric tube tip and side port in stomach. There remain multiple loops of dilated small bowel in a pattern suggesting bowel obstruction. No free air. There is patchy infiltrate in the right lower lobe. Left base is clear. IMPRESSION: Nasogastric tube tip and side port in stomach. Bowel gas pattern suggest persistent bowel obstruction. No evident free air. Infiltrate right lung base. Electronically Signed   By: Bretta Bang III M.D.   On: 10/31/2018 21:49   Dg Abd Portable 1v-small Bowel Protocol-position Verification  Result Date: 10/31/2018 CLINICAL DATA:  NG tube placement. EXAM: PORTABLE ABDOMEN - 1 VIEW COMPARISON:  CT abdomen pelvis from same day. FINDINGS: Enteric tube tip in the stomach with the proximal side port in the lower esophagus. Multiple dilated loops of small bowel again noted. IMPRESSION: 1. Enteric tube tip in the stomach with the proximal side port in the lower esophagus. Recommend advancement by at least 3 cm. 2. Unchanged small bowel obstruction. Electronically Signed   By: Obie Dredge M.D.   On: 10/31/2018 11:56     LOS: 3 days   Jeoffrey Massed, MD  [facility]  If 7PM-7AM, please contact night-coverage  Please page via [email or web address]  Go to [email or web address] and use 's universal password to access. If you do not have the password, please contact the hospital operator.  Locate the [facility] provider you are looking for under [facility] and page to a number that you can be directly reached. If you still have difficulty reaching the provider, please page the [facility] (Director on Call) for the Hospitalists listed on amion for assistance.  11/01/2018, 10:29 AM

## 2018-11-01 NOTE — Progress Notes (Signed)
SLP Cancellation Note  Patient Details Name: Amanda [name] MRN: [id number] DOB: Jan 08, 1946   Cancelled treatment:       Reason Eval/Treat Not Completed: Medical issues which prohibited therapy - pt remains NPO with NGT to LIS given concern for SBO. Will f/u as able.   Amanda [name] 11/01/2018, 8:20 AM  Ivar Drape, M.A. CCC-SLP Acute Herbalist [phone] Office [phone]

## 2018-11-02 ENCOUNTER — Inpatient Hospital Stay (HOSPITAL_COMMUNITY): Payer: Medicare Other

## 2018-11-02 LAB — BASIC METABOLIC PANEL
Anion gap: 15 (ref 5–15)
BUN: 24 mg/dL — ABNORMAL HIGH (ref 8–23)
CO2: 20 mmol/L — ABNORMAL LOW (ref 22–32)
CREATININE: 1.85 mg/dL — AB (ref 0.44–1.00)
Calcium: 9 mg/dL (ref 8.9–10.3)
Chloride: 103 mmol/L (ref 98–111)
GFR calc Af Amer: 30 mL/min — ABNORMAL LOW (ref >60)
GFR calc non Af Amer: 26 mL/min — ABNORMAL LOW (ref >60)
Glucose, Bld: 107 mg/dL — ABNORMAL HIGH (ref 70–99)
Potassium: 3.2 mmol/L — ABNORMAL LOW (ref 3.5–5.1)
Sodium: 138 mmol/L (ref 135–145)

## 2018-11-02 LAB — CBC
HCT: 35.5 % — ABNORMAL LOW (ref 36.0–46.0)
Hemoglobin: 11.6 g/dL — ABNORMAL LOW (ref 12.0–15.0)
MCH: 29.7 pg (ref 26.0–34.0)
MCHC: 32.7 g/dL (ref 30.0–36.0)
MCV: 90.8 fL (ref 80.0–100.0)
Platelets: 278 10*3/uL (ref 150–400)
RBC: 3.91 MIL/uL (ref 3.87–5.11)
RDW: 13.5 % (ref 11.5–15.5)
WBC: 14.4 10*3/uL — AB (ref 4.0–10.5)
nRBC: 0 % (ref 0.0–0.2)

## 2018-11-02 LAB — CULTURE, BLOOD (ROUTINE X 2): CULTURE: NO GROWTH

## 2018-11-02 MED ORDER — POTASSIUM CHLORIDE 2 MEQ/ML IV SOLN
INTRAVENOUS | Status: DC
  Administered 2018-11-02: 08:00:00 via INTRAVENOUS
  Filled 2018-11-02 (×2): qty 1000

## 2018-11-02 MED ORDER — KCL IN DEXTROSE-NACL 40-5-0.45 MEQ/L-%-% IV SOLN
INTRAVENOUS | Status: DC
  Administered 2018-11-02 – 2018-11-04 (×5): via INTRAVENOUS
  Filled 2018-11-02 (×5): qty 1000

## 2018-11-02 NOTE — Progress Notes (Addendum)
PROGRESS NOTE        PATIENT DETAILS Name: Amanda [name] Age: 73 y.o. Sex: female Date of Birth: 1946-01-19 Admit Date: 10/28/2018 Admitting Physician Starleen Arms, MD ZOX:[id number], Titus Dubin, MD  Brief Narrative: Patient is a 73 y.o. female with history of CVA, dementia, s/p aortic aneurysm repair-Per H&P-nonverbal at baseline-resident of SNF-presented to the ED for evaluation of fever, vomiting-initially thought to have MSSA bacteremia-however further culture results are positive for coag negative staph bacteremia.  Subsequently on 3/23-started having projectile vomiting-CT abdomen showing small bowel obstruction, also thought to have aspiration pneumonia and resultant respiratory failure.  See below for further details  Subjective: Lethargic-more tachypneic than yesterday-NG tube in place.  No BM per nursing staff.  Assessment/Plan: Sepsis: Initially thought to have MSSA bacteremia-based on BCID cultures-however no culture results suggestive of coag negative staph on 1/2 set of cultures drawn admission.  Repeat blood cultures on 3/21- so far.  Suspect this is more of a contamination.  Initially was on Ancef-however in the setting of vomiting and resultant aspiration pneumonia-has been switched to Zosyn.  Acute hypoxic respiratory failure secondary to aspiration pneumonia: Secondary to vomiting in the setting of SBO.  Continue supportive care-requiring up to 4 L of oxygen to maintain O2 saturations above 90%-continue Zosyn-we will plan on a total of 7-day course of treatment   Small bowel obstruction possible internal hernia: No BM overnight-x-ray still showing persistent small bowel obstruction.  Per general surgery not a candidate for surgical repair.  Continue supportive care-palliative care following-if no improvement-May need to transition to full hospice care.  AKI on CKD stage III: AKI hemodynamically mediated-improved-but now worsening-continue  supportive care-avoid nephrotoxic agents.    Acute metabolic encephalopathy: Secondary to AKI, sepsis now with bowel obstruction.  Appears slightly more lethargic than the past few days-at baseline she is nonverbal contractures/nonambulatory at baseline.   Mild hypernatremia: Improved.  Hypokalemia: We will add KCl to IV fluids-recheck electrolytes tomorrow  Hypertension: Due to SBO-unable to take any oral intake-continue transdermal clonidine-BP reasonably well-controlled this morning-follow and optimize over the next few days.   Stage II sacral ulcer: Present prior to [facility] care evaluation.  Dementia/CVA (nonverbal/contractures/nonambulatory at baseline)  Palliative care: Difficult situation-unfortunately no surgical options to correct possible internal hernia causing SBO.  Has developed acute hypoxic respiratory failure secondary to aspiration pneumonia in the setting of vomiting due to SBO.  Slightly tachypneic-requiring at least 4 L of oxygen-continues to have persistent bowel obstruction.  I had a long conversation with the patient's husband-[name] over the phone on 3/24-he is aware of poor overall prognosis-and the potential of transitioning to hospice care if patient continues to deteriorate.  I recommended DNR status on 3/24-after extensive discussion, he asked me to do what was right for the patient-I explained that DNR was very appropriate in this setting-and a half subsequently placed a DNR order-he was agreeable.  I have attempted to reach out to him on 3/25-I have left a voicemail.  Palliative care following-high likelihood that patient will continue deteriorate and require transition to hospice/end-of-life care.  Addendum 9:57 AM: Was able to get in touch with patient's husband [name]-he is aware of patient's poor overall prognosis-and lack of any clinical improvement.  He is aware of the fact that if patient deteriorates-we will need to transition to comfort measures.   He is hopeful that the  patient will improve.  We again discussed DNR status-he seems to be more acceptable of DNR status today-given that we do not have any surgical means to fix the underlying bowel obstruction.  Addendum/late entry: spoke via phone with son (who was at bedside)-he is understanding of the patient's prognoses-and that if the patient does not improve in the next day or so-he is agreeable to transitioning to comfort measures.   DVT Prophylaxis: Prophylactic Heparin  Code Status: DNR  Family Communication: Left message for spouse to call back on 3/25  Disposition Plan: Remain inpatient- SNF on discharge  Antimicrobial agents: Anti-infectives (From admission, onward)   Start     Dose/Rate Route Frequency Ordered Stop   10/31/18 0900  piperacillin-tazobactam (ZOSYN) IVPB 3.375 g     3.375 g 12.5 mL/hr over 240 Minutes Intravenous Every 8 hours 10/31/18 0830     10/29/18 1000  ceFAZolin (ANCEF) IVPB 2g/100 mL premix  Status:  Discontinued     2 g 200 mL/hr over 30 Minutes Intravenous Every 12 hours 10/29/18 0911 10/30/18 1222   10/28/18 1330  cefTRIAXone (ROCEPHIN) 2 g in sodium chloride 0.9 % 100 mL IVPB  Status:  Discontinued     2 g 200 mL/hr over 30 Minutes Intravenous  Once 10/28/18 1316 10/28/18 1317   10/28/18 1330  vancomycin (VANCOCIN) IVPB 1000 mg/200 mL premix     1,000 mg 200 mL/hr over 60 Minutes Intravenous  Once 10/28/18 1318 10/28/18 1454   10/28/18 1330  ceFEPIme (MAXIPIME) 1 g in sodium chloride 0.9 % 100 mL IVPB  Status:  Discontinued     1 g 200 mL/hr over 30 Minutes Intravenous Every 24 hours 10/28/18 1326 10/29/18 0910   10/28/18 1318  vancomycin variable dose per unstable renal function (pharmacist dosing)  Status:  Discontinued      Does not apply See admin instructions 10/28/18 1318 10/29/18 0911      Procedures: None  CONSULTS:  None  Time spent: 35- minutes-Greater than 50% of this time was spent in counseling, explanation of  diagnosis, planning of further management, and coordination of care.  MEDICATIONS: Scheduled Meds:  calcium-vitamin D   Oral Daily   cloNIDine  0.2 mg Transdermal Weekly   feeding supplement (ENSURE ENLIVE)   Oral BID   heparin  5,000 Units Subcutaneous Q8H   multivitamin with minerals  1 tablet Oral Daily   senna-docusate  1 tablet Oral BID   Continuous Infusions:  dextrose 5 % and 0.45% NaCl 1,000 mL with potassium chloride 40 mEq infusion 100 mL/hr at 11/02/18 0748   piperacillin-tazobactam (ZOSYN)  IV 3.375 g (11/02/18 0843)   PRN Meds:.acetaminophen **OR** acetaminophen, albuterol, bisacodyl, hydrALAZINE, magnesium hydroxide, ondansetron **OR** ondansetron (ZOFRAN) IV, Resource ThickenUp Clear   PHYSICAL EXAM: Vital signs: Vitals:   11/02/18 0616 11/02/18 0644 11/02/18 0733 11/02/18 0841  BP: (!) 171/114 (!) 150/88 (!) 146/89 119/72  Pulse: (!) 113 (!) 117 (!) 118 (!) 112  Resp:  (!) 36 (!) 44 (!) 38  Temp:  99.4 F (37.4 C) 99.1 F (37.3 C) 97.8 F (36.6 C)  TempSrc:  Oral Oral Oral  SpO2: 98% 96% 97% 99%  Weight:      Height:       Filed Weights   10/28/18 0959 10/28/18 1530  Weight: 50.9 kg 45.5 kg   Body mass index is 18.95 kg/m.   General appearance: Lethargic-slightly tachypneic-opens eyes-does not follow commands-nonverbal.  NG tube in place. Eyes:no scleral icterus. HEENT: Atraumatic and Normocephalic Neck:  supple Resp:Good air entry bilaterally, scattered rales-some transmitted upper airway sounds CVS: S1 S2 regular, no murmurs.  GI: Bowel sounds present, difficult exam-but does not appear distended-does not flinch with deep palpation. Extremities: B/L Lower Ext shows no edema, both legs are warm to touch Neurology: + contractures Musculoskeletal:No digital cyanosis Skin:No Rash, warm and dry  I have personally reviewed following labs and imaging studies  LABORATORY DATA: CBC: Recent Labs  Lab 10/28/18 1020  10/30/18 0454  10/31/18 0729 10/31/18 0846 11/01/18 0724 11/02/18 0354  WBC 13.4*   < > 9.8 11.7* 11.9* 13.9* 14.4*  NEUTROABS 10.8*  --   --   --   --   --   --   HGB 14.4   < > 12.8 12.8 12.8 11.8* 11.6*  HCT 47.2*   < > 39.6 41.3 39.1 36.5 35.5*  MCV 95.2   < > 93.8 93.2 91.8 91.5 90.8  PLT 260   < > 230 209 201 233 278   < > = values in this interval not displayed.    Basic Metabolic Panel: Recent Labs  Lab 10/29/18 0539 10/30/18 0454 10/31/18 0729 11/01/18 0724 11/02/18 0354  NA 148* 142 141 140 138  K 3.6 3.2* 3.6 3.0* 3.2*  CL 114* 110 110 104 103  CO2 22 21* 20* 20* 20*  GLUCOSE 118* 96 125* 104* 107*  BUN 27* 19 19 26* 24*  CREATININE 1.56* 1.23* 1.31* 1.71* 1.85*  CALCIUM 9.5 8.9 8.7* 8.9 9.0    GFR: Estimated Creatinine Clearance: 18.9 mL/min (A) (by C-G formula based on SCr of 1.85 mg/dL (H)).  Liver Function Tests: Recent Labs  Lab 10/28/18 1020  AST 24  ALT 22  ALKPHOS 75  BILITOT 0.8  PROT 8.3*  ALBUMIN 3.8   Recent Labs  Lab 10/31/18 0846  LIPASE 42   No results for input(s): AMMONIA in the last 168 hours.  Coagulation Profile: No results for input(s): INR, PROTIME in the last 168 hours.  Cardiac Enzymes: No results for input(s): CKTOTAL, CKMB, CKMBINDEX, TROPONINI in the last 168 hours.  BNP (last 3 results) No results for input(s): PROBNP in the last 8760 hours.  HbA1C: No results for input(s): HGBA1C in the last 72 hours.  CBG: No results for input(s): GLUCAP in the last 168 hours.  Lipid Profile: No results for input(s): CHOL, HDL, LDLCALC, TRIG, CHOLHDL, LDLDIRECT in the last 72 hours.  Thyroid Function Tests: No results for input(s): TSH, T4TOTAL, FREET4, T3FREE, THYROIDAB in the last 72 hours.  Anemia Panel: No results for input(s): VITAMINB12, FOLATE, FERRITIN, TIBC, IRON, RETICCTPCT in the last 72 hours.  Urine analysis:    Component Value Date/Time   COLORURINE AMBER (A) 10/28/2018 1215   APPEARANCEUR CLOUDY (A) 10/28/2018  1215   LABSPEC 1.029 10/28/2018 1215   PHURINE 5.0 10/28/2018 1215   GLUCOSEU NEGATIVE 10/28/2018 1215   HGBUR NEGATIVE 10/28/2018 1215   BILIRUBINUR NEGATIVE 10/28/2018 1215   KETONESUR 20 (A) 10/28/2018 1215   PROTEINUR 30 (A) 10/28/2018 1215   UROBILINOGEN 0.2 05/17/2012 1424   NITRITE NEGATIVE 10/28/2018 1215   LEUKOCYTESUR NEGATIVE 10/28/2018 1215    Sepsis Labs: Lactic Acid, Venous    Component Value Date/Time   LATICACIDVEN 1.4 10/28/2018 1020    MICROBIOLOGY: Recent Results (from the past 240 hour(s))  Blood Culture (routine x 2)     Status: None (Preliminary result)   Collection Time: 10/28/18 10:08 AM  Result Value Ref Range Status   Specimen Description BLOOD  LEFT HAND  Final   Special Requests   Final    BOTTLES DRAWN AEROBIC ONLY Blood Culture results may not be optimal due to an inadequate volume of blood received in culture bottles   Culture   Final    NO GROWTH 4 DAYS Performed at [facility], [address], [place], [place] [address]    Report Status PENDING  Incomplete  Blood Culture (routine x 2)     Status: Abnormal   Collection Time: 10/28/18 10:20 AM  Result Value Ref Range Status   Specimen Description BLOOD LEFT HAND  Final   Special Requests   Final    BOTTLES DRAWN AEROBIC AND ANAEROBIC Blood Culture adequate volume   Culture  Setup Time   Final    GRAM POSITIVE COCCI ANAEROBIC BOTTLE ONLY CRITICAL RESULT CALLED TO, READ BACK BY AND VERIFIED WITH: PHARMD ANNA LOVE [phone] [id number] FCP    Culture (A)  Final    STAPHYLOCOCCUS SPECIES (COAGULASE NEGATIVE) THE SIGNIFICANCE OF ISOLATING THIS ORGANISM FROM A SINGLE SET OF BLOOD CULTURES WHEN MULTIPLE SETS ARE DRAWN IS UNCERTAIN. PLEASE NOTIFY THE MICROBIOLOGY DEPARTMENT WITHIN ONE WEEK IF SPECIATION AND SENSITIVITIES ARE REQUIRED. Performed at [facility], [address], [place], [place] [address]    Report Status 10/31/2018 FINAL  Final  Blood Culture ID Panel (Reflexed)     Status:  Abnormal   Collection Time: 10/28/18 10:20 AM  Result Value Ref Range Status   Enterococcus species NOT DETECTED NOT DETECTED Final   Listeria monocytogenes NOT DETECTED NOT DETECTED Final   Staphylococcus species DETECTED (A) NOT DETECTED Final    Comment: Methicillin (oxacillin) susceptible coagulase negative staphylococcus. Possible blood culture contaminant (unless isolated from more than one blood culture draw or clinical case suggests pathogenicity). No antibiotic treatment is indicated for blood  culture contaminants. CRITICAL RESULT CALLED TO, READ BACK BY AND VERIFIED WITH: PHARMD ANNA LOVE [phone] [id number] FCP    Staphylococcus aureus (BCID) NOT DETECTED NOT DETECTED Final   Methicillin resistance NOT DETECTED NOT DETECTED Final   Streptococcus species NOT DETECTED NOT DETECTED Final   Streptococcus agalactiae NOT DETECTED NOT DETECTED Final   Streptococcus pneumoniae NOT DETECTED NOT DETECTED Final   Streptococcus pyogenes NOT DETECTED NOT DETECTED Final   Acinetobacter baumannii NOT DETECTED NOT DETECTED Final   Enterobacteriaceae species NOT DETECTED NOT DETECTED Final   Enterobacter cloacae complex NOT DETECTED NOT DETECTED Final   Escherichia coli NOT DETECTED NOT DETECTED Final   Klebsiella oxytoca NOT DETECTED NOT DETECTED Final   Klebsiella pneumoniae NOT DETECTED NOT DETECTED Final   Proteus species NOT DETECTED NOT DETECTED Final   Serratia marcescens NOT DETECTED NOT DETECTED Final   Haemophilus influenzae NOT DETECTED NOT DETECTED Final   Neisseria meningitidis NOT DETECTED NOT DETECTED Final   Pseudomonas aeruginosa NOT DETECTED NOT DETECTED Final   Candida albicans NOT DETECTED NOT DETECTED Final   Candida glabrata NOT DETECTED NOT DETECTED Final   Candida krusei NOT DETECTED NOT DETECTED Final   Candida parapsilosis NOT DETECTED NOT DETECTED Final   Candida tropicalis NOT DETECTED NOT DETECTED Final    Comment: Performed at [facility], [address], [place], [place] [address]  Urine culture     Status: Abnormal   Collection Time: 10/28/18 12:15 PM  Result Value Ref Range Status   Specimen Description URINE, CLEAN CATCH  Final   Special Requests NONE  Final   Culture (A)  Final    <  10,000 COLONIES/mL INSIGNIFICANT GROWTH Performed at [facility], [address], [place], [place] [address]    Report Status 10/29/2018 FINAL  Final  MRSA PCR Screening     Status: None   Collection Time: 10/28/18  5:58 PM  Result Value Ref Range Status   MRSA by PCR NEGATIVE NEGATIVE Final    Comment:        The GeneXpert MRSA Assay (FDA approved for NASAL specimens only), is one component of a comprehensive MRSA colonization surveillance program. It is not intended to diagnose MRSA infection nor to guide or monitor treatment for MRSA infections. Performed at [facility], [address], [place], [place] [address]   Culture, blood (routine x 2)     Status: None (Preliminary result)   Collection Time: 10/29/18 12:39 PM  Result Value Ref Range Status   Specimen Description BLOOD LEFT WRIST  Final   Special Requests   Final    BOTTLES DRAWN AEROBIC ONLY Blood Culture adequate volume   Culture   Final    NO GROWTH 3 DAYS Performed at [facility], [address] [place], [place] [address]    Report Status PENDING  Incomplete  Culture, blood (routine x 2)     Status: None (Preliminary result)   Collection Time: 10/29/18 12:39 PM  Result Value Ref Range Status   Specimen Description BLOOD LEFT HAND  Final   Special Requests   Final    BOTTLES DRAWN AEROBIC ONLY Blood Culture adequate volume   Culture   Final    NO GROWTH 3 DAYS Performed at [facility], [address] [place], [place] [address]    Report Status PENDING  Incomplete    RADIOLOGY STUDIES/RESULTS: Ct Abdomen Pelvis Wo Contrast  Result Date: 10/31/2018 CLINICAL DATA:  Nausea and vomiting EXAM: CT ABDOMEN AND PELVIS WITHOUT CONTRAST TECHNIQUE:  Multidetector CT imaging of the abdomen and pelvis was performed following the standard protocol without IV contrast. COMPARISON:  08/16/2017 FINDINGS: Lower chest: Lung bases demonstrate patchy infiltrate throughout the right middle and lower lobe. Mild atelectatic changes are noted in the bases bilaterally. Hepatobiliary: Gallbladder is well distended. Some dependent density is noted which may be related to gallbladder sludge. No definitive calculi are seen. The liver is within normal limits. Pancreas: Unremarkable. No pancreatic ductal dilatation or surrounding inflammatory changes. Spleen: Normal in size without focal abnormality. Adrenals/Urinary Tract: Adrenal glands are within normal limits. Kidneys are well visualized bilaterally. No renal calculi or obstructive changes are seen. Scattered cysts are noted within the left kidney similar to that noted on prior exam. No obstructive changes are seen. The bladder is decompressed. Stomach/Bowel: The appendix is not well visualized. No inflammatory changes are seen. The colon is decompressed. Multiple dilated loops of small bowel are noted involving the mid to distal jejunum and proximal ileum. There is a transition zone identified in the right mid abdomen best seen on image number 46 of series 3. Additionally some swirling of the vasculature is seen suggestive of internal hernia. The stomach is within normal limits. 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 No significant lymphadenopathy is noted. Reproductive: Status post hysterectomy. No adnexal masses. Other: Minimal free fluid is noted within the pelvis likely reactive in nature. No free air is seen. Musculoskeletal: Degenerative changes of lumbar spine are noted. IMPRESSION: Findings consistent with small  bowel obstruction with a transition zone in the right mid abdomen as  described. Some associated swirling of vasculature is noted suspicious for internal hernia. 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 Patchy right middle and right lower lobe acute infiltrate. Changes suggestive of gallbladder sludge. These results will be called to the ordering clinician or representative by the Radiologist Assistant, and communication documented in the PACS or zVision Dashboard. Electronically Signed   By: Alcide Clever M.D.   On: 10/31/2018 10:23   Dg Chest Port 1 View  Result Date: 10/28/2018 CLINICAL DATA:  Fever.  Altered mental status. EXAM: PORTABLE CHEST 1 VIEW COMPARISON:  11/09/2017. FINDINGS: Prior CABG. Cardiomegaly. No pulmonary venous congestion. No focal infiltrate. No pleural effusion or pneumothorax. No acute bony abnormality. IMPRESSION: 1.  Prior CABG.  Cardiomegaly.  No pulmonary venous congestion. 2.  No focal infiltrate. Electronically Signed   By: Maisie Fus  Register   On: 10/28/2018 11:43   Dg Chest Port 1v Same Day  Result Date: 11/02/2018 CLINICAL DATA:  Shortness of breath. EXAM: PORTABLE CHEST 1 VIEW COMPARISON:  Radiograph of October 31, 2018. CT scan of August 16, 2017. FINDINGS: Stable cardiomediastinal silhouette is noted with stable right cardiac border bulge consistent with thoracic aortic pseudoaneurysm. Atherosclerosis of thoracic aorta is noted. Interval placement of nasogastric tube with tip in expected position of proximal stomach. No pneumothorax or pleural effusion is noted. Mild left lingular subsegmental atelectasis is noted. Mild right middle lobe subsegmental atelectasis or infiltrate is noted. Bony thorax is unremarkable. IMPRESSION: Mild left lingular subsegmental atelectasis. Mild right middle lobe subsegmental atelectasis or infiltrate is noted. Stable bulge involving right cardiac border consistent with history of ascending thoracic aortic pseudoaneurysm. Electronically Signed   By: Lupita Raider, M.D.   On: 11/02/2018 08:19   Dg Chest Port 1v Same Day  Result Date: 10/31/2018 CLINICAL DATA:  Shortness of breath.  Concern for aspiration. EXAM: PORTABLE CHEST 1 VIEW COMPARISON:  Chest x-ray from yesterday. FINDINGS: Stable cardiomediastinal silhouette with grossly unchanged ascending thoracic aorta pseudoaneurysm, accounting for the bulging contour along the right heart border. Normal pulmonary vascularity. No focal consolidation, pleural effusion, or pneumothorax. Linear atelectasis in the peripheral lingula. No acute osseous abnormality. IMPRESSION: 1. No active disease. 2. 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 Electronically Signed   By: Obie Dredge M.D.   On: 10/31/2018 08:49   Dg Chest Port 1v Same Day  Result Date: 10/30/2018 CLINICAL DATA:  Shortness of breath. EXAM: PORTABLE CHEST 1 VIEW COMPARISON:  Chest x-rays dated 10/28/2018, 11/09/2017 and 08/11/2017. FINDINGS: Stable cardiomegaly. Overall cardiomediastinal silhouette appears stable in size and configuration. Atherosclerotic changes again noted at the aortic arch. Lungs are clear. No pleural effusion or pneumothorax seen. Median sternotomy wires appear intact and stable in alignment. Osseous structures about the chest are unremarkable. IMPRESSION: 1. No active disease. No evidence of pneumonia or pulmonary edema. 2. Stable cardiomegaly. 3. Aortic atherosclerosis. Electronically Signed   By: Bary Richard M.D.   On: 10/30/2018 15:07   Dg Abd Portable 1v  Result Date: 11/01/2018 CLINICAL DATA:  Follow-up small bowel obstruction. EXAM: PORTABLE ABDOMEN - 1 VIEW COMPARISON:  10/31/2018 FINDINGS: There are dilated loops of small bowel that are without significant change from the previous day's exam. Some residual contrast is noted in the stomach. A nasogastric tube tip projects in the left upper quadrant, well within the stomach, also unchanged. No new abnormalities. IMPRESSION: 1. Persistent small  bowel  obstruction, with no significant change from the previous day's study. Electronically Signed   By: Amie Portland M.D.   On: 11/01/2018 08:33   Dg Abd Portable 1v-small Bowel Obstruction Protocol-initial, 8 Hr Delay  Result Date: 10/31/2018 CLINICAL DATA:  Bowel obstruction EXAM: PORTABLE ABDOMEN - 1 VIEW COMPARISON:  October 31, 2018 study obtained earlier in the day FINDINGS: Nasogastric tube tip and side port in stomach. There remain multiple loops of dilated small bowel in a pattern suggesting bowel obstruction. No free air. There is patchy infiltrate in the right lower lobe. Left base is clear. IMPRESSION: Nasogastric tube tip and side port in stomach. Bowel gas pattern suggest persistent bowel obstruction. No evident free air. Infiltrate right lung base. Electronically Signed   By: Bretta Bang III M.D.   On: 10/31/2018 21:49   Dg Abd Portable 1v-small Bowel Protocol-position Verification  Result Date: 10/31/2018 CLINICAL DATA:  NG tube placement. EXAM: PORTABLE ABDOMEN - 1 VIEW COMPARISON:  CT abdomen pelvis from same day. FINDINGS: Enteric tube tip in the stomach with the proximal side port in the lower esophagus. Multiple dilated loops of small bowel again noted. IMPRESSION: 1. Enteric tube tip in the stomach with the proximal side port in the lower esophagus. Recommend advancement by at least 3 cm. 2. Unchanged small bowel obstruction. Electronically Signed   By: Obie Dredge M.D.   On: 10/31/2018 11:56     LOS: 4 days   Jeoffrey Massed, MD  [facility]  If 7PM-7AM, please contact night-coverage  Please page via [email or web address]  Go to [email or web address] and use [facility] universal password to access. If you do not have the password, please contact the hospital operator.  Locate the [facility] provider you are looking for under [facility] and page to a number that you can be directly reached. If you still have difficulty reaching the provider, please page the [facility] (Director  on Call) for the Hospitalists listed on amion for assistance.  11/02/2018, 8:55 AM

## 2018-11-02 NOTE — Progress Notes (Signed)
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   I met today at Amanda [name]'s bedside. She is unresponsive with her son, Amanda [name], at bedside. He tells me that he has discussed with his mother's doctor her poor prognosis. He is very clear that he does not want her to suffer at all but also notes that his father struggles with even the thought of letting her go. He shares that his father will not even discuss this with him over the years. He understands prognosis and is trying to help support his father as much as possible.   I awaited return call as Amanda [name] was supposed to be on his way to the hospital. I returned to the room but only Amanda [name] at bedside asleep. Palliative will follow up with Amanda [name] tomorrow.   Exam: Unresponsive. No distress. Respirations shallow and tachypneic.   Plan: - Will need further discussion with Amanda [name] regarding GOC.  - Recommend comfort care.   25 min  Vinie Sill, NP Palliative Medicine Team Pager # [phone] (M-F 8a-5p) Team Phone # [phone] (Nights/Weekends)

## 2018-11-02 NOTE — Progress Notes (Signed)
Subjective: CC:  Resting comfortably in bed  Objective: Vital signs in last 24 hours: Temp:  [97.8 F (36.6 C)-99.6 F (37.6 C)] 97.8 F (36.6 C) (03/25 0841) Pulse Rate:  [87-118] 112 (03/25 0841) Resp:  [17-44] 38 (03/25 0841) BP: (119-171)/(72-114) 119/72 (03/25 0841) SpO2:  [92 %-99 %] 99 % (03/25 0841) Last BM Date: (pta)  Intake/Output from previous day: 03/24 0701 - 03/25 0700 In: 2228.4 [I.V.:1775.1; IV Piggyback:453.3] Out: 1450 [Emesis/NG output:1450] Intake/Output this shift: No intake/output data recorded.  PE: Gen: resting comfortably. Easily awoken Heart: Regular Lungs: Normal effort Abd: Mild distension, soft, no obvious tenderness or grimacing with palpation. No rigidity or guarding . Hypoactive bowel sounds. NG tube with 1450 output overnight. 700cc of bilious output in cannister Msk: Chronic contractures of upper and lower extremities. Unable to lie flat 2/2 to this.   Lab Results:  Recent Labs    11/01/18 0724 11/02/18 0354  WBC 13.9* 14.4*  HGB 11.8* 11.6*  HCT 36.5 35.5*  PLT 233 278   BMET Recent Labs    11/01/18 0724 11/02/18 0354  NA 140 138  K 3.0* 3.2*  CL 104 103  CO2 20* 20*  GLUCOSE 104* 107*  BUN 26* 24*  CREATININE 1.71* 1.85*  CALCIUM 8.9 9.0   PT/INR No results for input(s): LABPROT, INR in the last 72 hours. CMP     Component Value Date/Time   NA 138 11/02/2018 0354   NA 143 12/17/2016   K 3.2 (L) 11/02/2018 0354   CL 103 11/02/2018 0354   CO2 20 (L) 11/02/2018 0354   GLUCOSE 107 (H) 11/02/2018 0354   BUN 24 (H) 11/02/2018 0354   BUN 18 12/17/2016   CREATININE 1.85 (H) 11/02/2018 0354   CALCIUM 9.0 11/02/2018 0354   PROT 8.3 (H) 10/28/2018 1020   ALBUMIN 3.8 10/28/2018 1020   AST 24 10/28/2018 1020   ALT 22 10/28/2018 1020   ALKPHOS 75 10/28/2018 1020   BILITOT 0.8 10/28/2018 1020   GFRNONAA 26 (L) 11/02/2018 0354   GFRAA 30 (L) 11/02/2018 0354   Lipase     Component Value Date/Time   LIPASE  42 10/31/2018 0846       Studies/Results: Ct Abdomen Pelvis Wo Contrast  Result Date: 10/31/2018 CLINICAL DATA:  Nausea and vomiting EXAM: CT ABDOMEN AND PELVIS WITHOUT CONTRAST TECHNIQUE: Multidetector CT imaging of the abdomen and pelvis was performed following the standard protocol without IV contrast. COMPARISON:  08/16/2017 FINDINGS: Lower chest: Lung bases demonstrate patchy infiltrate throughout the right middle and lower lobe. Mild atelectatic changes are noted in the bases bilaterally. Hepatobiliary: Gallbladder is well distended. Some dependent density is noted which may be related to gallbladder sludge. No definitive calculi are seen. The liver is within normal limits. Pancreas: Unremarkable. No pancreatic ductal dilatation or surrounding inflammatory changes. Spleen: Normal in size without focal abnormality. Adrenals/Urinary Tract: Adrenal glands are within normal limits. Kidneys are well visualized bilaterally. No renal calculi or obstructive changes are seen. Scattered cysts are noted within the left kidney similar to that noted on prior exam. No obstructive changes are seen. The bladder is decompressed. Stomach/Bowel: The appendix is not well visualized. No inflammatory changes are seen. The colon is decompressed. Multiple dilated loops of small bowel are noted involving the mid to distal jejunum and proximal ileum. There is a transition zone identified in the right mid abdomen best seen on image number 46 of series 3. Additionally some swirling of the vasculature  is seen suggestive of internal hernia. The stomach is within normal limits. 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 No significant lymphadenopathy is noted. Reproductive: Status post hysterectomy. No adnexal masses. Other: Minimal free fluid is noted within the pelvis likely reactive in nature.  No free air is seen. Musculoskeletal: Degenerative changes of lumbar spine are noted. IMPRESSION: Findings consistent with small bowel obstruction with a transition zone in the right mid abdomen as described. Some associated swirling of vasculature is noted suspicious for internal hernia. 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 Patchy right middle and right lower lobe acute infiltrate. Changes suggestive of gallbladder sludge. These results will be called to the ordering clinician or representative by the Radiologist Assistant, and communication documented in the PACS or zVision Dashboard. Electronically Signed   By: Alcide Clever M.D.   On: 10/31/2018 10:23   Dg Chest Port 1v Same Day  Result Date: 11/02/2018 CLINICAL DATA:  Shortness of breath. EXAM: PORTABLE CHEST 1 VIEW COMPARISON:  Radiograph of October 31, 2018. CT scan of August 16, 2017. FINDINGS: Stable cardiomediastinal silhouette is noted with stable right cardiac border bulge consistent with thoracic aortic pseudoaneurysm. Atherosclerosis of thoracic aorta is noted. Interval placement of nasogastric tube with tip in expected position of proximal stomach. No pneumothorax or pleural effusion is noted. Mild left lingular subsegmental atelectasis is noted. Mild right middle lobe subsegmental atelectasis or infiltrate is noted. Bony thorax is unremarkable. IMPRESSION: Mild left lingular subsegmental atelectasis. Mild right middle lobe subsegmental atelectasis or infiltrate is noted. Stable bulge involving right cardiac border consistent with history of ascending thoracic aortic pseudoaneurysm. Electronically Signed   By: Lupita Raider, M.D.   On: 11/02/2018 08:19   Dg Abd Portable 1v  Result Date: 11/01/2018 CLINICAL DATA:  Follow-up small bowel obstruction. EXAM: PORTABLE ABDOMEN - 1 VIEW COMPARISON:  10/31/2018 FINDINGS: There are dilated loops of small bowel that are without significant change  from the previous day's exam. Some residual contrast is noted in the stomach. A nasogastric tube tip projects in the left upper quadrant, well within the stomach, also unchanged. No new abnormalities. IMPRESSION: 1. Persistent small bowel obstruction, with no significant change from the previous day's study. Electronically Signed   By: Amie Portland M.D.   On: 11/01/2018 08:33   Dg Abd Portable 1v-small Bowel Obstruction Protocol-initial, 8 Hr Delay  Result Date: 10/31/2018 CLINICAL DATA:  Bowel obstruction EXAM: PORTABLE ABDOMEN - 1 VIEW COMPARISON:  October 31, 2018 study obtained earlier in the day FINDINGS: Nasogastric tube tip and side port in stomach. There remain multiple loops of dilated small bowel in a pattern suggesting bowel obstruction. No free air. There is patchy infiltrate in the right lower lobe. Left base is clear. IMPRESSION: Nasogastric tube tip and side port in stomach. Bowel gas pattern suggest persistent bowel obstruction. No evident free air. Infiltrate right lung base. Electronically Signed   By: Bretta Bang III M.D.   On: 10/31/2018 21:49   Dg Abd Portable 1v-small Bowel Protocol-position Verification  Result Date: 10/31/2018 CLINICAL DATA:  NG tube placement. EXAM: PORTABLE ABDOMEN - 1 VIEW COMPARISON:  CT abdomen pelvis from same day. FINDINGS: Enteric tube tip in the stomach with the proximal side port in the lower esophagus. Multiple dilated loops of small bowel again noted. IMPRESSION: 1. Enteric tube tip in the stomach with  the proximal side port in the lower esophagus. Recommend advancement by at least 3 cm. 2. Unchanged small bowel obstruction. Electronically Signed   By: Obie Dredge M.D.   On: 10/31/2018 11:56    Anti-infectives: Anti-infectives (From admission, onward)   Start     Dose/Rate Route Frequency Ordered Stop   10/31/18 0900  piperacillin-tazobactam (ZOSYN) IVPB 3.375 g     3.375 g 12.5 mL/hr over 240 Minutes Intravenous Every 8 hours 10/31/18  0830 11/06/18 0906   10/29/18 1000  ceFAZolin (ANCEF) IVPB 2g/100 mL premix  Status:  Discontinued     2 g 200 mL/hr over 30 Minutes Intravenous Every 12 hours 10/29/18 0911 10/30/18 1222   10/28/18 1330  cefTRIAXone (ROCEPHIN) 2 g in sodium chloride 0.9 % 100 mL IVPB  Status:  Discontinued     2 g 200 mL/hr over 30 Minutes Intravenous  Once 10/28/18 1316 10/28/18 1317   10/28/18 1330  vancomycin (VANCOCIN) IVPB 1000 mg/200 mL premix     1,000 mg 200 mL/hr over 60 Minutes Intravenous  Once 10/28/18 1318 10/28/18 1454   10/28/18 1330  ceFEPIme (MAXIPIME) 1 g in sodium chloride 0.9 % 100 mL IVPB  Status:  Discontinued     1 g 200 mL/hr over 30 Minutes Intravenous Every 24 hours 10/28/18 1326 10/29/18 0910   10/28/18 1318  vancomycin variable dose per unstable renal function (pharmacist dosing)  Status:  Discontinued      Does not apply See admin instructions 10/28/18 1318 10/29/18 0911       Assessment/Plan Dementia, non-verbal at baseline Hx of CVA HTN CKD - Cr elevated from baseline. 1.85 (3/25)  SBO with concerns on CT for internal hernia - Dr. Cliffton Asters discussed case with patient's husband, [name] via phone.Discussed CT findings and potential for compromised bowel given swirling seen on CT and the potentional for death from her condition if this was the case. Surgical approaches and risks of this were discussed. Non-operative approaches were also discussed including NG tube and observation with the risk of decompensation and death. The patient's husband, [name] would like to hold off on surgeryand would pursue non-operative management.   - Ng tube placed. Started on SBO protocol 3/23. Xrays with persistent SBO -- Keep K > 4 and Mg >2 for bowel function - Patients husband had requested to be able to see her in light of new visitor restrictions 2/2 COVID 19 and this seems reasonable to me given her condition - Appreciate palliative consult. Will continue to treat medically and  follow  FEN: NPO, NG tube, IVF, K 3.2 VTE: SCDs, Heparin ID: Rocephin 3/20. Vanc, cefepime 3/20 - 3/21, Ancef 3/21 - 3/22. Zosyn 3/23 - 3/29  WBC 14.4 (3/24)    LOS: 4 days    Amanda [name] , [facility] 11/02/2018, 9:35 AM Pager: [phone]

## 2018-11-02 NOTE — Plan of Care (Signed)
  Problem: Health Behavior/Discharge Planning: Goal: Ability to manage health-related needs will improve Outcome: Progressing   Problem: Clinical Measurements: Goal: Respiratory complications will improve Outcome: Progressing Goal: Cardiovascular complication will be avoided Outcome: Progressing   Problem: Safety: Goal: Ability to remain free from injury will improve Outcome: Progressing   Problem: Skin Integrity: Goal: Risk for impaired skin integrity will decrease Outcome: Progressing   Problem: Clinical Measurements: Goal: Ability to maintain clinical measurements within normal limits will improve Outcome: Not Progressing Goal: Will remain free from infection Outcome: Not Progressing Goal: Diagnostic test results will improve Outcome: Not Progressing   Problem: Elimination: Goal: Will not experience complications related to bowel motility Outcome: Not Progressing Goal: Will not experience complications related to urinary retention Outcome: Not Progressing   Problem: Education: Goal: Knowledge of General Education information will improve Description Including pain rating scale, medication(s)/side effects and non-pharmacologic comfort measures Outcome: Not Met (add Reason) Patient non-verbal and only responds to pain.   Problem: Activity: Goal: Risk for activity intolerance will decrease Outcome: Not Met (add Reason) Patient is chronically contracted and does not make any significant movements.  Problem: Coping: Goal: Level of anxiety will decrease Outcome: Not Applicable Patient is nonverbal.

## 2018-11-02 NOTE — Progress Notes (Addendum)
I have tried to reach out to her husband again today (twice) at the number listed in her chart, Amanda [name], to no avail. We have been trying to update him on her stay in the hospital as his visitation with her has been restricted 2/2 COVID visitor restrictions. We will continue to try to keep open line of communication and updates   Stephanie Coup. Cliffton Asters, M.D. [facility]

## 2018-11-02 NOTE — Progress Notes (Signed)
SLP Cancellation Note  Patient Details Name: TAQUIA PIZZOLA MRN: [id number] DOB: 25-Jan-1946   Cancelled treatment:       Reason Eval/Treat Not Completed: Medical issues which prohibited therapy. Pt still NPO with NG tube tx. Will discontinue SLP orders for now as plan of care is being addressed and pt remains inappropriate for interventions. Please reorder if needed.   Harlon Ditty, MA CCC-SLP  Acute Rehabilitation Services Pager [phone] Office [phone]  Claudine Mouton 11/02/2018, 10:36 AM

## 2018-11-03 ENCOUNTER — Inpatient Hospital Stay (HOSPITAL_COMMUNITY): Payer: Medicare Other

## 2018-11-03 DIAGNOSIS — G300 Alzheimer's disease with early onset: Secondary | ICD-10-CM

## 2018-11-03 DIAGNOSIS — L89102 Pressure ulcer of unspecified part of back, stage 2: Secondary | ICD-10-CM

## 2018-11-03 DIAGNOSIS — F0281 Dementia in other diseases classified elsewhere with behavioral disturbance: Secondary | ICD-10-CM

## 2018-11-03 LAB — BASIC METABOLIC PANEL
Anion gap: 13 (ref 5–15)
BUN: 31 mg/dL — ABNORMAL HIGH (ref 8–23)
CO2: 24 mmol/L (ref 22–32)
Calcium: 9.4 mg/dL (ref 8.9–10.3)
Chloride: 103 mmol/L (ref 98–111)
Creatinine, Ser: 2.3 mg/dL — ABNORMAL HIGH (ref 0.44–1.00)
GFR calc Af Amer: 23 mL/min — ABNORMAL LOW (ref >60)
GFR calc non Af Amer: 20 mL/min — ABNORMAL LOW (ref >60)
Glucose, Bld: 171 mg/dL — ABNORMAL HIGH (ref 70–99)
Potassium: 4.8 mmol/L (ref 3.5–5.1)
Sodium: 140 mmol/L (ref 135–145)

## 2018-11-03 LAB — CBC
HEMATOCRIT: 40.6 % (ref 36.0–46.0)
Hemoglobin: 13.5 g/dL (ref 12.0–15.0)
MCH: 30.7 pg (ref 26.0–34.0)
MCHC: 33.3 g/dL (ref 30.0–36.0)
MCV: 92.3 fL (ref 80.0–100.0)
Platelets: 292 10*3/uL (ref 150–400)
RBC: 4.4 MIL/uL (ref 3.87–5.11)
RDW: 13.8 % (ref 11.5–15.5)
WBC: 16.6 10*3/uL — AB (ref 4.0–10.5)
nRBC: 0 % (ref 0.0–0.2)

## 2018-11-03 LAB — CULTURE, BLOOD (ROUTINE X 2)
Culture: NO GROWTH
Culture: NO GROWTH
Special Requests: ADEQUATE
Special Requests: ADEQUATE

## 2018-11-03 LAB — MAGNESIUM: Magnesium: 2 mg/dL (ref 1.7–2.4)

## 2018-11-03 MED ORDER — PIPERACILLIN-TAZOBACTAM IN DEX 2-0.25 GM/50ML IV SOLN
2.2500 g | Freq: Three times a day (TID) | INTRAVENOUS | Status: DC
  Administered 2018-11-03 – 2018-11-04 (×3): 2.25 g via INTRAVENOUS
  Filled 2018-11-03 (×4): qty 50

## 2018-11-03 MED ORDER — MORPHINE SULFATE (CONCENTRATE) 10 MG/0.5ML PO SOLN: 1.2500 mg | ORAL | Status: DC | PRN

## 2018-11-03 MED ORDER — SODIUM CHLORIDE 0.9 % IV BOLUS
1000.0000 mL | Freq: Once | INTRAVENOUS | Status: AC
  Administered 2018-11-03: 1000 mL via INTRAVENOUS

## 2018-11-03 NOTE — Progress Notes (Signed)
Went to the room to discuss and update patient's husband and son who had come to the hospital earlier, however due to misinformation went back. We discussed in details about patient's advanced debilitated condition, multiple medical problems, poor quality of life and poor chances of recovery. Patient son is well-informed and is inquiring about hospice care. Patient's husband is also very receptive, however he could not quite come to conclusion about his stopping active treatment and starting comfort measures. Plan: Remains DNR. Continue supportive treatment and antibiotics. Patient son and husband will visit tomorrow morning at around 10 AM, will update palliative care team and will meet with the patient family. Patient is known to hospice of [facility] and was followed by [facility] in the past. If family agrees, will anticipate transfer to inpatient hospice.

## 2018-11-03 NOTE — Progress Notes (Signed)
Subjective: CC:  Resting comfortably in bed  Objective: Vital signs in last 24 hours: Temp:  [97.8 F (36.6 C)-98.5 F (36.9 C)] 98.5 F (36.9 C) (03/26 0429) Pulse Rate:  [86-118] 90 (03/26 0432) Resp:  [15-38] 15 (03/26 0429) BP: (102-195)/(72-150) 140/80 (03/26 0432) SpO2:  [97 %-100 %] 100 % (03/26 0429) Last BM Date: (pta)  Intake/Output from previous day: 03/25 0701 - 03/26 0700 In: 915.4 [I.V.:759.5; IV Piggyback:155.8] Out: 1800 [Emesis/NG output:1800] Intake/Output this shift: Total I/O In: -  Out: 700 [Emesis/NG output:700]  PE: Gen: resting comfortably. Easily awoken Heart: Regular Lungs: Normal effort Abd: Mild distension, soft, no obvious tenderness or grimacing with palpation. No rigidity or guarding . Hypoactive bowel sounds. Msk: Chronic contractures of upper and lower extremities. Unable to lie flat 2/2 to this.    NGT 1.8L/24hrs, bilious  Lab Results:  Recent Labs    11/02/18 0354 11/03/18 0409  WBC 14.4* 16.6*  HGB 11.6* 13.5  HCT 35.5* 40.6  PLT 278 292   BMET Recent Labs    11/02/18 0354 11/03/18 0409  NA 138 140  K 3.2* 4.8  CL 103 103  CO2 20* 24  GLUCOSE 107* 171*  BUN 24* 31*  CREATININE 1.85* 2.30*  CALCIUM 9.0 9.4   PT/INR No results for input(s): LABPROT, INR in the last 72 hours. CMP     Component Value Date/Time   NA 140 11/03/2018 0409   NA 143 12/17/2016   K 4.8 11/03/2018 0409   CL 103 11/03/2018 0409   CO2 24 11/03/2018 0409   GLUCOSE 171 (H) 11/03/2018 0409   BUN 31 (H) 11/03/2018 0409   BUN 18 12/17/2016   CREATININE 2.30 (H) 11/03/2018 0409   CALCIUM 9.4 11/03/2018 0409   PROT 8.3 (H) 10/28/2018 1020   ALBUMIN 3.8 10/28/2018 1020   AST 24 10/28/2018 1020   ALT 22 10/28/2018 1020   ALKPHOS 75 10/28/2018 1020   BILITOT 0.8 10/28/2018 1020   GFRNONAA 20 (L) 11/03/2018 0409   GFRAA 23 (L) 11/03/2018 0409   Lipase     Component Value Date/Time   LIPASE 42 10/31/2018 0846        Studies/Results: Dg Chest Port 1v Same Day  Result Date: 11/02/2018 CLINICAL DATA:  Shortness of breath. EXAM: PORTABLE CHEST 1 VIEW COMPARISON:  Radiograph of October 31, 2018. CT scan of August 16, 2017. FINDINGS: Stable cardiomediastinal silhouette is noted with stable right cardiac border bulge consistent with thoracic aortic pseudoaneurysm. Atherosclerosis of thoracic aorta is noted. Interval placement of nasogastric tube with tip in expected position of proximal stomach. No pneumothorax or pleural effusion is noted. Mild left lingular subsegmental atelectasis is noted. Mild right middle lobe subsegmental atelectasis or infiltrate is noted. Bony thorax is unremarkable. IMPRESSION: Mild left lingular subsegmental atelectasis. Mild right middle lobe subsegmental atelectasis or infiltrate is noted. Stable bulge involving right cardiac border consistent with history of ascending thoracic aortic pseudoaneurysm. Electronically Signed   By: Lupita Raider, M.D.   On: 11/02/2018 08:19   Dg Abd Portable 1v  Result Date: 11/01/2018 CLINICAL DATA:  Follow-up small bowel obstruction. EXAM: PORTABLE ABDOMEN - 1 VIEW COMPARISON:  10/31/2018 FINDINGS: There are dilated loops of small bowel that are without significant change from the previous day's exam. Some residual contrast is noted in the stomach. A nasogastric tube tip projects in the left upper quadrant, well within the stomach, also unchanged. No new abnormalities. IMPRESSION: 1. Persistent small bowel obstruction,  with no significant change from the previous day's study. Electronically Signed   By: Amie Portland M.D.   On: 11/01/2018 08:33    Anti-infectives: Anti-infectives (From admission, onward)   Start     Dose/Rate Route Frequency Ordered Stop   10/31/18 0900  piperacillin-tazobactam (ZOSYN) IVPB 3.375 g     3.375 g 12.5 mL/hr over 240 Minutes Intravenous Every 8 hours 10/31/18 0830 11/06/18 0906   10/29/18 1000  ceFAZolin (ANCEF) IVPB 2g/100 mL  premix  Status:  Discontinued     2 g 200 mL/hr over 30 Minutes Intravenous Every 12 hours 10/29/18 0911 10/30/18 1222   10/28/18 1330  cefTRIAXone (ROCEPHIN) 2 g in sodium chloride 0.9 % 100 mL IVPB  Status:  Discontinued     2 g 200 mL/hr over 30 Minutes Intravenous  Once 10/28/18 1316 10/28/18 1317   10/28/18 1330  vancomycin (VANCOCIN) IVPB 1000 mg/200 mL premix     1,000 mg 200 mL/hr over 60 Minutes Intravenous  Once 10/28/18 1318 10/28/18 1454   10/28/18 1330  ceFEPIme (MAXIPIME) 1 g in sodium chloride 0.9 % 100 mL IVPB  Status:  Discontinued     1 g 200 mL/hr over 30 Minutes Intravenous Every 24 hours 10/28/18 1326 10/29/18 0910   10/28/18 1318  vancomycin variable dose per unstable renal function (pharmacist dosing)  Status:  Discontinued      Does not apply See admin instructions 10/28/18 1318 10/29/18 0911       Assessment/Plan Dementia, non-verbal at baseline Hx of CVA HTN CKD - Cr elevated from baseline. 1.85 (3/25)  SBO with concerns on CT for internal hernia - Ng tube placed. Started on SBO protocol 3/23. Xrays with persistent SBO -- Keep K > 4 and Mg >2 for bowel function - Appreciate palliative consult. Will continue to treat medically and follow - we are certainly available to discuss with family as well alongside palliative care. We continue to try to reach family via phone to no avail but will continue to try and if at hospital happy to meet in persona as well   FEN: NPO, NG tube, IVF, K 3.2 VTE: SCDs, Heparin ID: Rocephin 3/20. Vanc, cefepime 3/20 - 3/21, Ancef 3/21 - 3/22. Zosyn 3/23 - 3/29  WBC 14.4 (3/24)    LOS: 5 days    Stephanie Coup [facility] 11/03/2018, 8:12 AM Pager: [phone]

## 2018-11-03 NOTE — Progress Notes (Signed)
Pt. Began to desat into the low 80s and pulse rate went up to the 190s. Pt was suctioned which brought sats back up. Pt's NG tube came out which is what caused hypoxia and tachycardia. On call notified and RR Nurse came to assist with reinsertion of NG tube. Pt's sats are now back in the 90s and pulse is low 100s and steady. Will continue to monitor.

## 2018-11-03 NOTE — Progress Notes (Signed)
Husband and son at bedside.  

## 2018-11-03 NOTE — Progress Notes (Addendum)
Palliative:   Mrs. Connel is resting quietly in bed.  Her eyes are open, but she does not look in my direction or try to interact with me in any way.  She appears acutely/chronically ill and frail. There is no family at bedside at this time.   Return to bedside later in afternoon.  Nursing staff at bedside attending to patient needs. No changes in status.  Nursing shares that they have not seen family, but anticipate calling surgery to speak with family when they arrive.    Call to husband at [phone].  Left supportive generic voicemail message.   Anticipate that Mr. Arcari will continue to have difficulty choosing to focus on comfort only.  Anticipate that Mrs. Humphrey will worsen and experience an in hospital death.   Conference with hospitalist related to symptom management and attempted family contact.  40 minutes  Lillia Carmel, NP Palliative Medicine Team [phone] Greater than 50%  of this time was spent counseling and coordinating care related to the above assessment and plan.

## 2018-11-03 NOTE — Progress Notes (Addendum)
PROGRESS NOTE    Amanda [name]  WGN:[id number] DOB: [birth year] DOA: 10/28/2018 PCP: Pecola Lawless, MD    Brief Narrative:  Patient is a 73 y.o. female with history of CVA, dementia, s/p aortic aneurysm repair-Per H&P-nonverbal at baseline-resident of SNF-presented to the ED for evaluation of fever, vomiting-initially thought to have MSSA bacteremia-however further culture results are positive for coag negative staph bacteremia.  Subsequently on 3/23-started having projectile vomiting-CT abdomen showing small bowel obstruction, also thought to have aspiration pneumonia and resultant respiratory failure   Assessment & Plan:   Active Problems:   Essential hypertension, benign   Dementia with behavioral disturbance (HCC)   Anemia of chronic disease   History of stroke   Dehydration with hypernatremia   Alzheimer disease (HCC)   SIRS (systemic inflammatory response syndrome) (HCC)   MSSA bacteremia   Small bowel obstruction (HCC)   Goals of care, counseling/discussion   Palliative care by specialist  Acute hypoxic respiratory failure: Suspect secondary to aspiration pneumonia and abdominal distention.  Patient currently remains on Zosyn to cover for aspiration pneumonia.  She is n.p.o. with NG tube.  She is currently on 3 to 4 L of oxygen that she will continue.  Patient is not following commands for deep breathing exercises.  Continue current level of care.  Will keep on oxygen to keep saturations more than 90%.  Sepsis: Present on admission.  Probably due to above. Resolving.  Coag negative Staphylococcus is contaminant.  Small bowel obstruction with possible internal hernia: Patient is treated with n.p.o., NG tube decompression.  Persistent small bowel obstruction.  No return of bowel function yet.  Followed by surgery.  Suggested conservative management.  Electrolytes are sufficient. Not a surgical candidate as per surgery.  Palliative care is also following.  Appreciate  their help.  Acute kidney injury on chronic kidney disease stage III: Patient has some deterioration of renal functions today.  Adequate urine output. We will give 1 L bolus normal saline and resume her dextrose normal saline at maintenance.  Continue to check intake and output.  Acute metabolic encephalopathy: Unknown how much it is baseline.  Patient is nonverbal and nonambulatory at baseline.  No focal neurological deficit.  Hypokalemia: Replaced and resolved.  We will continue to check.  Hypertension: Unable to take oral medications.  Blood pressures are fairly stable.  On clonidine patch.  On as needed medications to keep blood pressures less than 180.  Stage II sacral ulcer: Present on admission.  Wound care following.  Dementia/CVA with nonverbal and nonambulatory status and contractures: Appreciate palliative care input.  Family agreed on DNR.  Unsure when patient will be able to eat.  Patient is appropriate for hospice level of care if agreed by family.  Addendum:  Mr Duanne Guess called by . I updated him. He will visit hospital later today.  DVT prophylaxis: Heparin subcu. Code Status: DNR. Family Communication: No family at bedside.  Called patient's son, unable to talk.  Will try again. Disposition Plan: SNF versus hospice facility.   Consultants:   General surgery  Procedures:   None  Antimicrobials:   IV Zosyn, 3/23 ongoing   Subjective: Patient was seen and examined.  Overnight events noted.  Patient had an episode of hypoxia and discomfort that improved with adjustment of NG tube.  No other overnight events. Patient is nonverbal and unable to communicate.  She winces on abdominal palpation but do not react or interact.  No bowel movements noted.  About 1800 mL  output from NG tube.  Objective: Vitals:   11/02/18 2033 11/03/18 0016 11/03/18 0429 11/03/18 0432  BP: 118/89 102/88 (!) 195/150 140/80  Pulse: 98 98 86 90  Resp: Temp: 98.4 F (36.9  C) 98.3 F (36.8 C) 98.5 F (36.9 C)   TempSrc: Oral Oral Oral   SpO2: 97% 97% 100%   Weight:      Height:        Intake/Output Summary (Last 24 hours) at 11/03/2018 0934 Last data filed at 11/03/2018 0727 Gross per 24 hour  Intake 915.36 ml  Output 2500 ml  Net -1584.64 ml   Filed Weights   10/28/18 0959 10/28/18 1530  Weight: 50.9 kg 45.5 kg    Examination:  General exam: Appears calm and comfortable.  Patient is nonverbal and no interaction.  She looks comfortable on 4 L oxygen. Respiratory system: Clear to auscultation. Respiratory effort normal.  No additional sounds. Cardiovascular system: S1 & S2 heard, RRR. No JVD, murmurs, rubs, gallops or clicks. No pedal edema. Gastrointestinal system: Abdomen is nondistended, soft and nontender. No organomegaly or masses felt.  No bowel sounds.  No rigidity or guarding.  Patient winces on deep palpation of the abdomen. Central nervous system: Patient is sleepy lethargic and noninteractive. Extremities: Contracted.  Difficult to examine motor power.  Does not follow commands. Skin: No rashes, reportedly stage II on sacrum. Psychiatry: Sleepy lethargic and flat affect.    Data Reviewed: I have personally reviewed following labs and imaging studies  CBC: Recent Labs  Lab 10/28/18 1020  10/31/18 0729 10/31/18 0846 11/01/18 0724 11/02/18 0354 11/03/18 0409  WBC 13.4*   < > 11.7* 11.9* 13.9* 14.4* 16.6*  NEUTROABS 10.8*  --   --   --   --   --   --   HGB 14.4   < > 12.8 12.8 11.8* 11.6* 13.5  HCT 47.2*   < > 41.3 39.1 36.5 35.5* 40.6  MCV 95.2   < > 93.2 91.8 91.5 90.8 92.3  PLT 260   < > 209 201 233 278 292   < > = values in this interval not displayed.   Basic Metabolic Panel: Recent Labs  Lab 10/30/18 0454 10/31/18 0729 11/01/18 0724 11/02/18 0354 11/03/18 0409  NA 142 141 140 138 140  K 3.2* 3.6 3.0* 3.2* 4.8  CL 110 110 104 103 103  CO2 21* 20* 20* 20* 24  GLUCOSE 96 125* 104* 107* 171*  BUN 19 19 26* 24*  31*  CREATININE 1.23* 1.31* 1.71* 1.85* 2.30*  CALCIUM 8.9 8.7* 8.9 9.0 9.4  MG  --   --   --   --  2.0   GFR: Estimated Creatinine Clearance: 15.2 mL/min (A) (by C-G formula based on SCr of 2.3 mg/dL (H)). Liver Function Tests: Recent Labs  Lab 10/28/18 1020  AST 24  ALT 22  ALKPHOS 75  BILITOT 0.8  PROT 8.3*  ALBUMIN 3.8   Recent Labs  Lab 10/31/18 0846  LIPASE 42   No results for input(s): AMMONIA in the last 168 hours. Coagulation Profile: No results for input(s): INR, PROTIME in the last 168 hours. Cardiac Enzymes: No results for input(s): CKTOTAL, CKMB, CKMBINDEX, TROPONINI in the last 168 hours. BNP (last 3 results) No results for input(s): PROBNP in the last 8760 hours. HbA1C: No results for input(s): HGBA1C in the last 72 hours. CBG: No results for input(s): GLUCAP in the last 168 hours. Lipid Profile: No results  for input(s): CHOL, HDL, LDLCALC, TRIG, CHOLHDL, LDLDIRECT in the last 72 hours. Thyroid Function Tests: No results for input(s): TSH, T4TOTAL, FREET4, T3FREE, THYROIDAB in the last 72 hours. Anemia Panel: No results for input(s): VITAMINB12, FOLATE, FERRITIN, TIBC, IRON, RETICCTPCT in the last 72 hours. Sepsis Labs: Recent Labs  Lab 10/28/18 1020  LATICACIDVEN 1.4    Recent Results (from the past 240 hour(s))  Blood Culture (routine x 2)     Status: None   Collection Time: 10/28/18 10:08 AM  Result Value Ref Range Status   Specimen Description BLOOD LEFT HAND  Final   Special Requests   Final    BOTTLES DRAWN AEROBIC ONLY Blood Culture results may not be optimal due to an inadequate volume of blood received in culture bottles   Culture   Final    NO GROWTH 5 DAYS Performed at [facility], [address], [place], [place] [address]    Report Status 11/02/2018 FINAL  Final  Blood Culture (routine x 2)     Status: Abnormal   Collection Time: 10/28/18 10:20 AM  Result Value Ref Range Status   Specimen Description BLOOD LEFT HAND   Final   Special Requests   Final    BOTTLES DRAWN AEROBIC AND ANAEROBIC Blood Culture adequate volume   Culture  Setup Time   Final    GRAM POSITIVE COCCI ANAEROBIC BOTTLE ONLY CRITICAL RESULT CALLED TO, READ BACK BY AND VERIFIED WITH: PHARMD ANNA LOVE [phone] FCP    Culture (A)  Final    STAPHYLOCOCCUS SPECIES (COAGULASE NEGATIVE) THE SIGNIFICANCE OF ISOLATING THIS ORGANISM FROM A SINGLE SET OF BLOOD CULTURES WHEN MULTIPLE SETS ARE DRAWN IS UNCERTAIN. PLEASE NOTIFY THE MICROBIOLOGY DEPARTMENT WITHIN ONE WEEK IF SPECIATION AND SENSITIVITIES ARE REQUIRED. Performed at [facility], [address], [place], [place] [address]    Report Status 10/31/2018 FINAL  Final  Blood Culture ID Panel (Reflexed)     Status: Abnormal   Collection Time: 10/28/18 10:20 AM  Result Value Ref Range Status   Enterococcus species NOT DETECTED NOT DETECTED Final   Listeria monocytogenes NOT DETECTED NOT DETECTED Final   Staphylococcus species DETECTED (A) NOT DETECTED Final    Comment: Methicillin (oxacillin) susceptible coagulase negative staphylococcus. Possible blood culture contaminant (unless isolated from more than one blood culture draw or clinical case suggests pathogenicity). No antibiotic treatment is indicated for blood  culture contaminants. CRITICAL RESULT CALLED TO, READ BACK BY AND VERIFIED WITH: PHARMD ANNA LOVE [phone] FCP    Staphylococcus aureus (BCID) NOT DETECTED NOT DETECTED Final   Methicillin resistance NOT DETECTED NOT DETECTED Final   Streptococcus species NOT DETECTED NOT DETECTED Final   Streptococcus agalactiae NOT DETECTED NOT DETECTED Final   Streptococcus pneumoniae NOT DETECTED NOT DETECTED Final   Streptococcus pyogenes NOT DETECTED NOT DETECTED Final   Acinetobacter baumannii NOT DETECTED NOT DETECTED Final   Enterobacteriaceae species NOT DETECTED NOT DETECTED Final   Enterobacter cloacae complex NOT DETECTED NOT DETECTED Final   Escherichia coli NOT  DETECTED NOT DETECTED Final   Klebsiella oxytoca NOT DETECTED NOT DETECTED Final   Klebsiella pneumoniae NOT DETECTED NOT DETECTED Final   Proteus species NOT DETECTED NOT DETECTED Final   Serratia marcescens NOT DETECTED NOT DETECTED Final   Haemophilus influenzae NOT DETECTED NOT DETECTED Final   Neisseria meningitidis NOT DETECTED NOT DETECTED Final   Pseudomonas aeruginosa NOT DETECTED NOT DETECTED Final   Candida albicans NOT DETECTED NOT DETECTED Final  Candida glabrata NOT DETECTED NOT DETECTED Final   Candida krusei NOT DETECTED NOT DETECTED Final   Candida parapsilosis NOT DETECTED NOT DETECTED Final   Candida tropicalis NOT DETECTED NOT DETECTED Final    Comment: Performed at [facility], [address], [place], [place] [address]  Urine culture     Status: Abnormal   Collection Time: 10/28/18 12:15 PM  Result Value Ref Range Status   Specimen Description URINE, CLEAN CATCH  Final   Special Requests NONE  Final   Culture (A)  Final    <10,000 COLONIES/mL INSIGNIFICANT GROWTH Performed at [facility], [address], [place], [place] [address]    Report Status 10/29/2018 FINAL  Final  MRSA PCR Screening     Status: None   Collection Time: 10/28/18  5:58 PM  Result Value Ref Range Status   MRSA by PCR NEGATIVE NEGATIVE Final    Comment:        The GeneXpert MRSA Assay (FDA approved for NASAL specimens only), is one component of a comprehensive MRSA colonization surveillance program. It is not intended to diagnose MRSA infection nor to guide or monitor treatment for MRSA infections. Performed at [facility], [address], [place], [place] [address]   Culture, blood (routine x 2)     Status: None (Preliminary result)   Collection Time: 10/29/18 12:39 PM  Result Value Ref Range Status   Specimen Description BLOOD LEFT WRIST  Final   Special Requests   Final    BOTTLES DRAWN AEROBIC ONLY Blood Culture adequate volume   Culture   Final    NO  GROWTH 4 DAYS Performed at [facility], [address] [place], [place] [address]    Report Status PENDING  Incomplete  Culture, blood (routine x 2)     Status: None (Preliminary result)   Collection Time: 10/29/18 12:39 PM  Result Value Ref Range Status   Specimen Description BLOOD LEFT HAND  Final   Special Requests   Final    BOTTLES DRAWN AEROBIC ONLY Blood Culture adequate volume   Culture   Final    NO GROWTH 4 DAYS Performed at [facility], [address] [place], [place] [address]    Report Status PENDING  Incomplete         Radiology Studies: Dg Chest [facility] 1v Same Day  Result Date: 11/02/2018 CLINICAL DATA:  Shortness of breath. EXAM: PORTABLE CHEST 1 VIEW COMPARISON:  Radiograph of October 31, 2018. CT scan of August 16, 2017. FINDINGS: Stable cardiomediastinal silhouette is noted with stable right cardiac border bulge consistent with thoracic aortic pseudoaneurysm. Atherosclerosis of thoracic aorta is noted. Interval placement of nasogastric tube with tip in expected position of proximal stomach. No pneumothorax or pleural effusion is noted. Mild left lingular subsegmental atelectasis is noted. Mild right middle lobe subsegmental atelectasis or infiltrate is noted. Bony thorax is unremarkable. IMPRESSION: Mild left lingular subsegmental atelectasis. Mild right middle lobe subsegmental atelectasis or infiltrate is noted. Stable bulge involving right cardiac border consistent with history of ascending thoracic aortic pseudoaneurysm. Electronically Signed   By: Lupita Raider, M.D.   On: 11/02/2018 08:19        Scheduled Meds: . calcium-vitamin D   Oral Daily  . cloNIDine  0.2 mg Transdermal Weekly  . heparin  5,000 Units Subcutaneous Q8H  . multivitamin with minerals  1 tablet Oral Daily  . senna-docusate  1 tablet Oral BID  Continuous Infusions: . dextrose 5 % and 0.45 % NaCl with KCl 40 mEq/L 100 mL/hr at 11/03/18 0125  . piperacillin-tazobactam  (ZOSYN)  IV 3.375 g (11/03/18 0842)     LOS: 5 days    Time spent: 25    Dorcas Carrow, MD [facility] Pager [phone]  If 7PM-7AM, please contact night-coverage [email or web address] Password [facility] 11/03/2018, 9:34 AM

## 2018-11-04 LAB — BASIC METABOLIC PANEL
Anion gap: 13 (ref 5–15)
BUN: 31 mg/dL — ABNORMAL HIGH (ref 8–23)
CO2: 27 mmol/L (ref 22–32)
Calcium: 9.1 mg/dL (ref 8.9–10.3)
Chloride: 106 mmol/L (ref 98–111)
Creatinine, Ser: 2.71 mg/dL — ABNORMAL HIGH (ref 0.44–1.00)
GFR calc Af Amer: 19 mL/min — ABNORMAL LOW (ref >60)
GFR calc non Af Amer: 16 mL/min — ABNORMAL LOW (ref >60)
Glucose, Bld: 129 mg/dL — ABNORMAL HIGH (ref 70–99)
Potassium: 4.6 mmol/L (ref 3.5–5.1)
Sodium: 146 mmol/L — ABNORMAL HIGH (ref 135–145)

## 2018-11-04 LAB — CBC WITH DIFFERENTIAL/PLATELET
Abs Immature Granulocytes: 0.21 10*3/uL — ABNORMAL HIGH (ref 0.00–0.07)
Basophils Absolute: 0 10*3/uL (ref 0.0–0.1)
Basophils Relative: 0 %
EOS PCT: 1 %
Eosinophils Absolute: 0.2 10*3/uL (ref 0.0–0.5)
HCT: 32 % — ABNORMAL LOW (ref 36.0–46.0)
HEMOGLOBIN: 10 g/dL — AB (ref 12.0–15.0)
Immature Granulocytes: 1 %
LYMPHS PCT: 13 %
Lymphs Abs: 2.1 10*3/uL (ref 0.7–4.0)
MCH: 29.4 pg (ref 26.0–34.0)
MCHC: 31.3 g/dL (ref 30.0–36.0)
MCV: 94.1 fL (ref 80.0–100.0)
Monocytes Absolute: 1.2 10*3/uL — ABNORMAL HIGH (ref 0.1–1.0)
Monocytes Relative: 8 %
Neutro Abs: 12.2 10*3/uL — ABNORMAL HIGH (ref 1.7–7.7)
Neutrophils Relative %: 77 %
Platelets: 308 10*3/uL (ref 150–400)
RBC: 3.4 MIL/uL — ABNORMAL LOW (ref 3.87–5.11)
RDW: 13.8 % (ref 11.5–15.5)
WBC: 16 10*3/uL — ABNORMAL HIGH (ref 4.0–10.5)
nRBC: 0 % (ref 0.0–0.2)

## 2018-11-04 MED ORDER — MORPHINE SULFATE (CONCENTRATE) 10 MG/0.5ML PO SOLN
2.5000 mg | ORAL | Status: DC | PRN
  Administered 2018-11-04: 5 mg
  Filled 2018-11-04: qty 0.5

## 2018-11-04 MED ORDER — GLYCOPYRROLATE 0.2 MG/ML IJ SOLN: 0.2000 mg | Freq: Four times a day (QID) | INTRAMUSCULAR | Status: DC | PRN

## 2018-11-04 MED ORDER — GLYCOPYRROLATE 0.2 MG/ML IJ SOLN
0.2000 mg | Freq: Four times a day (QID) | INTRAMUSCULAR | Status: DC
  Administered 2018-11-04 – 2018-11-05 (×5): 0.2 mg via INTRAVENOUS
  Filled 2018-11-04 (×5): qty 1

## 2018-11-04 MED ORDER — MORPHINE SULFATE (PF) 2 MG/ML IV SOLN
1.0000 mg | INTRAVENOUS | Status: DC | PRN
  Administered 2018-11-04 – 2018-11-05 (×3): 2 mg via INTRAVENOUS
  Filled 2018-11-04 (×3): qty 1

## 2018-11-04 NOTE — Progress Notes (Signed)
Palliative Medicine RN Note: Rec'd calls from SW Nadya & RN that family is ready for [place] & that surgery just spoke with them. I let both SW & RN know that PMT was not aware of a meeting today; I will page our team to see if anyone is available to meet with the family.   Margret Chance Ameet Sandy, RN, BSN, [facility] Palliative Medicine Team 11/04/2018 12:29 PM Office [phone]

## 2018-11-04 NOTE — TOC Initial Note (Signed)
Transition of Care [facility]) - Initial/Assessment Note    Patient Details  Name: Amanda [name] MRN: [id number] Date of Birth: 06-30-1946  Transition of Care [facility]) CM/SW Contact:    Mearl Latin, LCSW Phone Number: 11/04/2018, 4:16 PM  Clinical Narrative:                 CSW received referral for residential hospice at [facility]. They are reviewing referral and checking bed availability.   Expected Discharge Plan: Hospice Medical Facility Barriers to Discharge: Other (comment)(Hospice bed)   Patient Goals and CMS Choice Patient states their goals for this hospitalization and ongoing recovery are:: Comfort CMS Medicare.gov Compare Post Acute Care list provided to:: Other (Comment Required)(Spouse) Choice offered to / list presented to : Spouse, Adult Children  Expected Discharge Plan and Services Expected Discharge Plan: Hospice Medical Facility In-house Referral: Clinical Social Work, Hospice / Palliative Care Discharge Planning Services: NA Post Acute Care Choice: Hospice Living arrangements for the past 2 months: Skilled Nursing Facility Expected Discharge Date: 11/04/18               DME Arranged: N/A DME Agency: NA HH Arranged: NA HH Agency: NA  Prior Living Arrangements/Services Living arrangements for the past 2 months: Skilled Nursing Facility Lives with:: Facility Resident Patient language and need for interpreter reviewed:: Yes Do you feel safe going back to the place where you live?: No      Need for Family Participation in Patient Care: Yes (Comment) Care giver support system in place?: Yes (comment)   Criminal Activity/Legal Involvement Pertinent to Current Situation/Hospitalization: No - Comment as needed  Activities of Daily Living      Permission Sought/Granted Permission sought to share information with : Facility Medical sales representative, Family Supports Permission granted to share information with : Yes, Verbal Permission Granted         Permission granted to share info w Relationship: Spouse/Son     Emotional Assessment Appearance:: Appears stated age Attitude/Demeanor/Rapport: Unable to Assess Affect (typically observed): Unable to Assess Orientation: : (Not alert) Alcohol / Substance Use: Not Applicable Psych Involvement: No (comment)  Admission diagnosis:  FUO (fever of unknown origin) [R50.9] Chest pain [R07.9] Patient Active Problem List   Diagnosis Date Noted  . SBO (small bowel obstruction) (HCC)   . Goals of care, counseling/discussion   . Palliative care by specialist   . MSSA bacteremia 10/29/2018  . SIRS (systemic inflammatory response syndrome) (HCC) 11/13/2017  . Malnutrition of moderate degree 11/11/2017  . Hypernatremia 11/10/2017  . Acute metabolic encephalopathy 11/10/2017  . Alzheimer disease (HCC)   . Hospice care   . Aspiration pneumonia (HCC)   . Palliative care encounter   . Decubitus ulcer of back, stage 2 (HCC) 08/12/2017  . History of stroke 08/11/2017  . Acute confusional state 08/11/2017  . Dehydration with hypernatremia 08/11/2017  . Chronic constipation 09/17/2016  . History of gout 08/06/2016  . Anemia of chronic disease 10/08/2014  . Allergic rhinitis 11/24/2013  . Hyperlipidemia 02/27/2013  . Anxiety state 12/12/2012  . Vitamin D deficiency 12/12/2012  . Acute renal failure superimposed on stage 3 chronic kidney disease (HCC) 05/17/2012  . Essential hypertension, benign   . Dementia with behavioral disturbance (HCC)    PCP:  Pecola Lawless, MD Pharmacy:   [facility] [phone] - [place], [place] - [phone] AT [facility] [address] & [address] [place] [address] Phone: [phone] Fax: [phone]     Social Determinants  of Health (SDOH) Interventions    Readmission Risk Interventions Readmission Risk Prevention Plan 11/04/2018  Transportation Screening Complete  PCP or Specialist Appt within 5-7 Days Complete   Home Care Screening Complete  Medication Review (RN CM) Complete  Some recent data might be hidden

## 2018-11-04 NOTE — Progress Notes (Signed)
Subjective: CC:  Resting comfortably in bed  Objective: Vital signs in last 24 hours: Temp:  [98.2 F (36.8 C)-98.7 F (37.1 C)] 98.7 F (37.1 C) (03/27 0434) Pulse Rate:  [73-98] 77 (03/27 0434) Resp:  [15-24] 15 (03/27 0434) BP: (132-148)/(95-103) 132/95 (03/27 0434) SpO2:  [96 %-100 %] 100 % (03/27 0434) Last BM Date: (pta)  Intake/Output from previous day: 03/26 0701 - 03/27 0700 In: 2031.6 [I.V.:1886.3; IV Piggyback:145.3] Out: 3050 [Urine:350; Emesis/NG output:2700] Intake/Output this shift: No intake/output data recorded.  PE: Gen: resting comfortably. Easily awoken Heart: Regular Lungs: Normal effort Abd: Mild distension, soft, no obvious tenderness or grimacing with palpation. No rigidity or guarding . Hypoactive bowel sounds. Msk: Chronic contractures of upper and lower extremities. Unable to lie flat 2/2 to this.    NGT 1.8L/24hrs, bilious  Lab Results:  Recent Labs    11/03/18 0409 11/04/18 0236  WBC 16.6* 16.0*  HGB 13.5 10.0*  HCT 40.6 32.0*  PLT 292 308   BMET Recent Labs    11/03/18 0409 11/04/18 0236  NA 140 146*  K 4.8 4.6  CL 103 106  CO2 24 27  GLUCOSE 171* 129*  BUN 31* 31*  CREATININE 2.30* 2.71*  CALCIUM 9.4 9.1   PT/INR No results for input(s): LABPROT, INR in the last 72 hours. CMP     Component Value Date/Time   NA 146 (H) 11/04/2018 0236   NA 143 12/17/2016   K 4.6 11/04/2018 0236   CL 106 11/04/2018 0236   CO2 27 11/04/2018 0236   GLUCOSE 129 (H) 11/04/2018 0236   BUN 31 (H) 11/04/2018 0236   BUN 18 12/17/2016   CREATININE 2.71 (H) 11/04/2018 0236   CALCIUM 9.1 11/04/2018 0236   PROT 8.3 (H) 10/28/2018 1020   ALBUMIN 3.8 10/28/2018 1020   AST 24 10/28/2018 1020   ALT 22 10/28/2018 1020   ALKPHOS 75 10/28/2018 1020   BILITOT 0.8 10/28/2018 1020   GFRNONAA 16 (L) 11/04/2018 0236   GFRAA 19 (L) 11/04/2018 0236   Lipase     Component Value Date/Time   LIPASE 42 10/31/2018 0846        Studies/Results: Dg Chest 1 View  Result Date: 11/03/2018 CLINICAL DATA:  Cough. EXAM: CHEST  1 VIEW COMPARISON:  Radiograph of November 02, 2018. FINDINGS: Stable cardiomediastinal silhouette is noted with focal bulge seen along right cardiac border consistent with history ascending thoracic aortic pseudoaneurysm. Atherosclerosis of thoracic aorta is noted. Nasogastric tube is in grossly good position. No pneumothorax or pleural effusion is noted. Lingular opacity noted on prior exam appears to have resolved. Left lung is clear currently. Mild right perihilar and basilar opacity is noted concerning for edema or possibly infiltrate. Bony thorax is unremarkable. IMPRESSION: Left lingular opacity noted on prior exam has resolved. Mild right perihilar and basilar opacity is noted concerning for edema or possibly infiltrate. Electronically Signed   By: Lupita Raider, M.D.   On: 11/03/2018 11:37   Dg Abd 2 Views  Result Date: 11/03/2018 CLINICAL DATA:  73 year old female with cough EXAM: ABDOMEN - 2 VIEW COMPARISON:  CT abdomen 10/31/2018, CT chest 08/16/2017, plain film 11/02/2018 FINDINGS: Cardiomediastinal silhouette is unchanged with surgical changes of median sternotomy. Contour of ascending aortic aneurysm along the right mediastinum is unchanged. Gastric tube terminates within the stomach. Lower lungs are relatively clear. Gas within small bowel and colon, with borderline distention of small bowel loops. Degenerative changes of the spine. Dense calcifications  of the abdominal aorta. Osteopenia IMPRESSION: Gastric tube terminates within the stomach. Borderline small bowel gaseous distention may indicate ileus/persisting small bowel obstruction. Electronically Signed   By: Gilmer Mor D.O.   On: 11/03/2018 11:38    Anti-infectives: Anti-infectives (From admission, onward)   Start     Dose/Rate Route Frequency Ordered Stop   11/03/18 1700  piperacillin-tazobactam (ZOSYN) IVPB 2.25 g     2.25 g 100  mL/hr over 30 Minutes Intravenous Every 8 hours 11/03/18 1231 11/06/18 0906   10/31/18 0900  piperacillin-tazobactam (ZOSYN) IVPB 3.375 g  Status:  Discontinued     3.375 g 12.5 mL/hr over 240 Minutes Intravenous Every 8 hours 10/31/18 0830 11/03/18 1231   10/29/18 1000  ceFAZolin (ANCEF) IVPB 2g/100 mL premix  Status:  Discontinued     2 g 200 mL/hr over 30 Minutes Intravenous Every 12 hours 10/29/18 0911 10/30/18 1222   10/28/18 1330  cefTRIAXone (ROCEPHIN) 2 g in sodium chloride 0.9 % 100 mL IVPB  Status:  Discontinued     2 g 200 mL/hr over 30 Minutes Intravenous  Once 10/28/18 1316 10/28/18 1317   10/28/18 1330  vancomycin (VANCOCIN) IVPB 1000 mg/200 mL premix     1,000 mg 200 mL/hr over 60 Minutes Intravenous  Once 10/28/18 1318 10/28/18 1454   10/28/18 1330  ceFEPIme (MAXIPIME) 1 g in sodium chloride 0.9 % 100 mL IVPB  Status:  Discontinued     1 g 200 mL/hr over 30 Minutes Intravenous Every 24 hours 10/28/18 1326 10/29/18 0910   10/28/18 1318  vancomycin variable dose per unstable renal function (pharmacist dosing)  Status:  Discontinued      Does not apply See admin instructions 10/28/18 1318 10/29/18 0911       Assessment/Plan Dementia, non-verbal at baseline Hx of CVA HTN CKD - Cr elevated from baseline. 1.85 (3/25)  SBO with concerns on CT for internal hernia - Ng tube placed - Appreciate palliative consult. Will continue to treat medically and follow - I had a long discussion in person with her husband and son today. We discussed options moving forward and given her overall health and poor condition they recognize that surgery carries substantial risk of worsening her quality of life. We discussed she may sustain mortality without surgery but that surgery carries substantial risk to her including potential survival with subsequent poorer quality of life. They have opted to pursue further observation and comfort measures at this time.  Would continue NG tube for time  being; if process does not resolve on it's own in coming days, palliative care discussing potential hospice options moving forward and continuing NG at that time  Given overall plan and their opting not to pursue surgery (which in this case I agree seems to be in line with overall goals) we will sign off for now. We remain available if questions/concerns arise  FEN: NPO, NG tube, IVF, K 3.2 VTE: SCDs, Heparin ID: Rocephin 3/20. Vanc, cefepime 3/20 - 3/21, Ancef 3/21 - 3/22. Zosyn 3/23 - 3/29  WBC 14.4 (3/24)    LOS: 6 days    Stephanie Coup [facility] 11/04/2018, 12:54 PM Pager: [phone]

## 2018-11-04 NOTE — Progress Notes (Signed)
PROGRESS NOTE    Amanda [name]  GYB:[id number] DOB: [birth year] DOA: 10/28/2018 PCP: Hendricks Limes, MD    Brief Narrative:  Patient is a 73 y.o. female with history of CVA, dementia, s/p aortic aneurysm repair-Per H&P-nonverbal at baseline-resident of SNF-presented to the ED for evaluation of fever, vomiting-initially thought to have MSSA bacteremia-however further culture results are positive for coag negative staph bacteremia.  Subsequently on 3/23-started having projectile vomiting-CT abdomen showing small bowel obstruction, also thought to have aspiration pneumonia and resultant respiratory failure   Assessment & Plan:   Active Problems:   Essential hypertension, benign   Dementia with behavioral disturbance (HCC)   Anemia of chronic disease   History of stroke   Dehydration with hypernatremia   Decubitus ulcer of back, stage 2 (HCC)   Alzheimer disease (HCC)   SIRS (systemic inflammatory response syndrome) (HCC)   MSSA bacteremia   SBO (small bowel obstruction) ([place])   Goals of care, counseling/discussion   Palliative care by specialist  Acute hypoxic respiratory failure: Suspect secondary to aspiration pneumonia and abdominal distention.  Patient currently remains on Zosyn to cover for aspiration pneumonia.  She is n.p.o. with NG tube.  She is currently on 3 L of oxygen that she will continue. Continue current level of care.  Will keep on oxygen to keep saturations more than 90%.  Sepsis: Present on admission.  Probably due to above. Resolving.  Coag negative Staphylococcus is contaminant.  Small bowel obstruction with possible internal hernia: Patient is treated with n.p.o., NG tube decompression.  Persistent small bowel obstruction.  No return of bowel function yet.  Followed by surgery.  Suggested conservative management.  Electrolytes are sufficient. Not a surgical candidate as per surgery.  Palliative care is also following.  Appreciate their help.  Acute kidney  injury on chronic kidney disease stage III: Patient has worsening renal functions today.  Adequate urine output.  Continue maintenance IV fluids.  Bladder scan.  If retention, will need Foley catheter.  Acute metabolic encephalopathy: Unknown how much it is baseline.  Patient is nonverbal and nonambulatory at baseline.  No focal neurological deficit.  Hypokalemia: Replaced and resolved.  We will continue to check.  Hypertension: Unable to take oral medications.  Blood pressures are fairly stable.  On clonidine patch.  On as needed medications to keep blood pressures less than 180.  Stage II sacral ulcer: Present on admission.  Wound care following.  Dementia/CVA with nonverbal and nonambulatory status and contractures: Appreciate palliative care input.  Family agreed on DNR.  Unsure when patient will be able to eat.  Patient is appropriate for hospice level of care if agreed by family.  I have met with patient's family at the bedside.  Recommended discussion with palliative and hospice.  Family is receptive per discussion.  DVT prophylaxis: Heparin subcu. Code Status: DNR. Family Communication: Husband and son at the bedside. Disposition Plan: SNF versus hospice facility.   Consultants:   General surgery  Palliative.  Procedures:   None  Antimicrobials:   IV Zosyn, 3/23 ongoing   Subjective: Patient was seen and examined.  No overnight events.  She remains lethargic.  Today she is opening her eyes, however not interactive.  Husband says that she is more or less like this. NG output 2700, no bowel movements.  Objective: Vitals:   11/03/18 1247 11/03/18 1805 11/03/18 2130 11/04/18 0434  BP: 133/74 (!) 148/99 (!) 148/103 (!) 132/95  Pulse: 89 73 98 77  Resp:  (!) 24  15 15  Temp: 97.9 F (36.6 C) 98.2 F (36.8 C) 98.2 F (36.8 C) 98.7 F (37.1 C)  TempSrc: Axillary Oral Oral Oral  SpO2: 97% 100% 96% 100%  Weight:      Height:        Intake/Output Summary (Last 24  hours) at 11/04/2018 1044 Last data filed at 11/03/2018 2134 Gross per 24 hour  Intake 2031.64 ml  Output 2350 ml  Net -318.36 ml   Filed Weights   10/28/18 0959 10/28/18 1530  Weight: 50.9 kg 45.5 kg    Examination:  General exam: Appears calm and somewhat anxious .  Patient is nonverbal and no interaction.  She looks comfortable on 4 L oxygen. Respiratory system: Clear to auscultation. Respiratory effort normal.  No additional sounds. Cardiovascular system: S1 & S2 heard, RRR. No JVD, murmurs, rubs, gallops or clicks. No pedal edema. Gastrointestinal system: Abdomen is nondistended, soft and nontender. No organomegaly or masses felt.  No bowel sounds.  No rigidity or guarding.  Patient winces on deep palpation of the abdomen. Central nervous system: Patient is sleepy lethargic and noninteractive. Extremities: Contracted.  Difficult to examine motor power.  Does not follow commands. Skin: No rashes, reportedly stage II on sacrum. Psychiatry: Sleepy lethargic and flat affect.    Data Reviewed: I have personally reviewed following labs and imaging studies  CBC: Recent Labs  Lab 10/31/18 0846 11/01/18 0724 11/02/18 0354 11/03/18 0409 11/04/18 0236  WBC 11.9* 13.9* 14.4* 16.6* 16.0*  NEUTROABS  --   --   --   --  12.2*  HGB 12.8 11.8* 11.6* 13.5 10.0*  HCT 39.1 36.5 35.5* 40.6 32.0*  MCV 91.8 91.5 90.8 92.3 94.1  PLT 201 233 278 292 384   Basic Metabolic Panel: Recent Labs  Lab 10/31/18 0729 11/01/18 0724 11/02/18 0354 11/03/18 0409 11/04/18 0236  NA 141 140 138 140 146*  K 3.6 3.0* 3.2* 4.8 4.6  CL 110 104 103 103 106  CO2 20* 20* 20* 24 27  GLUCOSE 125* 104* 107* 171* 129*  BUN 19 26* 24* 31* 31*  CREATININE 1.31* 1.71* 1.85* 2.30* 2.71*  CALCIUM 8.7* 8.9 9.0 9.4 9.1  MG  --   --   --  2.0  --    GFR: Estimated Creatinine Clearance: 12.9 mL/min (A) (by C-G formula based on SCr of 2.71 mg/dL (H)). Liver Function Tests: No results for input(s): AST, ALT,  ALKPHOS, BILITOT, PROT, ALBUMIN in the last 168 hours. Recent Labs  Lab 10/31/18 0846  LIPASE 42   No results for input(s): AMMONIA in the last 168 hours. Coagulation Profile: No results for input(s): INR, PROTIME in the last 168 hours. Cardiac Enzymes: No results for input(s): CKTOTAL, CKMB, CKMBINDEX, TROPONINI in the last 168 hours. BNP (last 3 results) No results for input(s): PROBNP in the last 8760 hours. HbA1C: No results for input(s): HGBA1C in the last 72 hours. CBG: No results for input(s): GLUCAP in the last 168 hours. Lipid Profile: No results for input(s): CHOL, HDL, LDLCALC, TRIG, CHOLHDL, LDLDIRECT in the last 72 hours. Thyroid Function Tests: No results for input(s): TSH, T4TOTAL, FREET4, T3FREE, THYROIDAB in the last 72 hours. Anemia Panel: No results for input(s): VITAMINB12, FOLATE, FERRITIN, TIBC, IRON, RETICCTPCT in the last 72 hours. Sepsis Labs: No results for input(s): PROCALCITON, LATICACIDVEN in the last 168 hours.  Recent Results (from the past 240 hour(s))  Blood Culture (routine x 2)     Status: None   Collection Time: 10/28/18  10:08 AM  Result Value Ref Range Status   Specimen Description BLOOD LEFT HAND  Final   Special Requests   Final    BOTTLES DRAWN AEROBIC ONLY Blood Culture results may not be optimal due to an inadequate volume of blood received in culture bottles   Culture   Final    NO GROWTH 5 DAYS Performed at [facility] [address] [place], [place] [address]    Report Status 11/02/2018 FINAL  Final  Blood Culture (routine x 2)     Status: Abnormal   Collection Time: 10/28/18 10:20 AM  Result Value Ref Range Status   Specimen Description BLOOD LEFT HAND  Final   Special Requests   Final    BOTTLES DRAWN AEROBIC AND ANAEROBIC Blood Culture adequate volume   Culture  Setup Time   Final    GRAM POSITIVE COCCI ANAEROBIC BOTTLE ONLY CRITICAL RESULT CALLED TO, READ BACK BY AND VERIFIED WITH: PHARMD ANNA LOVE [phone]  FCP    Culture (A)  Final    STAPHYLOCOCCUS SPECIES (COAGULASE NEGATIVE) THE SIGNIFICANCE OF ISOLATING THIS ORGANISM FROM A SINGLE SET OF BLOOD CULTURES WHEN MULTIPLE SETS ARE DRAWN IS UNCERTAIN. PLEASE NOTIFY THE MICROBIOLOGY DEPARTMENT WITHIN ONE WEEK IF SPECIATION AND SENSITIVITIES ARE REQUIRED. Performed at [facility], [place] [address] [place], [place] [address]    Report Status 10/31/2018 FINAL  Final  Blood Culture ID Panel (Reflexed)     Status: Abnormal   Collection Time: 10/28/18 10:20 AM  Result Value Ref Range Status   Enterococcus species NOT DETECTED NOT DETECTED Final   Listeria monocytogenes NOT DETECTED NOT DETECTED Final   Staphylococcus species DETECTED (A) NOT DETECTED Final    Comment: Methicillin (oxacillin) susceptible coagulase negative staphylococcus. Possible blood culture contaminant (unless isolated from more than one blood culture draw or clinical case suggests pathogenicity). No antibiotic treatment is indicated for blood  culture contaminants. CRITICAL RESULT CALLED TO, READ BACK BY AND VERIFIED WITH: PHARMD ANNA LOVE [phone] FCP    Staphylococcus aureus (BCID) NOT DETECTED NOT DETECTED Final   Methicillin resistance NOT DETECTED NOT DETECTED Final   Streptococcus species NOT DETECTED NOT DETECTED Final   Streptococcus agalactiae NOT DETECTED NOT DETECTED Final   Streptococcus pneumoniae NOT DETECTED NOT DETECTED Final   Streptococcus pyogenes NOT DETECTED NOT DETECTED Final   Acinetobacter baumannii NOT DETECTED NOT DETECTED Final   Enterobacteriaceae species NOT DETECTED NOT DETECTED Final   Enterobacter cloacae complex NOT DETECTED NOT DETECTED Final   Escherichia coli NOT DETECTED NOT DETECTED Final   Klebsiella oxytoca NOT DETECTED NOT DETECTED Final   Klebsiella pneumoniae NOT DETECTED NOT DETECTED Final   Proteus species NOT DETECTED NOT DETECTED Final   Serratia marcescens NOT DETECTED NOT DETECTED Final   Haemophilus influenzae NOT  DETECTED NOT DETECTED Final   Neisseria meningitidis NOT DETECTED NOT DETECTED Final   Pseudomonas aeruginosa NOT DETECTED NOT DETECTED Final   Candida albicans NOT DETECTED NOT DETECTED Final   Candida glabrata NOT DETECTED NOT DETECTED Final   Candida krusei NOT DETECTED NOT DETECTED Final   Candida parapsilosis NOT DETECTED NOT DETECTED Final   Candida tropicalis NOT DETECTED NOT DETECTED Final    Comment: Performed at [facility], [address], [place], [place] [address]  Urine culture     Status: Abnormal   Collection Time: 10/28/18 12:15 PM  Result Value Ref Range Status   Specimen Description URINE, CLEAN CATCH  Final  Special Requests NONE  Final   Culture (A)  Final    <10,000 COLONIES/mL INSIGNIFICANT GROWTH Performed at [facility], [place] [address] [place], [place] [address]    Report Status 10/29/2018 FINAL  Final  MRSA PCR Screening     Status: None   Collection Time: 10/28/18  5:58 PM  Result Value Ref Range Status   MRSA by PCR NEGATIVE NEGATIVE Final    Comment:        The GeneXpert MRSA Assay (FDA approved for NASAL specimens only), is one component of a comprehensive MRSA colonization surveillance program. It is not intended to diagnose MRSA infection nor to guide or monitor treatment for MRSA infections. Performed at [facility], [place] [address] [place], [place] [address]   Culture, blood (routine x 2)     Status: None   Collection Time: 10/29/18 12:39 PM  Result Value Ref Range Status   Specimen Description BLOOD LEFT WRIST  Final   Special Requests   Final    BOTTLES DRAWN AEROBIC ONLY Blood Culture adequate volume   Culture   Final    NO GROWTH 5 DAYS Performed at [facility], [address] [place], [place] [address]    Report Status 11/03/2018 FINAL  Final  Culture, blood (routine x 2)     Status: None   Collection Time: 10/29/18 12:39 PM  Result Value Ref Range Status   Specimen Description BLOOD LEFT HAND   Final   Special Requests   Final    BOTTLES DRAWN AEROBIC ONLY Blood Culture adequate volume   Culture   Final    NO GROWTH 5 DAYS Performed at [facility], [place] [address] [place], [place] [address]    Report Status 11/03/2018 FINAL  Final         Radiology Studies: Dg Chest 1 View  Result Date: 11/03/2018 CLINICAL DATA:  Cough. EXAM: CHEST  1 VIEW COMPARISON:  Radiograph of November 02, 2018. FINDINGS: Stable cardiomediastinal silhouette is noted with focal bulge seen along right cardiac border consistent with history ascending thoracic aortic pseudoaneurysm. Atherosclerosis of thoracic aorta is noted. Nasogastric tube is in grossly good position. No pneumothorax or pleural effusion is noted. Lingular opacity noted on prior exam appears to have resolved. Left lung is clear currently. Mild right perihilar and basilar opacity is noted concerning for edema or possibly infiltrate. Bony thorax is unremarkable. IMPRESSION: Left lingular opacity noted on prior exam has resolved. Mild right perihilar and basilar opacity is noted concerning for edema or possibly infiltrate. Electronically Signed   By: Marijo Conception, M.D.   On: 11/03/2018 11:37   Dg Abd 2 Views  Result Date: 11/03/2018 CLINICAL DATA:  73 year old female with cough EXAM: ABDOMEN - 2 VIEW COMPARISON:  CT abdomen 10/31/2018, CT chest 08/16/2017, plain film 11/02/2018 FINDINGS: Cardiomediastinal silhouette is unchanged with surgical changes of median sternotomy. Contour of ascending aortic aneurysm along the right mediastinum is unchanged. Gastric tube terminates within the stomach. Lower lungs are relatively clear. Gas within small bowel and colon, with borderline distention of small bowel loops. Degenerative changes of the spine. Dense calcifications of the abdominal aorta. Osteopenia IMPRESSION: Gastric tube terminates within the stomach. Borderline small bowel gaseous distention may indicate ileus/persisting small bowel  obstruction. Electronically Signed   By: Corrie Mckusick D.O.   On: 11/03/2018 11:38        Scheduled Meds: . calcium-vitamin D   Oral Daily  . cloNIDine  0.2 mg Transdermal Weekly  . heparin  5,000 Units Subcutaneous Q8H  . multivitamin with minerals  1 tablet Oral Daily  . senna-docusate  1 tablet Oral BID   Continuous Infusions: . dextrose 5 % and 0.45 % NaCl with KCl 40 mEq/L 100 mL/hr at 11/04/18 0829  . piperacillin-tazobactam (ZOSYN)  IV 2.25 g (11/04/18 0829)     LOS: 6 days    Time spent: Teller, MD [facility] Pager [phone]  If 7PM-7AM, please contact night-coverage [email or web address] Password [facility] 11/04/2018, 10:44 AM

## 2018-11-04 NOTE — Progress Notes (Signed)
  Palliative medicine progress note  Patient seen, chart reviewed.  Patient's husband and son are at the bedside.  Reviewed patient's clinical condition with family.  Unfortunately, despite antibiotics, NG tube for decompression, IV fluids, patient's bowel function is not returning.  Staffed with Dr. Cliffton Asters with surgical services.  Patient is not a surgical candidate secondary to her advanced dementia, bedbound status as well as dysphasia.  Risks of surgery would far outweigh benefits  Discussed options of watchful waiting for another 24 hours with antibiotics but did candidly share that at this point it does appear as though she is nearing ending end of life.  Patient at baseline is nonverbal, bedbound, has contractures and was only eating bites and sips as fed by her husband and staff at skilled nursing facility  Discussed residential hospice options as well.  After more discussion, family agreeable to [facility].  Did share with him that [facility] does not provide IV fluids, IV antibiotics but she can go with the NG tube in place for symptom management.  Patient seems to be tolerating this well and is not in restraints  Assessment/plan  No nonverbal signs of pain such as grimacing or moaning.  No tachypnea; she does have upper airway secretions forming.  IV fluids have been at 100 and hour, will KVO those Stop antibiotics Maintain NG tube for comfort to prevent vomiting Consult placed to [facility].  I have called [facility] representative directly as well as placing social work consult.  Offered choice of hospice service per Medicare guidelines and family has elected [facility] aka hosp[facility] and palliative [facility] [place].  Per liaison, they do have a bed available today Start scheduled Robinul for secretions Increased morphine oral dosing to 2.5 to 5 mg for shortness of breath and pain and added IV option of 1 to 2 mg every 2 hours as needed for shortness of breath or pain  Prognosis:  Less than 2 weeks in the setting of advanced dementia, small bowel obstruction, failure to thrive, inability to take anything by mouth; aspiration pneumonia  Disposition: Residential hospice  Thank you, Eduard Roux, NP Time spent: 35 minutes  Greater than 50% of time spent in counseling and coordination of care.  Staffed with patient's attending Dr. Jerral Ralph, as well as Dr. Cliffton Asters with surgical services

## 2018-11-04 NOTE — Plan of Care (Signed)
  Problem: Education: Goal: Knowledge of General Education information will improve Description: Including pain rating scale, medication(s)/side effects and non-pharmacologic comfort measures Outcome: Progressing   Problem: Health Behavior/Discharge Planning: Goal: Ability to manage health-related needs will improve Outcome: Progressing   Problem: Clinical Measurements: Goal: Ability to maintain clinical measurements within normal limits will improve Outcome: Progressing Goal: Will remain free from infection Outcome: Progressing Goal: Diagnostic test results will improve Outcome: Progressing Goal: Respiratory complications will improve Outcome: Progressing Goal: Cardiovascular complication will be avoided Outcome: Progressing   Problem: Activity: Goal: Risk for activity intolerance will decrease Outcome: Progressing   Problem: Elimination: Goal: Will not experience complications related to bowel motility Outcome: Progressing Goal: Will not experience complications related to urinary retention Outcome: Progressing   Problem: Safety: Goal: Ability to remain free from injury will improve Outcome: Progressing   Problem: Skin Integrity: Goal: Risk for impaired skin integrity will decrease Outcome: Progressing   

## 2018-11-04 NOTE — Progress Notes (Signed)
Nutrition Brief Note  Pt identified on the </= 12 Braden Score report. Admitted with fever and chest pain; found to have SBO. Pt currently has NGT in place for decompression.  Spoke with Dr. Jerral Ralph with [facility]. No plan to pursue nutrition support ([facility]N) at this time. Palliative Medicine following; likely transition to comfort care.  No nutrition interventions warranted at this time.  Please consult RD as needed.   Maureen Chatters, RD, LDN Pager #: [phone] After-Hours Pager #: [phone]

## 2018-11-04 NOTE — Progress Notes (Signed)
[facility])  Referral received from [facility], request for residential hospice at [facility].  Confirmed with LCSW and family.    Spoke with [name]., to acknowledge request and answer any questions he may have.  He is aware that [facility] will follow up once bed availability has been determined.  Thank you, Wallis Bamberg RN, BSN, CCRN [facility] Liaison [phone] (main #)

## 2018-11-04 NOTE — Discharge Summary (Signed)
Physician Discharge Summary  Amanda [name]:[id number] DOB: [birth year] DOA: 10/28/2018  PCP: Pecola Lawless, MD  Admit date: 10/28/2018 Discharge date: 11/04/2018  Admitted From: SNF Disposition:  Hospice Home   Recommendations for Outpatient Follow-up:  None   Home Health:NA  Equipment/Devices:NA   Discharge Condition:serious   CODE STATUS:DNR Diet recommendation:  comfort Brief/Interim Summary: Patient is a73 y.o.female with history of CVA, dementia, s/p aortic aneurysm repair-Per H&P-nonverbal at baseline-resident of SNF-presented to the ED for evaluation of fever, vomiting-initially thought to have MSSA bacteremia-however further culture results are positive for coag negative staph bacteremia. Subsequently on 3/23-started having projectile vomiting-CT abdomen showing small bowel obstruction,also thought to have aspiration pneumonia and resultant respiratory failure. Patient with very advanced debility. Going to hospice today.  Acute hypoxic respiratory failure: sepsis present on admission. Suspect secondary to aspiration pneumonia and abdominal distention.  Patient was treated with Zosyn. now changing to comfort care. Will keep on oxygen to keep patient comfortable.  Small bowel obstruction with possible internal hernia: Patient is treated with n.p.o., NG tube decompression.  Persistent small bowel obstruction.  No return of bowel function yet.  Followed by surgery.  Electrolytes are sufficient.   Hypertension: Unable to take oral medications.  Blood pressures are fairly stable.  On clonidine patch.    Stage II sacral ulcer: Present on admission.    Dementia/CVA with nonverbal and nonambulatory status and contractures:  Advanced dementia, debility, poor quality of life, unable to improve. After careful discussion with family, husband and son, decided to pursue comfort care measures and will be transferred to hospice house today.    Discharge Diagnoses:  Active  Problems:   Essential hypertension, benign   Dementia with behavioral disturbance (HCC)   Anemia of chronic disease   History of stroke   Dehydration with hypernatremia   Decubitus ulcer of back, stage 2 (HCC)   Alzheimer disease (HCC)   SIRS (systemic inflammatory response syndrome) (HCC)   MSSA bacteremia   SBO (small bowel obstruction) (HCC)   Goals of care, counseling/discussion   Palliative care by specialist    Discharge Instructions   Allergies as of 11/04/2018      Reactions   Pollen Extract Other (See Comments)   rhinitis      Medication List    STOP taking these medications   barrier cream Crea Commonly known as:  non-specified   Bisacodyl Laxative 10 MG suppository Generic drug:  bisacodyl   cloNIDine 0.1 MG tablet Commonly known as:  CATAPRES   [employer] OYSTER SHELL CALCIUM+VIT D PO   labetalol 300 MG tablet Commonly known as:  NORMODYNE   magnesium hydroxide 400 MG/5ML suspension Commonly known as:  MILK OF MAGNESIA   multivitamin with minerals tablet   NUTRITIONAL SUPPLEMENT PO   RA SALINE ENEMA RE   senna-docusate 8.6-50 MG tablet Commonly known as:  Senokot-S     TAKE these medications   cloNIDine 0.2 mg/24hr patch Commonly known as:  CATAPRES - Dosed in mg/24 hr Place 1 patch (0.2 mg total) onto the skin once a week. Start taking on:  Nov 14, 2018   glycopyrrolate 0.2 MG/ML injection Commonly known as:  ROBINUL Inject 1 mL (0.2 mg total) into the vein every 6 (six) hours.   morphine CONCENTRATE 10 MG/0.5ML Soln concentrated solution Place 0.13-0.25 mLs (2.6-5 mg total) into feeding tube every 2 (two) hours as needed for moderate pain, severe pain or shortness of breath (end of life care).  Allergies  Allergen Reactions  . Pollen Extract Other (See Comments)    rhinitis    Consultations:  General surgery  Palliative care    Procedures/Studies: Ct Abdomen Pelvis Wo Contrast  Result Date: 10/31/2018 CLINICAL DATA:   Nausea and vomiting EXAM: CT ABDOMEN AND PELVIS WITHOUT CONTRAST TECHNIQUE: Multidetector CT imaging of the abdomen and pelvis was performed following the standard protocol without IV contrast. COMPARISON:  08/16/2017 FINDINGS: Lower chest: Lung bases demonstrate patchy infiltrate throughout the right middle and lower lobe. Mild atelectatic changes are noted in the bases bilaterally. Hepatobiliary: Gallbladder is well distended. Some dependent density is noted which may be related to gallbladder sludge. No definitive calculi are seen. The liver is within normal limits. Pancreas: Unremarkable. No pancreatic ductal dilatation or surrounding inflammatory changes. Spleen: Normal in size without focal abnormality. Adrenals/Urinary Tract: Adrenal glands are within normal limits. Kidneys are well visualized bilaterally. No renal calculi or obstructive changes are seen. Scattered cysts are noted within the left kidney similar to that noted on prior exam. No obstructive changes are seen. The bladder is decompressed. Stomach/Bowel: The appendix is not well visualized. No inflammatory changes are seen. The colon is decompressed. Multiple dilated loops of small bowel are noted involving the mid to distal jejunum and proximal ileum. There is a transition zone identified in the right mid abdomen best seen on image number 46 of series 3. Additionally some swirling of the vasculature is seen suggestive of internal hernia. The stomach is within normal limits. 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 No significant lymphadenopathy is noted. Reproductive: Status post hysterectomy. No adnexal masses. Other: Minimal free fluid is noted within the pelvis likely reactive in nature. No free air is seen. Musculoskeletal: Degenerative changes of lumbar spine are noted. IMPRESSION: Findings consistent with  small bowel obstruction with a transition zone in the right mid abdomen as described. Some associated swirling of vasculature is noted suspicious for internal hernia. 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 Patchy right middle and right lower lobe acute infiltrate. Changes suggestive of gallbladder sludge. These results will be called to the ordering clinician or representative by the Radiologist Assistant, and communication documented in the PACS or zVision Dashboard. Electronically Signed   By: Alcide Clever M.D.   On: 10/31/2018 10:23   Dg Chest 1 View  Result Date: 11/03/2018 CLINICAL DATA:  Cough. EXAM: CHEST  1 VIEW COMPARISON:  Radiograph of November 02, 2018. FINDINGS: Stable cardiomediastinal silhouette is noted with focal bulge seen along right cardiac border consistent with history ascending thoracic aortic pseudoaneurysm. Atherosclerosis of thoracic aorta is noted. Nasogastric tube is in grossly good position. No pneumothorax or pleural effusion is noted. Lingular opacity noted on prior exam appears to have resolved. Left lung is clear currently. Mild right perihilar and basilar opacity is noted concerning for edema or possibly infiltrate. Bony thorax is unremarkable. IMPRESSION: Left lingular opacity noted on prior exam has resolved. Mild right perihilar and basilar opacity is noted concerning for edema or possibly infiltrate. Electronically Signed   By: Lupita Raider, M.D.   On: 11/03/2018 11:37   Dg Chest Port 1 View  Result Date: 10/28/2018 CLINICAL DATA:  Fever.  Altered mental status. EXAM: PORTABLE CHEST 1 VIEW COMPARISON:  11/09/2017. FINDINGS: Prior CABG. Cardiomegaly. No pulmonary venous congestion. No focal infiltrate. No pleural effusion or pneumothorax.  No acute bony abnormality. IMPRESSION: 1.  Prior CABG.  Cardiomegaly.  No pulmonary venous congestion. 2.  No focal infiltrate. Electronically Signed   By: Maisie Fus  Register   On:  10/28/2018 11:43   Dg Chest Port 1v Same Day  Result Date: 11/02/2018 CLINICAL DATA:  Shortness of breath. EXAM: PORTABLE CHEST 1 VIEW COMPARISON:  Radiograph of October 31, 2018. CT scan of August 16, 2017. FINDINGS: Stable cardiomediastinal silhouette is noted with stable right cardiac border bulge consistent with thoracic aortic pseudoaneurysm. Atherosclerosis of thoracic aorta is noted. Interval placement of nasogastric tube with tip in expected position of proximal stomach. No pneumothorax or pleural effusion is noted. Mild left lingular subsegmental atelectasis is noted. Mild right middle lobe subsegmental atelectasis or infiltrate is noted. Bony thorax is unremarkable. IMPRESSION: Mild left lingular subsegmental atelectasis. Mild right middle lobe subsegmental atelectasis or infiltrate is noted. Stable bulge involving right cardiac border consistent with history of ascending thoracic aortic pseudoaneurysm. Electronically Signed   By: Lupita Raider, M.D.   On: 11/02/2018 08:19   Dg Chest Port 1v Same Day  Result Date: 10/31/2018 CLINICAL DATA:  Shortness of breath.  Concern for aspiration. EXAM: PORTABLE CHEST 1 VIEW COMPARISON:  Chest x-ray from yesterday. FINDINGS: Stable cardiomediastinal silhouette with grossly unchanged ascending thoracic aorta pseudoaneurysm, accounting for the bulging contour along the right heart border. Normal pulmonary vascularity. No focal consolidation, pleural effusion, or pneumothorax. Linear atelectasis in the peripheral lingula. No acute osseous abnormality. IMPRESSION: 1. No active disease. 2. 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 Electronically Signed   By: Obie Dredge M.D.   On: 10/31/2018 08:49   Dg Chest Port 1v Same Day  Result Date: 10/30/2018 CLINICAL DATA:  Shortness of breath. EXAM: PORTABLE CHEST 1 VIEW COMPARISON:  Chest x-rays dated 10/28/2018, 11/09/2017 and 08/11/2017. FINDINGS: Stable cardiomegaly. Overall  cardiomediastinal silhouette appears stable in size and configuration. Atherosclerotic changes again noted at the aortic arch. Lungs are clear. No pleural effusion or pneumothorax seen. Median sternotomy wires appear intact and stable in alignment. Osseous structures about the chest are unremarkable. IMPRESSION: 1. No active disease. No evidence of pneumonia or pulmonary edema. 2. Stable cardiomegaly. 3. Aortic atherosclerosis. Electronically Signed   By: Bary Richard M.D.   On: 10/30/2018 15:07   Dg Abd 2 Views  Result Date: 11/03/2018 CLINICAL DATA:  73 year old female with cough EXAM: ABDOMEN - 2 VIEW COMPARISON:  CT abdomen 10/31/2018, CT chest 08/16/2017, plain film 11/02/2018 FINDINGS: Cardiomediastinal silhouette is unchanged with surgical changes of median sternotomy. Contour of ascending aortic aneurysm along the right mediastinum is unchanged. Gastric tube terminates within the stomach. Lower lungs are relatively clear. Gas within small bowel and colon, with borderline distention of small bowel loops. Degenerative changes of the spine. Dense calcifications of the abdominal aorta. Osteopenia IMPRESSION: Gastric tube terminates within the stomach. Borderline small bowel gaseous distention may indicate ileus/persisting small bowel obstruction. Electronically Signed   By: Gilmer Mor D.O.   On: 11/03/2018 11:38   Dg Abd Portable 1v  Result Date: 11/01/2018 CLINICAL DATA:  Follow-up small bowel obstruction. EXAM: PORTABLE ABDOMEN - 1 VIEW COMPARISON:  10/31/2018 FINDINGS: There are dilated loops of small bowel that are without significant change from the previous day's exam. Some residual contrast is noted in the stomach. A nasogastric tube tip projects in the left upper quadrant, well within the stomach, also unchanged. No new abnormalities. IMPRESSION: 1. Persistent small bowel obstruction, with no significant change from the previous day's study.  Electronically Signed   By: Amie Portland M.D.    On: 11/01/2018 08:33   Dg Abd Portable 1v-small Bowel Obstruction Protocol-initial, 8 Hr Delay  Result Date: 10/31/2018 CLINICAL DATA:  Bowel obstruction EXAM: PORTABLE ABDOMEN - 1 VIEW COMPARISON:  October 31, 2018 study obtained earlier in the day FINDINGS: Nasogastric tube tip and side port in stomach. There remain multiple loops of dilated small bowel in a pattern suggesting bowel obstruction. No free air. There is patchy infiltrate in the right lower lobe. Left base is clear. IMPRESSION: Nasogastric tube tip and side port in stomach. Bowel gas pattern suggest persistent bowel obstruction. No evident free air. Infiltrate right lung base. Electronically Signed   By: Bretta Bang III M.D.   On: 10/31/2018 21:49   Dg Abd Portable 1v-small Bowel Protocol-position Verification  Result Date: 10/31/2018 CLINICAL DATA:  NG tube placement. EXAM: PORTABLE ABDOMEN - 1 VIEW COMPARISON:  CT abdomen pelvis from same day. FINDINGS: Enteric tube tip in the stomach with the proximal side port in the lower esophagus. Multiple dilated loops of small bowel again noted. IMPRESSION: 1. Enteric tube tip in the stomach with the proximal side port in the lower esophagus. Recommend advancement by at least 3 cm. 2. Unchanged small bowel obstruction. Electronically Signed   By: Obie Dredge M.D.   On: 10/31/2018 11:56      Subjective: Patient was seen and examined.  No overnight events.  She remains lethargic.  Today she is opening her eyes, however not interactive.  Husband says that she is more or less like this. NG output 2700, no bowel movements. Family at bedside.   Discharge Exam: Vitals:   11/03/18 2130 11/04/18 0434  BP: (!) 148/103 (!) 132/95  Pulse: 98 77  Resp: 15 15  Temp: 98.2 F (36.8 C) 98.7 F (37.1 C)  SpO2: 96% 100%   Vitals:   11/03/18 1247 11/03/18 1805 11/03/18 2130 11/04/18 0434  BP: 133/74 (!) 148/99 (!) 148/103 (!) 132/95  Pulse: 89 73 98 77  Resp:  (!) 24 15 15   Temp: 97.9 F  (36.6 C) 98.2 F (36.8 C) 98.2 F (36.8 C) 98.7 F (37.1 C)  TempSrc: Axillary Oral Oral Oral  SpO2: 97% 100% 96% 100%  Weight:      Height:       General exam: Appears calm and somewhat anxious .  Patient is nonverbal and no interaction.  She looks comfortable on 4 L oxygen. Respiratory system: Clear to auscultation. Respiratory effort normal.  No additional sounds. Cardiovascular system: S1 & S2 heard, RRR. No JVD, murmurs, rubs, gallops or clicks. No pedal edema. Gastrointestinal system: Abdomen is nondistended, soft and nontender. No organomegaly or masses felt.  No bowel sounds.  No rigidity or guarding.  Patient winces on deep palpation of the abdomen. Central nervous system: Patient is sleepy lethargic and noninteractive. Extremities: Contracted.  Difficult to examine motor power.  Does not follow commands. Skin: No rashes, reportedly stage II on sacrum. Psychiatry: Sleepy lethargic and flat affect.     The results of significant diagnostics from this hospitalization (including imaging, microbiology, ancillary and laboratory) are listed below for reference.     Microbiology: Recent Results (from the past 240 hour(s))  Blood Culture (routine x 2)     Status: None   Collection Time: 10/28/18 10:08 AM  Result Value Ref Range Status   Specimen Description BLOOD LEFT HAND  Final   Special Requests   Final    BOTTLES DRAWN AEROBIC  ONLY Blood Culture results may not be optimal due to an inadequate volume of blood received in culture bottles   Culture   Final    NO GROWTH 5 DAYS Performed at [facility], [address] [place], [place] [address]    Report Status 11/02/2018 FINAL  Final  Blood Culture (routine x 2)     Status: Abnormal   Collection Time: 10/28/18 10:20 AM  Result Value Ref Range Status   Specimen Description BLOOD LEFT HAND  Final   Special Requests   Final    BOTTLES DRAWN AEROBIC AND ANAEROBIC Blood Culture adequate volume   Culture  Setup Time    Final    GRAM POSITIVE COCCI ANAEROBIC BOTTLE ONLY CRITICAL RESULT CALLED TO, READ BACK BY AND VERIFIED WITH: PHARMD ANNA LOVE [phone] FCP    Culture (A)  Final    STAPHYLOCOCCUS SPECIES (COAGULASE NEGATIVE) THE SIGNIFICANCE OF ISOLATING THIS ORGANISM FROM A SINGLE SET OF BLOOD CULTURES WHEN MULTIPLE SETS ARE DRAWN IS UNCERTAIN. PLEASE NOTIFY THE MICROBIOLOGY DEPARTMENT WITHIN ONE WEEK IF SPECIATION AND SENSITIVITIES ARE REQUIRED. Performed at [facility], [address] [place], [place] [address]    Report Status 10/31/2018 FINAL  Final  Blood Culture ID Panel (Reflexed)     Status: Abnormal   Collection Time: 10/28/18 10:20 AM  Result Value Ref Range Status   Enterococcus species NOT DETECTED NOT DETECTED Final   Listeria monocytogenes NOT DETECTED NOT DETECTED Final   Staphylococcus species DETECTED (A) NOT DETECTED Final    Comment: Methicillin (oxacillin) susceptible coagulase negative staphylococcus. Possible blood culture contaminant (unless isolated from more than one blood culture draw or clinical case suggests pathogenicity). No antibiotic treatment is indicated for blood  culture contaminants. CRITICAL RESULT CALLED TO, READ BACK BY AND VERIFIED WITH: PHARMD ANNA LOVE [phone] FCP    Staphylococcus aureus (BCID) NOT DETECTED NOT DETECTED Final   Methicillin resistance NOT DETECTED NOT DETECTED Final   Streptococcus species NOT DETECTED NOT DETECTED Final   Streptococcus agalactiae NOT DETECTED NOT DETECTED Final   Streptococcus pneumoniae NOT DETECTED NOT DETECTED Final   Streptococcus pyogenes NOT DETECTED NOT DETECTED Final   Acinetobacter baumannii NOT DETECTED NOT DETECTED Final   Enterobacteriaceae species NOT DETECTED NOT DETECTED Final   Enterobacter cloacae complex NOT DETECTED NOT DETECTED Final   Escherichia coli NOT DETECTED NOT DETECTED Final   Klebsiella oxytoca NOT DETECTED NOT DETECTED Final   Klebsiella pneumoniae NOT DETECTED NOT DETECTED  Final   Proteus species NOT DETECTED NOT DETECTED Final   Serratia marcescens NOT DETECTED NOT DETECTED Final   Haemophilus influenzae NOT DETECTED NOT DETECTED Final   Neisseria meningitidis NOT DETECTED NOT DETECTED Final   Pseudomonas aeruginosa NOT DETECTED NOT DETECTED Final   Candida albicans NOT DETECTED NOT DETECTED Final   Candida glabrata NOT DETECTED NOT DETECTED Final   Candida krusei NOT DETECTED NOT DETECTED Final   Candida parapsilosis NOT DETECTED NOT DETECTED Final   Candida tropicalis NOT DETECTED NOT DETECTED Final    Comment: Performed at [facility], [address], [place], [place] [address]  Urine culture     Status: Abnormal   Collection Time: 10/28/18 12:15 PM  Result Value Ref Range Status   Specimen Description URINE, CLEAN CATCH  Final   Special Requests NONE  Final   Culture (A)  Final    <10,000 COLONIES/mL INSIGNIFICANT GROWTH Performed at [facility], [address] [place], [place]  [address]    Report Status 10/29/2018 FINAL  Final  MRSA PCR Screening     Status: None   Collection Time: 10/28/18  5:58 PM  Result Value Ref Range Status   MRSA by PCR NEGATIVE NEGATIVE Final    Comment:        The GeneXpert MRSA Assay (FDA approved for NASAL specimens only), is one component of a comprehensive MRSA colonization surveillance program. It is not intended to diagnose MRSA infection nor to guide or monitor treatment for MRSA infections. Performed at [facility], [address], [place], [place] [address]   Culture, blood (routine x 2)     Status: None   Collection Time: 10/29/18 12:39 PM  Result Value Ref Range Status   Specimen Description BLOOD LEFT WRIST  Final   Special Requests   Final    BOTTLES DRAWN AEROBIC ONLY Blood Culture adequate volume   Culture   Final    NO GROWTH 5 DAYS Performed at [facility], [address], [place], [place] [address]    Report Status 11/03/2018 FINAL  Final  Culture, blood  (routine x 2)     Status: None   Collection Time: 10/29/18 12:39 PM  Result Value Ref Range Status   Specimen Description BLOOD LEFT HAND  Final   Special Requests   Final    BOTTLES DRAWN AEROBIC ONLY Blood Culture adequate volume   Culture   Final    NO GROWTH 5 DAYS Performed at [facility], [address] [place], [place] [address]    Report Status 11/03/2018 FINAL  Final     Labs: BNP (last 3 results) No results for input(s): BNP in the last 8760 hours. Basic Metabolic Panel: Recent Labs  Lab 10/31/18 0729 11/01/18 0724 11/02/18 0354 11/03/18 0409 11/04/18 0236  NA 141 140 138 140 146*  K 3.6 3.0* 3.2* 4.8 4.6  CL 110 104 103 103 106  CO2 20* 20* 20* 24 27  GLUCOSE 125* 104* 107* 171* 129*  BUN 19 26* 24* 31* 31*  CREATININE 1.31* 1.71* 1.85* 2.30* 2.71*  CALCIUM 8.7* 8.9 9.0 9.4 9.1  MG  --   --   --  2.0  --    Liver Function Tests: No results for input(s): AST, ALT, ALKPHOS, BILITOT, PROT, ALBUMIN in the last 168 hours. Recent Labs  Lab 10/31/18 0846  LIPASE 42   No results for input(s): AMMONIA in the last 168 hours. CBC: Recent Labs  Lab 10/31/18 0846 11/01/18 0724 11/02/18 0354 11/03/18 0409 11/04/18 0236  WBC 11.9* 13.9* 14.4* 16.6* 16.0*  NEUTROABS  --   --   --   --  12.2*  HGB 12.8 11.8* 11.6* 13.5 10.0*  HCT 39.1 36.5 35.5* 40.6 32.0*  MCV 91.8 91.5 90.8 92.3 94.1  PLT 201 233 278 292 308   Cardiac Enzymes: No results for input(s): CKTOTAL, CKMB, CKMBINDEX, TROPONINI in the last 168 hours. BNP: Invalid input(s): POCBNP CBG: No results for input(s): GLUCAP in the last 168 hours. D-Dimer No results for input(s): DDIMER in the last 72 hours. Hgb A1c No results for input(s): HGBA1C in the last 72 hours. Lipid Profile No results for input(s): CHOL, HDL, LDLCALC, TRIG, CHOLHDL, LDLDIRECT in the last 72 hours. Thyroid function studies No results for input(s): TSH, T4TOTAL, T3FREE, THYROIDAB in the last 72 hours.  Invalid  input(s): FREET3 Anemia work up No results for input(s): VITAMINB12, FOLATE, FERRITIN, TIBC, IRON, RETICCTPCT in the  last 72 hours. Urinalysis    Component Value Date/Time   COLORURINE AMBER (A) 10/28/2018 1215   APPEARANCEUR CLOUDY (A) 10/28/2018 1215   LABSPEC 1.029 10/28/2018 1215   PHURINE 5.0 10/28/2018 1215   GLUCOSEU NEGATIVE 10/28/2018 1215   HGBUR NEGATIVE 10/28/2018 1215   BILIRUBINUR NEGATIVE 10/28/2018 1215   KETONESUR 20 (A) 10/28/2018 1215   PROTEINUR 30 (A) 10/28/2018 1215   UROBILINOGEN 0.2 05/17/2012 1424   NITRITE NEGATIVE 10/28/2018 1215   LEUKOCYTESUR NEGATIVE 10/28/2018 1215   Sepsis Labs Invalid input(s): PROCALCITONIN,  WBC,  LACTICIDVEN Microbiology Recent Results (from the past 240 hour(s))  Blood Culture (routine x 2)     Status: None   Collection Time: 10/28/18 10:08 AM  Result Value Ref Range Status   Specimen Description BLOOD LEFT HAND  Final   Special Requests   Final    BOTTLES DRAWN AEROBIC ONLY Blood Culture results may not be optimal due to an inadequate volume of blood received in culture bottles   Culture   Final    NO GROWTH 5 DAYS Performed at [facility], [address] [place], [place] [address]    Report Status 11/02/2018 FINAL  Final  Blood Culture (routine x 2)     Status: Abnormal   Collection Time: 10/28/18 10:20 AM  Result Value Ref Range Status   Specimen Description BLOOD LEFT HAND  Final   Special Requests   Final    BOTTLES DRAWN AEROBIC AND ANAEROBIC Blood Culture adequate volume   Culture  Setup Time   Final    GRAM POSITIVE COCCI ANAEROBIC BOTTLE ONLY CRITICAL RESULT CALLED TO, READ BACK BY AND VERIFIED WITH: PHARMD ANNA LOVE [phone] FCP    Culture (A)  Final    STAPHYLOCOCCUS SPECIES (COAGULASE NEGATIVE) THE SIGNIFICANCE OF ISOLATING THIS ORGANISM FROM A SINGLE SET OF BLOOD CULTURES WHEN MULTIPLE SETS ARE DRAWN IS UNCERTAIN. PLEASE NOTIFY THE MICROBIOLOGY DEPARTMENT WITHIN ONE WEEK IF SPECIATION AND  SENSITIVITIES ARE REQUIRED. Performed at [facility], [address] [place], [place] [address]    Report Status 10/31/2018 FINAL  Final  Blood Culture ID Panel (Reflexed)     Status: Abnormal   Collection Time: 10/28/18 10:20 AM  Result Value Ref Range Status   Enterococcus species NOT DETECTED NOT DETECTED Final   Listeria monocytogenes NOT DETECTED NOT DETECTED Final   Staphylococcus species DETECTED (A) NOT DETECTED Final    Comment: Methicillin (oxacillin) susceptible coagulase negative staphylococcus. Possible blood culture contaminant (unless isolated from more than one blood culture draw or clinical case suggests pathogenicity). No antibiotic treatment is indicated for blood  culture contaminants. CRITICAL RESULT CALLED TO, READ BACK BY AND VERIFIED WITH: PHARMD ANNA LOVE [id number] FCP    Staphylococcus aureus (BCID) NOT DETECTED NOT DETECTED Final   Methicillin resistance NOT DETECTED NOT DETECTED Final   Streptococcus species NOT DETECTED NOT DETECTED Final   Streptococcus agalactiae NOT DETECTED NOT DETECTED Final   Streptococcus pneumoniae NOT DETECTED NOT DETECTED Final   Streptococcus pyogenes NOT DETECTED NOT DETECTED Final   Acinetobacter baumannii NOT DETECTED NOT DETECTED Final   Enterobacteriaceae species NOT DETECTED NOT DETECTED Final   Enterobacter cloacae complex NOT DETECTED NOT DETECTED Final   Escherichia coli NOT DETECTED NOT DETECTED Final   Klebsiella oxytoca NOT DETECTED NOT DETECTED Final   Klebsiella pneumoniae NOT DETECTED NOT DETECTED Final   Proteus species NOT DETECTED NOT DETECTED Final   Serratia marcescens NOT DETECTED NOT DETECTED Final  Haemophilus influenzae NOT DETECTED NOT DETECTED Final   Neisseria meningitidis NOT DETECTED NOT DETECTED Final   Pseudomonas aeruginosa NOT DETECTED NOT DETECTED Final   Candida albicans NOT DETECTED NOT DETECTED Final   Candida glabrata NOT DETECTED NOT DETECTED Final   Candida krusei NOT DETECTED  NOT DETECTED Final   Candida parapsilosis NOT DETECTED NOT DETECTED Final   Candida tropicalis NOT DETECTED NOT DETECTED Final    Comment: Performed at [facility], [address], [place], [place] [address]  Urine culture     Status: Abnormal   Collection Time: 10/28/18 12:15 PM  Result Value Ref Range Status   Specimen Description URINE, CLEAN CATCH  Final   Special Requests NONE  Final   Culture (A)  Final    <10,000 COLONIES/mL INSIGNIFICANT GROWTH Performed at [facility], [address], [place], [place] [address]    Report Status 10/29/2018 FINAL  Final  MRSA PCR Screening     Status: None   Collection Time: 10/28/18  5:58 PM  Result Value Ref Range Status   MRSA by PCR NEGATIVE NEGATIVE Final    Comment:        The GeneXpert MRSA Assay (FDA approved for NASAL specimens only), is one component of a comprehensive MRSA colonization surveillance program. It is not intended to diagnose MRSA infection nor to guide or monitor treatment for MRSA infections. Performed at [facility], [address] [place], [place] [address]   Culture, blood (routine x 2)     Status: None   Collection Time: 10/29/18 12:39 PM  Result Value Ref Range Status   Specimen Description BLOOD LEFT WRIST  Final   Special Requests   Final    BOTTLES DRAWN AEROBIC ONLY Blood Culture adequate volume   Culture   Final    NO GROWTH 5 DAYS Performed at [facility], [address] [place], [place] [address]    Report Status 11/03/2018 FINAL  Final  Culture, blood (routine x 2)     Status: None   Collection Time: 10/29/18 12:39 PM  Result Value Ref Range Status   Specimen Description BLOOD LEFT HAND  Final   Special Requests   Final    BOTTLES DRAWN AEROBIC ONLY Blood Culture adequate volume   Culture   Final    NO GROWTH 5 DAYS Performed at [facility], [address] [place], [place] [address]    Report Status 11/03/2018 FINAL  Final     Time coordinating  discharge: 35 minutes  SIGNED:   Dorcas Carrow, MD  [facility] 11/04/2018, 1:30 PM Pager [phone]  If 7PM-7AM, please contact night-coverage [email or web address] Password [email or web address]

## 2018-11-05 NOTE — Progress Notes (Signed)
PROGRESS NOTE    Amanda [name]  GYF:[id number] DOB: 08/23/1945 DOA: 10/28/2018 PCP: Pecola Lawless, MD    Brief Narrative:   Patient is a73 y.o.female with history of CVA, dementia, s/p aortic aneurysm repair-Per H&P-nonverbal at baseline-resident of SNF-presented to the ED for evaluation of fever, vomiting-initially thought to have MSSA bacteremia-however further culture results are positive for coag negative staph bacteremia. Subsequently on 3/23-started having projectile vomiting-CT abdomen showing small bowel obstruction,also thought to have aspiration pneumonia and resultant respiratory failure. Patient with very advanced debility.   Patient is currently on comfort care.  Waiting to go to inpatient hospice.  Assessment & Plan:   Active Problems:   Essential hypertension, benign   Dementia with behavioral disturbance (HCC)   Anemia of chronic disease   History of stroke   Dehydration with hypernatremia   Decubitus ulcer of back, stage 2 (HCC)   Alzheimer disease (HCC)   SIRS (systemic inflammatory response syndrome) (HCC)   MSSA bacteremia   SBO (small bowel obstruction) (HCC)   Goals of care, counseling/discussion   Palliative care by specialist  Acute hypoxic respiratory failure/suspected aspiration pneumonia/sepsis present on admission/small bowel obstruction unable to improve, advanced debility: After careful discussion patient is currently on comfort level of care. She has all symptom control medications available. Continue current level of care. Waiting for hospice bed availability.   DVT prophylaxis: Comfort care Code Status: Comfort care Family Communication: No family at bedside. Disposition Plan: Inpatient hospice   Consultants:   Surgery, signed off  Palliative  Procedures:   None  Antimicrobials:   None   Subjective: Patient seen and examined.  She is obtunded.  Had received 2 mg of morphine in the middle of the night for  agitation.  No other overnight events. Waiting for bed availability.  Objective: Vitals:   11/04/18 2326 11/05/18 0013 11/05/18 0440 11/05/18 0629  BP: 140/87  (!) 140/95   Pulse: (!) 149  (!) 112   Resp: (!) 38 (!) 34 18   Temp:   99.4 F (37.4 C) (!) 100.5 F (38.1 C)  TempSrc:   Oral Axillary  SpO2: 98% 92% 98%   Weight:      Height:        Intake/Output Summary (Last 24 hours) at 11/05/2018 0956 Last data filed at 11/04/2018 1400 Gross per 24 hour  Intake 554 ml  Output 450 ml  Net 104 ml   Filed Weights   10/28/18 0959 10/28/18 1530  Weight: 50.9 kg 45.5 kg    Examination:  General exam: Obtunded. Respiratory system: Conducted airway sounds. Cardiovascular system: S1 & S2 heard, RRR. No JVD, murmurs, rubs, gallops or clicks. No pedal edema.  Tachycardic.   Data Reviewed: I have personally reviewed following labs and imaging studies  CBC: Recent Labs  Lab 10/31/18 0846 11/01/18 0724 11/02/18 0354 11/03/18 0409 11/04/18 0236  WBC 11.9* 13.9* 14.4* 16.6* 16.0*  NEUTROABS  --   --   --   --  12.2*  HGB 12.8 11.8* 11.6* 13.5 10.0*  HCT 39.1 36.5 35.5* 40.6 32.0*  MCV 91.8 91.5 90.8 92.3 94.1  PLT 201 233 278 292 308   Basic Metabolic Panel: Recent Labs  Lab 10/31/18 0729 11/01/18 0724 11/02/18 0354 11/03/18 0409 11/04/18 0236  NA 141 140 138 140 146*  K 3.6 3.0* 3.2* 4.8 4.6  CL 110 104 103 103 106  CO2 20* 20* 20* 24 27  GLUCOSE 125* 104* 107* 171* 129*  BUN 19 26*  24* 31* 31*  CREATININE 1.31* 1.71* 1.85* 2.30* 2.71*  CALCIUM 8.7* 8.9 9.0 9.4 9.1  MG  --   --   --  2.0  --    GFR: Estimated Creatinine Clearance: 12.9 mL/min (A) (by C-G formula based on SCr of 2.71 mg/dL (H)). Liver Function Tests: No results for input(s): AST, ALT, ALKPHOS, BILITOT, PROT, ALBUMIN in the last 168 hours. Recent Labs  Lab 10/31/18 0846  LIPASE 42   No results for input(s): AMMONIA in the last 168 hours. Coagulation Profile: No results for input(s): INR,  PROTIME in the last 168 hours. Cardiac Enzymes: No results for input(s): CKTOTAL, CKMB, CKMBINDEX, TROPONINI in the last 168 hours. BNP (last 3 results) No results for input(s): PROBNP in the last 8760 hours. HbA1C: No results for input(s): HGBA1C in the last 72 hours. CBG: No results for input(s): GLUCAP in the last 168 hours. Lipid Profile: No results for input(s): CHOL, HDL, LDLCALC, TRIG, CHOLHDL, LDLDIRECT in the last 72 hours. Thyroid Function Tests: No results for input(s): TSH, T4TOTAL, FREET4, T3FREE, THYROIDAB in the last 72 hours. Anemia Panel: No results for input(s): VITAMINB12, FOLATE, FERRITIN, TIBC, IRON, RETICCTPCT in the last 72 hours. Sepsis Labs: No results for input(s): PROCALCITON, LATICACIDVEN in the last 168 hours.  Recent Results (from the past 240 hour(s))  Blood Culture (routine x 2)     Status: None   Collection Time: 10/28/18 10:08 AM  Result Value Ref Range Status   Specimen Description BLOOD LEFT HAND  Final   Special Requests   Final    BOTTLES DRAWN AEROBIC ONLY Blood Culture results may not be optimal due to an inadequate volume of blood received in culture bottles   Culture   Final    NO GROWTH 5 DAYS Performed at [facility], [address] [place], [place] [address]    Report Status 11/02/2018 FINAL  Final  Blood Culture (routine x 2)     Status: Abnormal   Collection Time: 10/28/18 10:20 AM  Result Value Ref Range Status   Specimen Description BLOOD LEFT HAND  Final   Special Requests   Final    BOTTLES DRAWN AEROBIC AND ANAEROBIC Blood Culture adequate volume   Culture  Setup Time   Final    GRAM POSITIVE COCCI ANAEROBIC BOTTLE ONLY CRITICAL RESULT CALLED TO, READ BACK BY AND VERIFIED WITH: PHARMD ANNA LOVE [phone] [id number] FCP    Culture (A)  Final    STAPHYLOCOCCUS SPECIES (COAGULASE NEGATIVE) THE SIGNIFICANCE OF ISOLATING THIS ORGANISM FROM A SINGLE SET OF BLOOD CULTURES WHEN MULTIPLE SETS ARE DRAWN IS UNCERTAIN. PLEASE NOTIFY  THE MICROBIOLOGY DEPARTMENT WITHIN ONE WEEK IF SPECIATION AND SENSITIVITIES ARE REQUIRED. Performed at [facility], [address] [place], [place] [address]    Report Status 10/31/2018 FINAL  Final  Blood Culture ID Panel (Reflexed)     Status: Abnormal   Collection Time: 10/28/18 10:20 AM  Result Value Ref Range Status   Enterococcus species NOT DETECTED NOT DETECTED Final   Listeria monocytogenes NOT DETECTED NOT DETECTED Final   Staphylococcus species DETECTED (A) NOT DETECTED Final    Comment: Methicillin (oxacillin) susceptible coagulase negative staphylococcus. Possible blood culture contaminant (unless isolated from more than one blood culture draw or clinical case suggests pathogenicity). No antibiotic treatment is indicated for blood  culture contaminants. CRITICAL RESULT CALLED TO, READ BACK BY AND VERIFIED WITH: PHARMD ANNA LOVE [id number] FCP    Staphylococcus aureus (BCID) NOT DETECTED  NOT DETECTED Final   Methicillin resistance NOT DETECTED NOT DETECTED Final   Streptococcus species NOT DETECTED NOT DETECTED Final   Streptococcus agalactiae NOT DETECTED NOT DETECTED Final   Streptococcus pneumoniae NOT DETECTED NOT DETECTED Final   Streptococcus pyogenes NOT DETECTED NOT DETECTED Final   Acinetobacter baumannii NOT DETECTED NOT DETECTED Final   Enterobacteriaceae species NOT DETECTED NOT DETECTED Final   Enterobacter cloacae complex NOT DETECTED NOT DETECTED Final   Escherichia coli NOT DETECTED NOT DETECTED Final   Klebsiella oxytoca NOT DETECTED NOT DETECTED Final   Klebsiella pneumoniae NOT DETECTED NOT DETECTED Final   Proteus species NOT DETECTED NOT DETECTED Final   Serratia marcescens NOT DETECTED NOT DETECTED Final   Haemophilus influenzae NOT DETECTED NOT DETECTED Final   Neisseria meningitidis NOT DETECTED NOT DETECTED Final   Pseudomonas aeruginosa NOT DETECTED NOT DETECTED Final   Candida albicans NOT DETECTED NOT DETECTED Final   Candida glabrata  NOT DETECTED NOT DETECTED Final   Candida krusei NOT DETECTED NOT DETECTED Final   Candida parapsilosis NOT DETECTED NOT DETECTED Final   Candida tropicalis NOT DETECTED NOT DETECTED Final    Comment: Performed at [facility], [address] [place], [place] [address]  Urine culture     Status: Abnormal   Collection Time: 10/28/18 12:15 PM  Result Value Ref Range Status   Specimen Description URINE, CLEAN CATCH  Final   Special Requests NONE  Final   Culture (A)  Final    <10,000 COLONIES/mL INSIGNIFICANT GROWTH Performed at [facility], [address] [place], [place] [address]    Report Status 10/29/2018 FINAL  Final  MRSA PCR Screening     Status: None   Collection Time: 10/28/18  5:58 PM  Result Value Ref Range Status   MRSA by PCR NEGATIVE NEGATIVE Final    Comment:        The GeneXpert MRSA Assay (FDA approved for NASAL specimens only), is one component of a comprehensive MRSA colonization surveillance program. It is not intended to diagnose MRSA infection nor to guide or monitor treatment for MRSA infections. Performed at [facility], [address], [place], [place] [address]   Culture, blood (routine x 2)     Status: None   Collection Time: 10/29/18 12:39 PM  Result Value Ref Range Status   Specimen Description BLOOD LEFT WRIST  Final   Special Requests   Final    BOTTLES DRAWN AEROBIC ONLY Blood Culture adequate volume   Culture   Final    NO GROWTH 5 DAYS Performed at [facility], [address], [place], [place] [address]    Report Status 11/03/2018 FINAL  Final  Culture, blood (routine x 2)     Status: None   Collection Time: 10/29/18 12:39 PM  Result Value Ref Range Status   Specimen Description BLOOD LEFT HAND  Final   Special Requests   Final    BOTTLES DRAWN AEROBIC ONLY Blood Culture adequate volume   Culture   Final    NO GROWTH 5 DAYS Performed at [facility], [address] [place], [place] [address]     Report Status 11/03/2018 FINAL  Final         Radiology Studies: Dg Chest 1 View  Result Date: 11/03/2018 CLINICAL DATA:  Cough. EXAM: CHEST  1 VIEW COMPARISON:  Radiograph of November 02, 2018. FINDINGS: Stable cardiomediastinal silhouette is noted with focal bulge seen along right cardiac  border consistent with history ascending thoracic aortic pseudoaneurysm. Atherosclerosis of thoracic aorta is noted. Nasogastric tube is in grossly good position. No pneumothorax or pleural effusion is noted. Lingular opacity noted on prior exam appears to have resolved. Left lung is clear currently. Mild right perihilar and basilar opacity is noted concerning for edema or possibly infiltrate. Bony thorax is unremarkable. IMPRESSION: Left lingular opacity noted on prior exam has resolved. Mild right perihilar and basilar opacity is noted concerning for edema or possibly infiltrate. Electronically Signed   By: Lupita Raider, M.D.   On: 11/03/2018 11:37   Dg Abd 2 Views  Result Date: 11/03/2018 CLINICAL DATA:  73 year old female with cough EXAM: ABDOMEN - 2 VIEW COMPARISON:  CT abdomen 10/31/2018, CT chest 08/16/2017, plain film 11/02/2018 FINDINGS: Cardiomediastinal silhouette is unchanged with surgical changes of median sternotomy. Contour of ascending aortic aneurysm along the right mediastinum is unchanged. Gastric tube terminates within the stomach. Lower lungs are relatively clear. Gas within small bowel and colon, with borderline distention of small bowel loops. Degenerative changes of the spine. Dense calcifications of the abdominal aorta. Osteopenia IMPRESSION: Gastric tube terminates within the stomach. Borderline small bowel gaseous distention may indicate ileus/persisting small bowel obstruction. Electronically Signed   By: Gilmer Mor D.O.   On: 11/03/2018 11:38        Scheduled Meds: . cloNIDine  0.2 mg Transdermal Weekly  . glycopyrrolate  0.2 mg Intravenous Q6H   Continuous Infusions: .  dextrose 5 % and 0.45 % NaCl with KCl 40 mEq/L 10 mL/hr at 11/04/18 1400     LOS: 7 days    Time spent: 15 minutes.    Dorcas Carrow, MD [facility] Pager [phone]  If 7PM-7AM, please contact night-coverage [email or web address] Password [facility] 11/05/2018, 9:56 AM

## 2018-11-05 NOTE — Progress Notes (Signed)
Manufacturing engineer [facility]) Hospital Liaison note.  Received request from [place] for family interest in [facility] with request for transfertoday. Chart reviewed and eligibility confirmed. Met with  family to confirm interest and explain services. Family agreeable to transfer today. CSW aware. Registration paper work completed.  Dr. Orpah Melter  to assume care per family request. Please fax discharge summary to [phone]. RN please call report to [phone]. Please arrange transport for patient.  Thank you, ? Farrel Gordon, RN, [facility] Liaison [phone]? [place] are on AMION?

## 2018-11-05 NOTE — Progress Notes (Signed)
Amanda [name] to be D/C'd to [facility] per MD order. Report called to [place]. VVS, Skin clean, dry and intact without evidence of skin break down, no evidence of skin tears noted.  IV catheter discontinued intact per nurse at [facility] place. Site without signs and symptoms of complications. Dressing and pressure applied. NG tube removed. An After Visit Summary was printed and given to [facility].  Patient escorted via stretcher, and D/C to [facility] via [place].  Jon Gills  11/05/2018 12:57 PM

## 2018-11-05 NOTE — Progress Notes (Signed)
Clinical Social Worker facilitated patient discharge including contacting patient family and facility to confirm patient discharge plans. Clinical information faxed to facility and family agreeable with plan.  CSW arranged ambulance transport via [facility] to [facility] . RN to call for report prior to discharge.  Clinical Social Worker will sign off for now as social work intervention is no longer needed. Please consult [place] again if new need arises.  [address] [place], [place] [phone]

## 2018-11-09 DEATH — deceased

## 2018-11-13 IMAGING — CT CT CHEST W/ CM
2 of 5 series · 11 of 36 positions shown, 13 images · IV contrast (Omni 300)
Comparison: CT report from 08/25/2001

CLINICAL DATA: 71-year-old with dementia and admitted with sepsis
and fever of unknown origin. CT report from 1990 described a type A
aortic dissection.

EXAM:
CT CHEST, ABDOMEN, AND PELVIS WITH CONTRAST
TECHNIQUE: Multidetector CT imaging of the chest, abdomen and pelvis was
performed following the standard protocol during bolus
administration of intravenous contrast.
CONTRAST:  75mL [id number] IOPAMIDOL ([id number]) INJECTION 61%

[Series 3: cap with 5mm st · axial · 0.60mm/px · z∈[+846,+1311]mm · 8 of 115 slices shown, 10 images]
[im 11/115  mediastinal]
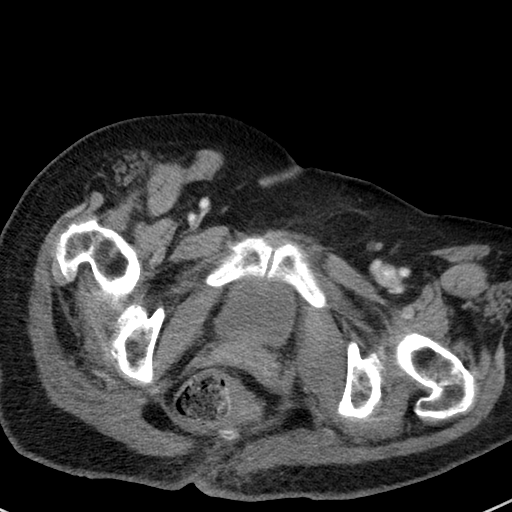
[im 11/115  lung]
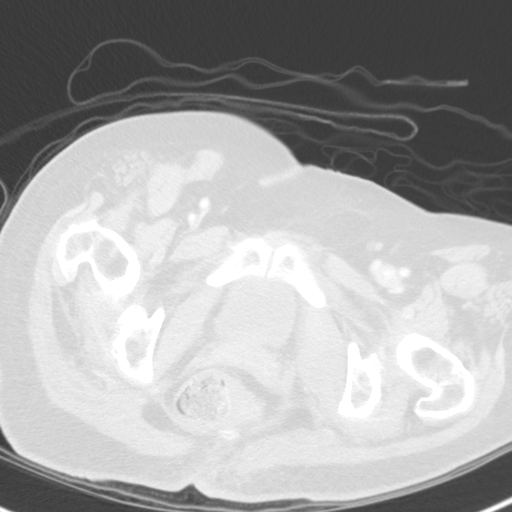
[im 21/115  lung]
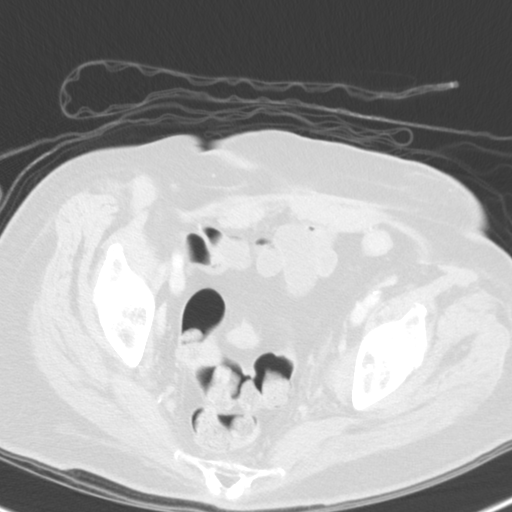
[im 42/115  lung]
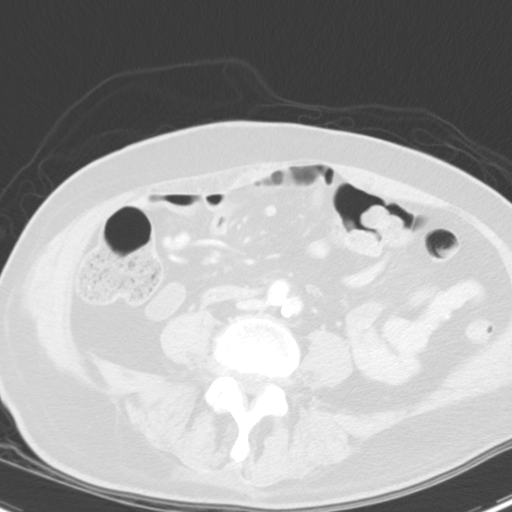
[im 52/115  lung]
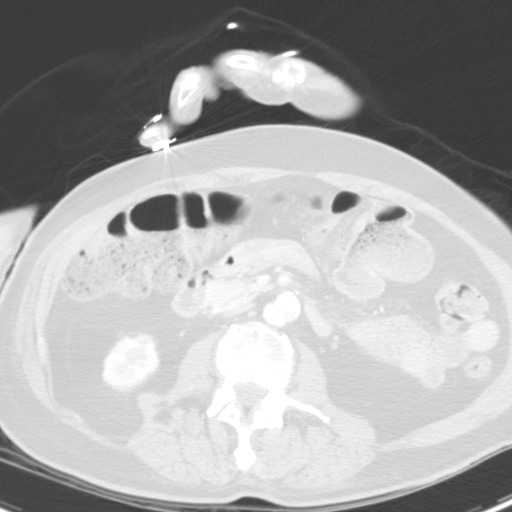
[im 63/115  mediastinal]
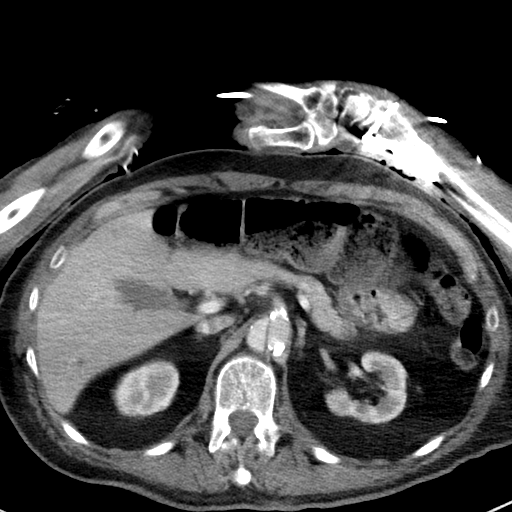
[im 63/115  lung]
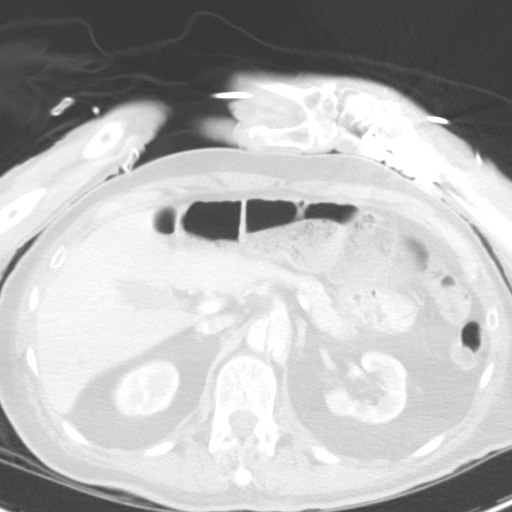
[im 73/115  lung]
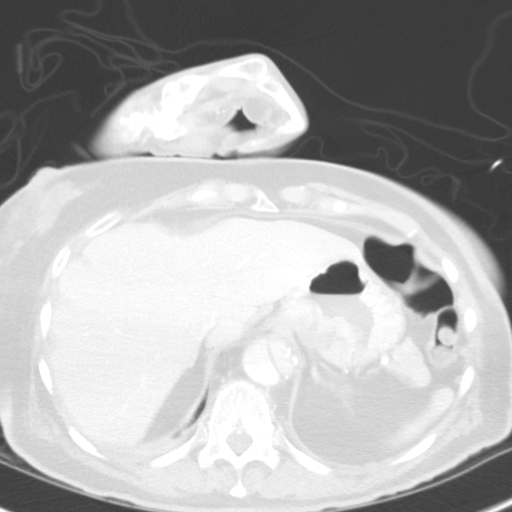
[im 94/115  lung]
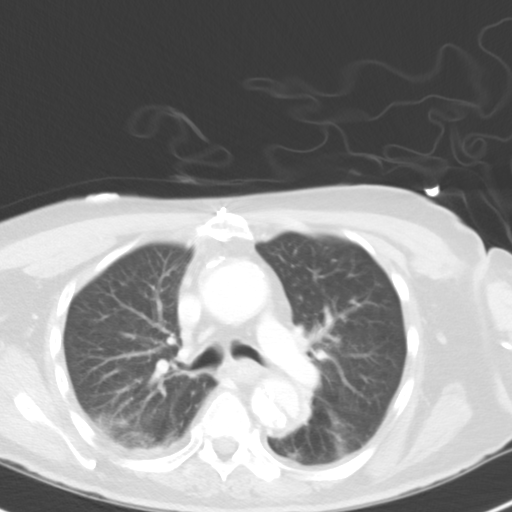
[im 104/115  lung]
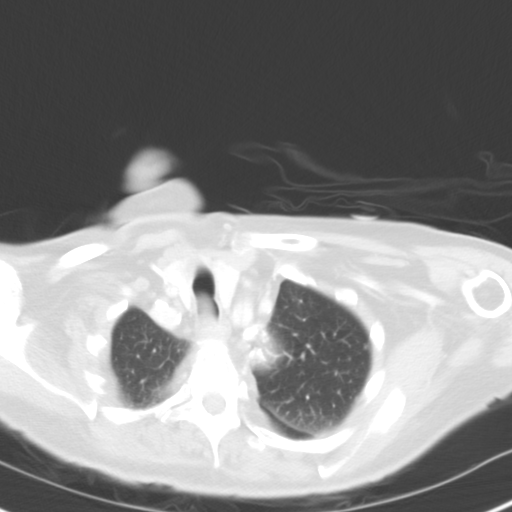

[Series 7: cap with 3mm st cor · coronal · 0.61mm/px · 3 of 151 slices shown]
[im 31/151  lung]
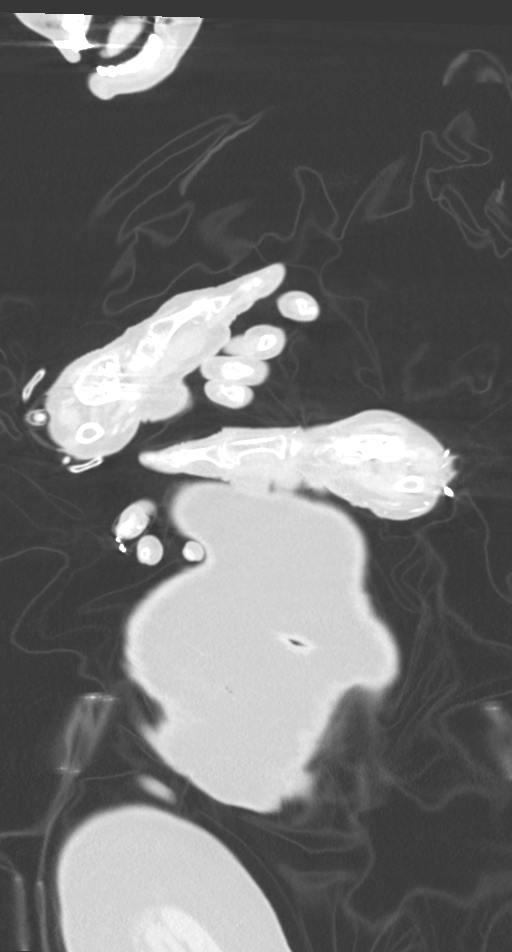
[im 61/151  lung]
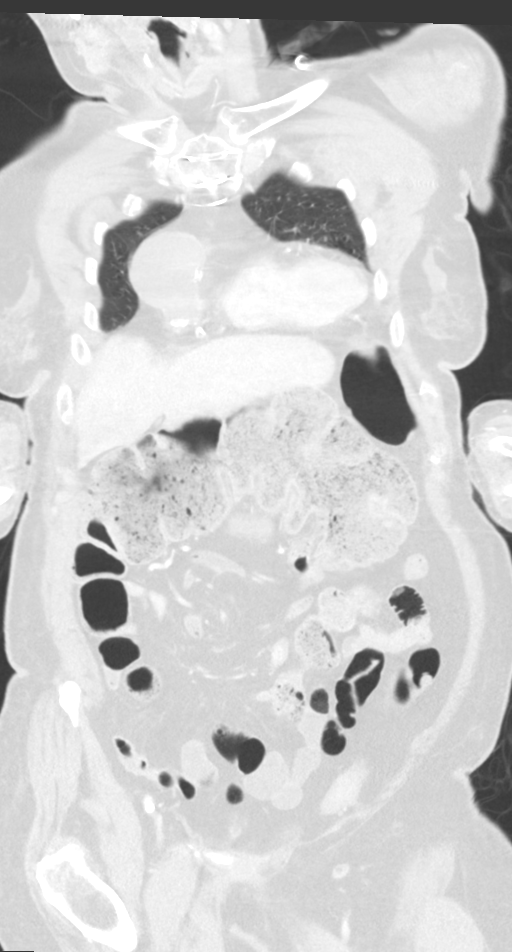
[im 91/151  lung]
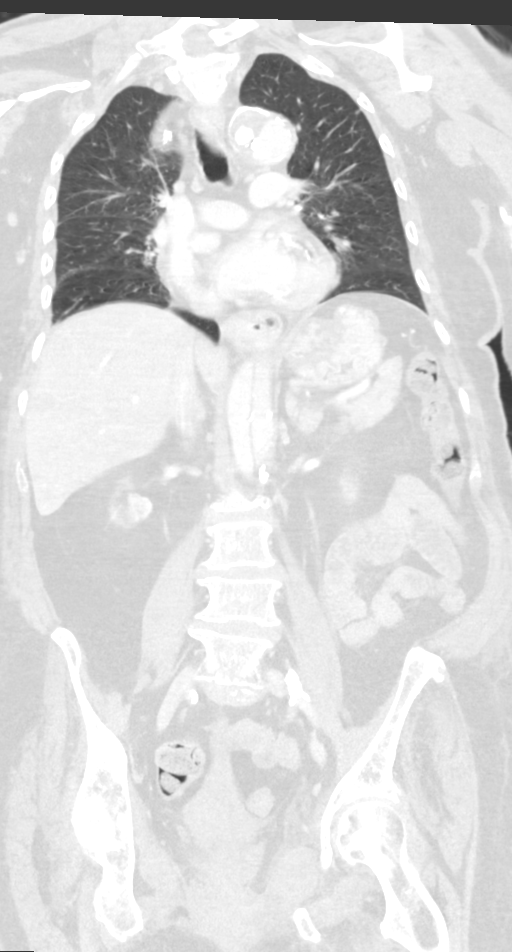

[11 of 36 positions shown; findings below may reference images not displayed]

FINDINGS: CT CHEST FINDINGS

Cardiovascular: There is a large complex aneurysm involving the
ascending thoracic aorta. This appears to represent a large pseudo
aneurysm arising near the sinotubular junction. This complex
aneurysm has a maximum size of 6.5 cm in the craniocaudal dimension
measures 4.7 cm in transverse dimension. Aneurysm contains a large
amount of thrombus and some calcifications. Prior median sternotomy
and the ascending thoracic aorta has likely been replaced with a
surgical anastomosis on sequence 3, image 20 near the proximal
aortic arch. The ascending thoracic aorta which probably represents
a graft appears to be diffusely enlarged measuring up to 4.7 cm. No
evidence for acute hemorrhage or active contrast extravasation.
Aortic dissection at the aortic arch that extends into the
descending thoracic aorta. Dissection flap involves the left common
carotid artery. Limited evaluation of the great vessels on this non
CT examination. Dissection also appears to involve the right common
carotid artery. Proximal descending thoracic aorta measures 4.3 cm.
There appears to be high-density contrast within the false lumen.
Coronary arteries are heavily calcified.

Mediastinum/Nodes: Small hiatal hernia and suspect wall thickening
in the distal esophagus which is nonspecific.

Lungs/Pleura: Trachea and mainstem bronchi are patent. Volume loss
and atelectasis in the lower lobes. No large pleural effusions. Few
subtle peripheral densities in left upper lobe on sequence 4, image
61 and 60 are nonspecific. Otherwise, no significant airspace
disease or consolidation.

Musculoskeletal: Trachea and mainstem bronchi are patent.

CT ABDOMEN PELVIS FINDINGS

Hepatobiliary: Normal appearance of the liver, gallbladder and
portal venous system.

Pancreas: Normal appearance of the pancreas without inflammation or
duct dilatation.

Spleen: Normal appearance of spleen without enlargement.

Adrenals/Urinary Tract: Normal adrenal glands. Left renal cyst.
Negative for hydronephrosis. No suspicious renal lesions. Urinary
bladder is unremarkable.

Stomach/Bowel: Hiatal hernia. A massive amount stool in the
transverse colon. No evidence for bowel obstruction or focal bowel
inflammation.

Vascular/Lymphatic: Aortic dissection extends into the abdominal
aorta. The true lumen is heavily calcified. The celiac trunk and SMA
appear to originate from the true lumen. Right renal artery probably
originates from the false lumen. The true lumen is heavily calcified
distally with stenosis. Difficult to evaluate the dissection in the
common iliac arteries. The iliac arteries are heavily calcified. No
significant lymph node enlargement in the abdomen or pelvis.

Reproductive: Status post hysterectomy. No adnexal masses.

Other: No free fluid.  Negative for free air.

Musculoskeletal: Large Schmorl's node involving the superior
endplate of T12. No suspicious bone findings.
IMPRESSION: Large complex pseudoaneurysm involving the ascending thoracic aorta,
presumably involving a surgical graft. This complex aneurysm
contains a large amount of thrombus and measures up to 6.5 cm. No
evidence for rupture or active bleeding.

Residual aortic dissection involving the aortic arch, descending
thoracic aorta and abdominal aorta. Limited evaluation of this
dissection on this non CTA examination.

No CT evidence to clearly explain fever of unknown origin. Volume
loss in lungs with a few peripheral densities in left upper lobe
that are nonspecific.

Large amount of stool in the transverse colon.

These results were called by telephone at the time of interpretation
on 08/16/2017 at [DATE] to Dr. SEON NOLEN , who verbally
acknowledged these results.
